# Patient Record
Sex: Female | Born: 1970
Health system: Southern US, Community
[De-identification: ages and names within clinical notes are randomized; demographics above are authoritative.]

## PROBLEM LIST (undated history)

## (undated) DIAGNOSIS — Z79818 Long term (current) use of other agents affecting estrogen receptors and estrogen levels: Secondary | ICD-10-CM

## (undated) DIAGNOSIS — Z8489 Family history of other specified conditions: Secondary | ICD-10-CM

## (undated) DIAGNOSIS — D580 Hereditary spherocytosis: Secondary | ICD-10-CM

## (undated) DIAGNOSIS — D649 Anemia, unspecified: Secondary | ICD-10-CM

## (undated) DIAGNOSIS — Z923 Personal history of irradiation: Secondary | ICD-10-CM

## (undated) DIAGNOSIS — R7303 Prediabetes: Secondary | ICD-10-CM

## (undated) DIAGNOSIS — R0600 Dyspnea, unspecified: Secondary | ICD-10-CM

## (undated) DIAGNOSIS — Z5189 Encounter for other specified aftercare: Secondary | ICD-10-CM

## (undated) DIAGNOSIS — C50911 Malignant neoplasm of unspecified site of right female breast: Secondary | ICD-10-CM

## (undated) DIAGNOSIS — Z9221 Personal history of antineoplastic chemotherapy: Secondary | ICD-10-CM

## (undated) DIAGNOSIS — Z973 Presence of spectacles and contact lenses: Secondary | ICD-10-CM

## (undated) DIAGNOSIS — E079 Disorder of thyroid, unspecified: Secondary | ICD-10-CM

## (undated) DIAGNOSIS — Z79811 Long term (current) use of aromatase inhibitors: Secondary | ICD-10-CM

## (undated) DIAGNOSIS — R55 Syncope and collapse: Secondary | ICD-10-CM

## (undated) HISTORY — DX: Hereditary spherocytosis: D58.0

## (undated) HISTORY — DX: Anemia, unspecified: D64.9

## (undated) HISTORY — DX: Encounter for other specified aftercare: Z51.89

## (undated) HISTORY — PX: BREAST SURGERY: SHX581

## (undated) HISTORY — PX: WISDOM TOOTH EXTRACTION: SHX21

## (undated) HISTORY — DX: Disorder of thyroid, unspecified: E07.9

---
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
## 2000-04-15 ENCOUNTER — Emergency Department (HOSPITAL_COMMUNITY): Admission: EM | Admit: 2000-04-15 | Discharge: 2000-04-16 | Payer: Self-pay | Admitting: Emergency Medicine

## 2000-04-15 ENCOUNTER — Encounter: Payer: Self-pay | Admitting: Emergency Medicine

## 2012-09-02 DIAGNOSIS — C50911 Malignant neoplasm of unspecified site of right female breast: Secondary | ICD-10-CM

## 2012-09-02 HISTORY — DX: Malignant neoplasm of unspecified site of right female breast: C50.911

## 2012-09-06 ENCOUNTER — Other Ambulatory Visit: Payer: Self-pay | Admitting: Radiology

## 2012-09-07 ENCOUNTER — Other Ambulatory Visit: Payer: Self-pay | Admitting: Radiology

## 2012-09-07 DIAGNOSIS — C50911 Malignant neoplasm of unspecified site of right female breast: Secondary | ICD-10-CM

## 2012-09-08 ENCOUNTER — Telehealth: Payer: Self-pay | Admitting: *Deleted

## 2012-09-08 DIAGNOSIS — C50511 Malignant neoplasm of lower-outer quadrant of right female breast: Secondary | ICD-10-CM

## 2012-09-08 NOTE — Telephone Encounter (Signed)
Called and spoke with patient and confirmed [facility] appt. For 09/14/12 at 12N.  Instructions and contact information given.  No questions or concerns at this time.

## 2012-09-09 ENCOUNTER — Ambulatory Visit
Admission: RE | Admit: 2012-09-09 | Discharge: 2012-09-09 | Disposition: A | Discharge status: Home / Self Care | Payer: BC Managed Care – PPO | Source: Ambulatory Visit | Attending: Radiology | Admitting: Radiology

## 2012-09-09 DIAGNOSIS — C50911 Malignant neoplasm of unspecified site of right female breast: Secondary | ICD-10-CM

## 2012-09-09 MED ORDER — GADOBENATE DIMEGLUMINE 529 MG/ML IV SOLN
20.0000 mL | Freq: Once | INTRAVENOUS | Status: AC | PRN
  Administered 2012-09-09: 20 mL via INTRAVENOUS

## 2012-09-14 ENCOUNTER — Encounter: Payer: Self-pay | Admitting: Radiation Oncology

## 2012-09-14 ENCOUNTER — Encounter: Payer: Self-pay | Admitting: *Deleted

## 2012-09-14 ENCOUNTER — Other Ambulatory Visit (HOSPITAL_BASED_OUTPATIENT_CLINIC_OR_DEPARTMENT_OTHER): Payer: BC Managed Care – PPO | Admitting: Lab

## 2012-09-14 ENCOUNTER — Encounter: Payer: Self-pay | Admitting: Oncology

## 2012-09-14 ENCOUNTER — Ambulatory Visit (HOSPITAL_BASED_OUTPATIENT_CLINIC_OR_DEPARTMENT_OTHER): Payer: BC Managed Care – PPO | Admitting: General Surgery

## 2012-09-14 ENCOUNTER — Telehealth: Payer: Self-pay | Admitting: *Deleted

## 2012-09-14 ENCOUNTER — Ambulatory Visit (HOSPITAL_BASED_OUTPATIENT_CLINIC_OR_DEPARTMENT_OTHER): Payer: BC Managed Care – PPO | Admitting: Oncology

## 2012-09-14 ENCOUNTER — Ambulatory Visit: Payer: BC Managed Care – PPO

## 2012-09-14 ENCOUNTER — Encounter (INDEPENDENT_AMBULATORY_CARE_PROVIDER_SITE_OTHER): Payer: Self-pay | Admitting: General Surgery

## 2012-09-14 ENCOUNTER — Ambulatory Visit
Admission: RE | Admit: 2012-09-14 | Discharge: 2012-09-14 | Disposition: A | Discharge status: Home / Self Care | Payer: BC Managed Care – PPO | Source: Ambulatory Visit | Attending: Radiation Oncology | Admitting: Radiation Oncology

## 2012-09-14 VITALS — BP 133/77 | HR 68 | Temp 98.4°F | Resp 20 | Ht 64.5 in | Wt 208.2 lb

## 2012-09-14 DIAGNOSIS — C50511 Malignant neoplasm of lower-outer quadrant of right female breast: Secondary | ICD-10-CM

## 2012-09-14 DIAGNOSIS — C50519 Malignant neoplasm of lower-outer quadrant of unspecified female breast: Secondary | ICD-10-CM

## 2012-09-14 DIAGNOSIS — Z170 Estrogen receptor positive status [ER+]: Secondary | ICD-10-CM

## 2012-09-14 DIAGNOSIS — C50919 Malignant neoplasm of unspecified site of unspecified female breast: Secondary | ICD-10-CM

## 2012-09-14 DIAGNOSIS — D649 Anemia, unspecified: Secondary | ICD-10-CM

## 2012-09-14 LAB — CBC WITH DIFFERENTIAL/PLATELET
BASO%: 0.4 % (ref 0.0–2.0)
Basophils Absolute: 0.1 10*3/uL (ref 0.0–0.1)
EOS%: 1.2 % (ref 0.0–7.0)
Eosinophils Absolute: 0.2 10*3/uL (ref 0.0–0.5)
HCT: 30.3 % — ABNORMAL LOW (ref 34.8–46.6)
HGB: 10.5 g/dL — ABNORMAL LOW (ref 11.6–15.9)
LYMPH%: 16.5 % (ref 14.0–49.7)
MCH: 27.7 pg (ref 25.1–34.0)
MCHC: 34.8 g/dL (ref 31.5–36.0)
MCV: 79.7 fL (ref 79.5–101.0)
MONO#: 0.7 10*3/uL (ref 0.1–0.9)
MONO%: 4.8 % (ref 0.0–14.0)
NEUT#: 11.5 10*3/uL — ABNORMAL HIGH (ref 1.5–6.5)
NEUT%: 77.1 % — ABNORMAL HIGH (ref 38.4–76.8)
Platelets: 270 10*3/uL (ref 145–400)
RBC: 3.8 10*6/uL (ref 3.70–5.45)
RDW: 21.3 % — ABNORMAL HIGH (ref 11.2–14.5)
WBC: 14.9 10*3/uL — ABNORMAL HIGH (ref 3.9–10.3)
lymph#: 2.5 10*3/uL (ref 0.9–3.3)

## 2012-09-14 LAB — COMPREHENSIVE METABOLIC PANEL (CC13)
ALT: 11 U/L (ref 0–55)
AST: 12 U/L (ref 5–34)
Albumin: 3.6 g/dL (ref 3.5–5.0)
Alkaline Phosphatase: 79 U/L (ref 40–150)
BUN: 7.8 mg/dL (ref 7.0–26.0)
CO2: 24 mEq/L (ref 22–29)
Calcium: 8.9 mg/dL (ref 8.4–10.4)
Chloride: 110 mEq/L — ABNORMAL HIGH (ref 98–109)
Creatinine: 0.7 mg/dL (ref 0.6–1.1)
Glucose: 95 mg/dl (ref 70–140)
Potassium: 4 mEq/L (ref 3.5–5.1)
Sodium: 142 mEq/L (ref 136–145)
Total Bilirubin: 0.93 mg/dL (ref 0.20–1.20)
Total Protein: 7.6 g/dL (ref 6.4–8.3)

## 2012-09-14 MED ORDER — LIDOCAINE-PRILOCAINE 2.5-2.5 % EX CREA: TOPICAL_CREAM | CUTANEOUS | Status: DC | PRN

## 2012-09-14 MED ORDER — ONDANSETRON HCL 8 MG PO TABS: 8.0000 mg | ORAL_TABLET | Freq: Two times a day (BID) | ORAL | Status: DC | PRN

## 2012-09-14 MED ORDER — LORAZEPAM 0.5 MG PO TABS: 0.5000 mg | ORAL_TABLET | Freq: Four times a day (QID) | ORAL | Status: DC | PRN

## 2012-09-14 MED ORDER — PROCHLORPERAZINE MALEATE 10 MG PO TABS: 10.0000 mg | ORAL_TABLET | Freq: Four times a day (QID) | ORAL | Status: DC | PRN

## 2012-09-14 MED ORDER — DEXAMETHASONE 4 MG PO TABS: ORAL_TABLET | ORAL | Status: DC

## 2012-09-14 NOTE — Telephone Encounter (Signed)
appts made and printed. Pt is aware that cs will call her with her PET scan appt. She is also aware that i emailed MW to add her tx's. i informed the pt that i needed a time slot for her to see kk on 9/5. i emailed kk asking for her to advise me. i made the pt aware that once her echo is pre cert i will call her w/ an appt d/t. i placed the order on [facility] desk for pre cert.

## 2012-09-14 NOTE — Progress Notes (Signed)
Radiation Oncology         [phone]) [phone] ________________________________  Initial outpatient Consultation  Name: Mary Dougherty MRN: [id number]  Date: 09/14/2012  DOB: 1970-08-20  CC:No PCP Per Patient  Emelia Loron, MD   REFERRING PHYSICIAN: Emelia Loron, MD  DIAGNOSIS: Clinical T2, N0, M0 ER/PR positive HER-2/neu negative grade 2 invasive ductal carcinoma, right breast  HISTORY OF PRESENT ILLNESS::Mary Dougherty is a 42 y.o. female who presented with presented with a 2.0 cm right breast mass on mammography. This measured 1.4 cm on ultrasound. Biopsy on 09/06/2012 demonstrated ER/PR positive HER-2/neu negative grade 2 invasive ductal carcinoma. MRI was performed of her breasts on 09/09/2012, demonstrating a solitary 2.7 cm mass in the right breast.  She is otherwise in her usual state of health.  PREVIOUS RADIATION THERAPY: No  PAST MEDICAL HISTORY:  has a past medical history of Anemia and Thyroid disease.    PAST SURGICAL HISTORY: Past Surgical History  Procedure Laterality Date  . Wisdom tooth extraction      FAMILY HISTORY: family history is not on file.  SOCIAL HISTORY:  reports that she has never smoked. She does not have any smokeless tobacco history on file. She reports that she does not drink alcohol or use illicit drugs.  ALLERGIES: Review of patient's allergies indicates no known allergies.  MEDICATIONS:  Current Outpatient Prescriptions  Medication Sig Dispense Refill  . dexamethasone (DECADRON) 4 MG tablet Take 2 tablets by mouth once a day on the day after chemotherapy and then take 2 tablets two times a day for 2 days. Take with food.  30 tablet  1  . ibuprofen (ADVIL,MOTRIN) 200 MG tablet Take 200 mg by mouth as needed for pain.      Marland Kitchen lidocaine-prilocaine (EMLA) cream Apply topically as needed.  30 g  6  . LORazepam (ATIVAN) 0.5 MG tablet Take 1 tablet (0.5 mg total) by mouth every 6 (six) hours as needed (Nausea or vomiting).  30  tablet  0  . ondansetron (ZOFRAN) 8 MG tablet Take 1 tablet (8 mg total) by mouth 2 (two) times daily as needed. Take two times a day as needed for nausea or vomiting starting on the third day after chemotherapy.  30 tablet  1  . Prenatal Vit-Fe Fumarate-FA (PRENATAL MULTIVITAMIN) TABS tablet Take 1 tablet by mouth daily at 12 noon.      . prochlorperazine (COMPAZINE) 10 MG tablet Take 1 tablet (10 mg total) by mouth every 6 (six) hours as needed (Nausea or vomiting).  30 tablet  1   No current facility-administered medications for this encounter.    REVIEW OF SYSTEMS: As above   PHYSICAL EXAM: Vitals with Age-Percentiles 09/14/2012  Length 163.8 cm  Systolic 133  Diastolic 77  Pulse 68  Respiration 20  Weight 94.439 kg  VISIT REPORT   General: Alert and oriented, in no acute distress HEENT: Head is normocephalic. Extraocular movements are intact.  Neck: Neck is supple, no palpable cervical or supraclavicular lymphadenopathy. Heart: Regular in rate and rhythm with no murmurs, rubs, or gallops. Chest: Rhonchi  in left upper chest - otherwise clear Abdomen: Soft, nontender, nondistended, with no rigidity or guarding. Extremities: No cyanosis or edema. Lymphatics: No concerning lymphadenopathy. Skin: No concerning lesions. Musculoskeletal: symmetric strength and muscle tone throughout. Neurologic: Cranial nerves II through XII are grossly intact. No obvious focalities. Speech is fluent. Coordination is intact. Psychiatric: Judgment and insight are intact. Affect is appropriate. Breasts: Notable for a palpable abnormality in  the lower outer quadrant of the right breast. This is approximately 2-3 cm in size - some of it could be post biopsy change. No palpable lesions in either breasts otherwise or in axillary regions.    LABORATORY DATA:  Lab Results  Component Value Date   WBC 14.9* 09/14/2012   HGB 10.5* 09/14/2012   HCT 30.3* 09/14/2012   MCV 79.7 09/14/2012   PLT 270 09/14/2012    CMP     Component Value Date/Time   NA 142 09/14/2012 1219         RADIOGRAPHY: Mr Breast Bilateral W Wo Contrast  09/09/2012   *RADIOLOGY REPORT*  Clinical Data:Newly-diagnosed right breast cancer six o'clock location  BILATERAL BREAST MRI WITH AND WITHOUT CONTRAST  Technique: Multiplanar, multisequence MR images of both breasts were obtained prior to and following the intravenous administration of 20ml of Multihance.  THREE-DIMENSIONAL MR IMAGE RENDERING ON INDEPENDENT WORKSTATION: Three-dimensional MR images were rendered by post-processing  of the original MR data on an independent DynaCad workstation. The three-dimensional MR images were interpreted, and findings are reported in the following complete MRI report for this study.  Comparison:  [facility] exams  FINDINGS:  Breast composition:  b:  There are scattered areas of fibroglandular density.  Background parenchymal enhancement: Mild to moderate  Right breast:  In the right breast six o'clock location, there is an irregular 2.7 x 2.3 x 1.6 cm mass with plateau type enhancement kinetics demonstrating internal clip artifact, corresponding to the biopsied invasive ductal carcinoma.  Allowing for moderate enhancement pattern, no other area of abnormal enhancement is identified.  Left breast:  No mass or abnormal enhancement.  Lymph nodes:  No abnormal appearing lymph nodes.  Ancillary findings:  None.  IMPRESSION: Solitary irregular enhancing right breast mass 6 o'clock location corresponding to biopsy-proven invasive ductal carcinoma.  No MRI evidence for multifocal/multicentric or contralateral malignancy.  RECOMMENDATION: Treatment plan  BI-RADS CATEGORY 6:  Known biopsy-proven malignancy - appropriate action should be taken.   Original Report Authenticated By: Christiana Pellant, M.D.      IMPRESSION/PLAN: She has been discussed at our multidisciplinary tumor board. The consensus is that she would be a good candidate for breast conservation if  her disease shrinks down adequately with neoadjuvant chemotherapy. I talked to her about the option of a mastectomy and informed her that her expected overall survival would be equivalent between mastectomy and breast conservation, based upon randomized controlled data. She is enthusiastic about breast conservation.   The tentative plan is for her to undergo neoadjuvant chemotherapy, followed by breast conservation surgery (if feasible), followed by radiotherapy. A referral will be made to genetics.  It was a pleasure meeting the patient today. We discussed the risks, benefits, and side effects of radiotherapy. This is a targeted treatment to decrease the risk of a recurrence in the breast tissue by about two thirds . We discussed that radiation would take approximately 6 weeks to complete. We spoke about acute effects including skin irritation and fatigue as well as much less common late effects including lung irritation. We spoke about the latest technology that is used to minimize the risk of late effects for breast cancer patients undergoing radiotherapy. No guarantees of treatment were given. The patient is enthusiastic about proceeding with treatment. I look forward to participating in the patient's care.    I spent 25 minutes  face to face with the patient and more than 50% of that time was spent in counseling and/or coordination of care.  __________________________________________   Lonie Peak, MD

## 2012-09-14 NOTE — Telephone Encounter (Signed)
Per staff message and POF I have scheduled appts.  JMW  

## 2012-09-14 NOTE — Progress Notes (Signed)
Patient ID: Mary Dougherty, female   DOB: 02/11/1970, 42 y.o.   MRN: [id number]  Chief Complaint  Patient presents with  . Other    HPI Mary Dougherty is a 42 y.o. female.  Referred by Dr Rick Cornella HPI  42 yof who lives in [place] who palpated a right breast mass on her self exam.  Mary Dougherty underwent mm that showed a right breast abnormality that was followed by u/s showing a 1.4 cm right breast mass. MRI then showed 2.7x2.3.1.6 cm mass with no other abnormalities.  Biopsy showed a grade I IDC that is er/pr positive, her2 negative and Ki is 70%.  Mary Dougherty comes in today to discuss her options.  Mary Dougherty has no real complaints.  Past Medical History  Diagnosis Date  . Anemia   . Thyroid disease     Past Surgical History  Procedure Laterality Date  . Wisdom tooth extraction      History reviewed. No pertinent family history.  Social History History  Substance Use Topics  . Smoking status: Never Smoker   . Smokeless tobacco: Not on file  . Alcohol Use: No    No Known Allergies  Current Outpatient Prescriptions  Medication Sig Dispense Refill  . dexamethasone (DECADRON) 4 MG tablet Take 2 tablets by mouth once a day on the day after chemotherapy and then take 2 tablets two times a day for 2 days. Take with food.  30 tablet  1  . ibuprofen (ADVIL,MOTRIN) 200 MG tablet Take 200 mg by mouth as needed for pain.      . lidocaine-prilocaine (EMLA) cream Apply topically as needed.  30 g  6  . LORazepam (ATIVAN) 0.5 MG tablet Take 1 tablet (0.5 mg total) by mouth every 6 (six) hours as needed (Nausea or vomiting).  30 tablet  0  . ondansetron (ZOFRAN) 8 MG tablet Take 1 tablet (8 mg total) by mouth 2 (two) times daily as needed. Take two times a day as needed for nausea or vomiting starting on the third day after chemotherapy.  30 tablet  1  . Prenatal Vit-Fe Fumarate-FA (PRENATAL MULTIVITAMIN) TABS tablet Take 1 tablet by mouth daily at 12 noon.      . prochlorperazine  (COMPAZINE) 10 MG tablet Take 1 tablet (10 mg total) by mouth every 6 (six) hours as needed (Nausea or vomiting).  30 tablet  1   No current facility-administered medications for this visit.    Review of Systems Review of Systems  Constitutional: Negative for fever, chills and unexpected weight change.  HENT: Negative for hearing loss, congestion, sore throat, trouble swallowing and voice change.   Eyes: Negative for visual disturbance.  Respiratory: Negative for cough and wheezing.   Cardiovascular: Negative for chest pain, palpitations and leg swelling.  Gastrointestinal: Negative for nausea, vomiting, abdominal pain, diarrhea, constipation, blood in stool, abdominal distention and anal bleeding.  Genitourinary: Negative for hematuria, vaginal bleeding and difficulty urinating.  Musculoskeletal: Negative for arthralgias.  Skin: Negative for rash and wound.  Neurological: Negative for seizures, syncope and headaches.  Hematological: Negative for adenopathy. Does not bruise/bleed easily.  Psychiatric/Behavioral: Negative for confusion.    There were no vitals taken for this visit.  Physical Exam Physical Exam  Vitals reviewed. Constitutional: Mary Dougherty appears well-developed and well-nourished.  Eyes: No scleral icterus.  Neck: Neck supple.  Cardiovascular: Normal rate, regular rhythm and normal heart sounds.   Pulmonary/Chest: Effort normal and breath sounds normal. Mary Dougherty has no wheezes. Mary Dougherty has no rales.   Right breast exhibits mass. Right breast exhibits no inverted nipple, no nipple discharge, no skin change and no tenderness. Left breast exhibits no inverted nipple, no mass, no nipple discharge, no skin change and no tenderness.    Abdominal: Soft. There is no tenderness.  Lymphadenopathy:    Mary Dougherty has no cervical adenopathy.    Data Reviewed BILATERAL BREAST MRI WITH AND WITHOUT CONTRAST  Technique: Multiplanar, multisequence MR images of both breasts  were obtained prior to and  following the intravenous administration  of 20ml of Multihance.  THREE-DIMENSIONAL MR IMAGE RENDERING ON INDEPENDENT WORKSTATION:  Three-dimensional MR images were rendered by post-processing of  the original MR data on an independent DynaCad workstation. The  three-dimensional MR images were interpreted, and findings are  reported in the following complete MRI report for this study.  Comparison: [facility] exams  FINDINGS:  Breast composition: b: There are scattered areas of  fibroglandular density.  Background parenchymal enhancement: Mild to moderate  Right breast: In the right breast six o'clock location, there is  an irregular 2.7 x 2.3 x 1.6 cm mass with plateau type enhancement  kinetics demonstrating internal clip artifact, corresponding to the  biopsied invasive ductal carcinoma. Allowing for moderate  enhancement pattern, no other area of abnormal enhancement is  identified.  Left breast: No mass or abnormal enhancement.  Lymph nodes: No abnormal appearing lymph nodes.  Ancillary findings: None.  IMPRESSION:  Solitary irregular enhancing right breast mass 6 o'clock location  corresponding to biopsy-proven invasive ductal carcinoma. No MRI  evidence for multifocal/multicentric or contralateral malignancy.    Assessment    Right breast cancer     Plan    Primary chemotherapy, port placement    Mary Dougherty is close to being lumpectomy candidate now but I think we could improve our ability to perform breast conservation therapy and give her better results and possibly less likelihood of positive margins by going with primary systemic therapy first.  Mary Dougherty is agreeable to that.  We discussed port placment along with risks/benefits of that.  We will plan on mr at end of therapy and then surgery about 3 weeks after that. We discussed the staging and pathophysiology of breast cancer. We discussed all of the different options for treatment for breast cancer including surgery,  chemotherapy, radiation therapy, Herceptin, and antiestrogen therapy.   We discussed a sentinel lymph node biopsy at the time of surgery as Mary Dougherty does not appear to having lymph node involvement right now. We discussed the performance of that with injection of radioactive tracer and blue dye. We discussed that Mary Dougherty would have an incision underneath her axillary hairline. We discussed that there is a chance of having a positive node with a sentinel lymph node biopsy and we will await the permanent pathology to make any other first further decisions in terms of her treatment. One of these options might be to return to the operating room to perform an axillary lymph node dissection. We discussed about a 1-2% risk lifetime of chronic shoulder pain as well as lymphedema associated with a sentinel lymph node biopsy.  We discussed the options for treatment of the breast cancer which included lumpectomy versus a mastectomy. We discussed the performance of the lumpectomy with a wire placement. We discussed a 10-20% chance of a positive margin requiring reexcision in the operating room. We also discussed that Mary Dougherty may need radiation therapy or antiestrogen therapy or both if Mary Dougherty undergoes lumpectomy. We discussed the mastectomy and the postoperative care for that   as well. We discussed that there is no difference in her survival whether Mary Dougherty undergoes lumpectomy with radiation therapy or antiestrogen therapy versus a mastectomy. There is a slight difference in the local recurrence rate being 3-5% with lumpectomy and about 1% with a mastectomy. I do think we can do breast conservation therapy better with primary systemic therapy with no downside or detrimental effect. We discussed the risks of operation including bleeding, infection, possible reoperation. Mary Dougherty understands her further therapy will be based on what her stages at the time of her operation.     Mary Dougherty 09/14/2012, 4:42 PM    

## 2012-09-14 NOTE — Progress Notes (Signed)
Checked in new pt with no financial concerns. °

## 2012-09-14 NOTE — Progress Notes (Signed)
Mary Dougherty [id number] 08/14/1970 42 y.o. 09/14/2012 3:12 PM  CC  No PCP Per Patient [address] [place] [address] Dr. Emelia Loron Dr. Lonie Peak  REASON FOR CONSULTATION:  42 year old female with screening detected in the right invasive ductal carcinoma. Patient is being seen in the multidisciplinary breast clinic for discussion of treatment options.  STAGE:   Breast cancer of lower-outer quadrant of right female breast   Primary site: Breast (Right)   Staging method: AJCC 7th Edition   Clinical: Stage IIA (T2, N0, cM0)   Summary: Stage IIA (T2, N0, cM0)  REFERRING PHYSICIAN: Dr. Emelia Loron  HISTORY OF PRESENT ILLNESS:  Mary Dougherty is a 42 y.o. female.  Would medical history significant for anemia. Patient went for her initial screening mammogram at the insistence of her sister. She was found to have a mass in the right breast. The ultrasound demonstrated a 1.4 cm area of concern. She had an MRI performed that revealed a 2.7 cm irregular area at the 6:00 location. There were no masses in the left breast no abnormal lymph nodes. Patient had a biopsy performed of the right breast that revealed an intermediate to high-grade invasive ductal carcinoma the tumor was ER +99% PR +100% HER-2/neu negative with a Ki-67 of 70%. Patient's case was discussed at the multidisciplinary breast clinic and conference. She was seen by Dr. Dwain Sarna and Dr. Lonie Peak as well. Her radiology and pathology were discussed. She is without any complaints   Past Medical History: Past Medical History  Diagnosis Date  . Anemia   . Thyroid disease     Past Surgical History: Past Surgical History  Procedure Laterality Date  . Wisdom tooth extraction      Family History: History reviewed. No pertinent family history.  Social History History  Substance Use Topics  . Smoking status: Never Smoker   . Smokeless tobacco: Not on file  . Alcohol Use: No     Allergies: No Known Allergies  Current Medications: Current Outpatient Prescriptions  Medication Sig Dispense Refill  . ibuprofen (ADVIL,MOTRIN) 200 MG tablet Take 200 mg by mouth as needed for pain.      . Prenatal Vit-Fe Fumarate-FA (PRENATAL MULTIVITAMIN) TABS tablet Take 1 tablet by mouth daily at 12 noon.       No current facility-administered medications for this visit.    OB/GYN History:patient had menarche at age 1 she and her last menstrual cycle was on 08/26/2012. She is nulliparous  Fertility Discussion: not applicable Prior History of Cancer: no  Health Maintenance:  Colonoscopy no Bone Density  Last PAP smearyes unknown  ECOG PERFORMANCE STATUS: 0 - Asymptomatic  Genetic Counseling/testing: yes  REVIEW OF SYSTEMS:  Review of Systems  Constitutional: Negative.   HENT: Negative.   Eyes: Negative.   Respiratory: Negative.   Cardiovascular: Negative.   Gastrointestinal: Negative.   Genitourinary: Negative.   Musculoskeletal: Negative.   Skin: Negative.   Neurological: Negative.   Endo/Heme/Allergies: Negative.   Psychiatric/Behavioral: Negative.      PHYSICAL EXAMINATION: Blood pressure 133/77, pulse 68, temperature 98.4 F (36.9 C), temperature source Oral, resp. rate 20, height 5' 4.5" (1.638 m), weight 208 lb 3.2 oz (94.439 kg). Physical Exam  Constitutional: She is oriented to person, place, and time and well-developed, well-nourished, and in no distress. No distress.  HENT:  Head: Normocephalic and atraumatic.  Nose: Nose normal.  Mouth/Throat: Oropharynx is clear and moist.  Eyes: EOM are normal. Pupils are equal, round, and reactive  to light.  Neck: Normal range of motion. Neck supple. No JVD present. No thyromegaly present.  Cardiovascular: Normal rate, regular rhythm and normal heart sounds.   Pulmonary/Chest: Effort normal. No respiratory distress. She has wheezes. She has no rales. She exhibits no tenderness.  Abdominal: Soft. Bowel  sounds are normal. She exhibits no distension and no mass. There is no tenderness.  Musculoskeletal: Normal range of motion. She exhibits no edema and no tenderness.  Lymphadenopathy:    She has no cervical adenopathy.  Neurological: She is alert and oriented to person, place, and time. She has normal reflexes. Gait normal.  Skin: Skin is warm and dry. No rash noted. No pallor.  Psychiatric: Mood and affect normal.  breast examination right breast reveals palpable mass no nipple inversion retraction left breast no masses or nipple discharge     STUDIES/RESULTS: Mr Breast Bilateral W Wo Contrast  09/09/2012   *RADIOLOGY REPORT*  Clinical Data:Newly-diagnosed right breast cancer six o'clock location  BILATERAL BREAST MRI WITH AND WITHOUT CONTRAST  Technique: Multiplanar, multisequence MR images of both breasts were obtained prior to and following the intravenous administration of 20ml of Multihance.  THREE-DIMENSIONAL MR IMAGE RENDERING ON INDEPENDENT WORKSTATION: Three-dimensional MR images were rendered by post-processing  of the original MR data on an independent DynaCad workstation. The three-dimensional MR images were interpreted, and findings are reported in the following complete MRI report for this study.  Comparison:  [facility] exams  FINDINGS:  Breast composition:  b:  There are scattered areas of fibroglandular density.  Background parenchymal enhancement: Mild to moderate  Right breast:  In the right breast six o'clock location, there is an irregular 2.7 x 2.3 x 1.6 cm mass with plateau type enhancement kinetics demonstrating internal clip artifact, corresponding to the biopsied invasive ductal carcinoma.  Allowing for moderate enhancement pattern, no other area of abnormal enhancement is identified.  Left breast:  No mass or abnormal enhancement.  Lymph nodes:  No abnormal appearing lymph nodes.  Ancillary findings:  None.  IMPRESSION: Solitary irregular enhancing right breast mass 6  o'clock location corresponding to biopsy-proven invasive ductal carcinoma.  No MRI evidence for multifocal/multicentric or contralateral malignancy.  RECOMMENDATION: Treatment plan  BI-RADS CATEGORY 6:  Known biopsy-proven malignancy - appropriate action should be taken.   Original Report Authenticated By: Christiana Pellant, M.D.     LABS:    Chemistry      Component Value Date/Time   NA 142 09/14/2012 1219   K 4.0 09/14/2012 1219   CO2 24 09/14/2012 1219   BUN 7.8 09/14/2012 1219   CREATININE 0.7 09/14/2012 1219      Component Value Date/Time   CALCIUM 8.9 09/14/2012 1219   ALKPHOS 79 09/14/2012 1219   AST 12 09/14/2012 1219   ALT 11 09/14/2012 1219   BILITOT 0.93 09/14/2012 1219      Lab Results  Component Value Date   WBC 14.9* 09/14/2012   HGB 10.5* 09/14/2012   HCT 30.3* 09/14/2012   MCV 79.7 09/14/2012   PLT 270 09/14/2012   PATHOLOGY: ADDITIONAL INFORMATION: PROGNOSTIC INDICATORS - ACIS Results: IMMUNOHISTOCHEMICAL AND MORPHOMETRIC ANALYSIS BY THE AUTOMATED CELLULAR IMAGING SYSTEM (ACIS) Estrogen Receptor: 99%, POSITIVE, STRONG STAINING INTENSITY Progesterone Receptor: 100%, POSITIVE, STRONG STAINING INTENSITY Proliferation Marker Ki67: 70% REFERENCE RANGE ESTROGEN RECEPTOR NEGATIVE <1% POSITIVE =>1% PROGESTERONE RECEPTOR NEGATIVE <1% POSITIVE =>1% All controls stained appropriately Jimmy Picket MD Pathologist, Electronic Signature ( Signed 09/09/2012) CHROMOGENIC IN-SITU HYBRIDIZATION Results: HER-2/NEU BY CISH - NO AMPLIFICATION OF HER-2 DETECTED.  RESULT RATIO OF HER2: CEP 17 SIGNALS 1.14 AVERAGE HER2 COPY NUMBER PER CELL 2.45 REFERENCE RANGE 1 of 3 Duplicate copy FINAL for ALLORA, BAINS ([id number]) ADDITIONAL INFORMATION:(continued) NEGATIVE HER2/Chr17 Ratio <2.0 and Average HER2 copy number <4.0 EQUIVOCAL HER2/Chr17 Ratio <2.0 and Average HER2 copy number 4.0 and <6.0 POSITIVE HER2/Chr17 Ratio >=2.0 and/or Average HER2 copy number >=6.0 Jimmy Picket  MD Pathologist, Electronic Signature ( Signed 09/09/2012) FINAL DIAGNOSIS Diagnosis Breast, right, needle core biopsy, mass - INVASIVE DUCTAL CARCINOMA. Microscopic Comment The cores are involved by invasive ductal carcinoma consistent with grade II/III. Breast prognostic profile will be performed. Called to [employer] on 09/07/12. Dr. Colonel Bald has reviewed this case and agrees. (JDP:gt, 09/07/12) Jimmy Picket MD Pathologist, Electronic Signature (Case signed 09/07/2012) S ASSESSMENT    42 year old female with   #1 initial yearly mammogramfound to have an abnormal mass in the right breast ultrasound confirmed the mass to be 1.4 cm at the 6:00 position MRI revealed the mass to be a little bit bigger at 2.7 cm. Biopsy revealed an invasive ductal carcinoma grade 2 ER positive PR positive HER-2/neu negative with a Ki-67 of 70%.  #2 patient and I discussed her diagnosis radiology pathology. We discussed treatment options.  #3 patient does desire breast conservation we did discuss doing neoadjuvant chemotherapy initially consisting of FEC q. 2 weeks for a total of 6 cycles followed by Taxol weekly for a total of 12 weeks. Risks benefits and side effects of this treatment were discussed with the patient.  #4 patient will need antiestrogen therapy since her tumor is estrogen receptor positive. We discussed tamoxifen for 10 years.  #5 patient will also need radiation therapy she was seen by Dr. Lonie Peak appeared  Clinical Trial Eligibility:no Multidisciplinary conference discussionyes     PLAN:    #1 patient will proceed with genetic counseling and testing.  #2 she will need a Port-A-Cath placement for chemotherapy.  #3 we will do staging CT chest abdomen and pelvis to make sure there is no evidence or disease anywhere else.  #4 patient will need an echocardiogram chemotherapy teaching class.  #5 she will receive FEC dose dense every 2 weeks for a total of 6 cycles  followed by weekly Taxol for 12 weeks. We hope to start this chemotherapy in the next few weeks.        Discussion: Patient is being treated per NCCN breast cancer care guidelines appropriate for stage.II   Thank you so much for allowing me to participate in the care of [name]. I will continue to follow up the patient with you and assist in her care.  All questions were answered. The patient knows to call the clinic with any problems, questions or concerns. We can certainly see the patient much sooner if necessary.  I spent 55 minutes counseling the patient face to face. The total time spent in the appointment was 60 minutes.  Drue Second, MD Medical/Oncology [facility] [phone] (beeper) [phone] (Office)  09/14/2012, 3:12 PM

## 2012-09-15 ENCOUNTER — Telehealth (INDEPENDENT_AMBULATORY_CARE_PROVIDER_SITE_OTHER): Payer: Self-pay

## 2012-09-15 ENCOUNTER — Encounter (HOSPITAL_BASED_OUTPATIENT_CLINIC_OR_DEPARTMENT_OTHER): Payer: Self-pay | Admitting: *Deleted

## 2012-09-15 ENCOUNTER — Telehealth: Payer: Self-pay | Admitting: *Deleted

## 2012-09-15 NOTE — Telephone Encounter (Signed)
Tried to call the pt and informed her that KK gv a time slot. And to make her aware that on 10/07/12 she will come in @ 9:45am for labs, ov@10 :15am, and tx to follow. i emailed MW to adjust the tx time for that date. Pt will see these changes on Monday 09/19/12.

## 2012-09-15 NOTE — Telephone Encounter (Signed)
sw with pt gv appt for echo 09/19/12 @ 2pm. i also made her aware of her appts for 10/07/12..the patient is aware...td

## 2012-09-15 NOTE — Progress Notes (Signed)
Labs done cancer center 09/14/12-wbc up-no s/s infection-sore from bx

## 2012-09-15 NOTE — Telephone Encounter (Signed)
Called pt to introduce myself since pt was seen at the [facility] with Dr Dwain Sarna. I advised pt that our surgery schedulers are working on getting her scheduled for the [facility] next week and they should be in touch with her today. I advised pt that if she had any questions to please call our office. I gave the pt our phone number with my name.

## 2012-09-15 NOTE — Telephone Encounter (Signed)
Patient is having PAC placement on 8/19 @ 1030 by Dr Dwain Sarna and will need Chemo Ed rescheduled to a later date. Patient notified we will be changing this appt. If she does not hear from [place] tomorrow, she will check while here for appt with Maylon Cos on Monday.

## 2012-09-16 ENCOUNTER — Telehealth: Payer: Self-pay | Admitting: *Deleted

## 2012-09-16 NOTE — Telephone Encounter (Signed)
Per staff message I have adjusted 9/5 appt 

## 2012-09-16 NOTE — Telephone Encounter (Signed)
sw pt r/s her appt for 8/19 to 10/22/12 @ 10am. Pt is aware...td

## 2012-09-19 ENCOUNTER — Ambulatory Visit (HOSPITAL_COMMUNITY): Payer: BC Managed Care – PPO | Attending: Oncology | Admitting: Radiology

## 2012-09-19 ENCOUNTER — Other Ambulatory Visit: Payer: BC Managed Care – PPO

## 2012-09-19 ENCOUNTER — Telehealth: Payer: Self-pay | Admitting: *Deleted

## 2012-09-19 ENCOUNTER — Encounter: Payer: Self-pay | Admitting: Genetic Counselor

## 2012-09-19 ENCOUNTER — Ambulatory Visit (HOSPITAL_BASED_OUTPATIENT_CLINIC_OR_DEPARTMENT_OTHER): Payer: BC Managed Care – PPO | Admitting: Genetic Counselor

## 2012-09-19 DIAGNOSIS — Z808 Family history of malignant neoplasm of other organs or systems: Secondary | ICD-10-CM

## 2012-09-19 DIAGNOSIS — Z8249 Family history of ischemic heart disease and other diseases of the circulatory system: Secondary | ICD-10-CM | POA: Insufficient documentation

## 2012-09-19 DIAGNOSIS — C50919 Malignant neoplasm of unspecified site of unspecified female breast: Secondary | ICD-10-CM | POA: Insufficient documentation

## 2012-09-19 DIAGNOSIS — IMO0002 Reserved for concepts with insufficient information to code with codable children: Secondary | ICD-10-CM

## 2012-09-19 DIAGNOSIS — E669 Obesity, unspecified: Secondary | ICD-10-CM | POA: Insufficient documentation

## 2012-09-19 DIAGNOSIS — I428 Other cardiomyopathies: Secondary | ICD-10-CM

## 2012-09-19 DIAGNOSIS — C50511 Malignant neoplasm of lower-outer quadrant of right female breast: Secondary | ICD-10-CM

## 2012-09-19 DIAGNOSIS — Z803 Family history of malignant neoplasm of breast: Secondary | ICD-10-CM

## 2012-09-19 NOTE — Progress Notes (Signed)
Dr.  Drue Second requested a consultation for genetic counseling and risk assessment for Mary Dougherty, a 42 y.o. female, for discussion of her personal history of breast cancer and remote family history of melanoma and breast cancer.  She presents to clinic today to discuss the possibility of a genetic predisposition to cancer, and to further clarify her risks, as well as her family members' risks for cancer.   HISTORY OF PRESENT ILLNESS: In 2014, at the age of 49, Mary Dougherty was diagnosed with invasive ductal carcinoma of the right breast. This will be treated with lumpectomy, chemotherapy and radiation.  The tumor is ER+/PR+/Her2-.  Marland Kitchen     Past Medical History  Diagnosis Date  . Anemia   . Thyroid disease   . Wears glasses   . Breast cancer 2014    ER+/PR+/Her2-    Past Surgical History  Procedure Laterality Date  . Wisdom tooth extraction      History   Social History  . Marital Status: Single    Spouse Name: N/A    Number of Children: N/A  . Years of Education: N/A   Social History Main Topics  . Smoking status: Never Smoker   . Smokeless tobacco: None  . Alcohol Use: No  . Drug Use: No  . Sexual Activity: Not Currently   Other Topics Concern  . None   Social History Narrative  . None    REPRODUCTIVE HISTORY AND PERSONAL RISK ASSESSMENT FACTORS: Menarche was at age 15.   premenopausal Uterus Intact: no Ovaries Intact: no G0P0A0, first live birth at age N/A  She has not previously undergone treatment for infertility.   Oral Contraceptive use: 8-10 years   She has not used HRT in the past.    FAMILY HISTORY:  We obtained a detailed, 4-generation family history.  Significant diagnoses are listed below: Family History  Problem Relation Age of Onset  . Pulmonary embolism Maternal Uncle 45    died instantly  . Melanoma Cousin     paternal cousin dx in her 91s  The patient's maternal grandmother had several siblings who passed away from  smoking related cancers.  Specifically two sisters who had lung cancer, a brotehr with throat cancer and a brother with either pancreatic or colon cancer.  The patient's paternal uncle had "cancer of the ear lobe" in his 30s.  She was unsure if this was a melanoma or a cancer of the tissue of the ear lobe.  No chemo or radiation was needed.  Patient's maternal ancestors are of [place] descent, and paternal ancestors are of [place] descent. There is no reported Ashkenazi Jewish ancestry. There is no  known consanguinity.  GENETIC COUNSELING ASSESSMENT: Mary Dougherty is a 42 y.o. female with a personal history of breast cancer and remote family history of breast cancer and melanoma which somewhat suggestive of a hereditary  and predisposition to cancer. We, therefore, discussed and recommended the following at today's visit.   DISCUSSION: We reviewed the characteristics, features and inheritance patterns of hereditary cancer syndromes. We also discussed genetic testing, including the appropriate family members to test, the process of testing, insurance coverage and turn-around-time for results. We recommended Mary Dougherty pursue genetic testing for breast/ovarian cancer panel at [facility].   PLAN: After considering the risks, benefits, and limitations, Mary Dougherty provided informed consent to pursue genetic testing and the blood sample will be sent to [facility] for analysis of the Breast/ovarian cancer  panel. We discussed the implications of a positive, negative and/ or variant of uncertain significance genetic test result. Results should be available within approximately 3-4 weeks' time, at which point they will be disclosed by telephone to Mary Dougherty, as will any additional recommendations warranted by these results. Mary Dougherty will receive a summary of her genetic counseling visit and a copy of her results once available. This information will  also be available in Epic. We encouraged Mary Dougherty to remain in contact with cancer genetics annually so that we can continuously update the family history and inform her of any changes in cancer genetics and testing that may be of benefit for her family. Mary Dougherty's questions were answered to her satisfaction today. Our contact information was provided should additional questions or concerns arise.   Per the patient's request, we will contact her by telephone to discuss these results. A follow up genetic counseling visit will be scheduled if indicated.  The patient was seen for a total of 60 minutes, greater than 50% of which was spent face-to-face counseling.  This plan is being carried out per Dr. Feliz Beam recommendations.  This note will also be sent to the referring provider via the electronic medical record. The patient will be supplied with a summary of this genetic counseling discussion as well as educational information on the discussed hereditary cancer syndromes following the conclusion of their visit.   Patient was discussed with Dr. Drue Second.   _______________________________________________________________________ For Office Staff:  Number of people involved in session: 3 Was an Intern/ student involved with case: yes

## 2012-09-19 NOTE — Telephone Encounter (Signed)
Called and spoke with patient from [facility] 09/14/12. No questions or concerns at this time.   

## 2012-09-19 NOTE — Progress Notes (Signed)
Echocardiogram performed.  

## 2012-09-20 ENCOUNTER — Other Ambulatory Visit: Payer: BC Managed Care – PPO

## 2012-09-20 ENCOUNTER — Encounter (HOSPITAL_BASED_OUTPATIENT_CLINIC_OR_DEPARTMENT_OTHER): Payer: Self-pay

## 2012-09-20 ENCOUNTER — Ambulatory Visit (HOSPITAL_BASED_OUTPATIENT_CLINIC_OR_DEPARTMENT_OTHER): Payer: BC Managed Care – PPO | Admitting: Anesthesiology

## 2012-09-20 ENCOUNTER — Encounter (HOSPITAL_BASED_OUTPATIENT_CLINIC_OR_DEPARTMENT_OTHER): Payer: Self-pay | Admitting: Anesthesiology

## 2012-09-20 ENCOUNTER — Other Ambulatory Visit: Payer: Self-pay | Admitting: Certified Registered Nurse Anesthetist

## 2012-09-20 ENCOUNTER — Ambulatory Visit (HOSPITAL_COMMUNITY): Payer: BC Managed Care – PPO

## 2012-09-20 ENCOUNTER — Encounter (HOSPITAL_BASED_OUTPATIENT_CLINIC_OR_DEPARTMENT_OTHER)
Admission: RE | Disposition: A | Discharge status: Home / Self Care | Payer: Self-pay | Source: Ambulatory Visit | Attending: General Surgery

## 2012-09-20 ENCOUNTER — Ambulatory Visit (HOSPITAL_BASED_OUTPATIENT_CLINIC_OR_DEPARTMENT_OTHER)
Admission: RE | Admit: 2012-09-20 | Discharge: 2012-09-20 | Disposition: A | Discharge status: Home / Self Care | Payer: BC Managed Care – PPO | Source: Ambulatory Visit | Attending: General Surgery | Admitting: General Surgery

## 2012-09-20 DIAGNOSIS — C50919 Malignant neoplasm of unspecified site of unspecified female breast: Secondary | ICD-10-CM

## 2012-09-20 DIAGNOSIS — Z79899 Other long term (current) drug therapy: Secondary | ICD-10-CM | POA: Insufficient documentation

## 2012-09-20 DIAGNOSIS — Z170 Estrogen receptor positive status [ER+]: Secondary | ICD-10-CM | POA: Insufficient documentation

## 2012-09-20 DIAGNOSIS — E079 Disorder of thyroid, unspecified: Secondary | ICD-10-CM | POA: Insufficient documentation

## 2012-09-20 DIAGNOSIS — D649 Anemia, unspecified: Secondary | ICD-10-CM | POA: Insufficient documentation

## 2012-09-20 HISTORY — DX: Presence of spectacles and contact lenses: Z97.3

## 2012-09-20 HISTORY — PX: PORTACATH PLACEMENT: SHX2246

## 2012-09-20 SURGERY — INSERTION, TUNNELED CENTRAL VENOUS DEVICE, WITH PORT
Anesthesia: General | Site: Chest | Laterality: Left | Wound class: Clean

## 2012-09-20 MED ORDER — HEPARIN SOD (PORK) LOCK FLUSH 100 UNIT/ML IV SOLN
INTRAVENOUS | Status: DC | PRN
  Administered 2012-09-20: 500 [IU] via INTRAVENOUS

## 2012-09-20 MED ORDER — MIDAZOLAM HCL 2 MG/2ML IJ SOLN: 1.0000 mg | INTRAMUSCULAR | Status: DC | PRN

## 2012-09-20 MED ORDER — BUPIVACAINE HCL (PF) 0.25 % IJ SOLN
INTRAMUSCULAR | Status: DC | PRN
  Administered 2012-09-20: 10 mL

## 2012-09-20 MED ORDER — LACTATED RINGERS IV SOLN
INTRAVENOUS | Status: DC
  Administered 2012-09-20: 20 mL/h via INTRAVENOUS

## 2012-09-20 MED ORDER — FENTANYL CITRATE 0.05 MG/ML IJ SOLN
INTRAMUSCULAR | Status: DC | PRN
  Administered 2012-09-20 (×2): 50 ug via INTRAVENOUS

## 2012-09-20 MED ORDER — DEXAMETHASONE SODIUM PHOSPHATE 4 MG/ML IJ SOLN
INTRAMUSCULAR | Status: DC | PRN
  Administered 2012-09-20: 10 mg via INTRAVENOUS

## 2012-09-20 MED ORDER — FENTANYL CITRATE 0.05 MG/ML IJ SOLN: 50.0000 ug | INTRAMUSCULAR | Status: DC | PRN

## 2012-09-20 MED ORDER — MIDAZOLAM HCL 5 MG/5ML IJ SOLN
INTRAMUSCULAR | Status: DC | PRN
  Administered 2012-09-20: 2 mg via INTRAVENOUS

## 2012-09-20 MED ORDER — PROPOFOL 10 MG/ML IV BOLUS
INTRAVENOUS | Status: DC | PRN
  Administered 2012-09-20: 200 mg via INTRAVENOUS

## 2012-09-20 MED ORDER — LIDOCAINE HCL (CARDIAC) 20 MG/ML IV SOLN
INTRAVENOUS | Status: DC | PRN
  Administered 2012-09-20: 50 mg via INTRAVENOUS

## 2012-09-20 MED ORDER — OXYCODONE-ACETAMINOPHEN 10-325 MG PO TABS: 1.0000 | ORAL_TABLET | Freq: Four times a day (QID) | ORAL | Status: DC | PRN

## 2012-09-20 MED ORDER — ONDANSETRON HCL 4 MG/2ML IJ SOLN
INTRAMUSCULAR | Status: DC | PRN
  Administered 2012-09-20: 4 mg via INTRAVENOUS

## 2012-09-20 MED ORDER — HEPARIN (PORCINE) IN NACL 2-0.9 UNIT/ML-% IJ SOLN
INTRAMUSCULAR | Status: DC | PRN
  Administered 2012-09-20: 1 via INTRAVENOUS

## 2012-09-20 MED ORDER — HYDROMORPHONE HCL PF 1 MG/ML IJ SOLN
0.2500 mg | INTRAMUSCULAR | Status: DC | PRN
  Administered 2012-09-20: 0.5 mg via INTRAVENOUS

## 2012-09-20 MED ORDER — CEFAZOLIN SODIUM-DEXTROSE 2-3 GM-% IV SOLR
2.0000 g | INTRAVENOUS | Status: AC
  Administered 2012-09-20: 2 g via INTRAVENOUS

## 2012-09-20 SURGICAL SUPPLY — 49 items
ADH SKN CLS APL DERMABOND .7 (GAUZE/BANDAGES/DRESSINGS) ×1
APL SKNCLS STERI-STRIP NONHPOA (GAUZE/BANDAGES/DRESSINGS) ×1
BAG DECANTER FOR FLEXI CONT (MISCELLANEOUS) ×2 IMPLANT
BENZOIN TINCTURE PRP APPL 2/3 (GAUZE/BANDAGES/DRESSINGS) ×2 IMPLANT
BLADE SURG 11 STRL SS (BLADE) ×2 IMPLANT
BLADE SURG 15 STRL LF DISP TIS (BLADE) ×1 IMPLANT
BLADE SURG 15 STRL SS (BLADE) ×2
CANISTER SUCTION 1200CC (MISCELLANEOUS) IMPLANT
CHLORAPREP W/TINT 26ML (MISCELLANEOUS) ×2 IMPLANT
CLOTH BEACON ORANGE TIMEOUT ST (SAFETY) ×2 IMPLANT
COVER MAYO STAND STRL (DRAPES) ×2 IMPLANT
COVER TABLE BACK 60X90 (DRAPES) ×2 IMPLANT
DECANTER SPIKE VIAL GLASS SM (MISCELLANEOUS) IMPLANT
DERMABOND ADVANCED (GAUZE/BANDAGES/DRESSINGS) ×1
DERMABOND ADVANCED .7 DNX12 (GAUZE/BANDAGES/DRESSINGS) ×1 IMPLANT
DRAPE C-ARM 42X72 X-RAY (DRAPES) ×2 IMPLANT
DRAPE LAPAROSCOPIC ABDOMINAL (DRAPES) ×2 IMPLANT
DRSG TEGADERM 4X4.75 (GAUZE/BANDAGES/DRESSINGS) IMPLANT
ELECT COATED BLADE 2.86 ST (ELECTRODE) ×2 IMPLANT
ELECT REM PT RETURN 9FT ADLT (ELECTROSURGICAL) ×2
ELECTRODE REM PT RTRN 9FT ADLT (ELECTROSURGICAL) ×1 IMPLANT
GAUZE SPONGE 4X4 12PLY STRL LF (GAUZE/BANDAGES/DRESSINGS) ×2 IMPLANT
GLOVE BIO SURGEON STRL SZ7 (GLOVE) ×4 IMPLANT
GLOVE BIOGEL PI IND STRL 7.5 (GLOVE) ×1 IMPLANT
GLOVE BIOGEL PI INDICATOR 7.5 (GLOVE) ×1
GOWN PREVENTION PLUS XLARGE (GOWN DISPOSABLE) ×4 IMPLANT
IV KIT MINILOC 20X1 SAFETY (NEEDLE) IMPLANT
KIT PORT POWER 8FR ISP CVUE (Catheter) ×1 IMPLANT
NDL HYPO 25X1 1.5 SAFETY (NEEDLE) ×1 IMPLANT
NDL SAFETY ECLIPSE 18X1.5 (NEEDLE) IMPLANT
NEEDLE HYPO 18GX1.5 SHARP (NEEDLE)
NEEDLE HYPO 25X1 1.5 SAFETY (NEEDLE) ×2 IMPLANT
PACK BASIN DAY SURGERY FS (CUSTOM PROCEDURE TRAY) ×2 IMPLANT
PENCIL BUTTON HOLSTER BLD 10FT (ELECTRODE) ×2 IMPLANT
SLEEVE SCD COMPRESS KNEE MED (MISCELLANEOUS) ×2 IMPLANT
STAPLER VISISTAT 35W (STAPLE) ×2 IMPLANT
STRIP CLOSURE SKIN 1/2X4 (GAUZE/BANDAGES/DRESSINGS) ×2 IMPLANT
SUT MON AB 4-0 PC3 18 (SUTURE) ×2 IMPLANT
SUT PROLENE 2 0 SH DA (SUTURE) ×2 IMPLANT
SUT SILK 2 0 TIES 17X18 (SUTURE)
SUT SILK 2-0 18XBRD TIE BLK (SUTURE) IMPLANT
SUT VIC AB 3-0 SH 27 (SUTURE) ×2
SUT VIC AB 3-0 SH 27X BRD (SUTURE) ×1 IMPLANT
SYR 5ML LUER SLIP (SYRINGE) ×2 IMPLANT
SYR CONTROL 10ML LL (SYRINGE) ×2 IMPLANT
TOWEL OR 17X24 6PK STRL BLUE (TOWEL DISPOSABLE) ×2 IMPLANT
TOWEL OR NON WOVEN STRL DISP B (DISPOSABLE) ×2 IMPLANT
TUBE CONNECTING 20X1/4 (TUBING) IMPLANT
YANKAUER SUCT BULB TIP NO VENT (SUCTIONS) IMPLANT

## 2012-09-20 NOTE — Anesthesia Procedure Notes (Signed)
Procedure Name: LMA Insertion Date/Time: 09/20/2012 10:14 AM Performed by: Zenia Resides D Pre-anesthesia Checklist: Patient identified, Emergency Drugs available, Suction available and Patient being monitored Patient Re-evaluated:Patient Re-evaluated prior to inductionOxygen Delivery Method: Circle System Utilized Preoxygenation: Pre-oxygenation with 100% oxygen Intubation Type: IV induction Ventilation: Mask ventilation without difficulty LMA: LMA with gastric port inserted LMA Size: 4.0 Number of attempts: 1 Placement Confirmation: positive ETCO2 Tube secured with: Tape Dental Injury: Teeth and Oropharynx as per pre-operative assessment

## 2012-09-20 NOTE — Anesthesia Preprocedure Evaluation (Signed)
Anesthesia Evaluation  Patient identified by MRN, date of birth, ID band Patient awake    Reviewed: Allergy & Precautions, H&P , NPO status , Patient's Chart, lab work & pertinent test results  Airway Mallampati: I      Dental   Pulmonary neg pulmonary ROS,  breath sounds clear to auscultation        Cardiovascular negative cardio ROS  Rhythm:Regular Rate:Normal     Neuro/Psych    GI/Hepatic negative GI ROS, Neg liver ROS,   Endo/Other    Renal/GU negative Renal ROS     Musculoskeletal   Abdominal   Peds  Hematology negative hematology ROS (+)   Anesthesia Other Findings   Reproductive/Obstetrics                           Anesthesia Physical Anesthesia Plan  ASA: II  Anesthesia Plan: General   Post-op Pain Management:    Induction: Intravenous  Airway Management Planned: LMA  Additional Equipment:   Intra-op Plan:   Post-operative Plan: Extubation in OR  Informed Consent: I have reviewed the patients History and Physical, chart, labs and discussed the procedure including the risks, benefits and alternatives for the proposed anesthesia with the patient or authorized representative who has indicated his/her understanding and acceptance.   Dental advisory given  Plan Discussed with: CRNA, Anesthesiologist and Surgeon  Anesthesia Plan Comments:         Anesthesia Quick Evaluation

## 2012-09-20 NOTE — Transfer of Care (Signed)
Immediate Anesthesia Transfer of Care Note  Patient: Mary Dougherty  Procedure(s) Performed: Procedure(s): INSERTION PORT-A-CATH (Left)  Patient Location: PACU  Anesthesia Type:General  Level of Consciousness: awake, alert  and oriented  Airway & Oxygen Therapy: Patient Spontanous Breathing and Patient connected to face mask oxygen  Post-op Assessment: Report given to PACU RN and Post -op Vital signs reviewed and stable  Post vital signs: Reviewed and stable  Complications: No apparent anesthesia complications

## 2012-09-20 NOTE — H&P (View-Only) (Signed)
Patient ID: Mary Dougherty, female   DOB: 1970/07/25, 42 y.o.   MRN: [id number]  Chief Complaint  Patient presents with  . Other    HPI Mary Dougherty is a 42 y.o. female.  Referred by Dr Rogelia Mire HPI  74 yof who lives in [place] who palpated a right breast mass on her self exam.  She underwent mm that showed a right breast abnormality that was followed by u/s showing a 1.4 cm right breast mass. MRI then showed 2.7x2.3.1.6 cm mass with no other abnormalities.  Biopsy showed a grade I IDC that is er/pr positive, her2 negative and Ki is 70%.  She comes in today to discuss her options.  She has no real complaints.  Past Medical History  Diagnosis Date  . Anemia   . Thyroid disease     Past Surgical History  Procedure Laterality Date  . Wisdom tooth extraction      History reviewed. No pertinent family history.  Social History History  Substance Use Topics  . Smoking status: Never Smoker   . Smokeless tobacco: Not on file  . Alcohol Use: No    No Known Allergies  Current Outpatient Prescriptions  Medication Sig Dispense Refill  . dexamethasone (DECADRON) 4 MG tablet Take 2 tablets by mouth once a day on the day after chemotherapy and then take 2 tablets two times a day for 2 days. Take with food.  30 tablet  1  . ibuprofen (ADVIL,MOTRIN) 200 MG tablet Take 200 mg by mouth as needed for pain.      Marland Kitchen lidocaine-prilocaine (EMLA) cream Apply topically as needed.  30 g  6  . LORazepam (ATIVAN) 0.5 MG tablet Take 1 tablet (0.5 mg total) by mouth every 6 (six) hours as needed (Nausea or vomiting).  30 tablet  0  . ondansetron (ZOFRAN) 8 MG tablet Take 1 tablet (8 mg total) by mouth 2 (two) times daily as needed. Take two times a day as needed for nausea or vomiting starting on the third day after chemotherapy.  30 tablet  1  . Prenatal Vit-Fe Fumarate-FA (PRENATAL MULTIVITAMIN) TABS tablet Take 1 tablet by mouth daily at 12 noon.      . prochlorperazine  (COMPAZINE) 10 MG tablet Take 1 tablet (10 mg total) by mouth every 6 (six) hours as needed (Nausea or vomiting).  30 tablet  1   No current facility-administered medications for this visit.    Review of Systems Review of Systems  Constitutional: Negative for fever, chills and unexpected weight change.  HENT: Negative for hearing loss, congestion, sore throat, trouble swallowing and voice change.   Eyes: Negative for visual disturbance.  Respiratory: Negative for cough and wheezing.   Cardiovascular: Negative for chest pain, palpitations and leg swelling.  Gastrointestinal: Negative for nausea, vomiting, abdominal pain, diarrhea, constipation, blood in stool, abdominal distention and anal bleeding.  Genitourinary: Negative for hematuria, vaginal bleeding and difficulty urinating.  Musculoskeletal: Negative for arthralgias.  Skin: Negative for rash and wound.  Neurological: Negative for seizures, syncope and headaches.  Hematological: Negative for adenopathy. Does not bruise/bleed easily.  Psychiatric/Behavioral: Negative for confusion.    There were no vitals taken for this visit.  Physical Exam Physical Exam  Vitals reviewed. Constitutional: She appears well-developed and well-nourished.  Eyes: No scleral icterus.  Neck: Neck supple.  Cardiovascular: Normal rate, regular rhythm and normal heart sounds.   Pulmonary/Chest: Effort normal and breath sounds normal. She has no wheezes. She has no rales.  Right breast exhibits mass. Right breast exhibits no inverted nipple, no nipple discharge, no skin change and no tenderness. Left breast exhibits no inverted nipple, no mass, no nipple discharge, no skin change and no tenderness.    Abdominal: Soft. There is no tenderness.  Lymphadenopathy:    She has no cervical adenopathy.    Data Reviewed BILATERAL BREAST MRI WITH AND WITHOUT CONTRAST  Technique: Multiplanar, multisequence MR images of both breasts  were obtained prior to and  following the intravenous administration  of 20ml of Multihance.  THREE-DIMENSIONAL MR IMAGE RENDERING ON INDEPENDENT WORKSTATION:  Three-dimensional MR images were rendered by post-processing of  the original MR data on an independent DynaCad workstation. The  three-dimensional MR images were interpreted, and findings are  reported in the following complete MRI report for this study.  Comparison: [facility] exams  FINDINGS:  Breast composition: b: There are scattered areas of  fibroglandular density.  Background parenchymal enhancement: Mild to moderate  Right breast: In the right breast six o'clock location, there is  an irregular 2.7 x 2.3 x 1.6 cm mass with plateau type enhancement  kinetics demonstrating internal clip artifact, corresponding to the  biopsied invasive ductal carcinoma. Allowing for moderate  enhancement pattern, no other area of abnormal enhancement is  identified.  Left breast: No mass or abnormal enhancement.  Lymph nodes: No abnormal appearing lymph nodes.  Ancillary findings: None.  IMPRESSION:  Solitary irregular enhancing right breast mass 6 o'clock location  corresponding to biopsy-proven invasive ductal carcinoma. No MRI  evidence for multifocal/multicentric or contralateral malignancy.    Assessment    Right breast cancer     Plan    Primary chemotherapy, port placement    She is close to being lumpectomy candidate now but I think we could improve our ability to perform breast conservation therapy and give her better results and possibly less likelihood of positive margins by going with primary systemic therapy first.  She is agreeable to that.  We discussed port placment along with risks/benefits of that.  We will plan on mr at end of therapy and then surgery about 3 weeks after that. We discussed the staging and pathophysiology of breast cancer. We discussed all of the different options for treatment for breast cancer including surgery,  chemotherapy, radiation therapy, Herceptin, and antiestrogen therapy.   We discussed a sentinel lymph node biopsy at the time of surgery as she does not appear to having lymph node involvement right now. We discussed the performance of that with injection of radioactive tracer and blue dye. We discussed that she would have an incision underneath her axillary hairline. We discussed that there is a chance of having a positive node with a sentinel lymph node biopsy and we will await the permanent pathology to make any other first further decisions in terms of her treatment. One of these options might be to return to the operating room to perform an axillary lymph node dissection. We discussed about a 1-2% risk lifetime of chronic shoulder pain as well as lymphedema associated with a sentinel lymph node biopsy.  We discussed the options for treatment of the breast cancer which included lumpectomy versus a mastectomy. We discussed the performance of the lumpectomy with a wire placement. We discussed a 10-20% chance of a positive margin requiring reexcision in the operating room. We also discussed that she may need radiation therapy or antiestrogen therapy or both if she undergoes lumpectomy. We discussed the mastectomy and the postoperative care for that  as well. We discussed that there is no difference in her survival whether she undergoes lumpectomy with radiation therapy or antiestrogen therapy versus a mastectomy. There is a slight difference in the local recurrence rate being 3-5% with lumpectomy and about 1% with a mastectomy. I do think we can do breast conservation therapy better with primary systemic therapy with no downside or detrimental effect. We discussed the risks of operation including bleeding, infection, possible reoperation. She understands her further therapy will be based on what her stages at the time of her operation.     Mary Dougherty 09/14/2012, 4:42 PM

## 2012-09-20 NOTE — Anesthesia Postprocedure Evaluation (Signed)
  Anesthesia Post-op Note  Patient: Mary Dougherty  Procedure(s) Performed: Procedure(s): INSERTION PORT-A-CATH (Left)  Patient Location: PACU  Anesthesia Type:General  Level of Consciousness: awake  Airway and Oxygen Therapy: Patient Spontanous Breathing  Post-op Pain: mild  Post-op Assessment: Post-op Vital signs reviewed  Post-op Vital Signs: Reviewed  Complications: No apparent anesthesia complications

## 2012-09-20 NOTE — Interval H&P Note (Signed)
History and Physical Interval Note:  09/20/2012 9:47 AM  Mary Dougherty  has presented today for surgery, with the diagnosis of breast cancer  The various methods of treatment have been discussed with the patient and family. After consideration of risks, benefits and other options for treatment, the patient has consented to  Procedure(s): INSERTION PORT-A-CATH (N/A) as a surgical intervention .  The patient's history has been reviewed, patient examined, no change in status, stable for surgery.  I have reviewed the patient's chart and labs.  Questions were answered to the patient's satisfaction.     Kura Bethards

## 2012-09-20 NOTE — Op Note (Signed)
Preoperative diagnosis: right breast cancer Postoperative diagnosis: same as above Procedure: Left subclavian power port insertion Surgeon: Dr. Harden Mo Anesthesia: Gen. With LMA Estimated blood loss: Minimal Specimens: None Drains: None Complications: None Sponge and needle count correct at completion Disposition to recovery stable  Indications: This is a 42 year old female who was recently diagnosed with a T2 right breast cancer. We discussed all of her different options and they decided to proceed with primary systemic chemotherapy in order to possibly do breast conservation therapy in the future. We discussed port placement with the risks and benefits associated with that.  Procedure: After informed consent was obtained the patient was taken to the operating room. She was given 2 g of intravenous cefazolin. Sequential compression devices were placed on her legs. She was in placed under general anesthesia with an LMA. Her chest was then prepped and draped in the standard sterile surgical fashion. A surgical timeout was performed.  I infiltrated Marcaine throughout her left chest as well as upper clavicle. I accessed her subclavian vein on the second pass. A wire was passed and this was confirmed to be in position by fluoroscopy. I then made a pocket below this. I then tunneled the line between the 2 sides. I then dilated up the tract. I then inserted the peel-away sheath and dilator assembly. I followed this with fluoroscopy. Once this was done I removed the wire. I then placed the line through this and removed the peel-away sheath. The line was then brought back to be in the distal cava. I then cut the line and attached it to the port. I sutured the port in 2 positions with 2-0 Prolene suture. The port flushed easily and aspirated blood. I then placed heparin in the port. Final fluoroscopy showed the line to be in good position with the line without kinks in the port to be fine as well. I  then closes with 3-0 Vicryl, 4-0 Monocryl, and Dermabond. She tolerated this well was transferred to recovery.

## 2012-09-21 ENCOUNTER — Encounter: Payer: Self-pay | Admitting: *Deleted

## 2012-09-21 ENCOUNTER — Other Ambulatory Visit: Payer: BC Managed Care – PPO

## 2012-09-21 ENCOUNTER — Encounter (HOSPITAL_BASED_OUTPATIENT_CLINIC_OR_DEPARTMENT_OTHER): Payer: Self-pay | Admitting: General Surgery

## 2012-09-22 ENCOUNTER — Encounter: Payer: Self-pay | Admitting: Specialist

## 2012-09-22 NOTE — Progress Notes (Signed)
I met Ms. Point at breast clinic on 09/14/2012.  She was accompanied by her mother, her sister, and her aunt.  She rated her distress as a "6" initially but said she felt less distress after seeing the doctors.  I gave her information about support services and my contact information.

## 2012-09-26 ENCOUNTER — Encounter (HOSPITAL_COMMUNITY): Payer: Self-pay

## 2012-09-26 ENCOUNTER — Encounter (HOSPITAL_COMMUNITY)
Admission: RE | Admit: 2012-09-26 | Discharge: 2012-09-26 | Disposition: A | Discharge status: Home / Self Care | Payer: BC Managed Care – PPO | Source: Ambulatory Visit | Attending: Oncology | Admitting: Oncology

## 2012-09-26 DIAGNOSIS — K7689 Other specified diseases of liver: Secondary | ICD-10-CM | POA: Insufficient documentation

## 2012-09-26 DIAGNOSIS — C50919 Malignant neoplasm of unspecified site of unspecified female breast: Secondary | ICD-10-CM | POA: Insufficient documentation

## 2012-09-26 DIAGNOSIS — C50511 Malignant neoplasm of lower-outer quadrant of right female breast: Secondary | ICD-10-CM

## 2012-09-26 MED ORDER — FLUDEOXYGLUCOSE F - 18 (FDG) INJECTION
19.3000 | Freq: Once | INTRAVENOUS | Status: AC | PRN
  Administered 2012-09-26: 19.3 via INTRAVENOUS

## 2012-10-05 ENCOUNTER — Encounter (INDEPENDENT_AMBULATORY_CARE_PROVIDER_SITE_OTHER): Payer: Self-pay | Admitting: General Surgery

## 2012-10-05 ENCOUNTER — Ambulatory Visit (INDEPENDENT_AMBULATORY_CARE_PROVIDER_SITE_OTHER): Payer: BC Managed Care – PPO | Admitting: General Surgery

## 2012-10-05 VITALS — BP 110/60 | HR 78 | Temp 98.6°F | Resp 18 | Ht 65.0 in | Wt 208.0 lb

## 2012-10-05 DIAGNOSIS — Z09 Encounter for follow-up examination after completed treatment for conditions other than malignant neoplasm: Secondary | ICD-10-CM

## 2012-10-05 NOTE — Progress Notes (Signed)
Subjective:     Patient ID: Mary Dougherty, female   DOB: 1970-12-12, 42 y.o.   MRN: [id number]  HPI 42 year old female who underwent left subclavian port placement prior to beginning chemotherapy. She returns today doing well without any significant complaints.  Review of Systems     Objective:   Physical Exam Left chest incision the port in place without infection     Assessment:     Status post port placement     Plan:     She will begin chemotherapy on Friday. I will plan on seeing her back in about 2-3 months during chemotherapy. We discussed again the plan of chemotherapy followed by surgery after a repeat MRI at the completion of chemotherapy. Her genetic results are not available yet.

## 2012-10-07 ENCOUNTER — Ambulatory Visit (HOSPITAL_BASED_OUTPATIENT_CLINIC_OR_DEPARTMENT_OTHER): Payer: BC Managed Care – PPO

## 2012-10-07 ENCOUNTER — Other Ambulatory Visit: Payer: BC Managed Care – PPO | Admitting: Lab

## 2012-10-07 ENCOUNTER — Other Ambulatory Visit (HOSPITAL_BASED_OUTPATIENT_CLINIC_OR_DEPARTMENT_OTHER): Payer: BC Managed Care – PPO | Admitting: Lab

## 2012-10-07 ENCOUNTER — Encounter: Payer: Self-pay | Admitting: Oncology

## 2012-10-07 ENCOUNTER — Ambulatory Visit (HOSPITAL_BASED_OUTPATIENT_CLINIC_OR_DEPARTMENT_OTHER): Payer: BC Managed Care – PPO | Admitting: Oncology

## 2012-10-07 VITALS — BP 133/73 | HR 75 | Temp 98.5°F | Resp 20 | Ht 65.0 in | Wt 208.4 lb

## 2012-10-07 DIAGNOSIS — C50511 Malignant neoplasm of lower-outer quadrant of right female breast: Secondary | ICD-10-CM

## 2012-10-07 DIAGNOSIS — C50519 Malignant neoplasm of lower-outer quadrant of unspecified female breast: Secondary | ICD-10-CM

## 2012-10-07 DIAGNOSIS — C50919 Malignant neoplasm of unspecified site of unspecified female breast: Secondary | ICD-10-CM

## 2012-10-07 DIAGNOSIS — Z5111 Encounter for antineoplastic chemotherapy: Secondary | ICD-10-CM

## 2012-10-07 LAB — CBC WITH DIFFERENTIAL/PLATELET
Basophils Absolute: 0 10*3/uL (ref 0.0–0.1)
Eosinophils Absolute: 0.1 10*3/uL (ref 0.0–0.5)
HGB: 9.8 g/dL — ABNORMAL LOW (ref 11.6–15.9)
MCV: 80.6 fL (ref 79.5–101.0)
MONO#: 0.6 10*3/uL (ref 0.1–0.9)
MONO%: 4.1 % (ref 0.0–14.0)
NEUT#: 10.8 10*3/uL — ABNORMAL HIGH (ref 1.5–6.5)
RDW: 19.7 % — ABNORMAL HIGH (ref 11.2–14.5)

## 2012-10-07 LAB — COMPREHENSIVE METABOLIC PANEL (CC13)
Albumin: 3.6 g/dL (ref 3.5–5.0)
CO2: 23 mEq/L (ref 22–29)
Calcium: 9 mg/dL (ref 8.4–10.4)
Chloride: 109 mEq/L (ref 98–109)
Glucose: 95 mg/dl (ref 70–140)
Potassium: 3.9 mEq/L (ref 3.5–5.1)
Sodium: 139 mEq/L (ref 136–145)
Total Bilirubin: 0.92 mg/dL (ref 0.20–1.20)
Total Protein: 7.1 g/dL (ref 6.4–8.3)

## 2012-10-07 MED ORDER — HEPARIN SOD (PORK) LOCK FLUSH 100 UNIT/ML IV SOLN
500.0000 [IU] | Freq: Once | INTRAVENOUS | Status: AC | PRN
  Administered 2012-10-07: 500 [IU]
  Filled 2012-10-07: qty 5

## 2012-10-07 MED ORDER — FLUOROURACIL CHEMO INJECTION 2.5 GM/50ML
500.0000 mg/m2 | Freq: Once | INTRAVENOUS | Status: AC
  Administered 2012-10-07: 1050 mg via INTRAVENOUS
  Filled 2012-10-07: qty 21

## 2012-10-07 MED ORDER — DEXAMETHASONE SODIUM PHOSPHATE 20 MG/5ML IJ SOLN
12.0000 mg | Freq: Once | INTRAMUSCULAR | Status: AC
  Administered 2012-10-07: 12 mg via INTRAVENOUS

## 2012-10-07 MED ORDER — SODIUM CHLORIDE 0.9 % IV SOLN
500.0000 mg/m2 | Freq: Once | INTRAVENOUS | Status: AC
  Administered 2012-10-07: 1040 mg via INTRAVENOUS
  Filled 2012-10-07: qty 52

## 2012-10-07 MED ORDER — UNABLE TO FIND: 1.0000 "application " | Status: DC | PRN

## 2012-10-07 MED ORDER — DEXAMETHASONE SODIUM PHOSPHATE 20 MG/5ML IJ SOLN
INTRAMUSCULAR | Status: AC
  Filled 2012-10-07: qty 5

## 2012-10-07 MED ORDER — SODIUM CHLORIDE 0.9 % IV SOLN
150.0000 mg | Freq: Once | INTRAVENOUS | Status: AC
  Administered 2012-10-07: 150 mg via INTRAVENOUS
  Filled 2012-10-07: qty 5

## 2012-10-07 MED ORDER — EPIRUBICIN HCL CHEMO IV INJECTION 200 MG/100ML
100.0000 mg/m2 | Freq: Once | INTRAVENOUS | Status: AC
  Administered 2012-10-07: 200 mg via INTRAVENOUS
  Filled 2012-10-07: qty 100

## 2012-10-07 MED ORDER — PALONOSETRON HCL INJECTION 0.25 MG/5ML
INTRAVENOUS | Status: AC
  Filled 2012-10-07: qty 5

## 2012-10-07 MED ORDER — SODIUM CHLORIDE 0.9 % IJ SOLN
10.0000 mL | INTRAMUSCULAR | Status: DC | PRN
  Administered 2012-10-07: 10 mL
  Filled 2012-10-07: qty 10

## 2012-10-07 MED ORDER — SODIUM CHLORIDE 0.9 % IV SOLN
Freq: Once | INTRAVENOUS | Status: AC
  Administered 2012-10-07: 12:00:00 via INTRAVENOUS

## 2012-10-07 MED ORDER — PALONOSETRON HCL INJECTION 0.25 MG/5ML
0.2500 mg | Freq: Once | INTRAVENOUS | Status: AC
  Administered 2012-10-07: 0.25 mg via INTRAVENOUS

## 2012-10-07 NOTE — Progress Notes (Signed)
Enrolled pt in the [employer] First Step program.  I faxed signed form and activated card today.  °

## 2012-10-07 NOTE — Progress Notes (Signed)
Brisk blood return noted before, during and at end of Ellence administration.

## 2012-10-07 NOTE — Patient Instructions (Addendum)
[facility] Discharge Instructions for Patients Receiving Chemotherapy  Today you received the following chemotherapy agents Ellence/Adrucil/Cytoxan.  To help prevent nausea and vomiting after your treatment, we encourage you to take your nausea medication as prescribed.   If you develop nausea and vomiting that is not controlled by your nausea medication, call the clinic.   BELOW ARE SYMPTOMS THAT SHOULD BE REPORTED IMMEDIATELY:  *FEVER GREATER THAN 100.5 F  *CHILLS WITH OR WITHOUT FEVER  NAUSEA AND VOMITING THAT IS NOT CONTROLLED WITH YOUR NAUSEA MEDICATION  *UNUSUAL SHORTNESS OF BREATH  *UNUSUAL BRUISING OR BLEEDING  TENDERNESS IN MOUTH AND THROAT WITH OR WITHOUT PRESENCE OF ULCERS  *URINARY PROBLEMS  *BOWEL PROBLEMS  UNUSUAL RASH Items with * indicate a potential emergency and should be followed up as soon as possible.  Feel free to call the clinic you have any questions or concerns. The clinic phone number is [phone].

## 2012-10-08 ENCOUNTER — Ambulatory Visit (HOSPITAL_BASED_OUTPATIENT_CLINIC_OR_DEPARTMENT_OTHER): Payer: BC Managed Care – PPO

## 2012-10-08 VITALS — BP 130/56 | HR 86 | Temp 97.7°F | Resp 20

## 2012-10-08 DIAGNOSIS — Z5189 Encounter for other specified aftercare: Secondary | ICD-10-CM

## 2012-10-08 DIAGNOSIS — C50519 Malignant neoplasm of lower-outer quadrant of unspecified female breast: Secondary | ICD-10-CM

## 2012-10-08 MED ORDER — PEGFILGRASTIM INJECTION 6 MG/0.6ML
6.0000 mg | Freq: Once | SUBCUTANEOUS | Status: AC
  Administered 2012-10-08: 6 mg via SUBCUTANEOUS

## 2012-10-08 NOTE — Progress Notes (Signed)
Chemo follow up: Pt tolerated well, denies N/V or diarrhea.  States she has slight itching around port site, no redness. Suggested opsite for next treatment

## 2012-10-10 ENCOUNTER — Telehealth: Payer: Self-pay | Admitting: *Deleted

## 2012-10-10 NOTE — Telephone Encounter (Signed)
Per staff message and POF I have scheduled appts.  JMW  

## 2012-10-11 ENCOUNTER — Encounter: Payer: Self-pay | Admitting: Genetic Counselor

## 2012-10-11 ENCOUNTER — Telehealth: Payer: Self-pay | Admitting: Genetic Counselor

## 2012-10-11 NOTE — Telephone Encounter (Signed)
Revealed negative genetic testing but that she has a PTEN VUS.  Discussed PTEN study from [facility]. She is not interested in participating.  She needs additional paperwork sent to her from the lab to complete for financial assistance.  I will let the lab know this.

## 2012-10-12 ENCOUNTER — Encounter: Payer: Self-pay | Admitting: Emergency Medicine

## 2012-10-14 ENCOUNTER — Telehealth: Payer: Self-pay | Admitting: Oncology

## 2012-10-14 ENCOUNTER — Other Ambulatory Visit (HOSPITAL_BASED_OUTPATIENT_CLINIC_OR_DEPARTMENT_OTHER): Payer: BC Managed Care – PPO | Admitting: Lab

## 2012-10-14 ENCOUNTER — Encounter: Payer: Self-pay | Admitting: Adult Health

## 2012-10-14 ENCOUNTER — Telehealth: Payer: Self-pay | Admitting: *Deleted

## 2012-10-14 ENCOUNTER — Encounter: Payer: Self-pay | Admitting: Emergency Medicine

## 2012-10-14 ENCOUNTER — Ambulatory Visit (HOSPITAL_BASED_OUTPATIENT_CLINIC_OR_DEPARTMENT_OTHER): Payer: BC Managed Care – PPO | Admitting: Adult Health

## 2012-10-14 VITALS — BP 138/83 | HR 90 | Temp 98.2°F | Resp 20 | Ht 65.0 in | Wt 206.5 lb

## 2012-10-14 DIAGNOSIS — K602 Anal fissure, unspecified: Secondary | ICD-10-CM

## 2012-10-14 DIAGNOSIS — A6000 Herpesviral infection of urogenital system, unspecified: Secondary | ICD-10-CM | POA: Insufficient documentation

## 2012-10-14 DIAGNOSIS — D509 Iron deficiency anemia, unspecified: Secondary | ICD-10-CM

## 2012-10-14 DIAGNOSIS — C50511 Malignant neoplasm of lower-outer quadrant of right female breast: Secondary | ICD-10-CM

## 2012-10-14 DIAGNOSIS — C50519 Malignant neoplasm of lower-outer quadrant of unspecified female breast: Secondary | ICD-10-CM

## 2012-10-14 LAB — COMPREHENSIVE METABOLIC PANEL (CC13)
AST: 7 U/L (ref 5–34)
CO2: 27 mEq/L (ref 22–29)
Calcium: 8.4 mg/dL (ref 8.4–10.4)
Glucose: 134 mg/dl (ref 70–140)
Potassium: 4.1 mEq/L (ref 3.5–5.1)
Sodium: 139 mEq/L (ref 136–145)

## 2012-10-14 LAB — CBC WITH DIFFERENTIAL/PLATELET
BASO%: 0.6 % (ref 0.0–2.0)
Eosinophils Absolute: 0.1 10*3/uL (ref 0.0–0.5)
MCHC: 34.7 g/dL (ref 31.5–36.0)
MONO#: 0 10*3/uL — ABNORMAL LOW (ref 0.1–0.9)
NEUT#: 1.5 10*3/uL (ref 1.5–6.5)
RBC: 3.11 10*6/uL — ABNORMAL LOW (ref 3.70–5.45)
RDW: 20.3 % — ABNORMAL HIGH (ref 11.2–14.5)
WBC: 2.3 10*3/uL — ABNORMAL LOW (ref 3.9–10.3)
lymph#: 0.6 10*3/uL — ABNORMAL LOW (ref 0.9–3.3)

## 2012-10-14 MED ORDER — HYDROCORTISONE ACE-PRAMOXINE 1-1 % RE FOAM: 1.0000 | Freq: Two times a day (BID) | RECTAL | Status: DC

## 2012-10-14 NOTE — Patient Instructions (Addendum)
Doing well.  Labs are stable.  Monitor your vaginal bleeding and rectal bleeding and call if it increases in flow.  Take Colace 1 tablet twice per day.  Do sitz baths twice per day.  Also apply the the rectal foam once in the morning and once in the evening.  Please call us if you have any questions or concerns.    We will see you back Monday for labs.

## 2012-10-14 NOTE — Telephone Encounter (Signed)
Per staff message and POF I have scheduled appts.  JMW  

## 2012-10-14 NOTE — Telephone Encounter (Signed)
, °

## 2012-10-14 NOTE — Progress Notes (Signed)
OFFICE PROGRESS NOTE  CC**  DAUB, STEVE A, MD [address] [place] [address]  DIAGNOSIS: 42 year old female with ER positive, PR positive HER-2/neu neg invasive ductal carcinoma of the right breast.    PRIOR THERAPY: 1.  Patient went for her initial screening mammogram at the insistence of her sister. She was found to have a mass in the right breast. The ultrasound demonstrated a 1.4 cm area of concern. She had an MRI performed that revealed a 2.7 cm irregular area at the 6:00 location. There were no masses in the left breast no abnormal lymph nodes. Patient had a biopsy performed of the right breast that revealed an intermediate to high-grade invasive ductal carcinoma the tumor was ER +99% PR +100% HER-2/neu negative with a Ki-67 of 70%.   2. Patient was started on neoadjuvant chemotherapy.  She started dose dense FEC on 10/07/12  CURRENT THERAPY: FEC cycle 1 day 8  INTERVAL HISTORY: Mary Dougherty 42 y.o. female returns for follow up today after receiving her first cycle of FEC.  This morning she started having vaginal and rectal bleeding.  She has only required one pad thus far this morning.  She has a very slow constant bleed from her rectum.  She denies h/o hemorrhoids, pain with defecation.  She does have mild lower abdominal pain.  She was constipated on Monday, Tuesday, and Wednesday.  She increased her fiber intake and it improved.  She denies fevers, chills, nausea, vomiting, diarrhea, numbness.    MEDICAL HISTORY: Past Medical History  Diagnosis Date  . Anemia   . Thyroid disease   . Wears glasses   . Breast cancer 2014    ER+/PR+/Her2-    ALLERGIES:  has No Known Allergies.  MEDICATIONS:  Current Outpatient Prescriptions  Medication Sig Dispense Refill  . dexamethasone (DECADRON) 4 MG tablet Take 2 tablets by mouth once a day on the day after chemotherapy and then take 2 tablets two times a day for 2 days. Take with food.  30 tablet  1  .  lidocaine-prilocaine (EMLA) cream Apply topically as needed.  30 g  6  . Melatonin 3 MG TABS Take 3 mg by mouth daily.      . ondansetron (ZOFRAN) 8 MG tablet Take 1 tablet (8 mg total) by mouth 2 (two) times daily as needed. Take two times a day as needed for nausea or vomiting starting on the third day after chemotherapy.  30 tablet  1  . Prenatal Vit-Fe Fumarate-FA (PRENATAL MULTIVITAMIN) TABS tablet Take 1 tablet by mouth daily at 12 noon.      Marland Kitchen UNABLE TO FIND Apply 1 application topically as needed. Med Name: cranial prosthesis  1 application  0  . hydrocortisone-pramoxine (PROCTOFOAM-HC) rectal foam Place 1 applicator rectally 2 (two) times daily.  10 g  0  . LORazepam (ATIVAN) 0.5 MG tablet Take 1 tablet (0.5 mg total) by mouth every 6 (six) hours as needed (Nausea or vomiting).  30 tablet  0  . prochlorperazine (COMPAZINE) 10 MG tablet Take 1 tablet (10 mg total) by mouth every 6 (six) hours as needed (Nausea or vomiting).  30 tablet  1   No current facility-administered medications for this visit.    SURGICAL HISTORY:  Past Surgical History  Procedure Laterality Date  . Wisdom tooth extraction    . Portacath placement Left 09/20/2012    Procedure: INSERTION PORT-A-CATH;  Surgeon: Emelia Loron, MD;  Location: [facility];  Service: General;  Laterality: Left;    REVIEW OF SYSTEMS:  A 10 point review of systems was conducted and is otherwise negative except for what is noted above.    PHYSICAL EXAMINATION: Blood pressure 138/83, pulse 90, temperature 98.2 F (36.8 C), temperature source Oral, resp. rate 20, height 5\' 5"  (1.651 m), weight 206 lb 8 oz (93.668 kg), last menstrual period 09/19/2012. Body mass index is 34.36 kg/(m^2). General: Patient is a well appearing female in no acute distress HEENT: PERRLA, sclerae anicteric no conjunctival pallor, MMM Neck: supple, no palpable adenopathy Lungs: clear to auscultation bilaterally, no wheezes, rhonchi, or  rales Cardiovascular: regular rate rhythm, S1, S2, no murmurs, rubs or gallops Abdomen: Soft, non-tender, non-distended, normoactive bowel sounds, no HSM Extremities: warm and well perfused, no clubbing, cyanosis, or edema Skin: No rashes or lesions Rectum: no external hemorrhoids or blood visualized in rectum.   Breasts: right breast, soft and palpable mass, left breast no masses, nipple discharge, or nodularity.  ECOG PERFORMANCE STATUS: 1 - Symptomatic but completely ambulatory      LABORATORY DATA: Lab Results  Component Value Date   WBC 2.3* 10/14/2012   HGB 8.7* 10/14/2012   HCT 25.1* 10/14/2012   MCV 80.6 10/14/2012   PLT 170 10/14/2012      Chemistry      Component Value Date/Time   NA 139 10/14/2012 0808   K 4.1 10/14/2012 0808   CO2 27 10/14/2012 0808   BUN 8.1 10/14/2012 0808   CREATININE 0.7 10/14/2012 0808      Component Value Date/Time   CALCIUM 8.4 10/14/2012 0808   ALKPHOS 87 10/14/2012 0808   AST 7 10/14/2012 0808   ALT 12 10/14/2012 0808   BILITOT 1.08 10/14/2012 0808       RADIOGRAPHIC STUDIES:  Nm Pet Image Initial (pi) Skull Base To Thigh  09/26/2012   *RADIOLOGY REPORT*  Clinical Data: Initial treatment strategy for staging of right- sided breast cancer.  NUCLEAR MEDICINE PET SKULL BASE TO THIGH  Fasting Blood Glucose:  95  Technique:  19.3 mCi F-18 FDG was injected intravenously. CT data was obtained and used for attenuation correction and anatomic localization only.  (This was not acquired as a diagnostic CT examination.) Additional exam technical data entered on technologist worksheet.  Comparison:  Breast MR 09/09/2012  Findings:  Neck: No abnormal hypermetabolism.  Chest:  Medial right breast primary.  This measures 1.5 cm and a S.U.V. max of 10.2 on image 103/series 2.  No axillary hypermetabolic adenopathy.  No hypermetabolic thoracic nodes.  Abdomen/Pelvis:  No abnormal hypermetabolism.  Skeleton:  There is diffuse mildly increased marrow activity.  No  correlate on the CT images.  CT  images performed for attenuation correction demonstrate no significant findings within the neck.  A left-sided Port-A-Cath which terminates at the mid SVC.  No axillary or subpectoral adenopathy. Normal adrenal glands.  Mild hepatic steatosis.  IMPRESSION:  1.  Hypermetabolic medial right breast primary, without evidence of thoracic nodal or extrathoracic disease. 2.  Mildly increased marrow activity diffusely.  If the patient has already received chemotherapy, this is likely secondary to marrow stimulation.  If not, it is favored to be within normal variation but warrants followup attention. 3.  Hepatic steatosis.   Original Report Authenticated By: Jeronimo Greaves, M.D.   Dg Chest Port 1 View  09/20/2012   *RADIOLOGY REPORT*  Clinical Data: Port-A-Cath placement.  PORTABLE CHEST - 1 VIEW  Comparison: None.  Findings: Left subclavian Port-A-Cath tip projects over the SVC.  No definite pneumothorax.  Lungs are low in volume with linear subsegmental atelectasis in the medial left lower lobe.  No definite pleural fluid.  Heart size is accentuated by AP semi upright technique.  IMPRESSION:  1.  Left subclavian Port-A-Cath placement without definite complicating feature given somewhat apical lordotic view, low lung volumes and body habitus. 2.  Linear subsegmental atelectasis in the medial left lower lobe.   Original Report Authenticated By: Leanna Battles, M.D.   Dg Fluoro Guide Cv Line-no Report  09/20/2012   CLINICAL DATA: Port A Cath Insertion - [facility] - OR 5   FLOURO GUIDE CV LINE  Fluoroscopy was utilized by the requesting physician.  No radiographic  interpretation.     ASSESSMENT:  42 year old female with   #1 initial yearly mammogram found to have an abnormal mass in the right breast ultrasound confirmed the mass to be 1.4 cm at the 6:00 position MRI revealed the mass to be a little bit bigger at 2.7 cm. Biopsy revealed an invasive ductal carcinoma grade 2 ER positive PR  positive HER-2/neu negative with a Ki-67 of 70%.   #2 patient desires breast conservation and she will receive neoadjuvant chemotherapy initially consisting of FEC q. 2 weeks for a total of 6 cycles followed by Taxol weekly for a total of 12 weeks. Risks benefits and side effects of this treatment were discussed with the patient.   #3 patient will need antiestrogen therapy since her tumor is estrogen receptor positive.The working plan is for her to receive Tamoxifen for 10 years.    #4 patient will also need radiation therapy adjuvantly and has seen Dr. Basilio Cairo.   PLAN:  1. Patient is doing well.  Labs are stable following chemotherapy.  She is not neutropenic.  Her hemoglobin is stable.  She tolerated chemotherapy moderately well.    2.  In regards to the rectal and vaginal bleeding, this just started this morning.  She will continue to monitor the flow.  She likely has either internal hemorrhoids or an anal fissure, she is currently bleeding.  She will monitor the flow and call us if it increases.  I recommended she take Colace, one tablet BID and do sitz baths twice per day.  I prescribed Proctofoam-HC to apply BID.    3.  She will return on Monday for a lab appointment only.  I will repeat a CBC and also get some iron studies as she has h/o iron deficiency.    4. She will return next week for labs, an appointment with Dr. Welton Flakes, and cycle 2 of FEC.    All questions were answered. The patient knows to call the clinic with any problems, questions or concerns. We can certainly see the patient much sooner if necessary.  I spent 25 minutes counseling the patient face to face. The total time spent in the appointment was 30 minutes.  Cherie Ouch Lyn Hollingshead, NP Medical Oncology [facility] Phone: [phone]  10/15/2012, 9:43 AM

## 2012-10-17 ENCOUNTER — Other Ambulatory Visit (HOSPITAL_BASED_OUTPATIENT_CLINIC_OR_DEPARTMENT_OTHER): Payer: BC Managed Care – PPO

## 2012-10-17 DIAGNOSIS — K602 Anal fissure, unspecified: Secondary | ICD-10-CM

## 2012-10-17 DIAGNOSIS — C50511 Malignant neoplasm of lower-outer quadrant of right female breast: Secondary | ICD-10-CM

## 2012-10-17 DIAGNOSIS — C50519 Malignant neoplasm of lower-outer quadrant of unspecified female breast: Secondary | ICD-10-CM

## 2012-10-17 DIAGNOSIS — D509 Iron deficiency anemia, unspecified: Secondary | ICD-10-CM

## 2012-10-17 LAB — CBC WITH DIFFERENTIAL/PLATELET
Basophils Absolute: 0 10*3/uL (ref 0.0–0.1)
EOS%: 1.4 % (ref 0.0–7.0)
HCT: 27 % — ABNORMAL LOW (ref 34.8–46.6)
HGB: 8.9 g/dL — ABNORMAL LOW (ref 11.6–15.9)
MCH: 26.9 pg (ref 25.1–34.0)
MONO#: 0.4 10*3/uL (ref 0.1–0.9)
NEUT%: 49.7 % (ref 38.4–76.8)
Platelets: 217 10*3/uL (ref 145–400)
lymph#: 1.3 10*3/uL (ref 0.9–3.3)

## 2012-10-17 LAB — IRON AND TIBC CHCC
%SAT: 21 % (ref 21–57)
Iron: 48 ug/dL (ref 41–142)
TIBC: 235 ug/dL — ABNORMAL LOW (ref 236–444)

## 2012-10-21 ENCOUNTER — Other Ambulatory Visit (HOSPITAL_BASED_OUTPATIENT_CLINIC_OR_DEPARTMENT_OTHER): Payer: BC Managed Care – PPO | Admitting: Lab

## 2012-10-21 ENCOUNTER — Other Ambulatory Visit: Payer: BC Managed Care – PPO | Admitting: Lab

## 2012-10-21 ENCOUNTER — Ambulatory Visit (HOSPITAL_BASED_OUTPATIENT_CLINIC_OR_DEPARTMENT_OTHER): Payer: BC Managed Care – PPO

## 2012-10-21 ENCOUNTER — Ambulatory Visit (HOSPITAL_BASED_OUTPATIENT_CLINIC_OR_DEPARTMENT_OTHER): Payer: BC Managed Care – PPO | Admitting: Adult Health

## 2012-10-21 ENCOUNTER — Encounter: Payer: Self-pay | Admitting: Adult Health

## 2012-10-21 VITALS — BP 123/72 | HR 71 | Temp 97.9°F | Resp 18 | Ht 65.0 in | Wt 204.7 lb

## 2012-10-21 DIAGNOSIS — D649 Anemia, unspecified: Secondary | ICD-10-CM

## 2012-10-21 DIAGNOSIS — C50511 Malignant neoplasm of lower-outer quadrant of right female breast: Secondary | ICD-10-CM

## 2012-10-21 DIAGNOSIS — Z5111 Encounter for antineoplastic chemotherapy: Secondary | ICD-10-CM

## 2012-10-21 DIAGNOSIS — C50519 Malignant neoplasm of lower-outer quadrant of unspecified female breast: Secondary | ICD-10-CM

## 2012-10-21 DIAGNOSIS — K625 Hemorrhage of anus and rectum: Secondary | ICD-10-CM

## 2012-10-21 DIAGNOSIS — Z170 Estrogen receptor positive status [ER+]: Secondary | ICD-10-CM

## 2012-10-21 DIAGNOSIS — C50919 Malignant neoplasm of unspecified site of unspecified female breast: Secondary | ICD-10-CM

## 2012-10-21 LAB — COMPREHENSIVE METABOLIC PANEL (CC13)
AST: 14 U/L (ref 5–34)
Alkaline Phosphatase: 90 U/L (ref 40–150)
BUN: 5 mg/dL — ABNORMAL LOW (ref 7.0–26.0)
Creatinine: 0.6 mg/dL (ref 0.6–1.1)
Potassium: 4.2 mEq/L (ref 3.5–5.1)
Total Bilirubin: 0.87 mg/dL (ref 0.20–1.20)

## 2012-10-21 LAB — CBC WITH DIFFERENTIAL/PLATELET
Basophils Absolute: 0.1 10*3/uL (ref 0.0–0.1)
EOS%: 0.2 % (ref 0.0–7.0)
HGB: 9.3 g/dL — ABNORMAL LOW (ref 11.6–15.9)
LYMPH%: 16.9 % (ref 14.0–49.7)
MCH: 27.2 pg (ref 25.1–34.0)
MCV: 82.5 fL (ref 79.5–101.0)
MONO%: 4 % (ref 0.0–14.0)
NEUT%: 77.8 % — ABNORMAL HIGH (ref 38.4–76.8)
Platelets: 169 10*3/uL (ref 145–400)
RDW: 21.8 % — ABNORMAL HIGH (ref 11.2–14.5)

## 2012-10-21 MED ORDER — EPIRUBICIN HCL CHEMO IV INJECTION 200 MG/100ML
100.0000 mg/m2 | Freq: Once | INTRAVENOUS | Status: AC
  Administered 2012-10-21: 200 mg via INTRAVENOUS
  Filled 2012-10-21: qty 100

## 2012-10-21 MED ORDER — DEXAMETHASONE SODIUM PHOSPHATE 20 MG/5ML IJ SOLN
INTRAMUSCULAR | Status: AC
  Filled 2012-10-21: qty 5

## 2012-10-21 MED ORDER — PALONOSETRON HCL INJECTION 0.25 MG/5ML
INTRAVENOUS | Status: AC
  Filled 2012-10-21: qty 5

## 2012-10-21 MED ORDER — DEXAMETHASONE SODIUM PHOSPHATE 20 MG/5ML IJ SOLN
12.0000 mg | Freq: Once | INTRAMUSCULAR | Status: AC
  Administered 2012-10-21: 12 mg via INTRAVENOUS

## 2012-10-21 MED ORDER — SODIUM CHLORIDE 0.9 % IV SOLN
500.0000 mg/m2 | Freq: Once | INTRAVENOUS | Status: AC
  Administered 2012-10-21: 1040 mg via INTRAVENOUS
  Filled 2012-10-21: qty 52

## 2012-10-21 MED ORDER — SODIUM CHLORIDE 0.9 % IJ SOLN
10.0000 mL | INTRAMUSCULAR | Status: DC | PRN
  Administered 2012-10-21: 10 mL
  Filled 2012-10-21: qty 10

## 2012-10-21 MED ORDER — PALONOSETRON HCL INJECTION 0.25 MG/5ML
0.2500 mg | Freq: Once | INTRAVENOUS | Status: AC
  Administered 2012-10-21: 0.25 mg via INTRAVENOUS

## 2012-10-21 MED ORDER — SODIUM CHLORIDE 0.9 % IV SOLN
Freq: Once | INTRAVENOUS | Status: AC
  Administered 2012-10-21: 11:00:00 via INTRAVENOUS

## 2012-10-21 MED ORDER — FLUOROURACIL CHEMO INJECTION 2.5 GM/50ML
500.0000 mg/m2 | Freq: Once | INTRAVENOUS | Status: AC
  Administered 2012-10-21: 1050 mg via INTRAVENOUS
  Filled 2012-10-21: qty 21

## 2012-10-21 MED ORDER — HEPARIN SOD (PORK) LOCK FLUSH 100 UNIT/ML IV SOLN
500.0000 [IU] | Freq: Once | INTRAVENOUS | Status: AC | PRN
  Administered 2012-10-21: 500 [IU]
  Filled 2012-10-21: qty 5

## 2012-10-21 MED ORDER — SODIUM CHLORIDE 0.9 % IV SOLN
150.0000 mg | Freq: Once | INTRAVENOUS | Status: AC
  Administered 2012-10-21: 150 mg via INTRAVENOUS
  Filled 2012-10-21: qty 5

## 2012-10-21 NOTE — Progress Notes (Addendum)
OFFICE PROGRESS NOTE  CC**  DAUB, STEVE A, MD [address] [place] [address]  DIAGNOSIS: 42 year old female with ER positive, PR positive HER-2/neu neg invasive ductal carcinoma of the right breast.    PRIOR THERAPY: 1.  Patient went for her initial screening mammogram at the insistence of her sister. She was found to have a mass in the right breast. The ultrasound demonstrated a 1.4 cm area of concern. She had an MRI performed that revealed a 2.7 cm irregular area at the 6:00 location. There were no masses in the left breast no abnormal lymph nodes. Patient had a biopsy performed of the right breast that revealed an intermediate to high-grade invasive ductal carcinoma the tumor was ER +99% PR +100% HER-2/neu negative with a Ki-67 of 70%.   2. Patient was started on neoadjuvant chemotherapy.  She started dose dense FEC on 10/07/12  CURRENT THERAPY: FEC cycle 2 day 1  INTERVAL HISTORY: Mary Dougherty 42 y.o. female returns for follow up today prior to her second cycle of FEC.  She was having some mild rectal bleeding and vaginal bleeding last week.  It has almost completely resolved.  She is taking the stool softener recommended twice daily and it is helping.  She denies fevers, chills, nausea, vomiting, constipation, diarrhea, numbness, skin changes, pain, or further concerns.   MEDICAL HISTORY: Past Medical History  Diagnosis Date  . Anemia   . Thyroid disease   . Wears glasses   . Breast cancer 2014    ER+/PR+/Her2-    ALLERGIES:  has No Known Allergies.  MEDICATIONS:  Current Outpatient Prescriptions  Medication Sig Dispense Refill  . dexamethasone (DECADRON) 4 MG tablet Take 2 tablets by mouth once a day on the day after chemotherapy and then take 2 tablets two times a day for 2 days. Take with food.  30 tablet  1  . docusate sodium (COLACE) 100 MG capsule Take 100 mg by mouth 2 (two) times daily.      Marland Kitchen lidocaine-prilocaine (EMLA) cream Apply topically as needed.   30 g  6  . Melatonin 3 MG TABS Take 3 mg by mouth daily.      . ondansetron (ZOFRAN) 8 MG tablet Take 1 tablet (8 mg total) by mouth 2 (two) times daily as needed. Take two times a day as needed for nausea or vomiting starting on the third day after chemotherapy.  30 tablet  1  . Prenatal Vit-Fe Fumarate-FA (PRENATAL MULTIVITAMIN) TABS tablet Take 1 tablet by mouth daily at 12 noon.      Marland Kitchen UNABLE TO FIND Apply 1 application topically as needed. Med Name: cranial prosthesis  1 application  0  . LORazepam (ATIVAN) 0.5 MG tablet Take 1 tablet (0.5 mg total) by mouth every 6 (six) hours as needed (Nausea or vomiting).  30 tablet  0  . prochlorperazine (COMPAZINE) 10 MG tablet Take 1 tablet (10 mg total) by mouth every 6 (six) hours as needed (Nausea or vomiting).  30 tablet  1   No current facility-administered medications for this visit.    SURGICAL HISTORY:  Past Surgical History  Procedure Laterality Date  . Wisdom tooth extraction    . Portacath placement Left 09/20/2012    Procedure: INSERTION PORT-A-CATH;  Surgeon: Emelia Loron, MD;  Location: [facility];  Service: General;  Laterality: Left;    REVIEW OF SYSTEMS:  A 10 point review of systems was conducted and is otherwise negative except for what  is noted above.    PHYSICAL EXAMINATION: Blood pressure 123/72, pulse 71, temperature 97.9 F (36.6 C), temperature source Oral, resp. rate 18, height 5\' 5"  (1.651 m), weight 204 lb 11.2 oz (92.851 kg), last menstrual period 09/19/2012, SpO2 98.00%. Body mass index is 34.06 kg/(m^2). General: Patient is a well appearing female in no acute distress HEENT: PERRLA, sclerae anicteric no conjunctival pallor, MMM Neck: supple, no palpable adenopathy Lungs: clear to auscultation bilaterally, no wheezes, rhonchi, or rales Cardiovascular: regular rate rhythm, S1, S2, no murmurs, rubs or gallops Abdomen: Soft, non-tender, non-distended, normoactive bowel sounds, no HSM Extremities:  warm and well perfused, no clubbing, cyanosis, or edema Skin: No rashes or lesions Breasts: right breast, soft and palpable mass, left breast no masses, nipple discharge, or nodularity.  ECOG PERFORMANCE STATUS: 1 - Symptomatic but completely ambulatory      LABORATORY DATA: Lab Results  Component Value Date   WBC 10.7* 10/21/2012   HGB 9.3* 10/21/2012   HCT 28.2* 10/21/2012   MCV 82.5 10/21/2012   PLT 169 10/21/2012      Chemistry      Component Value Date/Time   NA 142 10/21/2012 0932   K 4.2 10/21/2012 0932   CO2 24 10/21/2012 0932   BUN 5.0* 10/21/2012 0932   CREATININE 0.6 10/21/2012 0932      Component Value Date/Time   CALCIUM 9.1 10/21/2012 0932   ALKPHOS 90 10/21/2012 0932   AST 14 10/21/2012 0932   ALT 15 10/21/2012 0932   BILITOT 0.87 10/21/2012 0932       RADIOGRAPHIC STUDIES:  Nm Pet Image Initial (pi) Skull Base To Thigh  09/26/2012   *RADIOLOGY REPORT*  Clinical Data: Initial treatment strategy for staging of right- sided breast cancer.  NUCLEAR MEDICINE PET SKULL BASE TO THIGH  Fasting Blood Glucose:  95  Technique:  19.3 mCi F-18 FDG was injected intravenously. CT data was obtained and used for attenuation correction and anatomic localization only.  (This was not acquired as a diagnostic CT examination.) Additional exam technical data entered on technologist worksheet.  Comparison:  Breast MR 09/09/2012  Findings:  Neck: No abnormal hypermetabolism.  Chest:  Medial right breast primary.  This measures 1.5 cm and a S.U.V. max of 10.2 on image 103/series 2.  No axillary hypermetabolic adenopathy.  No hypermetabolic thoracic nodes.  Abdomen/Pelvis:  No abnormal hypermetabolism.  Skeleton:  There is diffuse mildly increased marrow activity.  No correlate on the CT images.  CT  images performed for attenuation correction demonstrate no significant findings within the neck.  A left-sided Port-A-Cath which terminates at the mid SVC.  No axillary or subpectoral adenopathy. Normal  adrenal glands.  Mild hepatic steatosis.  IMPRESSION:  1.  Hypermetabolic medial right breast primary, without evidence of thoracic nodal or extrathoracic disease. 2.  Mildly increased marrow activity diffusely.  If the patient has already received chemotherapy, this is likely secondary to marrow stimulation.  If not, it is favored to be within normal variation but warrants followup attention. 3.  Hepatic steatosis.   Original Report Authenticated By: Jeronimo Greaves, M.D.   Dg Chest Port 1 View  09/20/2012   *RADIOLOGY REPORT*  Clinical Data: Port-A-Cath placement.  PORTABLE CHEST - 1 VIEW  Comparison: None.  Findings: Left subclavian Port-A-Cath tip projects over the SVC. No definite pneumothorax.  Lungs are low in volume with linear subsegmental atelectasis in the medial left lower lobe.  No definite pleural fluid.  Heart size is accentuated by AP semi upright  technique.  IMPRESSION:  1.  Left subclavian Port-A-Cath placement without definite complicating feature given somewhat apical lordotic view, low lung volumes and body habitus. 2.  Linear subsegmental atelectasis in the medial left lower lobe.   Original Report Authenticated By: Leanna Battles, M.D.   Dg Fluoro Guide Cv Line-no Report  09/20/2012   CLINICAL DATA: Port A Cath Insertion - [facility] - OR 5   FLOURO GUIDE CV LINE  Fluoroscopy was utilized by the requesting physician.  No radiographic  interpretation.     ASSESSMENT:  42 year old female with   #1 initial yearly mammogram found to have an abnormal mass in the right breast ultrasound confirmed the mass to be 1.4 cm at the 6:00 position MRI revealed the mass to be a little bit bigger at 2.7 cm. Biopsy revealed an invasive ductal carcinoma grade 2 ER positive PR positive HER-2/neu negative with a Ki-67 of 70%.   #2 patient desires breast conservation and she will receive neoadjuvant chemotherapy initially consisting of FEC q. 2 weeks for a total of 6 cycles followed by Taxol weekly for a total  of 12 weeks. Risks benefits and side effects of this treatment were discussed with the patient.   #3 patient will need antiestrogen therapy since her tumor is estrogen receptor positive.The working plan is for her to receive Tamoxifen for 10 years.    #4 patient will also need radiation therapy adjuvantly and has seen Dr. Basilio Cairo.   PLAN:  1. Patient is doing well.  Labs are stable today.  I discussed them with her in detail.  She will proceed with cycle 2 of FEC.    2. Her rectal bleeding is much improved with Colace BID, she will continue this.    3. Her iron studies are essentially normal.  She will continue an oral over the counter iron supplement daily.    4. She will return next week for labs, and an appointment for evaluation for chemo toxicities.   All questions were answered. The patient knows to call the clinic with any problems, questions or concerns. We can certainly see the patient much sooner if necessary.  I spent 25 minutes counseling the patient face to face. The total time spent in the appointment was 30 minutes.  Cherie Ouch Lyn Hollingshead, NP Medical Oncology [facility] Phone: [phone]  10/22/2012, 9:26 AM  ATTENDING'S ATTESTATION:  I personally reviewed patient's chart, examined patient myself, formulated the treatment plan as followed.    Patient is here for cycle #2 of her chemotherapy. So far she is tolerating it well without any significant problems. She is denying any nausea or vomiting. No peripheral paresthesias.  She will return in one week's time for interim lab.  Drue Second, MD Medical/Oncology [facility] [phone] (beeper) [phone] (Office)  10/23/2012, 11:38 PM

## 2012-10-21 NOTE — Patient Instructions (Signed)
Doing well.  Labs lok good.  Proceed with chemotherapy.

## 2012-10-21 NOTE — Patient Instructions (Addendum)
[facility] Discharge Instructions for Patients Receiving Chemotherapy  Today you received the following chemotherapy agents Ellence, Adrucil and Cytoxan.  To help prevent nausea and vomiting after your treatment, we encourage you to take your nausea medication.   If you develop nausea and vomiting that is not controlled by your nausea medication, call the clinic.   BELOW ARE SYMPTOMS THAT SHOULD BE REPORTED IMMEDIATELY:  *FEVER GREATER THAN 100.5 F  *CHILLS WITH OR WITHOUT FEVER  NAUSEA AND VOMITING THAT IS NOT CONTROLLED WITH YOUR NAUSEA MEDICATION  *UNUSUAL SHORTNESS OF BREATH  *UNUSUAL BRUISING OR BLEEDING  TENDERNESS IN MOUTH AND THROAT WITH OR WITHOUT PRESENCE OF ULCERS  *URINARY PROBLEMS  *BOWEL PROBLEMS  UNUSUAL RASH Items with * indicate a potential emergency and should be followed up as soon as possible.  Feel free to call the clinic you have any questions or concerns. The clinic phone number is [phone].    

## 2012-10-22 ENCOUNTER — Ambulatory Visit (HOSPITAL_BASED_OUTPATIENT_CLINIC_OR_DEPARTMENT_OTHER): Payer: BC Managed Care – PPO

## 2012-10-22 VITALS — BP 125/69 | HR 79 | Temp 97.5°F | Resp 18

## 2012-10-22 DIAGNOSIS — C50511 Malignant neoplasm of lower-outer quadrant of right female breast: Secondary | ICD-10-CM

## 2012-10-22 DIAGNOSIS — Z5189 Encounter for other specified aftercare: Secondary | ICD-10-CM

## 2012-10-22 DIAGNOSIS — C50519 Malignant neoplasm of lower-outer quadrant of unspecified female breast: Secondary | ICD-10-CM

## 2012-10-22 MED ORDER — PEGFILGRASTIM INJECTION 6 MG/0.6ML
6.0000 mg | Freq: Once | SUBCUTANEOUS | Status: AC
  Administered 2012-10-22: 6 mg via SUBCUTANEOUS

## 2012-10-22 NOTE — Patient Instructions (Signed)
[facility] Discharge Instructions for Patients Receiving Chemotherapy   Today you received Neulasta to support your immune system after your chemotherapy.  On 10/21/12  you received the following chemotherapy agents-Cytoxan, Ellence and 5 fU To help prevent nausea and vomiting after your treatment, we encourage you to take your nausea medication   If you develop nausea and vomiting that is not controlled by your nausea medication, call the clinic.   BELOW ARE SYMPTOMS THAT SHOULD BE REPORTED IMMEDIATELY:  *FEVER GREATER THAN 100.5 F  *CHILLS WITH OR WITHOUT FEVER  NAUSEA AND VOMITING THAT IS NOT CONTROLLED WITH YOUR NAUSEA MEDICATION  *UNUSUAL SHORTNESS OF BREATH  *UNUSUAL BRUISING OR BLEEDING  TENDERNESS IN MOUTH AND THROAT WITH OR WITHOUT PRESENCE OF ULCERS  *URINARY PROBLEMS  *BOWEL PROBLEMS  UNUSUAL RASH Items with * indicate a potential emergency and should be followed up as soon as possible.  Feel free to call the clinic you have any questions or concerns. The clinic phone number is [phone].

## 2012-10-23 NOTE — Progress Notes (Signed)
OFFICE PROGRESS NOTE  CC**  DAUB, STEVE A, MD [address] [place] [address]   DIAGNOSIS: 42 year old female with ER positive, PR positive HER-2/neu neg invasive ductal carcinoma of the right breast.  PRIOR THERAPY: 1. Patient went for her initial screening mammogram at the insistence of her sister. She was found to have a mass in the right breast. The ultrasound demonstrated a 1.4 cm area of concern. She had an MRI performed that revealed a 2.7 cm irregular area at the 6:00 location. There were no masses in the left breast no abnormal lymph nodes. Patient had a biopsy performed of the right breast that revealed an intermediate to high-grade invasive ductal carcinoma the tumor was ER +99% PR +100% HER-2/neu negative with a Ki-67 of 70%.  2.  start on neoadjuvant dose dense FEC starting 10/07/2012 for a total of 6 cycles. This will then be followed by Taxol weekly x12 cycles.  CURRENT THERAPY:FEC day 1 cycle 1  INTERVAL HISTORY: Mary Dougherty 42 y.o. female returns for followup visit. Overall she's doing well. She's had her chemotherapy teaching class echocardiogram performed. She is rated to get started on her treatments. She has no nausea vomiting no fevers chills or night sweats. Remainder of the template review of systems is negative.  MEDICAL HISTORY: Past Medical History  Diagnosis Date  . Anemia   . Thyroid disease   . Wears glasses   . Breast cancer 2014    ER+/PR+/Her2-    ALLERGIES:  has No Known Allergies.  MEDICATIONS:  Current Outpatient Prescriptions  Medication Sig Dispense Refill  . lidocaine-prilocaine (EMLA) cream Apply topically as needed.  30 g  6  . Prenatal Vit-Fe Fumarate-FA (PRENATAL MULTIVITAMIN) TABS tablet Take 1 tablet by mouth daily at 12 noon.      Marland Kitchen dexamethasone (DECADRON) 4 MG tablet Take 2 tablets by mouth once a day on the day after chemotherapy and then take 2 tablets two times a day for 2 days. Take with food.  30 tablet  1  .  docusate sodium (COLACE) 100 MG capsule Take 100 mg by mouth 2 (two) times daily.      Marland Kitchen LORazepam (ATIVAN) 0.5 MG tablet Take 1 tablet (0.5 mg total) by mouth every 6 (six) hours as needed (Nausea or vomiting).  30 tablet  0  . Melatonin 3 MG TABS Take 3 mg by mouth daily.      . ondansetron (ZOFRAN) 8 MG tablet Take 1 tablet (8 mg total) by mouth 2 (two) times daily as needed. Take two times a day as needed for nausea or vomiting starting on the third day after chemotherapy.  30 tablet  1  . prochlorperazine (COMPAZINE) 10 MG tablet Take 1 tablet (10 mg total) by mouth every 6 (six) hours as needed (Nausea or vomiting).  30 tablet  1  . UNABLE TO FIND Apply 1 application topically as needed. Med Name: cranial prosthesis  1 application  0   No current facility-administered medications for this visit.    SURGICAL HISTORY:  Past Surgical History  Procedure Laterality Date  . Wisdom tooth extraction    . Portacath placement Left 09/20/2012    Procedure: INSERTION PORT-A-CATH;  Surgeon: Emelia Loron, MD;  Location: [facility];  Service: General;  Laterality: Left;    REVIEW OF SYSTEMS:  Pertinent items are noted in HPI.   HEALTH MAINTENANCE:  PHYSICAL EXAMINATION: Blood pressure 133/73, pulse 75, temperature 98.5 F (36.9 C), temperature source  Oral, resp. rate 20, height 5\' 5"  (1.651 m), weight 208 lb 6.4 oz (94.53 kg), last menstrual period 09/19/2012. Body mass index is 34.68 kg/(m^2). ECOG PERFORMANCE STATUS: 0 - Asymptomatic   General appearance: alert, cooperative and appears stated age Lymph nodes: Cervical, supraclavicular, and axillary nodes normal. Resp: clear to auscultation bilaterally Back: symmetric, no curvature. ROM normal. No CVA tenderness. Cardio: regular rate and rhythm GI: soft, non-tender; bowel sounds normal; no masses,  no organomegaly Extremities: extremities normal, atraumatic, no cyanosis or edema Neurologic: Grossly normal   LABORATORY  DATA: Lab Results  Component Value Date   WBC 10.7* 10/21/2012   HGB 9.3* 10/21/2012   HCT 28.2* 10/21/2012   MCV 82.5 10/21/2012   PLT 169 10/21/2012      Chemistry      Component Value Date/Time   NA 142 10/21/2012 0932   K 4.2 10/21/2012 0932   CO2 24 10/21/2012 0932   BUN 5.0* 10/21/2012 0932   CREATININE 0.6 10/21/2012 0932      Component Value Date/Time   CALCIUM 9.1 10/21/2012 0932   ALKPHOS 90 10/21/2012 0932   AST 14 10/21/2012 0932   ALT 15 10/21/2012 0932   BILITOT 0.87 10/21/2012 0932       RADIOGRAPHIC STUDIES:  Nm Pet Image Initial (pi) Skull Base To Thigh  09/26/2012   *RADIOLOGY REPORT*  Clinical Data: Initial treatment strategy for staging of right- sided breast cancer.  NUCLEAR MEDICINE PET SKULL BASE TO THIGH  Fasting Blood Glucose:  95  Technique:  19.3 mCi F-18 FDG was injected intravenously. CT data was obtained and used for attenuation correction and anatomic localization only.  (This was not acquired as a diagnostic CT examination.) Additional exam technical data entered on technologist worksheet.  Comparison:  Breast MR 09/09/2012  Findings:  Neck: No abnormal hypermetabolism.  Chest:  Medial right breast primary.  This measures 1.5 cm and a S.U.V. max of 10.2 on image 103/series 2.  No axillary hypermetabolic adenopathy.  No hypermetabolic thoracic nodes.  Abdomen/Pelvis:  No abnormal hypermetabolism.  Skeleton:  There is diffuse mildly increased marrow activity.  No correlate on the CT images.  CT  images performed for attenuation correction demonstrate no significant findings within the neck.  A left-sided Port-A-Cath which terminates at the mid SVC.  No axillary or subpectoral adenopathy. Normal adrenal glands.  Mild hepatic steatosis.  IMPRESSION:  1.  Hypermetabolic medial right breast primary, without evidence of thoracic nodal or extrathoracic disease. 2.  Mildly increased marrow activity diffusely.  If the patient has already received chemotherapy, this is likely  secondary to marrow stimulation.  If not, it is favored to be within normal variation but warrants followup attention. 3.  Hepatic steatosis.   Original Report Authenticated By: Jeronimo Greaves, M.D.    ASSESSMENT: 42 year old female with   #1 initial yearly mammogram found to have an abnormal mass in the right breast ultrasound confirmed the mass to be 1.4 cm at the 6:00 position MRI revealed the mass to be a little bit bigger at 2.7 cm. Biopsy revealed an invasive ductal carcinoma grade 2 ER positive PR positive HER-2/neu negative with a Ki-67 of 70%.   #2 patient desires breast conservation and she will receive neoadjuvant chemotherapy initially consisting of FEC q. 2 weeks for a total of 6 cycles followed by Taxol weekly for a total of 12 weeks. Risks benefits and side effects of this treatment were discussed with the patient.   #3 patient will need antiestrogen therapy  since her tumor is estrogen receptor positive.The working plan is for her to receive Tamoxifen for 10 years.   #4 patient will also need radiation therapy adjuvantly and has seen Dr. Basilio Cairo.     PLAN:   #1 patient will proceed with cycle 1 day 1 of FEC.  #2 she will return tomorrow for Neulasta injection.  #3 she'll be seen back in one week's time for followup.   All questions were answered. The patient knows to call the clinic with any problems, questions or concerns. We can certainly see the patient much sooner if necessary.  I spent 25 minutes counseling the patient face to face. The total time spent in the appointment was 25 minutes.    Drue Second, MD Medical/Oncology [facility] [phone] (beeper) [phone] (Office)

## 2012-10-24 ENCOUNTER — Ambulatory Visit: Payer: BC Managed Care – PPO | Admitting: Oncology

## 2012-10-26 ENCOUNTER — Emergency Department (HOSPITAL_COMMUNITY)
Admission: EM | Admit: 2012-10-26 | Discharge: 2012-10-26 | Disposition: A | Discharge status: Home / Self Care | Payer: BC Managed Care – PPO | Attending: Emergency Medicine | Admitting: Emergency Medicine

## 2012-10-26 ENCOUNTER — Emergency Department (HOSPITAL_COMMUNITY): Payer: BC Managed Care – PPO

## 2012-10-26 ENCOUNTER — Encounter (HOSPITAL_COMMUNITY): Payer: Self-pay | Admitting: Emergency Medicine

## 2012-10-26 DIAGNOSIS — D72829 Elevated white blood cell count, unspecified: Secondary | ICD-10-CM | POA: Insufficient documentation

## 2012-10-26 DIAGNOSIS — R197 Diarrhea, unspecified: Secondary | ICD-10-CM | POA: Insufficient documentation

## 2012-10-26 DIAGNOSIS — K921 Melena: Secondary | ICD-10-CM | POA: Insufficient documentation

## 2012-10-26 DIAGNOSIS — Z862 Personal history of diseases of the blood and blood-forming organs and certain disorders involving the immune mechanism: Secondary | ICD-10-CM | POA: Insufficient documentation

## 2012-10-26 DIAGNOSIS — R404 Transient alteration of awareness: Secondary | ICD-10-CM | POA: Insufficient documentation

## 2012-10-26 DIAGNOSIS — R109 Unspecified abdominal pain: Secondary | ICD-10-CM | POA: Insufficient documentation

## 2012-10-26 DIAGNOSIS — C50919 Malignant neoplasm of unspecified site of unspecified female breast: Secondary | ICD-10-CM | POA: Insufficient documentation

## 2012-10-26 DIAGNOSIS — E860 Dehydration: Secondary | ICD-10-CM | POA: Insufficient documentation

## 2012-10-26 DIAGNOSIS — Z79899 Other long term (current) drug therapy: Secondary | ICD-10-CM | POA: Insufficient documentation

## 2012-10-26 DIAGNOSIS — R55 Syncope and collapse: Secondary | ICD-10-CM | POA: Insufficient documentation

## 2012-10-26 DIAGNOSIS — R42 Dizziness and giddiness: Secondary | ICD-10-CM | POA: Insufficient documentation

## 2012-10-26 DIAGNOSIS — Z8639 Personal history of other endocrine, nutritional and metabolic disease: Secondary | ICD-10-CM | POA: Insufficient documentation

## 2012-10-26 DIAGNOSIS — R110 Nausea: Secondary | ICD-10-CM | POA: Insufficient documentation

## 2012-10-26 DIAGNOSIS — D649 Anemia, unspecified: Secondary | ICD-10-CM | POA: Insufficient documentation

## 2012-10-26 LAB — CBC WITH DIFFERENTIAL/PLATELET
Basophils Relative: 0 % (ref 0–1)
Eosinophils Absolute: 0 10*3/uL (ref 0.0–0.7)
HCT: 28.6 % — ABNORMAL LOW (ref 36.0–46.0)
Hemoglobin: 9.6 g/dL — ABNORMAL LOW (ref 12.0–15.0)
Lymphocytes Relative: 7 % — ABNORMAL LOW (ref 12–46)
Lymphs Abs: 1.4 10*3/uL (ref 0.7–4.0)
MCHC: 33.6 g/dL (ref 30.0–36.0)
Neutro Abs: 18.1 10*3/uL — ABNORMAL HIGH (ref 1.7–7.7)
RBC: 3.44 MIL/uL — ABNORMAL LOW (ref 3.87–5.11)

## 2012-10-26 LAB — COMPREHENSIVE METABOLIC PANEL
Alkaline Phosphatase: 118 U/L — ABNORMAL HIGH (ref 39–117)
BUN: 15 mg/dL (ref 6–23)
Chloride: 101 mEq/L (ref 96–112)
GFR calc Af Amer: >90 mL/min (ref >90)
GFR calc non Af Amer: >90 mL/min (ref >90)
Glucose, Bld: 98 mg/dL (ref 70–99)
Potassium: 3.3 mEq/L — ABNORMAL LOW (ref 3.5–5.1)
Total Bilirubin: 0.6 mg/dL (ref 0.3–1.2)
Total Protein: 6.3 g/dL (ref 6.0–8.3)

## 2012-10-26 LAB — LIPASE, BLOOD: Lipase: 17 U/L (ref 11–59)

## 2012-10-26 LAB — OCCULT BLOOD, POC DEVICE: Fecal Occult Bld: POSITIVE — AB

## 2012-10-26 MED ORDER — POTASSIUM CHLORIDE CRYS ER 20 MEQ PO TBCR
40.0000 meq | EXTENDED_RELEASE_TABLET | Freq: Once | ORAL | Status: AC
  Administered 2012-10-26: 40 meq via ORAL
  Filled 2012-10-26: qty 2

## 2012-10-26 MED ORDER — ONDANSETRON 4 MG PO TBDP: ORAL_TABLET | ORAL | Status: DC

## 2012-10-26 MED ORDER — IOHEXOL 300 MG/ML  SOLN
100.0000 mL | Freq: Once | INTRAMUSCULAR | Status: AC | PRN
  Administered 2012-10-26: 100 mL via INTRAVENOUS

## 2012-10-26 MED ORDER — HYDROMORPHONE HCL PF 1 MG/ML IJ SOLN
0.5000 mg | Freq: Once | INTRAMUSCULAR | Status: AC
  Administered 2012-10-26: 0.5 mg via INTRAVENOUS
  Filled 2012-10-26: qty 1

## 2012-10-26 MED ORDER — SODIUM CHLORIDE 0.9 % IV BOLUS (SEPSIS)
1000.0000 mL | Freq: Once | INTRAVENOUS | Status: AC
  Administered 2012-10-26: 1000 mL via INTRAVENOUS

## 2012-10-26 MED ORDER — IOHEXOL 300 MG/ML  SOLN: 100.0000 mL | Freq: Once | INTRAMUSCULAR | Status: DC | PRN

## 2012-10-26 MED ORDER — IOHEXOL 300 MG/ML  SOLN
50.0000 mL | Freq: Once | INTRAMUSCULAR | Status: AC | PRN
  Administered 2012-10-26: 50 mL via ORAL

## 2012-10-26 NOTE — ED Notes (Signed)
Patient transported to CT 

## 2012-10-26 NOTE — ED Provider Notes (Signed)
CSN: [id number]     Arrival date & time 10/26/12  0846 History   First MD Initiated Contact with Patient 10/26/12 [phone]     Chief Complaint  Patient presents with  . Abdominal Cramping  . Loss of Consciousness   (Consider location/radiation/quality/duration/timing/severity/associated sxs/prior Treatment) HPI Comments: 42 yo female with breast CA, no known mets, pt on chemo (unsure drug) and had second round end of last week presents with abx cramping, small amount of blood in stool and brief loc this am after pain/ nausea/lightheaded, no significant head injury, no ha prior..  Pt has passed out with pain and nausea in the past. No blood clot hx or cardiac hx. No cp or sob.  Decreased po intake recently.  Anemia hx.  Abd cramping, lower, improved, worse on left lower, no vaginal sxs.    Patient is a 42 y.o. female presenting with cramps and syncope. The history is provided by the patient.  Abdominal Cramping This is a recurrent problem. Associated symptoms include abdominal pain. Pertinent negatives include no chest pain, no headaches and no shortness of breath.  Loss of Consciousness Associated symptoms: no chest pain, no fever, no headaches, no shortness of breath and no vomiting     Past Medical History  Diagnosis Date  . Anemia   . Thyroid disease   . Wears glasses   . Breast cancer 2014    ER+/PR+/Her2-   Past Surgical History  Procedure Laterality Date  . Wisdom tooth extraction    . Portacath placement Left 09/20/2012    Procedure: INSERTION PORT-A-CATH;  Surgeon: Emelia Loron, MD;  Location: [facility];  Service: General;  Laterality: Left;   Family History  Problem Relation Age of Onset  . Pulmonary embolism Maternal Uncle 45    died instantly  . Melanoma Cousin     paternal cousin dx in her 19s   History  Substance Use Topics  . Smoking status: Never Smoker   . Smokeless tobacco: Not on file  . Alcohol Use: No   OB History   Grav Para Term  Preterm Abortions TAB SAB Ect Mult Living                 Review of Systems  Constitutional: Positive for appetite change and fatigue. Negative for fever and chills.  HENT: Negative for neck pain and neck stiffness.   Eyes: Negative for visual disturbance.  Respiratory: Negative for shortness of breath.   Cardiovascular: Positive for syncope. Negative for chest pain.  Gastrointestinal: Positive for abdominal pain. Negative for vomiting.  Genitourinary: Negative for dysuria and flank pain.  Musculoskeletal: Negative for back pain.  Skin: Negative for rash.  Neurological: Positive for syncope and light-headedness. Negative for headaches.    Allergies  Review of patient's allergies indicates no known allergies.  Home Medications   Current Outpatient Rx  Name  Route  Sig  Dispense  Refill  . dexamethasone (DECADRON) 4 MG tablet      Take 2 tablets by mouth once a day on the day after chemotherapy and then take 2 tablets two times a day for 2 days. Take with food.   30 tablet   1   . docusate sodium (COLACE) 100 MG capsule   Oral   Take 100 mg by mouth 2 (two) times daily.         . folic acid (FOLVITE) 400 MCG tablet   Oral   Take 400 mcg by mouth daily.         Marland Kitchen  lidocaine-prilocaine (EMLA) cream   Topical   Apply topically as needed.   30 g   6   . LORazepam (ATIVAN) 0.5 MG tablet   Oral   Take 1 tablet (0.5 mg total) by mouth every 6 (six) hours as needed (Nausea or vomiting).   30 tablet   0   . Melatonin 3 MG TABS   Oral   Take 3 mg by mouth daily.         . Prenatal Vit-Fe Fumarate-FA (PRENATAL MULTIVITAMIN) TABS tablet   Oral   Take 1 tablet by mouth daily at 12 noon.         . prochlorperazine (COMPAZINE) 10 MG tablet   Oral   Take 1 tablet (10 mg total) by mouth every 6 (six) hours as needed (Nausea or vomiting).   30 tablet   1    BP 100/45  Pulse 71  Temp(Src) 97.5 F (36.4 C) (Oral)  Resp 16  SpO2 99%  LMP 09/19/2012 Physical  Exam  Nursing note and vitals reviewed. Constitutional: She is oriented to person, place, and time. She appears well-developed and well-nourished.  HENT:  Head: Normocephalic and atraumatic.  Dry mm  Eyes: Conjunctivae are normal. Right eye exhibits no discharge. Left eye exhibits no discharge.  Neck: Normal range of motion. Neck supple. No tracheal deviation present.  Cardiovascular: Normal rate and regular rhythm.   Pulmonary/Chest: Effort normal and breath sounds normal.  Abdominal: Soft. She exhibits no distension. There is tenderness (mild suprapubic). There is no guarding.  Musculoskeletal: She exhibits no edema and no tenderness.  Neurological: She is alert and oriented to person, place, and time.  Skin: Skin is warm. No rash noted.  Psychiatric: She has a normal mood and affect.    ED Course  Procedures (including critical care time) Labs Review Labs Reviewed  CBC WITH DIFFERENTIAL - Abnormal; Notable for the following:    WBC 19.5 (*)    RBC 3.44 (*)    Hemoglobin 9.6 (*)    HCT 28.6 (*)    RDW 20.4 (*)    Neutrophils Relative % 93 (*)    Lymphocytes Relative 7 (*)    Monocytes Relative 0 (*)    Neutro Abs 18.1 (*)    Monocytes Absolute 0.0 (*)    All other components within normal limits  COMPREHENSIVE METABOLIC PANEL - Abnormal; Notable for the following:    Potassium 3.3 (*)    Alkaline Phosphatase 118 (*)    All other components within normal limits  OCCULT BLOOD, POC DEVICE - Abnormal; Notable for the following:    Fecal Occult Bld POSITIVE (*)    All other components within normal limits  LIPASE, BLOOD   Imaging Review No results found.  MDM  No diagnosis found. Clinically dehydration and mild abd pain.  With wbc elevated (may be from recent steroids)/ pain and hemocult pos CT to look for further pathology/ colitis.  CT reviewed, no acute findings per radiology.  Pt improved significantly in ED, no bleeding episodes, brown stool. Close fup discussed.   Anemia hx.   PO K given.  DC  Ct Abdomen Pelvis W Contrast  10/26/2012   CLINICAL DATA:  Abdominal pain, anemia, elevated white blood cell count  EXAM: CT ABDOMEN AND PELVIS WITH CONTRAST  TECHNIQUE: Multidetector CT imaging of the abdomen and pelvis was performed using the standard protocol following bolus administration of intravenous contrast.  CONTRAST:  OMNIPAQUE IOHEXOL 300 MG/ML  SOLN  COMPARISON:  None.  FINDINGS: The lung bases clear. The heart is within upper limits of normal. The liver enhances with no focal abnormality and no ductal dilatation is seen. No calcified gallstones are noted. The pancreas is normal in size in the pancreatic duct is not dilated. The adrenal glands are unremarkable and the spleen is minimally prominent. The stomach is moderately fluid distended with no significant abnormality noted. The kidneys enhance with a probable small exophytic cyst emanating from the upper pole of the left kidney anteriorly. No renal calculi are seen in on delayed images the pelvocaliceal systems are unremarkable. The proximal ureters are normal in caliber. No adenopathy is seen. The abdominal aorta is unremarkable.  Uterus is normal in size. Small ovarian follicles are present and only a tiny amount of free fluid layers within the pelvis, most likely physiologic. Urinary bladder is not well distended. The appendix and the terminal ileum are unremarkable. There is minimal thickening of the mucosa of the rectosigmoid colon of questionable significance. No definite CT evidence of colitis is seen. No acute bony abnormality is seen and the lumbar vertebrae are in normal alignment.  IMPRESSION: 1. No acute abnormality on CT of the abdomen and pelvis. 2. The appendix and terminal ileum are unremarkable. . 3. Minimal thickening of the mucosa of the rectosigmoid colon of questionable significance. No definite CT evidence of colitis.   Electronically Signed   By: Dwyane Dee M.D.   On: 10/26/2012 12:28    Abdominal pain, Anemia, Leukocytosis, Breast CA, Dehydration  Enid Skeens, MD 10/27/12 [phone]

## 2012-10-26 NOTE — ED Notes (Signed)
Pt states that she started having low abd cramping this morning.  States that she tried to use the bathroom and passed out on the toilet from the pain.  States that she has had this happen before and she was anemic.  States that she had a small BM this morning.

## 2012-10-27 ENCOUNTER — Telehealth: Payer: Self-pay | Admitting: Emergency Medicine

## 2012-10-27 NOTE — Telephone Encounter (Signed)
Called her to advise, she is feeling much better today. She will see Dr Welton Flakes tomorrow. She will let us know if she needs anything.

## 2012-10-27 NOTE — Telephone Encounter (Signed)
Patient was seen in the emergency room yesterday with abdominal pain. Please call patient and be sure she follows up either here or at the emergency room if she continues to have symptoms

## 2012-10-28 ENCOUNTER — Ambulatory Visit (HOSPITAL_BASED_OUTPATIENT_CLINIC_OR_DEPARTMENT_OTHER): Payer: BC Managed Care – PPO | Admitting: Oncology

## 2012-10-28 ENCOUNTER — Encounter: Payer: Self-pay | Admitting: Oncology

## 2012-10-28 ENCOUNTER — Other Ambulatory Visit (HOSPITAL_BASED_OUTPATIENT_CLINIC_OR_DEPARTMENT_OTHER): Payer: BC Managed Care – PPO | Admitting: Lab

## 2012-10-28 VITALS — BP 145/64 | HR 89 | Temp 98.3°F | Resp 20 | Ht 65.0 in | Wt 199.3 lb

## 2012-10-28 DIAGNOSIS — C50511 Malignant neoplasm of lower-outer quadrant of right female breast: Secondary | ICD-10-CM

## 2012-10-28 DIAGNOSIS — Z170 Estrogen receptor positive status [ER+]: Secondary | ICD-10-CM

## 2012-10-28 DIAGNOSIS — C50519 Malignant neoplasm of lower-outer quadrant of unspecified female breast: Secondary | ICD-10-CM

## 2012-10-28 DIAGNOSIS — C50919 Malignant neoplasm of unspecified site of unspecified female breast: Secondary | ICD-10-CM

## 2012-10-28 LAB — COMPREHENSIVE METABOLIC PANEL (CC13)
AST: 9 U/L (ref 5–34)
Albumin: 3.6 g/dL (ref 3.5–5.0)
Alkaline Phosphatase: 108 U/L (ref 40–150)
CO2: 25 mEq/L (ref 22–29)
Creatinine: 0.7 mg/dL (ref 0.6–1.1)
Glucose: 151 mg/dl — ABNORMAL HIGH (ref 70–140)
Potassium: 4.1 mEq/L (ref 3.5–5.1)
Sodium: 139 mEq/L (ref 136–145)
Total Bilirubin: 0.93 mg/dL (ref 0.20–1.20)
Total Protein: 6.7 g/dL (ref 6.4–8.3)

## 2012-10-28 LAB — CBC WITH DIFFERENTIAL/PLATELET
Basophils Absolute: 0 10*3/uL (ref 0.0–0.1)
EOS%: 0.6 % (ref 0.0–7.0)
LYMPH%: 16 % (ref 14.0–49.7)
MCH: 27.7 pg (ref 25.1–34.0)
MCHC: 34.9 g/dL (ref 31.5–36.0)
MCV: 79.3 fL — ABNORMAL LOW (ref 79.5–101.0)
MONO#: 0.1 10*3/uL (ref 0.1–0.9)
MONO%: 3.2 % (ref 0.0–14.0)
NEUT#: 3 10*3/uL (ref 1.5–6.5)
Platelets: 214 10*3/uL (ref 145–400)
RBC: 3.16 10*6/uL — ABNORMAL LOW (ref 3.70–5.45)
RDW: 21.7 % — ABNORMAL HIGH (ref 11.2–14.5)

## 2012-10-28 MED ORDER — VALACYCLOVIR HCL 500 MG PO TABS: 500.0000 mg | ORAL_TABLET | Freq: Two times a day (BID) | ORAL | Status: DC

## 2012-10-28 MED ORDER — OXYCODONE HCL 5 MG PO TABS: 5.0000 mg | ORAL_TABLET | ORAL | Status: DC | PRN

## 2012-11-04 ENCOUNTER — Encounter: Payer: Self-pay | Admitting: Adult Health

## 2012-11-04 ENCOUNTER — Ambulatory Visit (HOSPITAL_BASED_OUTPATIENT_CLINIC_OR_DEPARTMENT_OTHER): Payer: BC Managed Care – PPO | Admitting: Adult Health

## 2012-11-04 ENCOUNTER — Telehealth: Payer: Self-pay | Admitting: *Deleted

## 2012-11-04 ENCOUNTER — Other Ambulatory Visit (HOSPITAL_BASED_OUTPATIENT_CLINIC_OR_DEPARTMENT_OTHER): Payer: BC Managed Care – PPO

## 2012-11-04 ENCOUNTER — Ambulatory Visit (HOSPITAL_BASED_OUTPATIENT_CLINIC_OR_DEPARTMENT_OTHER): Payer: BC Managed Care – PPO

## 2012-11-04 ENCOUNTER — Other Ambulatory Visit: Payer: BC Managed Care – PPO | Admitting: Lab

## 2012-11-04 VITALS — BP 135/64 | HR 80 | Temp 98.2°F | Resp 20 | Ht 65.0 in | Wt 202.1 lb

## 2012-11-04 DIAGNOSIS — M542 Cervicalgia: Secondary | ICD-10-CM

## 2012-11-04 DIAGNOSIS — K625 Hemorrhage of anus and rectum: Secondary | ICD-10-CM

## 2012-11-04 DIAGNOSIS — C50511 Malignant neoplasm of lower-outer quadrant of right female breast: Secondary | ICD-10-CM

## 2012-11-04 DIAGNOSIS — C50919 Malignant neoplasm of unspecified site of unspecified female breast: Secondary | ICD-10-CM

## 2012-11-04 DIAGNOSIS — Z5111 Encounter for antineoplastic chemotherapy: Secondary | ICD-10-CM

## 2012-11-04 DIAGNOSIS — Z170 Estrogen receptor positive status [ER+]: Secondary | ICD-10-CM

## 2012-11-04 LAB — CBC WITH DIFFERENTIAL/PLATELET
BASO%: 1.9 % (ref 0.0–2.0)
Basophils Absolute: 0.3 10*3/uL — ABNORMAL HIGH (ref 0.0–0.1)
EOS%: 0.1 % (ref 0.0–7.0)
HGB: 8.8 g/dL — ABNORMAL LOW (ref 11.6–15.9)
LYMPH%: 12 % — ABNORMAL LOW (ref 14.0–49.7)
MCH: 27.2 pg (ref 25.1–34.0)
MCV: 84.3 fL (ref 79.5–101.0)
MONO%: 8.6 % (ref 0.0–14.0)
NEUT#: 11.1 10*3/uL — ABNORMAL HIGH (ref 1.5–6.5)
Platelets: 186 10*3/uL (ref 145–400)
RDW: 24.4 % — ABNORMAL HIGH (ref 11.2–14.5)

## 2012-11-04 LAB — COMPREHENSIVE METABOLIC PANEL (CC13)
ALT: 15 U/L (ref 0–55)
AST: 15 U/L (ref 5–34)
Albumin: 3.7 g/dL (ref 3.5–5.0)
Alkaline Phosphatase: 96 U/L (ref 40–150)
BUN: 5.8 mg/dL — ABNORMAL LOW (ref 7.0–26.0)
Creatinine: 0.6 mg/dL (ref 0.6–1.1)
Glucose: 101 mg/dl (ref 70–140)
Potassium: 4 mEq/L (ref 3.5–5.1)
Total Bilirubin: 0.86 mg/dL (ref 0.20–1.20)

## 2012-11-04 MED ORDER — SODIUM CHLORIDE 0.9 % IV SOLN
Freq: Once | INTRAVENOUS | Status: AC
  Administered 2012-11-04: 15:00:00 via INTRAVENOUS

## 2012-11-04 MED ORDER — HEPARIN SOD (PORK) LOCK FLUSH 100 UNIT/ML IV SOLN
500.0000 [IU] | Freq: Once | INTRAVENOUS | Status: AC | PRN
  Administered 2012-11-04: 500 [IU]
  Filled 2012-11-04: qty 5

## 2012-11-04 MED ORDER — PALONOSETRON HCL INJECTION 0.25 MG/5ML
0.2500 mg | Freq: Once | INTRAVENOUS | Status: AC
  Administered 2012-11-04: 0.25 mg via INTRAVENOUS

## 2012-11-04 MED ORDER — PALONOSETRON HCL INJECTION 0.25 MG/5ML
INTRAVENOUS | Status: AC
  Filled 2012-11-04: qty 5

## 2012-11-04 MED ORDER — SODIUM CHLORIDE 0.9 % IJ SOLN
10.0000 mL | INTRAMUSCULAR | Status: DC | PRN
  Administered 2012-11-04: 10 mL
  Filled 2012-11-04: qty 10

## 2012-11-04 MED ORDER — DEXAMETHASONE SODIUM PHOSPHATE 20 MG/5ML IJ SOLN
INTRAMUSCULAR | Status: AC
  Filled 2012-11-04: qty 5

## 2012-11-04 MED ORDER — DEXAMETHASONE SODIUM PHOSPHATE 20 MG/5ML IJ SOLN
12.0000 mg | Freq: Once | INTRAMUSCULAR | Status: AC
  Administered 2012-11-04: 12 mg via INTRAVENOUS

## 2012-11-04 MED ORDER — SODIUM CHLORIDE 0.9 % IV SOLN
500.0000 mg/m2 | Freq: Once | INTRAVENOUS | Status: AC
  Administered 2012-11-04: 1040 mg via INTRAVENOUS
  Filled 2012-11-04: qty 52

## 2012-11-04 MED ORDER — FLUOROURACIL CHEMO INJECTION 2.5 GM/50ML
500.0000 mg/m2 | Freq: Once | INTRAVENOUS | Status: AC
  Administered 2012-11-04: 1050 mg via INTRAVENOUS
  Filled 2012-11-04: qty 21

## 2012-11-04 MED ORDER — FOSAPREPITANT DIMEGLUMINE INJECTION 150 MG
150.0000 mg | Freq: Once | INTRAVENOUS | Status: AC
  Administered 2012-11-04: 150 mg via INTRAVENOUS
  Filled 2012-11-04: qty 5

## 2012-11-04 MED ORDER — EPIRUBICIN HCL CHEMO IV INJECTION 200 MG/100ML
100.0000 mg/m2 | Freq: Once | INTRAVENOUS | Status: AC
  Administered 2012-11-04: 200 mg via INTRAVENOUS
  Filled 2012-11-04: qty 100

## 2012-11-04 NOTE — Telephone Encounter (Signed)
appt made and printed...td 

## 2012-11-04 NOTE — Patient Instructions (Addendum)
[facility] Discharge Instructions for Patients Receiving Chemotherapy  Today you received the following chemotherapy agents Ellence, 5FU and Cytoxan.   To help prevent nausea and vomiting after your treatment, we encourage you to take your nausea medication Compazine and Zofran as directed    If you develop nausea and vomiting that is not controlled by your nausea medication, call the clinic.   BELOW ARE SYMPTOMS THAT SHOULD BE REPORTED IMMEDIATELY:  *FEVER GREATER THAN 100.5 F  *CHILLS WITH OR WITHOUT FEVER  NAUSEA AND VOMITING THAT IS NOT CONTROLLED WITH YOUR NAUSEA MEDICATION  *UNUSUAL SHORTNESS OF BREATH  *UNUSUAL BRUISING OR BLEEDING  TENDERNESS IN MOUTH AND THROAT WITH OR WITHOUT PRESENCE OF ULCERS  *URINARY PROBLEMS  *BOWEL PROBLEMS  UNUSUAL RASH Items with * indicate a potential emergency and should be followed up as soon as possible.  Feel free to call the clinic you have any questions or concerns. The clinic phone number is [phone].

## 2012-11-04 NOTE — Progress Notes (Signed)
OFFICE PROGRESS NOTE  CC**  DAUB, STEVE A, MD [address] [place] [address]  DIAGNOSIS: 42 year old female with ER positive, PR positive HER-2/neu neg invasive ductal carcinoma of the right breast.    PRIOR THERAPY: 1.  Patient went for her initial screening mammogram at the insistence of her sister. She was found to have a mass in the right breast. The ultrasound demonstrated a 1.4 cm area of concern. She had an MRI performed that revealed a 2.7 cm irregular area at the 6:00 location. There were no masses in the left breast no abnormal lymph nodes. Patient had a biopsy performed of the right breast that revealed an intermediate to high-grade invasive ductal carcinoma the tumor was ER +99% PR +100% HER-2/neu negative with a Ki-67 of 70%.   2. Patient was started on neoadjuvant chemotherapy.  She started dose dense FEC on 10/07/12  CURRENT THERAPY: FEC cycle 3 day 1  INTERVAL HISTORY: Sherlon Nied 42 y.o. female returns for follow up today prior to her third cycle of FEC.  She is doing well today.  She does have pain on the left side of her port that is extending up her neck.  She continues to have mild intermittent rectal bleeding.  She has blood streaked stools after a bowel movement.  She continues to take Colace BID, and applies andy ointment daily.  She denies fevers, chills, nausea, vomiting, constipation, diarrhea, numbness or any further concerns.    MEDICAL HISTORY: Past Medical History  Diagnosis Date  . Anemia   . Thyroid disease   . Wears glasses   . Breast cancer 2014    ER+/PR+/Her2-    ALLERGIES:  has No Known Allergies.  MEDICATIONS:  Current Outpatient Prescriptions  Medication Sig Dispense Refill  . dexamethasone (DECADRON) 4 MG tablet Take 2 tablets by mouth once a day on the day after chemotherapy and then take 2 tablets two times a day for 2 days. Take with food.  30 tablet  1  . docusate sodium (COLACE) 100 MG capsule Take 100 mg by mouth 2 (two)  times daily.      . folic acid (FOLVITE) 400 MCG tablet Take 400 mcg by mouth daily.      Marland Kitchen lidocaine-prilocaine (EMLA) cream Apply topically as needed.  30 g  6  . LORazepam (ATIVAN) 0.5 MG tablet Take 1 tablet (0.5 mg total) by mouth every 6 (six) hours as needed (Nausea or vomiting).  30 tablet  0  . Melatonin 3 MG TABS Take 3 mg by mouth daily.      . ondansetron (ZOFRAN ODT) 4 MG disintegrating tablet 4mg  ODT q4 hours prn nausea/vomit  4 tablet  0  . Prenatal Vit-Fe Fumarate-FA (PRENATAL MULTIVITAMIN) TABS tablet Take 1 tablet by mouth daily at 12 noon.      . valACYclovir (VALTREX) 500 MG tablet Take 1 tablet (500 mg total) by mouth 2 (two) times daily.  20 tablet  4  . oxyCODONE (OXY IR/ROXICODONE) 5 MG immediate release tablet Take 1 tablet (5 mg total) by mouth every 4 (four) hours as needed for pain.  30 tablet  0  . prochlorperazine (COMPAZINE) 10 MG tablet Take 1 tablet (10 mg total) by mouth every 6 (six) hours as needed (Nausea or vomiting).  30 tablet  1   No current facility-administered medications for this visit.    SURGICAL HISTORY:  Past Surgical History  Procedure Laterality Date  . Wisdom tooth extraction    .  Portacath placement Left 09/20/2012    Procedure: INSERTION PORT-A-CATH;  Surgeon: Emelia Loron, MD;  Location: [facility];  Service: General;  Laterality: Left;    REVIEW OF SYSTEMS:  A 10 point review of systems was conducted and is otherwise negative except for what is noted above.    PHYSICAL EXAMINATION: Blood pressure 135/64, pulse 80, temperature 98.2 F (36.8 C), temperature source Oral, resp. rate 20, height 5\' 5"  (1.651 m), weight 202 lb 1.6 oz (91.672 kg). Body mass index is 33.63 kg/(m^2). General: Patient is a well appearing female in no acute distress HEENT: PERRLA, sclerae anicteric no conjunctival pallor, MMM Neck: supple, no palpable adenopathy Lungs: clear to auscultation bilaterally, no wheezes, rhonchi, or  rales Cardiovascular: regular rate rhythm, S1, S2, no murmurs, rubs or gallops Abdomen: Soft, non-tender, non-distended, normoactive bowel sounds, no HSM Extremities: warm and well perfused, no clubbing, cyanosis, or edema Skin: No rashes or lesions Breasts: right breast, soft and palpable mass, left breast no masses, nipple discharge, or nodularity.  ECOG PERFORMANCE STATUS: 1 - Symptomatic but completely ambulatory      LABORATORY DATA: Lab Results  Component Value Date   WBC 14.4* 11/04/2012   HGB 8.8* 11/04/2012   HCT 27.3* 11/04/2012   MCV 84.3 11/04/2012   PLT 186 11/04/2012      Chemistry      Component Value Date/Time   NA 141 11/04/2012 1311   NA 136 10/26/2012 0918   K 4.0 11/04/2012 1311   K 3.3* 10/26/2012 0918   CL 101 10/26/2012 0918   CO2 25 11/04/2012 1311   CO2 27 10/26/2012 0918   BUN 5.8* 11/04/2012 1311   BUN 15 10/26/2012 0918   CREATININE 0.6 11/04/2012 1311   CREATININE 0.60 10/26/2012 0918      Component Value Date/Time   CALCIUM 9.2 11/04/2012 1311   CALCIUM 8.4 10/26/2012 0918   ALKPHOS 96 11/04/2012 1311   ALKPHOS 118* 10/26/2012 0918   AST 15 11/04/2012 1311   AST 12 10/26/2012 0918   ALT 15 11/04/2012 1311   ALT 18 10/26/2012 0918   BILITOT 0.86 11/04/2012 1311   BILITOT 0.6 10/26/2012 0918       RADIOGRAPHIC STUDIES:  Nm Pet Image Initial (pi) Skull Base To Thigh  09/26/2012   *RADIOLOGY REPORT*  Clinical Data: Initial treatment strategy for staging of right- sided breast cancer.  NUCLEAR MEDICINE PET SKULL BASE TO THIGH  Fasting Blood Glucose:  95  Technique:  19.3 mCi F-18 FDG was injected intravenously. CT data was obtained and used for attenuation correction and anatomic localization only.  (This was not acquired as a diagnostic CT examination.) Additional exam technical data entered on technologist worksheet.  Comparison:  Breast MR 09/09/2012  Findings:  Neck: No abnormal hypermetabolism.  Chest:  Medial right breast primary.  This measures 1.5 cm and a  S.U.V. max of 10.2 on image 103/series 2.  No axillary hypermetabolic adenopathy.  No hypermetabolic thoracic nodes.  Abdomen/Pelvis:  No abnormal hypermetabolism.  Skeleton:  There is diffuse mildly increased marrow activity.  No correlate on the CT images.  CT  images performed for attenuation correction demonstrate no significant findings within the neck.  A left-sided Port-A-Cath which terminates at the mid SVC.  No axillary or subpectoral adenopathy. Normal adrenal glands.  Mild hepatic steatosis.  IMPRESSION:  1.  Hypermetabolic medial right breast primary, without evidence of thoracic nodal or extrathoracic disease. 2.  Mildly increased marrow activity diffusely.  If the patient  has already received chemotherapy, this is likely secondary to marrow stimulation.  If not, it is favored to be within normal variation but warrants followup attention. 3.  Hepatic steatosis.   Original Report Authenticated By: Jeronimo Greaves, M.D.   Dg Chest Port 1 View  09/20/2012   *RADIOLOGY REPORT*  Clinical Data: Port-A-Cath placement.  PORTABLE CHEST - 1 VIEW  Comparison: None.  Findings: Left subclavian Port-A-Cath tip projects over the SVC. No definite pneumothorax.  Lungs are low in volume with linear subsegmental atelectasis in the medial left lower lobe.  No definite pleural fluid.  Heart size is accentuated by AP semi upright technique.  IMPRESSION:  1.  Left subclavian Port-A-Cath placement without definite complicating feature given somewhat apical lordotic view, low lung volumes and body habitus. 2.  Linear subsegmental atelectasis in the medial left lower lobe.   Original Report Authenticated By: Leanna Battles, M.D.   Dg Fluoro Guide Cv Line-no Report  09/20/2012   CLINICAL DATA: Port A Cath Insertion - [facility] - OR 5   FLOURO GUIDE CV LINE  Fluoroscopy was utilized by the requesting physician.  No radiographic  interpretation.     ASSESSMENT:  42 year old female with   #1 initial yearly mammogram found to have  an abnormal mass in the right breast ultrasound confirmed the mass to be 1.4 cm at the 6:00 position MRI revealed the mass to be a little bit bigger at 2.7 cm. Biopsy revealed an invasive ductal carcinoma grade 2 ER positive PR positive HER-2/neu negative with a Ki-67 of 70%.   #2 patient desires breast conservation and she will receive neoadjuvant chemotherapy initially consisting of FEC q. 2 weeks for a total of 6 cycles followed by Taxol weekly for a total of 12 weeks. Risks benefits and side effects of this treatment were discussed with the patient.   #3 patient will need antiestrogen therapy since her tumor is estrogen receptor positive.The working plan is for her to receive Tamoxifen for 10 years.    #4 patient will also need radiation therapy adjuvantly and has seen Dr. Basilio Cairo.   PLAN:  1. Patient is doing well.  Labs are stable today.  I discussed them with her in detail.  She will proceed with cycle 3 of FEC.    2. Her rectal bleeding is much improved with Colace BID, she will continue this.    3. She will return next week for labs, and an appointment for evaluation for chemo toxicities.   All questions were answered. The patient knows to call the clinic with any problems, questions or concerns. We can certainly see the patient much sooner if necessary.  I spent 25 minutes counseling the patient face to face. The total time spent in the appointment was 30 minutes.  Cherie Ouch Lyn Hollingshead, NP Medical Oncology [facility] Phone: [phone] 11/05/2012, 12:09 PM

## 2012-11-05 ENCOUNTER — Ambulatory Visit (HOSPITAL_BASED_OUTPATIENT_CLINIC_OR_DEPARTMENT_OTHER): Payer: BC Managed Care – PPO

## 2012-11-05 VITALS — BP 114/62 | HR 80 | Temp 97.9°F

## 2012-11-05 DIAGNOSIS — C50919 Malignant neoplasm of unspecified site of unspecified female breast: Secondary | ICD-10-CM

## 2012-11-05 DIAGNOSIS — Z5189 Encounter for other specified aftercare: Secondary | ICD-10-CM

## 2012-11-05 DIAGNOSIS — C50511 Malignant neoplasm of lower-outer quadrant of right female breast: Secondary | ICD-10-CM

## 2012-11-05 MED ORDER — PEGFILGRASTIM INJECTION 6 MG/0.6ML
6.0000 mg | Freq: Once | SUBCUTANEOUS | Status: AC
  Administered 2012-11-05: 6 mg via SUBCUTANEOUS

## 2012-11-05 NOTE — Patient Instructions (Signed)

## 2012-11-07 ENCOUNTER — Ambulatory Visit (HOSPITAL_COMMUNITY)
Admission: RE | Admit: 2012-11-07 | Discharge: 2012-11-07 | Disposition: A | Discharge status: Home / Self Care | Payer: BC Managed Care – PPO | Source: Ambulatory Visit | Attending: Oncology | Admitting: Oncology

## 2012-11-07 DIAGNOSIS — M79609 Pain in unspecified limb: Secondary | ICD-10-CM

## 2012-11-07 DIAGNOSIS — M25519 Pain in unspecified shoulder: Secondary | ICD-10-CM | POA: Insufficient documentation

## 2012-11-07 DIAGNOSIS — C50511 Malignant neoplasm of lower-outer quadrant of right female breast: Secondary | ICD-10-CM

## 2012-11-07 DIAGNOSIS — M542 Cervicalgia: Secondary | ICD-10-CM

## 2012-11-07 DIAGNOSIS — C50919 Malignant neoplasm of unspecified site of unspecified female breast: Secondary | ICD-10-CM | POA: Insufficient documentation

## 2012-11-07 NOTE — Progress Notes (Signed)
VASCULAR LAB PRELIMINARY  PRELIMINARY  PRELIMINARY  PRELIMINARY  Left upper extremity venous duplex completed.    Preliminary report:  Left:  No evidence of DVT or superficial thrombosis.    Nicholl Onstott, RVT 11/07/2012, 9:52 AM

## 2012-11-09 NOTE — Progress Notes (Signed)
OFFICE PROGRESS NOTE  CC  DAUB, STEVE A, MD [address] [place] [address]  DIAGNOSIS: 42 year old female with ER positive, PR positive HER-2/neu neg invasive ductal carcinoma of the right breast.    PRIOR THERAPY: 1.  Patient went for her initial screening mammogram at the insistence of her sister. She was found to have a mass in the right breast. The ultrasound demonstrated a 1.4 cm area of concern. She had an MRI performed that revealed a 2.7 cm irregular area at the 6:00 location. There were no masses in the left breast no abnormal lymph nodes. Patient had a biopsy performed of the right breast that revealed an intermediate to high-grade invasive ductal carcinoma the tumor was ER +99% PR +100% HER-2/neu negative with a Ki-67 of 70%.   2. Patient was started on neoadjuvant chemotherapy.  She started dose dense FEC on 10/07/12  CURRENT THERAPY: FEC cycle 2 day 8  INTERVAL HISTORY: Hiyab Nhem 42 y.o. female returns for follow up today after receiving cycle 2 of her chemotherapy. Thus far she is tolerating it reasonably well. She does develop some nausea off and on. She has antiemetics that she is taking and that certainly does help her. She does complain of some fatigue but it is very controlled. Today she denies any headaches double vision blurring of vision no fevers chills or night sweats no peripheral paresthesias. Her absolute neutrophil count is acceptable and platelets are normal today. Remainder of the 10 point review of systems is negative.  MEDICAL HISTORY: Past Medical History  Diagnosis Date  . Anemia   . Thyroid disease   . Wears glasses   . Breast cancer 2014    ER+/PR+/Her2-    ALLERGIES:  has No Known Allergies.  MEDICATIONS:  Current Outpatient Prescriptions  Medication Sig Dispense Refill  . dexamethasone (DECADRON) 4 MG tablet Take 2 tablets by mouth once a day on the day after chemotherapy and then take 2 tablets two times a day for 2 days. Take  with food.  30 tablet  1  . docusate sodium (COLACE) 100 MG capsule Take 100 mg by mouth 2 (two) times daily.      . folic acid (FOLVITE) 400 MCG tablet Take 400 mcg by mouth daily.      Marland Kitchen lidocaine-prilocaine (EMLA) cream Apply topically as needed.  30 g  6  . LORazepam (ATIVAN) 0.5 MG tablet Take 1 tablet (0.5 mg total) by mouth every 6 (six) hours as needed (Nausea or vomiting).  30 tablet  0  . Melatonin 3 MG TABS Take 3 mg by mouth daily.      . ondansetron (ZOFRAN ODT) 4 MG disintegrating tablet 4mg  ODT q4 hours prn nausea/vomit  4 tablet  0  . oxyCODONE (OXY IR/ROXICODONE) 5 MG immediate release tablet Take 1 tablet (5 mg total) by mouth every 4 (four) hours as needed for pain.  30 tablet  0  . Prenatal Vit-Fe Fumarate-FA (PRENATAL MULTIVITAMIN) TABS tablet Take 1 tablet by mouth daily at 12 noon.      . prochlorperazine (COMPAZINE) 10 MG tablet Take 1 tablet (10 mg total) by mouth every 6 (six) hours as needed (Nausea or vomiting).  30 tablet  1  . valACYclovir (VALTREX) 500 MG tablet Take 1 tablet (500 mg total) by mouth 2 (two) times daily.  20 tablet  4   No current facility-administered medications for this visit.    SURGICAL HISTORY:  Past Surgical History  Procedure Laterality  Date  . Wisdom tooth extraction    . Portacath placement Left 09/20/2012    Procedure: INSERTION PORT-A-CATH;  Surgeon: Emelia Loron, MD;  Location: [facility];  Service: General;  Laterality: Left;    REVIEW OF SYSTEMS:  A 10 point review of systems was conducted and is otherwise negative except for what is noted above.    PHYSICAL EXAMINATION: Blood pressure 145/64, pulse 89, temperature 98.3 F (36.8 C), temperature source Oral, resp. rate 20, height 5\' 5"  (1.651 m), weight 199 lb 4.8 oz (90.402 kg). Body mass index is 33.17 kg/(m^2). General: Patient is a well appearing female in no acute distress HEENT: PERRLA, sclerae anicteric no conjunctival pallor, MMM Neck: supple, no  palpable adenopathy Lungs: clear to auscultation bilaterally, no wheezes, rhonchi, or rales Cardiovascular: regular rate rhythm, S1, S2, no murmurs, rubs or gallops Abdomen: Soft, non-tender, non-distended, normoactive bowel sounds, no HSM Extremities: warm and well perfused, no clubbing, cyanosis, or edema Skin: No rashes or lesions Breasts: right breast, soft and palpable mass, left breast no masses, nipple discharge, or nodularity.  ECOG PERFORMANCE STATUS: 1 - Symptomatic but completely ambulatory      LABORATORY DATA: Lab Results  Component Value Date   WBC 14.4* 11/04/2012   HGB 8.8* 11/04/2012   HCT 27.3* 11/04/2012   MCV 84.3 11/04/2012   PLT 186 11/04/2012      Chemistry      Component Value Date/Time   NA 141 11/04/2012 1311   NA 136 10/26/2012 0918   K 4.0 11/04/2012 1311   K 3.3* 10/26/2012 0918   CL 101 10/26/2012 0918   CO2 25 11/04/2012 1311   CO2 27 10/26/2012 0918   BUN 5.8* 11/04/2012 1311   BUN 15 10/26/2012 0918   CREATININE 0.6 11/04/2012 1311   CREATININE 0.60 10/26/2012 0918      Component Value Date/Time   CALCIUM 9.2 11/04/2012 1311   CALCIUM 8.4 10/26/2012 0918   ALKPHOS 96 11/04/2012 1311   ALKPHOS 118* 10/26/2012 0918   AST 15 11/04/2012 1311   AST 12 10/26/2012 0918   ALT 15 11/04/2012 1311   ALT 18 10/26/2012 0918   BILITOT 0.86 11/04/2012 1311   BILITOT 0.6 10/26/2012 0918       RADIOGRAPHIC STUDIES:  Nm Pet Image Initial (pi) Skull Base To Thigh  09/26/2012   *RADIOLOGY REPORT*  Clinical Data: Initial treatment strategy for staging of right- sided breast cancer.  NUCLEAR MEDICINE PET SKULL BASE TO THIGH  Fasting Blood Glucose:  95  Technique:  19.3 mCi F-18 FDG was injected intravenously. CT data was obtained and used for attenuation correction and anatomic localization only.  (This was not acquired as a diagnostic CT examination.) Additional exam technical data entered on technologist worksheet.  Comparison:  Breast MR 09/09/2012  Findings:  Neck: No  abnormal hypermetabolism.  Chest:  Medial right breast primary.  This measures 1.5 cm and a S.U.V. max of 10.2 on image 103/series 2.  No axillary hypermetabolic adenopathy.  No hypermetabolic thoracic nodes.  Abdomen/Pelvis:  No abnormal hypermetabolism.  Skeleton:  There is diffuse mildly increased marrow activity.  No correlate on the CT images.  CT  images performed for attenuation correction demonstrate no significant findings within the neck.  A left-sided Port-A-Cath which terminates at the mid SVC.  No axillary or subpectoral adenopathy. Normal adrenal glands.  Mild hepatic steatosis.  IMPRESSION:  1.  Hypermetabolic medial right breast primary, without evidence of thoracic nodal or extrathoracic disease. 2.  Mildly increased marrow activity diffusely.  If the patient has already received chemotherapy, this is likely secondary to marrow stimulation.  If not, it is favored to be within normal variation but warrants followup attention. 3.  Hepatic steatosis.   Original Report Authenticated By: Jeronimo Greaves, M.D.   Dg Chest Port 1 View  09/20/2012   *RADIOLOGY REPORT*  Clinical Data: Port-A-Cath placement.  PORTABLE CHEST - 1 VIEW  Comparison: None.  Findings: Left subclavian Port-A-Cath tip projects over the SVC. No definite pneumothorax.  Lungs are low in volume with linear subsegmental atelectasis in the medial left lower lobe.  No definite pleural fluid.  Heart size is accentuated by AP semi upright technique.  IMPRESSION:  1.  Left subclavian Port-A-Cath placement without definite complicating feature given somewhat apical lordotic view, low lung volumes and body habitus. 2.  Linear subsegmental atelectasis in the medial left lower lobe.   Original Report Authenticated By: Leanna Battles, M.D.   Dg Fluoro Guide Cv Line-no Report  09/20/2012   CLINICAL DATA: Port A Cath Insertion - [facility] - OR 5   FLOURO GUIDE CV LINE  Fluoroscopy was utilized by the requesting physician.  No radiographic   interpretation.     ASSESSMENT:  42 year old female with   #1 initial yearly mammogram found to have an abnormal mass in the right breast ultrasound confirmed the mass to be 1.4 cm at the 6:00 position MRI revealed the mass to be a little bit bigger at 2.7 cm. Biopsy revealed an invasive ductal carcinoma grade 2 ER positive PR positive HER-2/neu negative with a Ki-67 of 70%.   #2 patient desires breast conservation and she will receive neoadjuvant chemotherapy initially consisting of FEC q. 2 weeks for a total of 6 cycles followed by Taxol weekly for a total of 12 weeks. Risks benefits and side effects of this treatment were discussed with the patient.   #3 patient will need antiestrogen therapy since her tumor is estrogen receptor positive.The working plan is for her to receive Tamoxifen for 10 years.    #4 patient will also need radiation therapy adjuvantly and has seen Dr. Basilio Cairo.   PLAN:  1. Patient is doing well.  Labs are stable today. She tolerated cycle 2 of FEC well.  2. Her rectal bleeding is much improved with Colace BID, she will continue this.    3. Her iron studies are essentially normal.  She will continue an oral over the counter iron supplement daily.   4. patient will return in one week's time for cycle 3 of her chemotherapy. All questions were answered. The patient knows to call the clinic with any problems, questions or concerns. We can certainly see the patient much sooner if necessary.  I spent 25 minutes counseling the patient face to face. The total time spent in the appointment was 30 minutes.  Drue Second, MD Medical/Oncology [facility] [phone] (beeper) [phone] (Office)

## 2012-11-11 ENCOUNTER — Ambulatory Visit (HOSPITAL_BASED_OUTPATIENT_CLINIC_OR_DEPARTMENT_OTHER): Payer: BC Managed Care – PPO | Admitting: Physician Assistant

## 2012-11-11 ENCOUNTER — Other Ambulatory Visit (HOSPITAL_BASED_OUTPATIENT_CLINIC_OR_DEPARTMENT_OTHER): Payer: BC Managed Care – PPO | Admitting: Lab

## 2012-11-11 ENCOUNTER — Telehealth: Payer: Self-pay | Admitting: Oncology

## 2012-11-11 VITALS — BP 111/58 | HR 86 | Temp 97.6°F | Resp 18 | Ht 65.0 in | Wt 202.1 lb

## 2012-11-11 DIAGNOSIS — Z170 Estrogen receptor positive status [ER+]: Secondary | ICD-10-CM

## 2012-11-11 DIAGNOSIS — C50919 Malignant neoplasm of unspecified site of unspecified female breast: Secondary | ICD-10-CM

## 2012-11-11 DIAGNOSIS — F411 Generalized anxiety disorder: Secondary | ICD-10-CM

## 2012-11-11 DIAGNOSIS — K625 Hemorrhage of anus and rectum: Secondary | ICD-10-CM

## 2012-11-11 DIAGNOSIS — C50511 Malignant neoplasm of lower-outer quadrant of right female breast: Secondary | ICD-10-CM

## 2012-11-11 LAB — CBC WITH DIFFERENTIAL/PLATELET
Basophils Absolute: 0 10*3/uL (ref 0.0–0.1)
EOS%: 0.6 % (ref 0.0–7.0)
Eosinophils Absolute: 0 10*3/uL (ref 0.0–0.5)
HCT: 23.9 % — ABNORMAL LOW (ref 34.8–46.6)
HGB: 8.1 g/dL — ABNORMAL LOW (ref 11.6–15.9)
LYMPH%: 9.7 % — ABNORMAL LOW (ref 14.0–49.7)
MCH: 27.6 pg (ref 25.1–34.0)
MCV: 81.4 fL (ref 79.5–101.0)
MONO%: 1.3 % (ref 0.0–14.0)
NEUT#: 3.6 10*3/uL (ref 1.5–6.5)
NEUT%: 88.2 % — ABNORMAL HIGH (ref 38.4–76.8)
Platelets: 156 10*3/uL (ref 145–400)

## 2012-11-11 LAB — COMPREHENSIVE METABOLIC PANEL (CC13)
AST: 8 U/L (ref 5–34)
Albumin: 3.4 g/dL — ABNORMAL LOW (ref 3.5–5.0)
Alkaline Phosphatase: 99 U/L (ref 40–150)
Anion Gap: 8 mEq/L (ref 3–11)
BUN: 8.4 mg/dL (ref 7.0–26.0)
Creatinine: 0.7 mg/dL (ref 0.6–1.1)
Glucose: 140 mg/dl (ref 70–140)

## 2012-11-11 MED ORDER — LORAZEPAM 0.5 MG PO TABS: 0.5000 mg | ORAL_TABLET | Freq: Four times a day (QID) | ORAL | Status: DC | PRN

## 2012-11-11 NOTE — Telephone Encounter (Signed)
appts already scheduled per prev POF printed cal and avs pt aware shh

## 2012-11-15 NOTE — Patient Instructions (Signed)
Followup as scheduled on 11/18/2012 for another symptom management visit, repeat labs and your next scheduled cycle of chemotherapy

## 2012-11-15 NOTE — Progress Notes (Signed)
OFFICE PROGRESS NOTE  CC**  DAUB, STEVE A, MD [address] [place] [address]  DIAGNOSIS: 42 year old female with ER positive, PR positive HER-2/neu neg invasive ductal carcinoma of the right breast.    PRIOR THERAPY: 1.  Patient went for her initial screening mammogram at the insistence of her sister. She was found to have a mass in the right breast. The ultrasound demonstrated a 1.4 cm area of concern. She had an MRI performed that revealed a 2.7 cm irregular area at the 6:00 location. There were no masses in the left breast no abnormal lymph nodes. Patient had a biopsy performed of the right breast that revealed an intermediate to high-grade invasive ductal carcinoma the tumor was ER +99% PR +100% HER-2/neu negative with a Ki-67 of 70%.   2. Patient was started on neoadjuvant chemotherapy.  She started dose dense FEC on 10/07/12  CURRENT THERAPY: FEC cycle 3 day 1, status post 3 cycles  INTERVAL HISTORY: Mary Dougherty 42 y.o. female returns for follow up today prior to her fourth cycle of FEC that is scheduled for 11/18/12. She requests a refill for her Ativan that she takes for anxiety. Currently she is taking 0.5 mg twice daily with good management of her symptoms. She's had some difficulty with constipation and irritation of known hemorrhoids. She has tried preparation H. as well as A and D. Ointment. She currently takes some stool softener twice a day. Overall she's tolerating her chemotherapy without significant difficulty. She does report feeling nauseous while driving so she no longer drives.  She continues to have mild intermittent rectal bleeding. She denies fevers, chills, nausea, vomiting, constipation, diarrhea, numbness. She denied any other specific complaints today.    MEDICAL HISTORY: Past Medical History  Diagnosis Date  . Anemia   . Thyroid disease   . Wears glasses   . Breast cancer 2014    ER+/PR+/Her2-    ALLERGIES:  has No Known  Allergies.  MEDICATIONS:  Current Outpatient Prescriptions  Medication Sig Dispense Refill  . dexamethasone (DECADRON) 4 MG tablet Take 2 tablets by mouth once a day on the day after chemotherapy and then take 2 tablets two times a day for 2 days. Take with food.  30 tablet  1  . docusate sodium (COLACE) 100 MG capsule Take 100 mg by mouth 3 (three) times daily as needed.       . folic acid (FOLVITE) 400 MCG tablet Take 400 mcg by mouth daily.      Marland Kitchen lidocaine-prilocaine (EMLA) cream Apply topically as needed.  30 g  6  . LORazepam (ATIVAN) 0.5 MG tablet Take 1 tablet (0.5 mg total) by mouth every 6 (six) hours as needed (Nausea or vomiting).  30 tablet  0  . Melatonin 3 MG TABS Take 3 mg by mouth daily.      . ondansetron (ZOFRAN ODT) 4 MG disintegrating tablet 4mg  ODT q4 hours prn nausea/vomit  4 tablet  0  . oxyCODONE (OXY IR/ROXICODONE) 5 MG immediate release tablet Take 1 tablet (5 mg total) by mouth every 4 (four) hours as needed for pain.  30 tablet  0  . Prenatal Vit-Fe Fumarate-FA (PRENATAL MULTIVITAMIN) TABS tablet Take 1 tablet by mouth daily at 12 noon.      . prochlorperazine (COMPAZINE) 10 MG tablet Take 1 tablet (10 mg total) by mouth every 6 (six) hours as needed (Nausea or vomiting).  30 tablet  1   No current facility-administered medications for this  visit.    SURGICAL HISTORY:  Past Surgical History  Procedure Laterality Date  . Wisdom tooth extraction    . Portacath placement Left 09/20/2012    Procedure: INSERTION PORT-A-CATH;  Surgeon: Emelia Loron, MD;  Location: [facility];  Service: General;  Laterality: Left;    REVIEW OF SYSTEMS:  A 10 point review of systems was conducted and is otherwise negative except for what is noted above.    PHYSICAL EXAMINATION: Blood pressure 111/58, pulse 86, temperature 97.6 F (36.4 C), temperature source Oral, resp. rate 18, height 5\' 5"  (1.651 m), weight 202 lb 1.6 oz (91.672 kg). Body mass index is 33.63  kg/(m^2). General: Patient is a well appearing female in no acute distress HEENT: PERRLA, sclerae anicteric no conjunctival pallor, no evidence of thrush or mucositis  Neck: supple, no palpable adenopathy Lungs: clear to auscultation bilaterally, no wheezes, rhonchi, or rales Cardiovascular: regular rate rhythm, S1, S2, no murmurs, rubs or gallops Abdomen: Soft, non-tender, non-distended, normoactive bowel sounds, no HSM Extremities: warm and well perfused, no clubbing, cyanosis, or edema Skin: No rashes or lesions Breasts: exam deferred   ECOG PERFORMANCE STATUS: 1 - Symptomatic but completely ambulatory      LABORATORY DATA: Lab Results  Component Value Date   WBC 4.1 11/11/2012   HGB 8.1* 11/11/2012   HCT 23.9* 11/11/2012   MCV 81.4 11/11/2012   PLT 156 11/11/2012      Chemistry      Component Value Date/Time   NA 138 11/11/2012 0918   NA 136 10/26/2012 0918   K 3.9 11/11/2012 0918   K 3.3* 10/26/2012 0918   CL 101 10/26/2012 0918   CO2 25 11/11/2012 0918   CO2 27 10/26/2012 0918   BUN 8.4 11/11/2012 0918   BUN 15 10/26/2012 0918   CREATININE 0.7 11/11/2012 0918   CREATININE 0.60 10/26/2012 0918      Component Value Date/Time   CALCIUM 8.7 11/11/2012 0918   CALCIUM 8.4 10/26/2012 0918   ALKPHOS 99 11/11/2012 0918   ALKPHOS 118* 10/26/2012 0918   AST 8 11/11/2012 0918   AST 12 10/26/2012 0918   ALT 15 11/11/2012 0918   ALT 18 10/26/2012 0918   BILITOT 1.00 11/11/2012 0918   BILITOT 0.6 10/26/2012 0918       RADIOGRAPHIC STUDIES:  Nm Pet Image Initial (pi) Skull Base To Thigh  09/26/2012   *RADIOLOGY REPORT*  Clinical Data: Initial treatment strategy for staging of right- sided breast cancer.  NUCLEAR MEDICINE PET SKULL BASE TO THIGH  Fasting Blood Glucose:  95  Technique:  19.3 mCi F-18 FDG was injected intravenously. CT data was obtained and used for attenuation correction and anatomic localization only.  (This was not acquired as a diagnostic CT examination.)  Additional exam technical data entered on technologist worksheet.  Comparison:  Breast MR 09/09/2012  Findings:  Neck: No abnormal hypermetabolism.  Chest:  Medial right breast primary.  This measures 1.5 cm and a S.U.V. max of 10.2 on image 103/series 2.  No axillary hypermetabolic adenopathy.  No hypermetabolic thoracic nodes.  Abdomen/Pelvis:  No abnormal hypermetabolism.  Skeleton:  There is diffuse mildly increased marrow activity.  No correlate on the CT images.  CT  images performed for attenuation correction demonstrate no significant findings within the neck.  A left-sided Port-A-Cath which terminates at the mid SVC.  No axillary or subpectoral adenopathy. Normal adrenal glands.  Mild hepatic steatosis.  IMPRESSION:  1.  Hypermetabolic medial right breast primary, without  evidence of thoracic nodal or extrathoracic disease. 2.  Mildly increased marrow activity diffusely.  If the patient has already received chemotherapy, this is likely secondary to marrow stimulation.  If not, it is favored to be within normal variation but warrants followup attention. 3.  Hepatic steatosis.   Original Report Authenticated By: Jeronimo Greaves, M.D.   Dg Chest Port 1 View  09/20/2012   *RADIOLOGY REPORT*  Clinical Data: Port-A-Cath placement.  PORTABLE CHEST - 1 VIEW  Comparison: None.  Findings: Left subclavian Port-A-Cath tip projects over the SVC. No definite pneumothorax.  Lungs are low in volume with linear subsegmental atelectasis in the medial left lower lobe.  No definite pleural fluid.  Heart size is accentuated by AP semi upright technique.  IMPRESSION:  1.  Left subclavian Port-A-Cath placement without definite complicating feature given somewhat apical lordotic view, low lung volumes and body habitus. 2.  Linear subsegmental atelectasis in the medial left lower lobe.   Original Report Authenticated By: Leanna Battles, M.D.   Dg Fluoro Guide Cv Line-no Report  09/20/2012   CLINICAL DATA: Port A Cath Insertion -  [facility] - OR 5   FLOURO GUIDE CV LINE  Fluoroscopy was utilized by the requesting physician.  No radiographic  interpretation.     ASSESSMENT:  42 year old female with   #1 initial yearly mammogram found to have an abnormal mass in the right breast ultrasound confirmed the mass to be 1.4 cm at the 6:00 position MRI revealed the mass to be a little bit bigger at 2.7 cm. Biopsy revealed an invasive ductal carcinoma grade 2 ER positive PR positive HER-2/neu negative with a Ki-67 of 70%.   #2 patient desires breast conservation and she will receive neoadjuvant chemotherapy initially consisting of FEC q. 2 weeks for a total of 6 cycles followed by Taxol weekly for a total of 12 weeks. Risks benefits and side effects of this treatment were discussed with the patient.   #3 patient will need antiestrogen therapy since her tumor is estrogen receptor positive.The working plan is for her to receive Tamoxifen for 10 years.    #4 patient will also need radiation therapy adjuvantly and has seen Dr. Basilio Cairo.   PLAN:  1. Patient is doing well.  Labs are stable today.  I discussed them with her in detail.      2. Her rectal bleeding is improved somewhat with Colace BID. Patient advised to increase her Colace to 3 times daily and add MiraLAX. She was also encouraged to increase her intake of fluids.   3. She will return next week for labs, a symptom management visit and cycle #4 of her chemotherapy   4. Patient was given a refill for her Ativan 0.5 mg tablets, currently taking one twice daily for anxiety.  All questions were answered. The patient knows to call the clinic with any problems, questions or concerns. We can certainly see the patient much sooner if necessary.  I spent 25 minutes counseling the patient face to face. The total time spent in the appointment was 35 minutes.  Marlana Salvage  Medical Oncology [facility] Phone: [phone] 11/15/2012, 10:14 AM

## 2012-11-18 ENCOUNTER — Ambulatory Visit (HOSPITAL_BASED_OUTPATIENT_CLINIC_OR_DEPARTMENT_OTHER): Payer: BC Managed Care – PPO | Admitting: Adult Health

## 2012-11-18 ENCOUNTER — Telehealth: Payer: Self-pay | Admitting: *Deleted

## 2012-11-18 ENCOUNTER — Encounter: Payer: Self-pay | Admitting: Adult Health

## 2012-11-18 ENCOUNTER — Other Ambulatory Visit: Payer: BC Managed Care – PPO | Admitting: Lab

## 2012-11-18 ENCOUNTER — Other Ambulatory Visit (HOSPITAL_BASED_OUTPATIENT_CLINIC_OR_DEPARTMENT_OTHER): Payer: BC Managed Care – PPO | Admitting: Lab

## 2012-11-18 ENCOUNTER — Ambulatory Visit (HOSPITAL_BASED_OUTPATIENT_CLINIC_OR_DEPARTMENT_OTHER): Payer: BC Managed Care – PPO

## 2012-11-18 VITALS — BP 128/75 | HR 81 | Temp 98.1°F | Resp 18 | Ht 65.0 in | Wt 200.1 lb

## 2012-11-18 DIAGNOSIS — Z5111 Encounter for antineoplastic chemotherapy: Secondary | ICD-10-CM

## 2012-11-18 DIAGNOSIS — C50919 Malignant neoplasm of unspecified site of unspecified female breast: Secondary | ICD-10-CM

## 2012-11-18 DIAGNOSIS — C50511 Malignant neoplasm of lower-outer quadrant of right female breast: Secondary | ICD-10-CM

## 2012-11-18 DIAGNOSIS — K625 Hemorrhage of anus and rectum: Secondary | ICD-10-CM

## 2012-11-18 DIAGNOSIS — J3489 Other specified disorders of nose and nasal sinuses: Secondary | ICD-10-CM

## 2012-11-18 DIAGNOSIS — Z170 Estrogen receptor positive status [ER+]: Secondary | ICD-10-CM

## 2012-11-18 LAB — COMPREHENSIVE METABOLIC PANEL (CC13)
AST: 13 U/L (ref 5–34)
Albumin: 3.8 g/dL (ref 3.5–5.0)
Alkaline Phosphatase: 91 U/L (ref 40–150)
Chloride: 108 mEq/L (ref 98–109)
Creatinine: 0.7 mg/dL (ref 0.6–1.1)
Glucose: 171 mg/dl — ABNORMAL HIGH (ref 70–140)
Potassium: 3.8 mEq/L (ref 3.5–5.1)
Sodium: 141 mEq/L (ref 136–145)
Total Bilirubin: 0.9 mg/dL (ref 0.20–1.20)
Total Protein: 6.9 g/dL (ref 6.4–8.3)

## 2012-11-18 LAB — CBC WITH DIFFERENTIAL/PLATELET
BASO%: 1.4 % (ref 0.0–2.0)
Basophils Absolute: 0.1 10*3/uL (ref 0.0–0.1)
EOS%: 0.4 % (ref 0.0–7.0)
HCT: 28.7 % — ABNORMAL LOW (ref 34.8–46.6)
MCH: 27 pg (ref 25.1–34.0)
MCHC: 32.1 g/dL (ref 31.5–36.0)
MCV: 84.2 fL (ref 79.5–101.0)
MONO%: 7.6 % (ref 0.0–14.0)
RBC: 3.41 10*6/uL — ABNORMAL LOW (ref 3.70–5.45)
RDW: 24.1 % — ABNORMAL HIGH (ref 11.2–14.5)
WBC: 7.9 10*3/uL (ref 3.9–10.3)

## 2012-11-18 MED ORDER — FLUOROURACIL CHEMO INJECTION 2.5 GM/50ML
500.0000 mg/m2 | Freq: Once | INTRAVENOUS | Status: AC
  Administered 2012-11-18: 1050 mg via INTRAVENOUS
  Filled 2012-11-18: qty 21

## 2012-11-18 MED ORDER — PALONOSETRON HCL INJECTION 0.25 MG/5ML
0.2500 mg | Freq: Once | INTRAVENOUS | Status: AC
  Administered 2012-11-18: 0.25 mg via INTRAVENOUS

## 2012-11-18 MED ORDER — CYCLOPHOSPHAMIDE CHEMO INJECTION 1 GM
500.0000 mg/m2 | Freq: Once | INTRAMUSCULAR | Status: AC
  Administered 2012-11-18: 1040 mg via INTRAVENOUS
  Filled 2012-11-18: qty 52

## 2012-11-18 MED ORDER — SODIUM CHLORIDE 0.9 % IV SOLN
Freq: Once | INTRAVENOUS | Status: AC
  Administered 2012-11-18: 10:00:00 via INTRAVENOUS

## 2012-11-18 MED ORDER — HEPARIN SOD (PORK) LOCK FLUSH 100 UNIT/ML IV SOLN
500.0000 [IU] | Freq: Once | INTRAVENOUS | Status: AC | PRN
  Administered 2012-11-18: 500 [IU]
  Filled 2012-11-18: qty 5

## 2012-11-18 MED ORDER — DEXAMETHASONE SODIUM PHOSPHATE 20 MG/5ML IJ SOLN
12.0000 mg | Freq: Once | INTRAMUSCULAR | Status: AC
  Administered 2012-11-18: 12 mg via INTRAVENOUS

## 2012-11-18 MED ORDER — EPIRUBICIN HCL CHEMO IV INJECTION 200 MG/100ML
100.0000 mg/m2 | Freq: Once | INTRAVENOUS | Status: AC
  Administered 2012-11-18: 200 mg via INTRAVENOUS
  Filled 2012-11-18: qty 100

## 2012-11-18 MED ORDER — PALONOSETRON HCL INJECTION 0.25 MG/5ML
INTRAVENOUS | Status: AC
  Filled 2012-11-18: qty 5

## 2012-11-18 MED ORDER — SODIUM CHLORIDE 0.9 % IV SOLN
150.0000 mg | Freq: Once | INTRAVENOUS | Status: AC
  Administered 2012-11-18: 150 mg via INTRAVENOUS
  Filled 2012-11-18: qty 5

## 2012-11-18 MED ORDER — SODIUM CHLORIDE 0.9 % IJ SOLN
10.0000 mL | INTRAMUSCULAR | Status: DC | PRN
  Administered 2012-11-18: 10 mL
  Filled 2012-11-18: qty 10

## 2012-11-18 MED ORDER — DEXAMETHASONE SODIUM PHOSPHATE 20 MG/5ML IJ SOLN
INTRAMUSCULAR | Status: AC
  Filled 2012-11-18: qty 5

## 2012-11-18 NOTE — Patient Instructions (Signed)
[facility] Discharge Instructions for Patients Receiving Chemotherapy  Today you received the following chemotherapy agents Ellence, Adrucil and Cytoxan.  To help prevent nausea and vomiting after your treatment, we encourage you to take your nausea medication.   If you develop nausea and vomiting that is not controlled by your nausea medication, call the clinic.   BELOW ARE SYMPTOMS THAT SHOULD BE REPORTED IMMEDIATELY:  *FEVER GREATER THAN 100.5 F  *CHILLS WITH OR WITHOUT FEVER  NAUSEA AND VOMITING THAT IS NOT CONTROLLED WITH YOUR NAUSEA MEDICATION  *UNUSUAL SHORTNESS OF BREATH  *UNUSUAL BRUISING OR BLEEDING  TENDERNESS IN MOUTH AND THROAT WITH OR WITHOUT PRESENCE OF ULCERS  *URINARY PROBLEMS  *BOWEL PROBLEMS  UNUSUAL RASH Items with * indicate a potential emergency and should be followed up as soon as possible.  Feel free to call the clinic you have any questions or concerns. The clinic phone number is [phone].    

## 2012-11-18 NOTE — Telephone Encounter (Signed)
appts made and printed. Pt is aware that tx will be added. i emailed MW to add the tx's...td 

## 2012-11-18 NOTE — Patient Instructions (Signed)
Doing well.  Proceed with chemotherapy.  Please call us if you have any questions or concerns.    We will see you next week.

## 2012-11-18 NOTE — Telephone Encounter (Signed)
Per staff message and POF I have scheduled appts.  JMW  

## 2012-11-18 NOTE — Progress Notes (Signed)
OFFICE PROGRESS NOTE  CC**  DAUB, STEVE A, MD [address] [place] [address]  DIAGNOSIS: 42 year old female with ER positive, PR positive HER-2/neu neg invasive ductal carcinoma of the right breast.    PRIOR THERAPY: 1.  Patient went for her initial screening mammogram at the insistence of her sister. She was found to have a mass in the right breast. The ultrasound demonstrated a 1.4 cm area of concern. She had an MRI performed that revealed a 2.7 cm irregular area at the 6:00 location. There were no masses in the left breast no abnormal lymph nodes. Patient had a biopsy performed of the right breast that revealed an intermediate to high-grade invasive ductal carcinoma the tumor was ER +99% PR +100% HER-2/neu negative with a Ki-67 of 70%.   2. Patient was started on neoadjuvant chemotherapy.  She started dose dense FEC on 10/07/12  CURRENT THERAPY: FEC cycle 4 day 1  INTERVAL HISTORY: Mary Dougherty 42 y.o. female returns for follow up today prior to her fourth cycle of FEC.  She's doing well today.  She denies fevers, chills, nausea, vomiting, constipation, diarrhea, numbness.  She continues to have mild rectal bleeding during defecation.  She also has a mild runny nose that's been going on, and she thinks it from a co-worker.  Otherwise, she is well and without concerns.    MEDICAL HISTORY: Past Medical History  Diagnosis Date  . Anemia   . Thyroid disease   . Wears glasses   . Breast cancer 2014    ER+/PR+/Her2-    ALLERGIES:  has No Known Allergies.  MEDICATIONS:  Current Outpatient Prescriptions  Medication Sig Dispense Refill  . dexamethasone (DECADRON) 4 MG tablet Take 2 tablets by mouth once a day on the day after chemotherapy and then take 2 tablets two times a day for 2 days. Take with food.  30 tablet  1  . docusate sodium (COLACE) 100 MG capsule Take 100 mg by mouth 3 (three) times daily as needed.       . folic acid (FOLVITE) 400 MCG tablet Take 400 mcg by  mouth daily.      Marland Kitchen lidocaine-prilocaine (EMLA) cream Apply topically as needed.  30 g  6  . LORazepam (ATIVAN) 0.5 MG tablet Take 1 tablet (0.5 mg total) by mouth every 6 (six) hours as needed (Nausea or vomiting).  30 tablet  0  . Melatonin 3 MG TABS Take 3 mg by mouth daily.      . ondansetron (ZOFRAN ODT) 4 MG disintegrating tablet 4mg  ODT q4 hours prn nausea/vomit  4 tablet  0  . oxyCODONE (OXY IR/ROXICODONE) 5 MG immediate release tablet Take 1 tablet (5 mg total) by mouth every 4 (four) hours as needed for pain.  30 tablet  0  . Prenatal Vit-Fe Fumarate-FA (PRENATAL MULTIVITAMIN) TABS tablet Take 1 tablet by mouth daily at 12 noon.      . prochlorperazine (COMPAZINE) 10 MG tablet Take 1 tablet (10 mg total) by mouth every 6 (six) hours as needed (Nausea or vomiting).  30 tablet  1   No current facility-administered medications for this visit.   Facility-Administered Medications Ordered in Other Visits  Medication Dose Route Frequency Provider Last Rate Last Dose  . pegfilgrastim (NEULASTA) injection 6 mg  6 mg Subcutaneous Once Victorino December, MD        SURGICAL HISTORY:  Past Surgical History  Procedure Laterality Date  . Wisdom tooth extraction    .  Portacath placement Left 09/20/2012    Procedure: INSERTION PORT-A-CATH;  Surgeon: Emelia Loron, MD;  Location: [facility];  Service: General;  Laterality: Left;    REVIEW OF SYSTEMS:  A 10 point review of systems was conducted and is otherwise negative except for what is noted above.    PHYSICAL EXAMINATION: Blood pressure 128/75, pulse 81, temperature 98.1 F (36.7 C), temperature source Oral, resp. rate 18, height 5\' 5"  (1.651 m), weight 200 lb 1.6 oz (90.765 kg), SpO2 98.00%. Body mass index is 33.3 kg/(m^2). General: Patient is a well appearing female in no acute distress HEENT: PERRLA, sclerae anicteric no conjunctival pallor, no evidence of thrush or mucositis  Neck: supple, no palpable adenopathy Lungs:  clear to auscultation bilaterally, no wheezes, rhonchi, or rales Cardiovascular: regular rate rhythm, S1, S2, no murmurs, rubs or gallops Abdomen: Soft, non-tender, non-distended, normoactive bowel sounds, no HSM Extremities: warm and well perfused, no clubbing, cyanosis, or edema Skin: No rashes or lesions Breasts: right breast, very soft mass  ECOG PERFORMANCE STATUS: 1 - Symptomatic but completely ambulatory      LABORATORY DATA: Lab Results  Component Value Date   WBC 7.9 11/18/2012   HGB 9.2* 11/18/2012   HCT 28.7* 11/18/2012   MCV 84.2 11/18/2012   PLT 173 11/18/2012      Chemistry      Component Value Date/Time   NA 141 11/18/2012 0827   NA 136 10/26/2012 0918   K 3.8 11/18/2012 0827   K 3.3* 10/26/2012 0918   CL 101 10/26/2012 0918   CO2 22 11/18/2012 0827   CO2 27 10/26/2012 0918   BUN 3.8* 11/18/2012 0827   BUN 15 10/26/2012 0918   CREATININE 0.7 11/18/2012 0827   CREATININE 0.60 10/26/2012 0918      Component Value Date/Time   CALCIUM 9.0 11/18/2012 0827   CALCIUM 8.4 10/26/2012 0918   ALKPHOS 91 11/18/2012 0827   ALKPHOS 118* 10/26/2012 0918   AST 13 11/18/2012 0827   AST 12 10/26/2012 0918   ALT 18 11/18/2012 0827   ALT 18 10/26/2012 0918   BILITOT 0.90 11/18/2012 0827   BILITOT 0.6 10/26/2012 0918       RADIOGRAPHIC STUDIES:  Nm Pet Image Initial (pi) Skull Base To Thigh  09/26/2012   *RADIOLOGY REPORT*  Clinical Data: Initial treatment strategy for staging of right- sided breast cancer.  NUCLEAR MEDICINE PET SKULL BASE TO THIGH  Fasting Blood Glucose:  95  Technique:  19.3 mCi F-18 FDG was injected intravenously. CT data was obtained and used for attenuation correction and anatomic localization only.  (This was not acquired as a diagnostic CT examination.) Additional exam technical data entered on technologist worksheet.  Comparison:  Breast MR 09/09/2012  Findings:  Neck: No abnormal hypermetabolism.  Chest:  Medial right breast primary.  This measures 1.5  cm and a S.U.V. max of 10.2 on image 103/series 2.  No axillary hypermetabolic adenopathy.  No hypermetabolic thoracic nodes.  Abdomen/Pelvis:  No abnormal hypermetabolism.  Skeleton:  There is diffuse mildly increased marrow activity.  No correlate on the CT images.  CT  images performed for attenuation correction demonstrate no significant findings within the neck.  A left-sided Port-A-Cath which terminates at the mid SVC.  No axillary or subpectoral adenopathy. Normal adrenal glands.  Mild hepatic steatosis.  IMPRESSION:  1.  Hypermetabolic medial right breast primary, without evidence of thoracic nodal or extrathoracic disease. 2.  Mildly increased marrow activity diffusely.  If the patient has  already received chemotherapy, this is likely secondary to marrow stimulation.  If not, it is favored to be within normal variation but warrants followup attention. 3.  Hepatic steatosis.   Original Report Authenticated By: Jeronimo Greaves, M.D.   Dg Chest Port 1 View  09/20/2012   *RADIOLOGY REPORT*  Clinical Data: Port-A-Cath placement.  PORTABLE CHEST - 1 VIEW  Comparison: None.  Findings: Left subclavian Port-A-Cath tip projects over the SVC. No definite pneumothorax.  Lungs are low in volume with linear subsegmental atelectasis in the medial left lower lobe.  No definite pleural fluid.  Heart size is accentuated by AP semi upright technique.  IMPRESSION:  1.  Left subclavian Port-A-Cath placement without definite complicating feature given somewhat apical lordotic view, low lung volumes and body habitus. 2.  Linear subsegmental atelectasis in the medial left lower lobe.   Original Report Authenticated By: Leanna Battles, M.D.   Dg Fluoro Guide Cv Line-no Report  09/20/2012   CLINICAL DATA: Port A Cath Insertion - [facility] - OR 5   FLOURO GUIDE CV LINE  Fluoroscopy was utilized by the requesting physician.  No radiographic  interpretation.     ASSESSMENT:  42 year old female with   #1 initial yearly mammogram  found to have an abnormal mass in the right breast ultrasound confirmed the mass to be 1.4 cm at the 6:00 position MRI revealed the mass to be a little bit bigger at 2.7 cm. Biopsy revealed an invasive ductal carcinoma grade 2 ER positive PR positive HER-2/neu negative with a Ki-67 of 70%.   #2 patient desires breast conservation and she will receive neoadjuvant chemotherapy initially consisting of FEC q. 2 weeks for a total of 6 cycles followed by Taxol weekly for a total of 12 weeks. Risks benefits and side effects of this treatment were discussed with the patient.   #3 patient will need antiestrogen therapy since her tumor is estrogen receptor positive.The working plan is for her to receive Tamoxifen for 10 years.    #4 patient will also need radiation therapy adjuvantly and has seen Dr. Basilio Cairo.   PLAN:  1. Ms. Steines is doing well today.  Her labs are stable, she will proceed with chemotherapy today.  We discussed her treatment plan again and will set up more appointments.   2. Her rectal bleeding continues to be stable with Colace and A and D ointment.   3.  Her nasal drainage is slightly improved.  She will let us know if it worsens and if she starts to feel sick or coughing.    4.  Patient will return  Tomorrow for Neulasta and next week for labs and an evaluation for chemotoxicities.    All questions were answered. The patient knows to call the clinic with any problems, questions or concerns. We can certainly see the patient much sooner if necessary.  I spent 25 minutes counseling the patient face to face. The total time spent in the appointment was 35 minutes.   Illa Level, NP Medical Oncology [facility] [phone]  11/19/2012, 10:00 AM

## 2012-11-19 ENCOUNTER — Ambulatory Visit (HOSPITAL_BASED_OUTPATIENT_CLINIC_OR_DEPARTMENT_OTHER): Payer: BC Managed Care – PPO

## 2012-11-19 VITALS — BP 132/68 | HR 88 | Temp 96.5°F | Resp 18

## 2012-11-19 DIAGNOSIS — C50919 Malignant neoplasm of unspecified site of unspecified female breast: Secondary | ICD-10-CM

## 2012-11-19 DIAGNOSIS — Z5189 Encounter for other specified aftercare: Secondary | ICD-10-CM

## 2012-11-19 MED ORDER — PEGFILGRASTIM INJECTION 6 MG/0.6ML
6.0000 mg | Freq: Once | SUBCUTANEOUS | Status: AC
  Administered 2012-11-19: 6 mg via SUBCUTANEOUS

## 2012-11-24 ENCOUNTER — Encounter: Payer: Self-pay | Admitting: Adult Health

## 2012-11-24 ENCOUNTER — Ambulatory Visit (HOSPITAL_BASED_OUTPATIENT_CLINIC_OR_DEPARTMENT_OTHER): Payer: BC Managed Care – PPO | Admitting: Adult Health

## 2012-11-24 ENCOUNTER — Other Ambulatory Visit (HOSPITAL_BASED_OUTPATIENT_CLINIC_OR_DEPARTMENT_OTHER): Payer: BC Managed Care – PPO | Admitting: Lab

## 2012-11-24 VITALS — BP 122/76 | HR 94 | Temp 98.2°F | Resp 18 | Ht 65.0 in | Wt 200.3 lb

## 2012-11-24 DIAGNOSIS — D649 Anemia, unspecified: Secondary | ICD-10-CM

## 2012-11-24 DIAGNOSIS — C50511 Malignant neoplasm of lower-outer quadrant of right female breast: Secondary | ICD-10-CM

## 2012-11-24 DIAGNOSIS — C50919 Malignant neoplasm of unspecified site of unspecified female breast: Secondary | ICD-10-CM

## 2012-11-24 DIAGNOSIS — Z170 Estrogen receptor positive status [ER+]: Secondary | ICD-10-CM

## 2012-11-24 LAB — CBC WITH DIFFERENTIAL/PLATELET
Basophils Absolute: 0 10*3/uL (ref 0.0–0.1)
EOS%: 0.5 % (ref 0.0–7.0)
HGB: 7.8 g/dL — ABNORMAL LOW (ref 11.6–15.9)
LYMPH%: 15.5 % (ref 14.0–49.7)
MCH: 27.5 pg (ref 25.1–34.0)
MCV: 81 fL (ref 79.5–101.0)
MONO%: 1.7 % (ref 0.0–14.0)
NEUT#: 3 10*3/uL (ref 1.5–6.5)
NEUT%: 81.7 % — ABNORMAL HIGH (ref 38.4–76.8)
Platelets: 165 10*3/uL (ref 145–400)
RDW: 24.1 % — ABNORMAL HIGH (ref 11.2–14.5)

## 2012-11-24 LAB — COMPREHENSIVE METABOLIC PANEL (CC13)
AST: 8 U/L (ref 5–34)
Alkaline Phosphatase: 120 U/L (ref 40–150)
Anion Gap: 7 mEq/L (ref 3–11)
BUN: 8.8 mg/dL (ref 7.0–26.0)
Creatinine: 0.6 mg/dL (ref 0.6–1.1)
Potassium: 3.6 mEq/L (ref 3.5–5.1)

## 2012-11-24 NOTE — Patient Instructions (Signed)
Anemia, Frequently Asked Questions WHAT ARE THE SYMPTOMS OF ANEMIA?  Headache.  Difficulty thinking.  Fatigue.  Shortness of breath.  Weakness.  Rapid heartbeat. AT WHAT POINT ARE PEOPLE CONSIDERED ANEMIC?  This varies with gender and age.   Both hemoglobin (Hgb) and hematocrit values are used to define anemia. These lab values are obtained from a complete blood count (CBC) test. This is performed at a caregiver's office.  The normal range of hemoglobin values for adult men is 14.0 g/dL to 17.4 g/dL. For nonpregnant women, values are 12.3 g/dL to 15.3 g/dL.  The World Health Organization defines anemia as less than 12 g/dL for nonpregnant women and less than 13 g/dL for men.  For adult males, the average normal hematocrit is 46%, and the range is 40% to 52%.  For adult females, the average normal hematocrit is 41%, and the range is 35% to 47%.  Values that fall below the lower limits can be a sign of anemia and should have further checking (evaluation). GROUPS OF PEOPLE WHO ARE AT RISK FOR DEVELOPING ANEMIA INCLUDE:   Infants who are breastfed or taking a formula that is not fortified with iron.  Children going through a rapid growth spurt. The iron available can not keep up with the needs for a red cell mass which must grow with the child.  Women in childbearing years. They need iron because of blood loss during menstruation.  Pregnant women. The growing fetus creates a high demand for iron.  People with ongoing gastrointestinal blood loss are at risk of developing iron deficiency.  Individuals with leukemia or cancer who must receive chemotherapy or radiation to treat their disease. The drugs or radiation used to treat these diseases often decreases the bone marrow's ability to make cells of all classes. This includes red blood cells, white blood cells, and platelets.  Individuals with chronic inflammatory conditions such as rheumatoid arthritis or chronic  infections.  The elderly. ARE SOME TYPES OF ANEMIA INHERITED?   Yes, some types of anemia are due to inherited or genetic defects.  Sickle cell anemia. This occurs most often in people of African, African American, and Mediterranean descent.  Thalassemia (or Cooley's anemia). This type is found in people of Mediterranean and Southeast Asian descent. These types of anemia are common.  [place]. This is rare. CAN CERTAIN MEDICATIONS CAUSE A PERSON TO BECOME ANEMIC?  Yes. For example, drugs to fight cancer (chemotherapeutic agents) often cause anemia. These drugs can slow the bone marrow's ability to make red blood cells. If there are not enough red blood cells, the body does not get enough oxygen. WHAT HEMATOCRIT LEVEL IS REQUIRED TO DONATE BLOOD?  The lower limit of an acceptable hematocrit for blood donors is 38%. If you have a low hematocrit value, you should schedule an appointment with your caregiver. ARE BLOOD TRANSFUSIONS COMMONLY USED TO CORRECT ANEMIA, AND ARE THEY DANGEROUS?  They are used to treat anemia as a last resort. Your caregiver will find the cause of the anemia and correct it if possible. Most blood transfusions are given because of excessive bleeding at the time of surgery, with trauma, or because of bone marrow suppression in patients with cancer or leukemia on chemotherapy. Blood transfusions are safer than ever before. We also know that blood transfusions affect the immune system and may increase certain risks. There is also a concern for human error. In 1/16,000 transfusions, a patient receives a transfusion of blood that is not matched with his or her   blood type.  WHAT IS IRON DEFICIENCY ANEMIA AND CAN I CORRECT IT BY CHANGING MY DIET?  Iron is an essential part of hemoglobin. Without enough hemoglobin, anemia develops and the body does not get the right amount of oxygen. Iron deficiency anemia develops after the body has had a low level of iron for a long time. This is  either caused by blood loss, not taking in or absorbing enough iron, or increased demands for iron (like pregnancy or rapid growth).  Foods from animal origin such as beef, chicken, and pork, are good sources of iron. Be sure to have one of these foods at each meal. Vitamin C helps your body absorb iron. Foods rich in Vitamin C include citrus, bell pepper, strawberries, spinach and cantaloupe. In some cases, iron supplements may be needed in order to correct the iron deficiency. In the case of poor absorption, extra iron may have to be given directly into the vein through a needle (intravenously). I HAVE BEEN DIAGNOSED WITH IRON DEFICIENCY ANEMIA AND MY CAREGIVER PRESCRIBED IRON SUPPLEMENTS. HOW LONG WILL IT TAKE FOR MY BLOOD TO BECOME NORMAL?  It depends on the degree of anemia at the beginning of treatment. Most people with mild to moderate iron deficiency, anemia will correct the anemia over a period of 2 to 3 months. But after the anemia is corrected, the iron stored by the body is still low. Caregivers often suggest an additional 6 months of oral iron therapy once the anemia has been reversed. This will help prevent the iron deficiency anemia from quickly happening again. Non-anemic adult males should take iron supplements only under the direction of a doctor, too much iron can cause liver damage.  MY HEMOGLOBIN IS 9 G/DL AND I AM SCHEDULED FOR SURGERY. SHOULD I POSTPONE THE SURGERY?  If you have Hgb of 9, you should discuss this with your caregiver right away. Many patients with similar hemoglobin levels have had surgery without problems. If minimal blood loss is expected for a minor procedure, no treatment may be necessary.  If a greater blood loss is expected for more extensive procedures, you should ask your caregiver about being treated with erythropoietin and iron. This is to accelerate the recovery of your hemoglobin to a normal level before surgery. An anemic patient who undergoes high-blood-loss  surgery has a greater risk of surgical complications and need for a blood transfusion, which also carries some risk.  I HAVE BEEN TOLD THAT HEAVY MENSTRUAL PERIODS CAUSE ANEMIA. IS THERE ANYTHING I CAN DO TO PREVENT THE ANEMIA?  Anemia that results from heavy periods is usually due to iron deficiency. You can try to meet the increased demands for iron caused by the heavy monthly blood loss by increasing the intake of iron-rich foods. Iron supplements may be required. Discuss your concerns with your caregiver. WHAT CAUSES ANEMIA DURING PREGNANCY?  Pregnancy places major demands on the body. The mother must meet the needs of both her body and her growing baby. The body needs enough iron and folate to make the right amount of red blood cells. To prevent anemia while pregnant, the mother should stay in close contact with her caregiver.  Be sure to eat a diet that has foods rich in iron and folate like liver and dark green leafy vegetables. Folate plays an important role in the normal development of a baby's spinal cord. Folate can help prevent serious disorders like spina bifida. If your diet does not provide adequate nutrients, you may want to talk   with your caregiver about nutritional supplements.  WHAT IS THE RELATIONSHIP BETWEEN FIBROID TUMORS AND ANEMIA IN WOMEN?  The relationship is usually caused by the increased menstrual blood loss caused by fibroids. Good iron intake may be required to prevent iron deficiency anemia from developing.  Document Released: 08/28/2003 Document Revised: 04/13/2011 Document Reviewed: 02/11/2010 [employer] Patient Information 2014 [employer].   Blood Products Information This is information about transfusions of blood products. All blood that is to be transfused is tested for blood type, compatibility with the recipient, and for infections. Except in emergencies, giving a transfusion requires a written consent. Blood transfusions are often given as packed red blood  cells. This means the other parts of the blood have been taken out. Blood may be needed to treat severe anemia or bleeding. Other blood products include plasma, platelets, immune globulin, and cryoprecipitate. Blood for transfusion is mostly donated by volunteers. The blood donors are carefully screened for risk factors that could cause disease. Donors are all tested for infections that could be transmitted by blood. The blood product supply today is the safest it has ever been. Some risks do remain.  A minor reaction with fever, chills, or rash happens in about 1% of blood product transfusions.  Life-threatening reactions occur in less than 1 in a million transfusions.  Infection with germs (bacteria), viruses or parasites like malaria can still happen. The risk is very low.  Hepatitis B occurs in about 1 case in 150,000 transfusions.  Hepatitis C is seen once in 500,000.  HIV is transmitted less than once every million transfusions. When you receive a transfusion of packed red blood cells, your blood is tested for blood group and Rh type. Your blood is also screened for antibodies that could cause a serious reaction. A cross-match test is done to make sure the blood is safe to give.  Talk with your caregiver if you have any concerns about receiving a transfusion of blood products. Make sure your questions are answered. Transfusions are not given if your caregiver feels the risk is greater than the need. Document Released: 01/19/2005 Document Revised: 04/13/2011 Document Reviewed: 07/09/2006 [employer] Patient Information 2014 [employer].  

## 2012-11-24 NOTE — Progress Notes (Signed)
OFFICE PROGRESS NOTE  CC**  DAUB, STEVE A, MD [address] [place] [address]  DIAGNOSIS: 42 year old female with ER positive, PR positive HER-2/neu neg invasive ductal carcinoma of the right breast.    PRIOR THERAPY: 1.  Patient went for her initial screening mammogram at the insistence of her sister. She was found to have a mass in the right breast. The ultrasound demonstrated a 1.4 cm area of concern. She had an MRI performed that revealed a 2.7 cm irregular area at the 6:00 location. There were no masses in the left breast no abnormal lymph nodes. Patient had a biopsy performed of the right breast that revealed an intermediate to high-grade invasive ductal carcinoma the tumor was ER +99% PR +100% HER-2/neu negative with a Ki-67 of 70%.   2. Patient was started on neoadjuvant chemotherapy.  She started dose dense FEC on 10/07/12  CURRENT THERAPY: FEC cycle 4 day 7  INTERVAL HISTORY: Mary Dougherty 42 y.o. female returns for follow up today following her fourth cycle of FEC.   She is doing moderately well today.  She denies fevers, chills, vomiting, constipation, numbness.  She did eat japanese food last night and has had 3 episodes of diarrhea, nausea, and abdominal pain since then.  Otherwise, she is well and without concerns.   MEDICAL HISTORY: Past Medical History  Diagnosis Date  . Anemia   . Thyroid disease   . Wears glasses   . Breast cancer 2014    ER+/PR+/Her2-    ALLERGIES:  has No Known Allergies.  MEDICATIONS:  Current Outpatient Prescriptions  Medication Sig Dispense Refill  . dexamethasone (DECADRON) 4 MG tablet Take 2 tablets by mouth once a day on the day after chemotherapy and then take 2 tablets two times a day for 2 days. Take with food.  30 tablet  1  . docusate sodium (COLACE) 100 MG capsule Take 100 mg by mouth 3 (three) times daily as needed.       . folic acid (FOLVITE) 400 MCG tablet Take 400 mcg by mouth daily.      Marland Kitchen lidocaine-prilocaine  (EMLA) cream Apply topically as needed.  30 g  6  . LORazepam (ATIVAN) 0.5 MG tablet Take 1 tablet (0.5 mg total) by mouth every 6 (six) hours as needed (Nausea or vomiting).  30 tablet  0  . Melatonin 3 MG TABS Take 3 mg by mouth daily.      . ondansetron (ZOFRAN ODT) 4 MG disintegrating tablet 4mg  ODT q4 hours prn nausea/vomit  4 tablet  0  . oxyCODONE (OXY IR/ROXICODONE) 5 MG immediate release tablet Take 1 tablet (5 mg total) by mouth every 4 (four) hours as needed for pain.  30 tablet  0  . Prenatal Vit-Fe Fumarate-FA (PRENATAL MULTIVITAMIN) TABS tablet Take 1 tablet by mouth daily at 12 noon.      . prochlorperazine (COMPAZINE) 10 MG tablet Take 1 tablet (10 mg total) by mouth every 6 (six) hours as needed (Nausea or vomiting).  30 tablet  1   No current facility-administered medications for this visit.    SURGICAL HISTORY:  Past Surgical History  Procedure Laterality Date  . Wisdom tooth extraction    . Portacath placement Left 09/20/2012    Procedure: INSERTION PORT-A-CATH;  Surgeon: Emelia Loron, MD;  Location: [facility];  Service: General;  Laterality: Left;    REVIEW OF SYSTEMS:  A 10 point review of systems was conducted and is otherwise negative  except for what is noted above.    PHYSICAL EXAMINATION: Blood pressure 122/76, pulse 94, temperature 98.2 F (36.8 C), temperature source Oral, resp. rate 18, height 5\' 5"  (1.651 m), weight 200 lb 4.8 oz (90.855 kg). Body mass index is 33.33 kg/(m^2). General: Patient is a well appearing female in no acute distress HEENT: PERRLA, sclerae anicteric no conjunctival pallor, no evidence of thrush or mucositis  Neck: supple, no palpable adenopathy Lungs: clear to auscultation bilaterally, no wheezes, rhonchi, or rales Cardiovascular: regular rate rhythm, S1, S2, no murmurs, rubs or gallops Abdomen: Soft, non-tender, non-distended, normoactive bowel sounds, no HSM Extremities: warm and well perfused, no clubbing,  cyanosis, or edema Skin: No rashes or lesions Breasts: right breast, very soft mass  ECOG PERFORMANCE STATUS: 1 - Symptomatic but completely ambulatory      LABORATORY DATA: Lab Results  Component Value Date   WBC 3.6* 11/24/2012   HGB 7.8* 11/24/2012   HCT 23.0* 11/24/2012   MCV 81.0 11/24/2012   PLT 165 11/24/2012      Chemistry      Component Value Date/Time   NA 140 11/24/2012 1504   NA 136 10/26/2012 0918   K 3.6 11/24/2012 1504   K 3.3* 10/26/2012 0918   CL 101 10/26/2012 0918   CO2 25 11/24/2012 1504   CO2 27 10/26/2012 0918   BUN 8.8 11/24/2012 1504   BUN 15 10/26/2012 0918   CREATININE 0.6 11/24/2012 1504   CREATININE 0.60 10/26/2012 0918      Component Value Date/Time   CALCIUM 8.7 11/24/2012 1504   CALCIUM 8.4 10/26/2012 0918   ALKPHOS 120 11/24/2012 1504   ALKPHOS 118* 10/26/2012 0918   AST 8 11/24/2012 1504   AST 12 10/26/2012 0918   ALT 10 11/24/2012 1504   ALT 18 10/26/2012 0918   BILITOT 0.62 11/24/2012 1504   BILITOT 0.6 10/26/2012 0918       RADIOGRAPHIC STUDIES:  Nm Pet Image Initial (pi) Skull Base To Thigh  09/26/2012   *RADIOLOGY REPORT*  Clinical Data: Initial treatment strategy for staging of right- sided breast cancer.  NUCLEAR MEDICINE PET SKULL BASE TO THIGH  Fasting Blood Glucose:  95  Technique:  19.3 mCi F-18 FDG was injected intravenously. CT data was obtained and used for attenuation correction and anatomic localization only.  (This was not acquired as a diagnostic CT examination.) Additional exam technical data entered on technologist worksheet.  Comparison:  Breast MR 09/09/2012  Findings:  Neck: No abnormal hypermetabolism.  Chest:  Medial right breast primary.  This measures 1.5 cm and a S.U.V. max of 10.2 on image 103/series 2.  No axillary hypermetabolic adenopathy.  No hypermetabolic thoracic nodes.  Abdomen/Pelvis:  No abnormal hypermetabolism.  Skeleton:  There is diffuse mildly increased marrow activity.  No correlate on the CT images.   CT  images performed for attenuation correction demonstrate no significant findings within the neck.  A left-sided Port-A-Cath which terminates at the mid SVC.  No axillary or subpectoral adenopathy. Normal adrenal glands.  Mild hepatic steatosis.  IMPRESSION:  1.  Hypermetabolic medial right breast primary, without evidence of thoracic nodal or extrathoracic disease. 2.  Mildly increased marrow activity diffusely.  If the patient has already received chemotherapy, this is likely secondary to marrow stimulation.  If not, it is favored to be within normal variation but warrants followup attention. 3.  Hepatic steatosis.   Original Report Authenticated By: Jeronimo Greaves, M.D.   Dg Chest Port 1 View  09/20/2012   *  RADIOLOGY REPORT*  Clinical Data: Port-A-Cath placement.  PORTABLE CHEST - 1 VIEW  Comparison: None.  Findings: Left subclavian Port-A-Cath tip projects over the SVC. No definite pneumothorax.  Lungs are low in volume with linear subsegmental atelectasis in the medial left lower lobe.  No definite pleural fluid.  Heart size is accentuated by AP semi upright technique.  IMPRESSION:  1.  Left subclavian Port-A-Cath placement without definite complicating feature given somewhat apical lordotic view, low lung volumes and body habitus. 2.  Linear subsegmental atelectasis in the medial left lower lobe.   Original Report Authenticated By: Leanna Battles, M.D.   Dg Fluoro Guide Cv Line-no Report  09/20/2012   CLINICAL DATA: Port A Cath Insertion - [facility] - OR 5   FLOURO GUIDE CV LINE  Fluoroscopy was utilized by the requesting physician.  No radiographic  interpretation.     ASSESSMENT:  42 year old female with   #1 initial yearly mammogram found to have an abnormal mass in the right breast ultrasound confirmed the mass to be 1.4 cm at the 6:00 position MRI revealed the mass to be a little bit bigger at 2.7 cm. Biopsy revealed an invasive ductal carcinoma grade 2 ER positive PR positive HER-2/neu negative  with a Ki-67 of 70%.   #2 patient desires breast conservation and she will receive neoadjuvant chemotherapy initially consisting of FEC q. 2 weeks for a total of 6 cycles followed by Taxol weekly for a total of 12 weeks. Risks benefits and side effects of this treatment were discussed with the patient.   #3 patient will need antiestrogen therapy since her tumor is estrogen receptor positive.The working plan is for her to receive Tamoxifen for 10 years.    #4 patient will also need radiation therapy adjuvantly and has seen Dr. Basilio Cairo.   PLAN:  1.  Patient is doing moderately well.  She is tearful today regarding her abdominal pain, diarrhea.  It is likely her dietary choice last night. Her abdominal exam is benign.  She and I discussed indications for when she should go to the emergency room.    2.  She is anemic today, we reviewed the symptoms of anemia, should she develop these she should call us as she would benefit from a blood transfusion.  She declined a blood transfusion today.    3.  She will return next week for labs, an appointment, and chemotherapy.      All questions were answered. The patient knows to call the clinic with any problems, questions or concerns. We can certainly see the patient much sooner if necessary.  I spent 25 minutes counseling the patient face to face. The total time spent in the appointment was 35 minutes.   Illa Level, NP Medical Oncology [facility] [phone]  11/26/2012, 8:43 AM

## 2012-11-25 ENCOUNTER — Other Ambulatory Visit: Payer: BC Managed Care – PPO | Admitting: Lab

## 2012-11-25 ENCOUNTER — Ambulatory Visit: Payer: BC Managed Care – PPO | Admitting: Adult Health

## 2012-11-28 ENCOUNTER — Encounter: Payer: Self-pay | Admitting: *Deleted

## 2012-11-28 NOTE — Progress Notes (Signed)
Mailed after appt letter to pt. 

## 2012-12-02 ENCOUNTER — Encounter: Payer: Self-pay | Admitting: Family

## 2012-12-02 ENCOUNTER — Ambulatory Visit (HOSPITAL_BASED_OUTPATIENT_CLINIC_OR_DEPARTMENT_OTHER): Payer: BC Managed Care – PPO | Admitting: Family

## 2012-12-02 ENCOUNTER — Ambulatory Visit (HOSPITAL_BASED_OUTPATIENT_CLINIC_OR_DEPARTMENT_OTHER): Payer: BC Managed Care – PPO

## 2012-12-02 ENCOUNTER — Other Ambulatory Visit (HOSPITAL_BASED_OUTPATIENT_CLINIC_OR_DEPARTMENT_OTHER): Payer: BC Managed Care – PPO | Admitting: Lab

## 2012-12-02 VITALS — BP 121/70 | HR 91 | Temp 98.5°F | Resp 18 | Ht 65.0 in | Wt 202.0 lb

## 2012-12-02 DIAGNOSIS — C50919 Malignant neoplasm of unspecified site of unspecified female breast: Secondary | ICD-10-CM

## 2012-12-02 DIAGNOSIS — K922 Gastrointestinal hemorrhage, unspecified: Secondary | ICD-10-CM

## 2012-12-02 DIAGNOSIS — Z5111 Encounter for antineoplastic chemotherapy: Secondary | ICD-10-CM

## 2012-12-02 DIAGNOSIS — C50511 Malignant neoplasm of lower-outer quadrant of right female breast: Secondary | ICD-10-CM

## 2012-12-02 DIAGNOSIS — D638 Anemia in other chronic diseases classified elsewhere: Secondary | ICD-10-CM

## 2012-12-02 LAB — COMPREHENSIVE METABOLIC PANEL (CC13)
AST: 18 U/L (ref 5–34)
Anion Gap: 9 mEq/L (ref 3–11)
BUN: 4.1 mg/dL — ABNORMAL LOW (ref 7.0–26.0)
CO2: 25 mEq/L (ref 22–29)
Calcium: 9.4 mg/dL (ref 8.4–10.4)
Chloride: 107 mEq/L (ref 98–109)
Creatinine: 0.6 mg/dL (ref 0.6–1.1)
Glucose: 81 mg/dl (ref 70–140)
Total Bilirubin: 0.77 mg/dL (ref 0.20–1.20)

## 2012-12-02 LAB — CBC WITH DIFFERENTIAL/PLATELET
BASO%: 2.8 % — ABNORMAL HIGH (ref 0.0–2.0)
Basophils Absolute: 0.3 10*3/uL — ABNORMAL HIGH (ref 0.0–0.1)
Eosinophils Absolute: 0 10*3/uL (ref 0.0–0.5)
HCT: 25 % — ABNORMAL LOW (ref 34.8–46.6)
HGB: 8.1 g/dL — ABNORMAL LOW (ref 11.6–15.9)
LYMPH%: 12.7 % — ABNORMAL LOW (ref 14.0–49.7)
MCHC: 32.4 g/dL (ref 31.5–36.0)
MCV: 83.1 fL (ref 79.5–101.0)
MONO%: 6.7 % (ref 0.0–14.0)
NEUT#: 8 10*3/uL — ABNORMAL HIGH (ref 1.5–6.5)
Platelets: 158 10*3/uL (ref 145–400)

## 2012-12-02 MED ORDER — HEPARIN SOD (PORK) LOCK FLUSH 100 UNIT/ML IV SOLN
500.0000 [IU] | Freq: Once | INTRAVENOUS | Status: AC | PRN
  Administered 2012-12-02: 500 [IU]
  Filled 2012-12-02: qty 5

## 2012-12-02 MED ORDER — SODIUM CHLORIDE 0.9 % IJ SOLN
10.0000 mL | INTRAMUSCULAR | Status: DC | PRN
  Administered 2012-12-02: 10 mL
  Filled 2012-12-02: qty 10

## 2012-12-02 MED ORDER — PALONOSETRON HCL INJECTION 0.25 MG/5ML
INTRAVENOUS | Status: AC
  Filled 2012-12-02: qty 5

## 2012-12-02 MED ORDER — DEXAMETHASONE SODIUM PHOSPHATE 20 MG/5ML IJ SOLN
12.0000 mg | Freq: Once | INTRAMUSCULAR | Status: AC
  Administered 2012-12-02: 12 mg via INTRAVENOUS

## 2012-12-02 MED ORDER — SODIUM CHLORIDE 0.9 % IV SOLN
500.0000 mg/m2 | Freq: Once | INTRAVENOUS | Status: AC
  Administered 2012-12-02: 1040 mg via INTRAVENOUS
  Filled 2012-12-02: qty 52

## 2012-12-02 MED ORDER — PALONOSETRON HCL INJECTION 0.25 MG/5ML
0.2500 mg | Freq: Once | INTRAVENOUS | Status: AC
  Administered 2012-12-02: 0.25 mg via INTRAVENOUS

## 2012-12-02 MED ORDER — LORAZEPAM 0.5 MG PO TABS: 0.5000 mg | ORAL_TABLET | Freq: Four times a day (QID) | ORAL | Status: DC | PRN

## 2012-12-02 MED ORDER — EPIRUBICIN HCL CHEMO IV INJECTION 200 MG/100ML
100.0000 mg/m2 | Freq: Once | INTRAVENOUS | Status: AC
  Administered 2012-12-02: 200 mg via INTRAVENOUS
  Filled 2012-12-02: qty 100

## 2012-12-02 MED ORDER — SODIUM CHLORIDE 0.9 % IV SOLN
150.0000 mg | Freq: Once | INTRAVENOUS | Status: AC
  Administered 2012-12-02: 150 mg via INTRAVENOUS
  Filled 2012-12-02: qty 5

## 2012-12-02 MED ORDER — SODIUM CHLORIDE 0.9 % IV SOLN
Freq: Once | INTRAVENOUS | Status: AC
  Administered 2012-12-02: 10:00:00 via INTRAVENOUS

## 2012-12-02 MED ORDER — FLUOROURACIL CHEMO INJECTION 2.5 GM/50ML
500.0000 mg/m2 | Freq: Once | INTRAVENOUS | Status: AC
  Administered 2012-12-02: 1050 mg via INTRAVENOUS
  Filled 2012-12-02: qty 21

## 2012-12-02 MED ORDER — DEXAMETHASONE SODIUM PHOSPHATE 20 MG/5ML IJ SOLN
INTRAMUSCULAR | Status: AC
  Filled 2012-12-02: qty 5

## 2012-12-02 NOTE — Progress Notes (Signed)
Brisk blood return noted before, during and at end of Ellence administration. 

## 2012-12-02 NOTE — Patient Instructions (Signed)
Please contact us at [phone] if you have any questions or concerns.  Please continue to do well and enjoy life!!!  Get plenty of rest, drink plenty of water, exercise daily (walking), eat a balanced diet.  Consider taking vitamin D3 1000 IUs daily.   Complete monthly self-breast examinations.   Results for orders placed in visit on 12/02/12 (from the past 24 hour(s))  CBC WITH DIFFERENTIAL     Status: Abnormal   Collection Time    12/02/12  8:38 AM      Result Value Range   WBC 10.3  3.9 - 10.3 10e3/uL   NEUT# 8.0 (*) 1.5 - 6.5 10e3/uL   HGB 8.1 (*) 11.6 - 15.9 g/dL   HCT 96.0 (*) 45.4 - 09.8 %   Platelets 158  145 - 400 10e3/uL   MCV 83.1  79.5 - 101.0 fL   MCH 26.9  25.1 - 34.0 pg   MCHC 32.4  31.5 - 36.0 g/dL   RBC 1.19 (*) 1.47 - 8.29 10e6/uL   RDW 24.7 (*) 11.2 - 14.5 %   lymph# 1.3  0.9 - 3.3 10e3/uL   MONO# 0.7  0.1 - 0.9 10e3/uL   Eosinophils Absolute 0.0  0.0 - 0.5 10e3/uL   Basophils Absolute 0.3 (*) 0.0 - 0.1 10e3/uL   NEUT% 77.6 (*) 38.4 - 76.8 %   LYMPH% 12.7 (*) 14.0 - 49.7 %   MONO% 6.7  0.0 - 14.0 %   EOS% 0.2  0.0 - 7.0 %   BASO% 2.8 (*) 0.0 - 2.0 %   nRBC 2 (*) 0 - 0 %   Narrative:    Performed At:  [facility]               [address]               [place], [place] [address]

## 2012-12-02 NOTE — Progress Notes (Addendum)
[facility]  Telephone:([phone] [phone]  OFFICE PROGRESS NOTE   ID: [name]   DOB: 1970/10/18  MR#: [id number]  JYN#:[id number]   PCP: Lucilla Edin, MD   DIAGNOSIS:  Mary Dougherty is a 42 y.o. female diagnosed in 09/2012 with invasive ductal carcinoma of the right breast that is estrogen receptor positive, progesterone receptor positive, HER-2/neu negative.   PRIOR THERAPY: 1.  Patient went for her initial screening mammogram at the insistence of her sister. She was found to have a mass in the right breast. The ultrasound demonstrated a 1.4 cm area of concern. She had an MRI performed that revealed a 2.7 cm irregular area at the 6 o'clock location. There were no masses in the left breast no abnormal lymph nodes. Patient had a biopsy performed of the right breast that revealed an intermediate to high-grade invasive ductal carcinoma the tumor was estrogen receptor positive 99%, progesterone receptor positive 100%,  HER-2/neu negative with a Ki-67 of 70%.   2. Started neoadjuvant chemotherapy with dose dense FEC (5-FU/epirubicin/Cytoxan on 10/07/2012.    3.  Acute on chronic anemia.   CURRENT THERAPY: Neoadjuvant chemotherapy with dose dense FEC cycle #5 of 6 planned cycles and day 2 granulocyte support with Neulasta injection.   INTERVAL HISTORY: Mary Dougherty is a  42 y.o. female who returns today for follow up of  invasive ductal carcinoma of the right breast currently undergoing neoadjuvant chemotherapy.  She is accompanied by her mother Darel Hong for today's office visit.  Since her last office visit on 11/24/2012 she reports loose stools and rectal bleeding.  It was strongly recommended to Mary Dougherty that her acute on chronic anemia may be secondary to chemotherapy in addition to rectal bleeding.  It was explained to her and her mother that she may need a blood transfusion during neoadjuvant chemotherapy regimen, but we highly recommend  that she be evaluated by a gastroenterologists.  She declines a gastroenterologist consult at this time and stated that she would think about receiving a blood transfusion if needed in the future.  She's been taking prenatal vitamins and other supplements to avoid having a blood transfusion.  She states her abdominal pain has resolved.  Mary Dougherty has not had a menstrual cycle since 10/2012.  Her interval history is otherwise unremarkable.   MEDICAL HISTORY: Past Medical History  Diagnosis Date  . Anemia   . Thyroid disease   . Wears glasses   . Breast cancer 2014    ER+/PR+/Her2-    ALLERGIES:   No Known Allergies    MEDICATIONS:  Current Outpatient Prescriptions  Medication Sig Dispense Refill  . dexamethasone (DECADRON) 4 MG tablet Take 2 tablets by mouth once a day on the day after chemotherapy and then take 2 tablets two times a day for 2 days. Take with food.  30 tablet  1  . docusate sodium (COLACE) 100 MG capsule Take 100 mg by mouth 3 (three) times daily as needed.       . folic acid (FOLVITE) 400 MCG tablet Take 400 mcg by mouth daily.      Marland Kitchen lidocaine-prilocaine (EMLA) cream Apply topically as needed.  30 g  6  . LORazepam (ATIVAN) 0.5 MG tablet Take 1 tablet (0.5 mg total) by mouth every 6 (six) hours as needed (Nausea or vomiting).  30 tablet  0  . ondansetron (ZOFRAN ODT) 4 MG disintegrating tablet 4mg  ODT q4 hours prn nausea/vomit  4 tablet  0  .  Prenatal Vit-Fe Fumarate-FA (PRENATAL MULTIVITAMIN) TABS tablet Take 1 tablet by mouth daily at 12 noon.      . prochlorperazine (COMPAZINE) 10 MG tablet Take 1 tablet (10 mg total) by mouth every 6 (six) hours as needed (Nausea or vomiting).  30 tablet  1  . valACYclovir (VALTREX) 500 MG tablet Take 500 mg by mouth daily.       Marland Kitchen oxyCODONE (OXY IR/ROXICODONE) 5 MG immediate release tablet Take 1 tablet (5 mg total) by mouth every 4 (four) hours as needed for pain.  30 tablet  0   No current facility-administered medications  for this visit.    SURGICAL HISTORY:  Past Surgical History  Procedure Laterality Date  . Wisdom tooth extraction    . Portacath placement Left 09/20/2012    Procedure: INSERTION PORT-A-CATH;  Surgeon: Emelia Loron, MD;  Location: [facility];  Service: General;  Laterality: Left;    REVIEW OF SYSTEMS:  A 10 point review of systems was completed and is negative except as stated above.  Mary Dougherty denies any other symptomatology including fatigue, fever or chills, headache, vision changes, swollen glands, cough or shortness of breath, chest pain or discomfort, nausea, vomiting, diarrhea, constipation, change in urinary  habits, arthralgias/myalgias, unusual bruising or any other symptomatology.   PHYSICAL EXAMINATION: Blood pressure 121/70, pulse 91, temperature 98.5 F (36.9 C), temperature source Oral, resp. rate 18, height 5\' 5"  (1.651 m), weight 202 lb (91.627 kg). Body mass index is 33.61 kg/(m^2).  ECOG FS: 1 - Symptomatic but completely ambulatory  General appearance: Alert, cooperative, well nourished, no apparent distress Head: Normocephalic, chemotherapy-induced alopecia, atraumatic, the patient is wearing a head scarf Eyes: Conjunctivae/corneas clear, PERRLA, EOMI Nose: Nares, septum and mucosa are normal, no drainage or sinus tenderness Neck: No adenopathy, supple, symmetrical, trachea midline, no tenderness Resp: Clear to auscultation bilaterally, no wheezes/rales/rhonchi Cardio: Regular rate and rhythm, S1, S2 normal, no murmur, click, rub or gallop, no edema, left chest Port-A-Cath covered and EMLA cream with an occlusive dressing Breasts:  Deferred GI: Soft, distended, non-tender, hypoactive bowel sounds, no organomegaly Skin: No rashes/lesions, skin warm and dry, no erythematous areas, no cyanosis  M/S:  Atraumatic, normal strength in all extremities, normal range of motion, no clubbing  Lymph nodes: Cervical, supraclavicular, and axillary nodes  normal Neurologic: Grossly normal, cranial nerves II through XII intact, alert and oriented x 3 Psych: Appropriate affect   LABORATORY DATA: Lab Results  Component Value Date   WBC 10.3 12/02/2012   HGB 8.1* 12/02/2012   HCT 25.0* 12/02/2012   MCV 83.1 12/02/2012   PLT 158 12/02/2012      Chemistry      Component Value Date/Time   NA 141 12/02/2012 0838   NA 136 10/26/2012 0918   K 3.9 12/02/2012 0838   K 3.3* 10/26/2012 0918   CL 101 10/26/2012 0918   CO2 25 12/02/2012 0838   CO2 27 10/26/2012 0918   BUN 4.1* 12/02/2012 0838   BUN 15 10/26/2012 0918   CREATININE 0.6 12/02/2012 0838   CREATININE 0.60 10/26/2012 0918      Component Value Date/Time   CALCIUM 9.4 12/02/2012 0838   CALCIUM 8.4 10/26/2012 0918   ALKPHOS 92 12/02/2012 0838   ALKPHOS 118* 10/26/2012 0918   AST 18 12/02/2012 0838   AST 12 10/26/2012 0918   ALT 20 12/02/2012 0838   ALT 18 10/26/2012 0918   BILITOT 0.77 12/02/2012 0838   BILITOT 0.6 10/26/2012 1610  RADIOGRAPHIC STUDIES: No results found.   ASSESSMENT:   Miyako Oelke is a 42 y.o. female with:   #1 First ever mammogram found an abnormal mass in the right breast.  Ultrasound confirmed the mass to be 1.4 cm at the 6 o'clock position.   MRI revealed the mass to be a little bit bigger at 2.7 cm. Biopsy showed an invasive ductal carcinoma, grade 2, estrogen receptor/progesterone receptor positive, HER-2/neu negative, with a Ki-67 of 70%.   #2  Patient desires breast conservation and started receiving neoadjuvant chemotherapy consisting of Q2 week dose dense FEC for a total of 6 cycles.  This will be followed by Taxol weekly for a total of 12 weeks. Risks benefits and side effects of this treatment were discussed with the patient.   #3  After definitive surgery, the plan is for the patient to receive radiation therapy adjuvantly under the direction of Dr. Basilio Cairo.  #4  Patient will need antiestrogen therapy after radiation completion because  her tumor is estrogen receptor positive.  Since she has not been without a menses for one year, our plan is to proceed with antiestrogen therapy of Tamoxifen for 10 years.   #5 Acute on chronic anemia and GI bleeding - patient has refused blood transfusion and consult with gastroenterologist.   PLAN:  #1 Mary Dougherty will proceed with chemotherapy consisting of neoadjuvant dose dense FEC today cycle #5 of 6 planned cycles.  She received a Neulasta injection on 12/03/2012.  The patient received a written prescription for Ativan take 0.5 mg by mouth every 6 hours, when necessary for nausea or vomiting #30 with 0 refills.  We once again reviewed that Ativan was to be used as her last choice for nausea/vomiting and to use Compazine or Zofran first.  #2 The need for GI consultation for GI bleeding was strongly recommended to the patient and her mother.  The patient is declining GI consultation at this time.  The patient has declined and states she will most likely decline blood transfusions for anemia if needed in the future.  #3 we plan to see Mary Dougherty again on 12/09/2012 for physical assessment, toxicity evaluation, and laboratory evaluation.  All questions answered.  Mary Dougherty and her mother were encouraged to contact us in the interim with any questions, concerns, or problems.   Larina Bras, NP-C 12/04/2012  6:36 PM   ATTENDING'S ATTESTATION:  I personally reviewed patient's chart, examined patient myself, formulated the treatment plan as followed.     Patient is here for cycle 5 of FEC. Overall she's tolerating it very well. He does take her antiemetics as necessary. She was given a prescription for Ativan today. Patient has been having some GI bleeding. We have recommended a consultation however patient at this time is reluctant to do so. Because of her anemia we have had to give her some blood transfusions. We will see how she does next few weeks.  Patient will be seen back  in one week's time for interim labs.  Drue Second, MD Medical/Oncology [facility] [phone] (beeper) [phone] (Office)  12/08/2012, 7:27 PM

## 2012-12-02 NOTE — Patient Instructions (Signed)
[facility] Discharge Instructions for Patients Receiving Chemotherapy  Today you received the following chemotherapy agents Ellence/Cytoxan/Adrucil  To help prevent nausea and vomiting after your treatment, we encourage you to take your nausea medication as prescribed.   If you develop nausea and vomiting that is not controlled by your nausea medication, call the clinic.   BELOW ARE SYMPTOMS THAT SHOULD BE REPORTED IMMEDIATELY:  *FEVER GREATER THAN 100.5 F  *CHILLS WITH OR WITHOUT FEVER  NAUSEA AND VOMITING THAT IS NOT CONTROLLED WITH YOUR NAUSEA MEDICATION  *UNUSUAL SHORTNESS OF BREATH  *UNUSUAL BRUISING OR BLEEDING  TENDERNESS IN MOUTH AND THROAT WITH OR WITHOUT PRESENCE OF ULCERS  *URINARY PROBLEMS  *BOWEL PROBLEMS  UNUSUAL RASH Items with * indicate a potential emergency and should be followed up as soon as possible.  Feel free to call the clinic you have any questions or concerns. The clinic phone number is [phone].

## 2012-12-03 ENCOUNTER — Ambulatory Visit (HOSPITAL_BASED_OUTPATIENT_CLINIC_OR_DEPARTMENT_OTHER): Payer: BC Managed Care – PPO

## 2012-12-03 VITALS — BP 134/50 | HR 96 | Temp 98.4°F | Resp 20

## 2012-12-03 DIAGNOSIS — C50511 Malignant neoplasm of lower-outer quadrant of right female breast: Secondary | ICD-10-CM

## 2012-12-03 DIAGNOSIS — Z5189 Encounter for other specified aftercare: Secondary | ICD-10-CM

## 2012-12-03 DIAGNOSIS — C50919 Malignant neoplasm of unspecified site of unspecified female breast: Secondary | ICD-10-CM

## 2012-12-03 MED ORDER — PEGFILGRASTIM INJECTION 6 MG/0.6ML
6.0000 mg | Freq: Once | SUBCUTANEOUS | Status: AC
  Administered 2012-12-03: 6 mg via SUBCUTANEOUS

## 2012-12-07 ENCOUNTER — Other Ambulatory Visit: Payer: Self-pay | Admitting: Emergency Medicine

## 2012-12-07 ENCOUNTER — Telehealth: Payer: Self-pay | Admitting: *Deleted

## 2012-12-07 ENCOUNTER — Ambulatory Visit (HOSPITAL_COMMUNITY)
Admission: RE | Admit: 2012-12-07 | Discharge: 2012-12-07 | Disposition: A | Discharge status: Home / Self Care | Payer: BC Managed Care – PPO | Source: Ambulatory Visit | Attending: Oncology | Admitting: Oncology

## 2012-12-07 DIAGNOSIS — C50511 Malignant neoplasm of lower-outer quadrant of right female breast: Secondary | ICD-10-CM

## 2012-12-07 DIAGNOSIS — C50919 Malignant neoplasm of unspecified site of unspecified female breast: Secondary | ICD-10-CM | POA: Insufficient documentation

## 2012-12-07 DIAGNOSIS — D649 Anemia, unspecified: Secondary | ICD-10-CM | POA: Insufficient documentation

## 2012-12-07 NOTE — Telephone Encounter (Signed)
Per staff message and POF I have scheduled appts.  JMW  

## 2012-12-08 ENCOUNTER — Other Ambulatory Visit: Payer: Self-pay | Admitting: Emergency Medicine

## 2012-12-08 ENCOUNTER — Other Ambulatory Visit (HOSPITAL_BASED_OUTPATIENT_CLINIC_OR_DEPARTMENT_OTHER): Payer: BC Managed Care – PPO | Admitting: Lab

## 2012-12-08 ENCOUNTER — Other Ambulatory Visit: Payer: Self-pay

## 2012-12-08 DIAGNOSIS — C50919 Malignant neoplasm of unspecified site of unspecified female breast: Secondary | ICD-10-CM

## 2012-12-08 DIAGNOSIS — C50511 Malignant neoplasm of lower-outer quadrant of right female breast: Secondary | ICD-10-CM

## 2012-12-08 DIAGNOSIS — C50519 Malignant neoplasm of lower-outer quadrant of unspecified female breast: Secondary | ICD-10-CM

## 2012-12-08 LAB — COMPREHENSIVE METABOLIC PANEL (CC13)
AST: 19 U/L (ref 5–34)
Albumin: 3.7 g/dL (ref 3.5–5.0)
Alkaline Phosphatase: 125 U/L (ref 40–150)
BUN: 10.2 mg/dL (ref 7.0–26.0)
Calcium: 9 mg/dL (ref 8.4–10.4)
Chloride: 105 mEq/L (ref 98–109)
Creatinine: 0.6 mg/dL (ref 0.6–1.1)
Glucose: 131 mg/dl (ref 70–140)
Total Bilirubin: 0.66 mg/dL (ref 0.20–1.20)

## 2012-12-08 LAB — CBC WITH DIFFERENTIAL/PLATELET
BASO%: 0.4 % (ref 0.0–2.0)
Basophils Absolute: 0 10*3/uL (ref 0.0–0.1)
EOS%: 0.2 % (ref 0.0–7.0)
HCT: 23.1 % — ABNORMAL LOW (ref 34.8–46.6)
HGB: 7.8 g/dL — ABNORMAL LOW (ref 11.6–15.9)
LYMPH%: 11.5 % — ABNORMAL LOW (ref 14.0–49.7)
MCH: 27.9 pg (ref 25.1–34.0)
MCHC: 34 g/dL (ref 31.5–36.0)
NEUT%: 87.3 % — ABNORMAL HIGH (ref 38.4–76.8)
Platelets: 193 10*3/uL (ref 145–400)
RBC: 2.81 10*6/uL — ABNORMAL LOW (ref 3.70–5.45)
lymph#: 0.7 10*3/uL — ABNORMAL LOW (ref 0.9–3.3)

## 2012-12-08 LAB — ABO/RH: ABO/RH(D): B POS

## 2012-12-09 ENCOUNTER — Ambulatory Visit (HOSPITAL_BASED_OUTPATIENT_CLINIC_OR_DEPARTMENT_OTHER): Payer: BC Managed Care – PPO

## 2012-12-09 ENCOUNTER — Ambulatory Visit (HOSPITAL_BASED_OUTPATIENT_CLINIC_OR_DEPARTMENT_OTHER): Payer: BC Managed Care – PPO | Admitting: Family

## 2012-12-09 ENCOUNTER — Telehealth: Payer: Self-pay | Admitting: Oncology

## 2012-12-09 ENCOUNTER — Encounter: Payer: Self-pay | Admitting: Family

## 2012-12-09 ENCOUNTER — Other Ambulatory Visit: Payer: BC Managed Care – PPO | Admitting: Lab

## 2012-12-09 VITALS — BP 114/77 | HR 70 | Temp 98.1°F | Resp 17

## 2012-12-09 VITALS — BP 116/74 | HR 99 | Temp 98.8°F | Resp 18 | Ht 65.0 in | Wt 197.4 lb

## 2012-12-09 DIAGNOSIS — K922 Gastrointestinal hemorrhage, unspecified: Secondary | ICD-10-CM

## 2012-12-09 DIAGNOSIS — C50919 Malignant neoplasm of unspecified site of unspecified female breast: Secondary | ICD-10-CM

## 2012-12-09 DIAGNOSIS — D638 Anemia in other chronic diseases classified elsewhere: Secondary | ICD-10-CM

## 2012-12-09 DIAGNOSIS — E876 Hypokalemia: Secondary | ICD-10-CM

## 2012-12-09 MED ORDER — ACETAMINOPHEN 325 MG PO TABS
ORAL_TABLET | ORAL | Status: AC
  Filled 2012-12-09: qty 2

## 2012-12-09 MED ORDER — SODIUM CHLORIDE 0.9 % IV SOLN: 250.0000 mL | Freq: Once | INTRAVENOUS | Status: DC

## 2012-12-09 MED ORDER — DIPHENHYDRAMINE HCL 25 MG PO CAPS
ORAL_CAPSULE | ORAL | Status: AC
  Filled 2012-12-09: qty 1

## 2012-12-09 MED ORDER — SODIUM CHLORIDE 0.9 % IJ SOLN
10.0000 mL | INTRAMUSCULAR | Status: AC | PRN
  Administered 2012-12-09: 10 mL
  Filled 2012-12-09: qty 10

## 2012-12-09 MED ORDER — ACETAMINOPHEN 325 MG PO TABS
650.0000 mg | ORAL_TABLET | Freq: Once | ORAL | Status: AC
  Administered 2012-12-09: 650 mg via ORAL

## 2012-12-09 MED ORDER — DIPHENHYDRAMINE HCL 25 MG PO CAPS
25.0000 mg | ORAL_CAPSULE | Freq: Once | ORAL | Status: AC
  Administered 2012-12-09: 25 mg via ORAL

## 2012-12-09 MED ORDER — HEPARIN SOD (PORK) LOCK FLUSH 100 UNIT/ML IV SOLN
500.0000 [IU] | Freq: Every day | INTRAVENOUS | Status: AC | PRN
  Administered 2012-12-09: 500 [IU]
  Filled 2012-12-09: qty 5

## 2012-12-09 NOTE — Progress Notes (Signed)
[facility]  Telephone:([phone] [phone]) [phone]  OFFICE PROGRESS NOTE    ID: Mary Dougherty   DOB: 1970/07/19  MR#: [id number]  JYN#:[id number]   PCP: Lucilla Edin, MD   DIAGNOSIS:  Mary Dougherty is a 42 y.o. female diagnosed in 09/2012 with invasive ductal carcinoma of the right breast that is estrogen receptor positive, progesterone receptor positive, HER-2/neu negative.   PRIOR THERAPY: 1.  Patient went for her initial screening mammogram at the insistence of her sister. She was found to have a mass in the right breast. The ultrasound demonstrated a 1.4 cm area of concern. She had an MRI performed that revealed a 2.7 cm irregular area at the 6 o'clock location. There were no masses in the left breast no abnormal lymph nodes. Patient had a biopsy performed of the right breast that revealed an intermediate to high-grade invasive ductal carcinoma the tumor was estrogen receptor positive 99%, progesterone receptor positive 100%,  HER-2/neu negative with a Ki-67 of 70%.   2. Started neoadjuvant chemotherapy with dose dense FEC (5-FU/epirubicin/Cytoxan) on 10/07/2012.    3.  Acute on chronic anemia.   CURRENT THERAPY: Neoadjuvant chemotherapy with dose dense FEC with 6 planned cycles planned and day 2 granulocyte support with Neulasta injection.   INTERVAL HISTORY: Mary Dougherty is a  42 y.o. female who returns today for follow up of  invasive ductal carcinoma of the right breast currently undergoing neoadjuvant chemotherapy.  She is accompanied by her mother Mary Dougherty for today's office visit.  Since her last office visit on 12/09/2012 she decided to receive a blood transfusion of 1 unit of packed red blood cells and have a GI consultation secondary to rectal bleeding.  It will be recalled during her last office visit, it was strongly recommended to Mary Dougherty that she may need a blood transfusion during neoadjuvant chemotherapy regimen, but we highly  recommend that she be evaluated by a gastroenterologists.  She declined both recommendations at that time, but has since agreed to both a gastroenterologist consult and receiving a blood transfusion.  Mary Dougherty has not had a menstrual cycle since 10/2012.  She has complaints of right ear pain and fatigue today.   Her interval history is otherwise unremarkable.   MEDICAL HISTORY: Past Medical History  Diagnosis Date  . Anemia   . Thyroid disease   . Wears glasses   . Breast cancer 2014    ER+/PR+/Her2-    ALLERGIES:   No Known Allergies    MEDICATIONS:  Current Outpatient Prescriptions  Medication Sig Dispense Refill  . Cholecalciferol (VITAMIN D3) 1000 UNITS CAPS Take 1 capsule by mouth daily.      Marland Kitchen dexamethasone (DECADRON) 4 MG tablet Take 2 tablets by mouth once a day on the day after chemotherapy and then take 2 tablets two times a day for 2 days. Take with food.  30 tablet  1  . docusate sodium (COLACE) 100 MG capsule Take 100 mg by mouth 3 (three) times daily as needed.       . folic acid (FOLVITE) 400 MCG tablet Take 400 mcg by mouth daily.      Marland Kitchen lidocaine-prilocaine (EMLA) cream Apply topically as needed.  30 g  6  . LORazepam (ATIVAN) 0.5 MG tablet Take 1 tablet (0.5 mg total) by mouth every 6 (six) hours as needed (Nausea or vomiting).  30 tablet  0  . ondansetron (ZOFRAN ODT) 4 MG disintegrating tablet 4mg  ODT q4 hours prn  nausea/vomit  4 tablet  0  . Prenatal Vit-Fe Fumarate-FA (PRENATAL MULTIVITAMIN) TABS tablet Take 1 tablet by mouth daily at 12 noon.      . valACYclovir (VALTREX) 500 MG tablet Take 500 mg by mouth daily.       Marland Kitchen oxyCODONE (OXY IR/ROXICODONE) 5 MG immediate release tablet Take 1 tablet (5 mg total) by mouth every 4 (four) hours as needed for pain.  30 tablet  0  . prochlorperazine (COMPAZINE) 10 MG tablet Take 1 tablet (10 mg total) by mouth every 6 (six) hours as needed (Nausea or vomiting).  30 tablet  1   No current facility-administered  medications for this visit.    SURGICAL HISTORY:  Past Surgical History  Procedure Laterality Date  . Wisdom tooth extraction    . Portacath placement Left 09/20/2012    Procedure: INSERTION PORT-A-CATH;  Surgeon: Emelia Loron, MD;  Location: [facility];  Service: General;  Laterality: Left;    REVIEW OF SYSTEMS:  A 10 point review of systems was completed and is negative except as stated above.  Mary Dougherty denies any other symptomatology including fatigue, fever or chills, headache, vision changes, swollen glands, cough or shortness of breath, chest pain or discomfort, nausea, vomiting, diarrhea, constipation, change in urinary  habits, arthralgias/myalgias, unusual bruising or any other symptomatology.   PHYSICAL EXAMINATION: Blood pressure 116/74, pulse 99, temperature 98.8 F (37.1 C), temperature source Oral, resp. rate 18, height 5\' 5"  (1.651 m), weight 197 lb 6.4 oz (89.54 kg). Body mass index is 32.85 kg/(m^2).  ECOG FS: 1 - Symptomatic but completely ambulatory  General appearance: Alert, cooperative, well nourished, no apparent distress Head: Normocephalic, chemotherapy-induced alopecia, atraumatic, the patient is wearing a head scarf Eyes: Conjunctivae/corneas clear, PERRLA, EOMI Ears: Tympanic membranes shiny/pearly and intact bilaterally, no exudate, visible infection or injection bilaterally Nose: Nares, septum and mucosa are normal, no drainage or sinus tenderness Neck: No adenopathy, supple, symmetrical, trachea midline, no tenderness Resp: Clear to auscultation bilaterally, no wheezes/rales/rhonchi Cardio: Regular rate and rhythm, S1, S2 normal, no murmur, click, rub or gallop, no edema, left chest Port-A-Cath covered and EMLA cream with an occlusive dressing Breasts:  Deferred GI: Soft, distended, non-tender, hypoactive bowel sounds, no organomegaly Skin: No rashes/lesions, skin warm and dry, no erythematous areas, no cyanosis  M/S:  Atraumatic,  normal strength in all extremities, normal range of motion, no clubbing  Lymph nodes: Cervical, supraclavicular, and axillary nodes normal Neurologic: Grossly normal, cranial nerves II through XII intact, alert and oriented x 3 Psych: Appropriate affect   LABORATORY DATA: Lab Results  Component Value Date   WBC 5.8 12/08/2012   HGB 7.8* 12/08/2012   HCT 23.1* 12/08/2012   MCV 82.0 12/08/2012   PLT 193 12/08/2012      Chemistry      Component Value Date/Time   NA 139 12/08/2012 0939   NA 136 10/26/2012 0918   K 3.4* 12/08/2012 0939   K 3.3* 10/26/2012 0918   CL 101 10/26/2012 0918   CO2 23 12/08/2012 0939   CO2 27 10/26/2012 0918   BUN 10.2 12/08/2012 0939   BUN 15 10/26/2012 0918   CREATININE 0.6 12/08/2012 0939   CREATININE 0.60 10/26/2012 0918      Component Value Date/Time   CALCIUM 9.0 12/08/2012 0939   CALCIUM 8.4 10/26/2012 0918   ALKPHOS 125 12/08/2012 0939   ALKPHOS 118* 10/26/2012 0918   AST 19 12/08/2012 0939   AST 12 10/26/2012 0918   ALT  13 12/08/2012 0939   ALT 18 10/26/2012 0918   BILITOT 0.66 12/08/2012 0939   BILITOT 0.6 10/26/2012 0918       RADIOGRAPHIC STUDIES: No results found.   ASSESSMENT:   Danaye Sobh is a 42 y.o. female with:   #1 First ever mammogram found an abnormal mass in the right breast.  Ultrasound confirmed the mass to be 1.4 cm at the 6 o'clock position.   MRI revealed the mass to be a little bit bigger at 2.7 cm. Biopsy showed an invasive ductal carcinoma, grade 2, estrogen receptor/progesterone receptor positive, HER-2/neu negative, with a Ki-67 of 70%.   #2  Patient desires breast conservation and started receiving neoadjuvant chemotherapy consisting of Q2 week dose dense FEC for a total of 6 cycles.  This will be followed by Taxol weekly for a total of 12 weeks. Risks benefits and side effects of this treatment were discussed with the patient.   #3  After definitive surgery, the plan is for the patient to receive radiation therapy  adjuvantly under the direction of Mary Dougherty.  #4  Patient will need antiestrogen therapy after radiation completion because her tumor is estrogen receptor positive.  Since she has not been without a menses for one year, our plan is to proceed with antiestrogen therapy of Tamoxifen for 10 years.   #5 Acute on chronic anemia and GI bleeding - patient agreed blood transfusion and consult with gastroenterologist.  #6 Hypokalemia   PLAN:  #1 Mary Dougherty will proceed with blood transfusion of one unit of packed red blood cells today.  GI consultation with Mary Dougherty submitted for rectal bleeding.  #2 Mary Dougherty's potassium is slightly hypokalemic at 3.4.  She was asked to consume more potassium through her diet and was provided with a list of foods high in potassium.  #3  We plan to see Mary Dougherty again on 12/16/2012 for cycle #6 of 6 neoadjuvant chemotherapy cycle consisting of dose dense FEC.  She is scheduled to receive a Neulasta injection on 12/17/2012.  Her first neoadjuvant weekly Taxol infusion is scheduled for 12/30/2012.  All questions answered. Mary Dougherty and her mother were encouraged to contact us in the interim with any questions, concerns, or problems.   Larina Bras, NP-C 12/11/2012    6:06 PM

## 2012-12-09 NOTE — Progress Notes (Signed)
1440-when checking pt 15 minutes VS - pt c/o pain in chest.  Infusion stopped.  VS - see Epic.  Assessed - no itching, headache, pain in neck or arm, pain in back, nausea, shortness of breath, tightness in chest.  Pt reports had experienced similar pain before when walking - a single sharp pain in the center of her chest near sternum.  Pt reports no further pain after initial.   1443 - infusion restarted.  Pt observed closely.   1503 - VS - see Epic.

## 2012-12-09 NOTE — Telephone Encounter (Signed)
, °

## 2012-12-09 NOTE — Patient Instructions (Signed)
Please contact us at [phone] if you have any questions or concerns.  Please continue to do well and enjoy life!!!  Get plenty of rest, drink plenty of water, exercise daily (walking), eat a balanced diet.  Take vitamin D3 1000 IUs daily.    Have your mammogram completed every year.Potassium Rich Foods List - Foods High in Potassium List of Foods High in Potassium: Foods with Potassium  Serving Size Potassium (mg)  Apricots, dried 10 halves 407  Avocados, raw 1 ounce 180  Bananas, raw 1 cup 594  Beets, cooked 1 cup 519  Brussel sprouts, cooked 1 cup 504  Cantaloupe 1 cup 494  Dates, dry 5 dates 271  Figs, dry 2 figs 271  Kiwi fruit, raw 1 medium 252  Lima beans 1 cup 955  Melons, honeydew 1 cup 461  Milk, fat free or skim 1 cup 407  Nectarines 1 nectarine 288  Orange juice 1 cup 496  Oranges 1 orange 237  Pears (fresh) 1 pear 208  Peanuts dry roasted, unsalted 1 ounce 187  Potatoes, baked, 1 potato 1081  Prune juice 1 cup 707  Prunes, dried 1 cup 828  Raisins 1 cup 1089  Spinach, cooked 1 cup 839  Tomato products, canned sauce 1 cup 909  Winter squash 1 cup 896  Yogurt plain, skim milk 8 ounces 579  USDA Nutrient Database for Standard References, Release 15 for Potassium, K (mg)

## 2012-12-09 NOTE — Patient Instructions (Signed)
Blood Transfusion Information WHAT IS A BLOOD TRANSFUSION? A transfusion is the replacement of blood or some of its parts. Blood is made up of multiple cells which provide different functions.  Red blood cells carry oxygen and are used for blood loss replacement.  White blood cells fight against infection.  Platelets control bleeding.  Plasma helps clot blood.  Other blood products are available for specialized needs, such as hemophilia or other clotting disorders. BEFORE THE TRANSFUSION  Who gives blood for transfusions?   You may be able to donate blood to be used at a later date on yourself (autologous donation).  Relatives can be asked to donate blood. This is generally not any safer than if you have received blood from a stranger. The same precautions are taken to ensure safety when a relative's blood is donated.  Healthy volunteers who are fully evaluated to make sure their blood is safe. This is blood bank blood. Transfusion therapy is the safest it has ever been in the practice of medicine. Before blood is taken from a donor, a complete history is taken to make sure that person has no history of diseases nor engages in risky social behavior (examples are intravenous drug use or sexual activity with multiple partners). The donor's travel history is screened to minimize risk of transmitting infections, such as malaria. The donated blood is tested for signs of infectious diseases, such as HIV and hepatitis. The blood is then tested to be sure it is compatible with you in order to minimize the chance of a transfusion reaction. If you or a relative donates blood, this is often done in anticipation of surgery and is not appropriate for emergency situations. It takes many days to process the donated blood. RISKS AND COMPLICATIONS Although transfusion therapy is very safe and saves many lives, the main dangers of transfusion include:   Getting an infectious disease.  Developing a  transfusion reaction. This is an allergic reaction to something in the blood you were given. Every precaution is taken to prevent this. The decision to have a blood transfusion has been considered carefully by your caregiver before blood is given. Blood is not given unless the benefits outweigh the risks. AFTER THE TRANSFUSION  Right after receiving a blood transfusion, you will usually feel much better and more energetic. This is especially true if your red blood cells have gotten low (anemic). The transfusion raises the level of the red blood cells which carry oxygen, and this usually causes an energy increase.  The nurse administering the transfusion will monitor you carefully for complications. HOME CARE INSTRUCTIONS  No special instructions are needed after a transfusion. You may find your energy is better. Speak with your caregiver about any limitations on activity for underlying diseases you may have. SEEK MEDICAL CARE IF:   Your condition is not improving after your transfusion.  You develop redness or irritation at the intravenous (IV) site. SEEK IMMEDIATE MEDICAL CARE IF:  Any of the following symptoms occur over the next 12 hours:  Shaking chills.  You have a temperature by mouth above 102 F (38.9 C), not controlled by medicine.  Chest, back, or muscle pain.  People around you feel you are not acting correctly or are confused.  Shortness of breath or difficulty breathing.  Dizziness and fainting.  You get a rash or develop hives.  You have a decrease in urine output.  Your urine turns a dark color or changes to pink, red, or brown. Any of the following   symptoms occur over the next 10 days:  You have a temperature by mouth above 102 F (38.9 C), not controlled by medicine.  Shortness of breath.  Weakness after normal activity.  The white part of the eye turns yellow (jaundice).  You have a decrease in the amount of urine or are urinating less often.  Your  urine turns a dark color or changes to pink, red, or brown. Document Released: 01/17/2000 Document Revised: 04/13/2011 Document Reviewed: 09/05/2007 Exit[employer] Patient Information 2014 [employer].  

## 2012-12-11 ENCOUNTER — Encounter: Payer: Self-pay | Admitting: Family

## 2012-12-11 LAB — TYPE AND SCREEN: Antibody Screen: NEGATIVE

## 2012-12-12 ENCOUNTER — Encounter: Payer: Self-pay | Admitting: Gastroenterology

## 2012-12-12 ENCOUNTER — Ambulatory Visit (INDEPENDENT_AMBULATORY_CARE_PROVIDER_SITE_OTHER): Payer: BC Managed Care – PPO | Admitting: Gastroenterology

## 2012-12-12 VITALS — BP 120/58 | HR 78 | Ht 65.0 in | Wt 198.0 lb

## 2012-12-12 DIAGNOSIS — K921 Melena: Secondary | ICD-10-CM

## 2012-12-12 DIAGNOSIS — K5900 Constipation, unspecified: Secondary | ICD-10-CM

## 2012-12-12 DIAGNOSIS — D649 Anemia, unspecified: Secondary | ICD-10-CM

## 2012-12-12 DIAGNOSIS — R195 Other fecal abnormalities: Secondary | ICD-10-CM

## 2012-12-12 NOTE — Progress Notes (Signed)
    History of Present Illness: This is a 42 year old female with right breast cancer currently undergoing chemotherapy. Plans are for a mastectomy with lymph node dissection after completing chemotherapy and then radiation therapy. Over the past 2 months she has had worsening problems with constipation and hard stools. She noted rectal pain and small amounts of bright red blood with defecation on multiple occasions over the past 2 months. Hemoccult positive stool was noted. She has a history of spherocytosis and anemia her dropped to 7.8 and she received a transfusion recently. Her rectal bleeding and rectal pain both have improved with stool softeners and Preparation H suppositories. Denies weight loss, abdominal pain, diarrhea, change in stool caliber, melena, nausea, vomiting, dysphagia, reflux symptoms, chest pain.  Review of Systems: Pertinent positive and negative review of systems were noted in the above HPI section. All other review of systems were otherwise negative.  Current Medications, Allergies, Past Medical History, Past Surgical History, Family History and Social History were reviewed in [employer] record.  Physical Exam: General: Well developed , well nourished, no acute distress Head: Normocephalic and atraumatic Eyes:  sclerae anicteric, EOMI Ears: Normal auditory acuity Mouth: No deformity or lesions Neck: Supple, no masses or thyromegaly Lungs: Clear throughout to auscultation Heart: Regular rate and rhythm; no murmurs, rubs or bruits Abdomen: Soft, non tender and non distended. No masses, hepatosplenomegaly or hernias noted. Normal Bowel sounds Rectal: Mild anal canal tenderness, no lesions appreciated, brown 1+ heme positive stool the vault Musculoskeletal: Symmetrical with no gross deformities  Skin: No lesions on visible extremities Pulses:  Normal pulses noted Extremities: No clubbing, cyanosis, edema or deformities noted Neurological: Alert  oriented x 4, grossly nonfocal Cervical Nodes:  No significant cervical adenopathy Inguinal Nodes: No significant inguinal adenopathy Psychological:  Alert and cooperative. Normal mood and affect  Assessment and Recommendations:  1. Hematochezia, Hemoccult positive stool, constipation. Suspected hemorrhoidal bleeding. Rule out colorectal neoplasms. Increase dietary fiber and water intake. Begin MiraLax once or twice daily as needed. Continue Colace twice a day and Preparation H suppositories. The risks, benefits, and alternatives to colonoscopy with possible biopsy and possible polypectomy were discussed with the patient and they consent to proceed.   2. Right breast cancer undergoing chemotherapy.  3. Spherocytosis and anemia. The anemia is out of proportion to the amount of gastrointestinal bleeding described. Chemotherapy and spherocytosis likely contributing to her anemia. Further followup with hematology oncology.

## 2012-12-12 NOTE — Patient Instructions (Addendum)
It has been recommended to you by your physician that you have a(n) Colonoscopy completed. Per your request, we did not schedule the procedure(s) today. Please contact our office at [phone] should you decide to have the procedure completed.  Increase your Miralax to titrate depending on bowel movements.   Thank you for choosing me and [facility].  Venita Lick. Pleas Koch., MD., Clementeen Graham  cc: Aggie Moats, NP

## 2012-12-16 ENCOUNTER — Encounter: Payer: Self-pay | Admitting: Family

## 2012-12-16 ENCOUNTER — Ambulatory Visit (HOSPITAL_BASED_OUTPATIENT_CLINIC_OR_DEPARTMENT_OTHER): Payer: BC Managed Care – PPO | Admitting: Family

## 2012-12-16 ENCOUNTER — Ambulatory Visit (HOSPITAL_BASED_OUTPATIENT_CLINIC_OR_DEPARTMENT_OTHER): Payer: BC Managed Care – PPO

## 2012-12-16 ENCOUNTER — Other Ambulatory Visit (HOSPITAL_BASED_OUTPATIENT_CLINIC_OR_DEPARTMENT_OTHER): Payer: BC Managed Care – PPO | Admitting: Lab

## 2012-12-16 VITALS — BP 120/72 | HR 76 | Temp 98.4°F | Resp 18 | Ht 65.0 in | Wt 197.3 lb

## 2012-12-16 DIAGNOSIS — C50511 Malignant neoplasm of lower-outer quadrant of right female breast: Secondary | ICD-10-CM

## 2012-12-16 DIAGNOSIS — D649 Anemia, unspecified: Secondary | ICD-10-CM

## 2012-12-16 DIAGNOSIS — Z170 Estrogen receptor positive status [ER+]: Secondary | ICD-10-CM

## 2012-12-16 DIAGNOSIS — C50919 Malignant neoplasm of unspecified site of unspecified female breast: Secondary | ICD-10-CM

## 2012-12-16 DIAGNOSIS — K922 Gastrointestinal hemorrhage, unspecified: Secondary | ICD-10-CM

## 2012-12-16 DIAGNOSIS — Z5111 Encounter for antineoplastic chemotherapy: Secondary | ICD-10-CM

## 2012-12-16 LAB — COMPREHENSIVE METABOLIC PANEL (CC13)
ALT: 22 U/L (ref 0–55)
Alkaline Phosphatase: 91 U/L (ref 40–150)
Anion Gap: 11 mEq/L (ref 3–11)
Creatinine: 0.7 mg/dL (ref 0.6–1.1)
Glucose: 133 mg/dl (ref 70–140)
Sodium: 142 mEq/L (ref 136–145)
Total Bilirubin: 0.99 mg/dL (ref 0.20–1.20)
Total Protein: 6.9 g/dL (ref 6.4–8.3)

## 2012-12-16 LAB — CBC WITH DIFFERENTIAL/PLATELET
BASO%: 2.9 % — ABNORMAL HIGH (ref 0.0–2.0)
Eosinophils Absolute: 0 10*3/uL (ref 0.0–0.5)
HGB: 10.3 g/dL — ABNORMAL LOW (ref 11.6–15.9)
MONO#: 0.6 10*3/uL (ref 0.1–0.9)
MONO%: 7.9 % (ref 0.0–14.0)
NEUT#: 5.1 10*3/uL (ref 1.5–6.5)
RBC: 3.76 10*6/uL (ref 3.70–5.45)
RDW: 19.9 % — ABNORMAL HIGH (ref 11.2–14.5)
WBC: 7 10*3/uL (ref 3.9–10.3)

## 2012-12-16 MED ORDER — EPIRUBICIN HCL CHEMO IV INJECTION 200 MG/100ML
100.0000 mg/m2 | Freq: Once | INTRAVENOUS | Status: AC
  Administered 2012-12-16: 200 mg via INTRAVENOUS
  Filled 2012-12-16: qty 100

## 2012-12-16 MED ORDER — PALONOSETRON HCL INJECTION 0.25 MG/5ML
INTRAVENOUS | Status: AC
  Filled 2012-12-16: qty 5

## 2012-12-16 MED ORDER — DEXAMETHASONE SODIUM PHOSPHATE 20 MG/5ML IJ SOLN
12.0000 mg | Freq: Once | INTRAMUSCULAR | Status: AC
  Administered 2012-12-16: 12 mg via INTRAVENOUS

## 2012-12-16 MED ORDER — CYCLOPHOSPHAMIDE CHEMO INJECTION 1 GM
500.0000 mg/m2 | Freq: Once | INTRAMUSCULAR | Status: AC
  Administered 2012-12-16: 1040 mg via INTRAVENOUS
  Filled 2012-12-16: qty 52

## 2012-12-16 MED ORDER — SODIUM CHLORIDE 0.9 % IV SOLN
Freq: Once | INTRAVENOUS | Status: AC
  Administered 2012-12-16: 11:00:00 via INTRAVENOUS

## 2012-12-16 MED ORDER — HEPARIN SOD (PORK) LOCK FLUSH 100 UNIT/ML IV SOLN
500.0000 [IU] | Freq: Once | INTRAVENOUS | Status: DC | PRN
  Filled 2012-12-16: qty 5

## 2012-12-16 MED ORDER — PALONOSETRON HCL INJECTION 0.25 MG/5ML
0.2500 mg | Freq: Once | INTRAVENOUS | Status: AC
  Administered 2012-12-16: 0.25 mg via INTRAVENOUS

## 2012-12-16 MED ORDER — SODIUM CHLORIDE 0.9 % IJ SOLN
10.0000 mL | INTRAMUSCULAR | Status: DC | PRN
  Filled 2012-12-16: qty 10

## 2012-12-16 MED ORDER — FLUOROURACIL CHEMO INJECTION 2.5 GM/50ML
500.0000 mg/m2 | Freq: Once | INTRAVENOUS | Status: AC
  Administered 2012-12-16: 1050 mg via INTRAVENOUS
  Filled 2012-12-16: qty 21

## 2012-12-16 MED ORDER — FOSAPREPITANT DIMEGLUMINE INJECTION 150 MG
150.0000 mg | Freq: Once | INTRAVENOUS | Status: AC
  Administered 2012-12-16: 150 mg via INTRAVENOUS
  Filled 2012-12-16: qty 5

## 2012-12-16 MED ORDER — DEXAMETHASONE SODIUM PHOSPHATE 20 MG/5ML IJ SOLN
INTRAMUSCULAR | Status: AC
  Filled 2012-12-16: qty 5

## 2012-12-16 NOTE — Patient Instructions (Signed)
Please contact us at [phone] if you have any questions or concerns.  Please continue to do well and enjoy life!!!  Get plenty of rest, drink plenty of water, exercise daily (walking), eat a balanced diet.  Take vitamin D3 1000 IUs daily.    Results for orders placed in visit on 12/16/12 (from the past 24 hour(s))  CBC WITH DIFFERENTIAL     Status: Abnormal   Collection Time    12/16/12  8:28 AM      Result Value Range   WBC 7.0  3.9 - 10.3 10e3/uL   NEUT# 5.1  1.5 - 6.5 10e3/uL   HGB 10.3 (*) 11.6 - 15.9 g/dL   HCT 91.4 (*) 78.2 - 95.6 %   Platelets 135 (*) 145 - 400 10e3/uL   MCV 83.0  79.5 - 101.0 fL   MCH 27.4  25.1 - 34.0 pg   MCHC 33.0  31.5 - 36.0 g/dL   RBC 2.13  0.86 - 5.78 10e6/uL   RDW 19.9 (*) 11.2 - 14.5 %   lymph# 1.1  0.9 - 3.3 10e3/uL   MONO# 0.6  0.1 - 0.9 10e3/uL   Eosinophils Absolute 0.0  0.0 - 0.5 10e3/uL   Basophils Absolute 0.2 (*) 0.0 - 0.1 10e3/uL   NEUT% 73.6  38.4 - 76.8 %   LYMPH% 15.2  14.0 - 49.7 %   MONO% 7.9  0.0 - 14.0 %   EOS% 0.4  0.0 - 7.0 %   BASO% 2.9 (*) 0.0 - 2.0 %   Narrative:    Performed At:  [facility]               [address]               [place], [place] [address]  COMPREHENSIVE METABOLIC PANEL (CC13)     Status: None   Collection Time    12/16/12  8:28 AM      Result Value Range   Sodium 142  136 - 145 mEq/L   Potassium 4.0  3.5 - 5.1 mEq/L   Chloride 105  98 - 109 mEq/L   CO2 25  22 - 29 mEq/L   Glucose 133  70 - 140 mg/dl   BUN 7.1  7.0 - 95.2 mg/dL   Creatinine 0.7  0.6 - 1.1 mg/dL   Total Bilirubin 8.41  0.20 - 1.20 mg/dL   Alkaline Phosphatase 91  40 - 150 U/L   AST 19  5 - 34 U/L   ALT 22  0 - 55 U/L   Total Protein 6.9  6.4 - 8.3 g/dL   Albumin 4.1  3.5 - 5.0 g/dL   Calcium 9.6  8.4 - 32.4 mg/dL   Anion Gap 11  3 - 11 mEq/L   Narrative:    Performed At:  [facility]               [address]               [place], [place] [address]

## 2012-12-16 NOTE — Progress Notes (Signed)
[facility]  Telephone:(336) [phone] Fax:(336) [phone]  OFFICE PROGRESS NOTE    ID: Mary Dougherty   DOB: 1970-12-16  MR#: [id number]  ONG#:[id number]   PCP: Lucilla Edin, MD GI: Claudette Head, M.D.   DIAGNOSIS:  Mary Dougherty is a 42 y.o. female diagnosed in 09/2012 with invasive ductal carcinoma of the right breast that is estrogen receptor positive, progesterone receptor positive, HER-2/neu negative.   PRIOR THERAPY: 1.  Patient went for her initial screening mammogram at the insistence of her sister. She was found to have a mass in the right breast. The ultrasound demonstrated a 1.4 cm area of concern. She had an MRI performed that revealed a 2.7 cm irregular area at the 6 o'clock location. There were no masses in the left breast no abnormal lymph nodes. Patient had a biopsy performed of the right breast that revealed an intermediate to high-grade invasive ductal carcinoma the tumor was estrogen receptor positive 99%, progesterone receptor positive 100%,  HER-2/neu negative with a Ki-67 of 70%.   2. Started neoadjuvant chemotherapy with dose dense FEC (5-FU/epirubicin/Cytoxan) on 10/07/2012.    3.  Acute on chronic anemia.   CURRENT THERAPY: Neoadjuvant chemotherapy with dose dense FEC with 6 planned cycles planned and day 2 granulocyte support with Neulasta injection.   INTERVAL HISTORY: Mary Dougherty is a  42 y.o. female who returns today for follow up of  invasive ductal carcinoma of the right breast currently undergoing neoadjuvant chemotherapy.  She is accompanied by her mother Darel Hong for today's office visit.  Since her last office visit on 12/09/2012 she decided to receive a blood transfusion of 2 units of packed red blood cells and have a GI consultation secondary to rectal bleeding.  It will be recalled during her last office visit, it was strongly recommended to Mary Dougherty that she may need a blood transfusion during neoadjuvant  chemotherapy regimen, but we highly recommend that she be evaluated by a gastroenterologist.  She declined both recommendations at that time, but has since completed both a gastroenterologist consult and received blood transfusions. She states she feels better since receiving blood transfusions.   Mary Dougherty has not had a menstrual cycle since 10/2012.  She has complaints of work stressors today.   Her interval history is otherwise unremarkable.   MEDICAL HISTORY: Past Medical History  Diagnosis Date  . Anemia   . Thyroid disease   . Wears glasses   . Breast cancer 2014    ER+/PR+/Her2-  . Spherocytosis 1989    ALLERGIES:   No Known Allergies   MEDICATIONS:  Current Outpatient Prescriptions  Medication Sig Dispense Refill  . Cholecalciferol (VITAMIN D3) 1000 UNITS CAPS Take 1 capsule by mouth daily.      Marland Kitchen dexamethasone (DECADRON) 4 MG tablet Take 2 tablets by mouth once a day on the day after chemotherapy and then take 2 tablets two times a day for 2 days. Take with food.  30 tablet  1  . docusate sodium (COLACE) 100 MG capsule Take 100 mg by mouth 3 (three) times daily as needed.       . folic acid (FOLVITE) 400 MCG tablet Take 400 mcg by mouth 2 (two) times daily.       Marland Kitchen lidocaine-prilocaine (EMLA) cream Apply topically as needed.  30 g  6  . LORazepam (ATIVAN) 0.5 MG tablet Take 1 tablet (0.5 mg total) by mouth every 6 (six) hours as needed (Nausea or vomiting).  30 tablet  0  . ondansetron (ZOFRAN ODT) 4 MG disintegrating tablet 4mg  ODT q4 hours prn nausea/vomit  4 tablet  0  . oxyCODONE (OXY IR/ROXICODONE) 5 MG immediate release tablet Take 1 tablet (5 mg total) by mouth every 4 (four) hours as needed for pain.  30 tablet  0  . Prenatal Vit-Fe Fumarate-FA (PRENATAL MULTIVITAMIN) TABS tablet Take 1 tablet by mouth daily at 12 noon.      . prochlorperazine (COMPAZINE) 10 MG tablet Take 1 tablet (10 mg total) by mouth every 6 (six) hours as needed (Nausea or vomiting).  30 tablet   1  . valACYclovir (VALTREX) 500 MG tablet Take 500 mg by mouth daily.        No current facility-administered medications for this visit.    SURGICAL HISTORY:  Past Surgical History  Procedure Laterality Date  . Wisdom tooth extraction    . Portacath placement Left 09/20/2012    Procedure: INSERTION PORT-A-CATH;  Surgeon: Emelia Loron, MD;  Location: [facility];  Service: General;  Laterality: Left;    REVIEW OF SYSTEMS:  A 10 point review of systems was completed and is negative except as stated above.  Mary Dougherty denies any other symptomatology including fatigue, fever or chills, headache, vision changes, swollen glands, cough or shortness of breath, chest pain or discomfort, nausea, vomiting, diarrhea, constipation, change in urinary  habits, arthralgias/myalgias, unusual bruising or any other symptomatology.   PHYSICAL EXAMINATION: Blood pressure 120/72, pulse 76, temperature 98.4 F (36.9 C), temperature source Oral, resp. rate 18, height 5\' 5"  (1.651 m), weight 197 lb 4.8 oz (89.495 kg). Body mass index is 32.83 kg/(m^2).  ECOG FS: 1 - Symptomatic but completely ambulatory  General appearance: Alert, cooperative, well nourished, no apparent distress Head: Normocephalic, chemotherapy-induced alopecia, atraumatic, the patient is wearing a hat Eyes: Conjunctivae/corneas clear, PERRLA, EOMI Ears: Tympanic membranes shiny/pearly and intact bilaterally, no exudate, visible infection or injection bilaterally Nose: Nares, septum and mucosa are normal, no drainage or sinus tenderness Neck: No adenopathy, supple, symmetrical, trachea midline, no tenderness Resp: Clear to auscultation bilaterally, no wheezes/rales/rhonchi Cardio: Regular rate and rhythm, S1, S2 normal, no murmur, click, rub or gallop, no edema, left chest Port-A-Cath covered and EMLA cream with an occlusive dressing Breasts:  Deferred GI: Soft, distended, non-tender, hypoactive bowel sounds, no  organomegaly Skin: No rashes/lesions, skin warm and dry, no erythematous areas, no cyanosis  M/S:  Atraumatic, normal strength in all extremities, normal range of motion, no clubbing  Lymph nodes: Cervical, supraclavicular, and axillary nodes normal Neurologic: Grossly normal, cranial nerves II through XII intact, alert and oriented x 3 Psych: Appropriate affect   LABORATORY DATA: Lab Results  Component Value Date   WBC 7.0 12/16/2012   HGB 10.3* 12/16/2012   HCT 31.2* 12/16/2012   MCV 83.0 12/16/2012   PLT 135* 12/16/2012      Chemistry      Component Value Date/Time   NA 142 12/16/2012 0828   NA 136 10/26/2012 0918   K 4.0 12/16/2012 0828   K 3.3* 10/26/2012 0918   CL 101 10/26/2012 0918   CO2 25 12/16/2012 0828   CO2 27 10/26/2012 0918   BUN 7.1 12/16/2012 0828   BUN 15 10/26/2012 0918   CREATININE 0.7 12/16/2012 0828   CREATININE 0.60 10/26/2012 0918      Component Value Date/Time   CALCIUM 9.6 12/16/2012 0828   CALCIUM 8.4 10/26/2012 0918   ALKPHOS 91 12/16/2012 0828   ALKPHOS 118* 10/26/2012 0918  AST 19 12/16/2012 0828   AST 12 10/26/2012 0918   ALT 22 12/16/2012 0828   ALT 18 10/26/2012 0918   BILITOT 0.99 12/16/2012 0828   BILITOT 0.6 10/26/2012 0918       RADIOGRAPHIC STUDIES: No results found.   ASSESSMENT:   Eriona Kinchen is a 42 y.o. female with:   #1 First ever mammogram found an abnormal mass in the right breast.  Ultrasound confirmed the mass to be 1.4 cm at the 6 o'clock position.   MRI revealed the mass to be a little bit bigger at 2.7 cm. Biopsy showed an invasive ductal carcinoma, grade 2, estrogen receptor/progesterone receptor positive, HER-2/neu negative, with a Ki-67 of 70%.   #2  Patient desires breast conservation and started receiving neoadjuvant chemotherapy consisting of Q2 week dose dense FEC for a total of 6 cycles.  This will be followed by Taxol weekly for a total of 12 weeks. Risks benefits and side effects of this treatment were  discussed with the patient.   #3  After definitive surgery, the plan is for the patient to receive radiation therapy adjuvantly under the direction of Dr. Basilio Cairo.  #4  Patient will need antiestrogen therapy after radiation completion because her tumor is estrogen receptor positive.  Since she has not been without a menses for one year, therefore our plan is to proceed with antiestrogen therapy of Tamoxifen for 10 years.   #5 Acute on chronic anemia and GI bleeding - patient completed blood transfusion of 2 units of PRBCs on 12/09/2012 and consult with gastroenterologist Claudette Head, M.D on 12/12/2012 .  #6 Hypokalemia - resolved   PLAN:  #1 Mary Dougherty will proceed with cycle #6 of 6 neoadjuvant chemotherapy cycle consisting of dose dense FEC. She is scheduled to receive a Neulasta injection on 12/17/2012.  She is scheduled for a nadir office visit on 12/23/2012.  Her first neoadjuvant weekly Taxol infusion is scheduled for 12/30/2012. 12 weekly neoadjuvant chemotherapy cycles with Taxol are planned.   All questions answered. Mary Dougherty and her mother were encouraged to contact us in the interim with any questions, concerns, or problems.   Larina Bras, NP-C 12/18/2012    6:12 PM

## 2012-12-17 ENCOUNTER — Ambulatory Visit (HOSPITAL_BASED_OUTPATIENT_CLINIC_OR_DEPARTMENT_OTHER): Payer: BC Managed Care – PPO

## 2012-12-17 VITALS — BP 145/69 | HR 90 | Temp 98.1°F | Resp 18

## 2012-12-17 DIAGNOSIS — C50919 Malignant neoplasm of unspecified site of unspecified female breast: Secondary | ICD-10-CM

## 2012-12-17 DIAGNOSIS — Z5189 Encounter for other specified aftercare: Secondary | ICD-10-CM

## 2012-12-17 MED ORDER — PEGFILGRASTIM INJECTION 6 MG/0.6ML
6.0000 mg | Freq: Once | SUBCUTANEOUS | Status: AC
  Administered 2012-12-17: 6 mg via SUBCUTANEOUS

## 2012-12-17 NOTE — Patient Instructions (Addendum)
[facility] Discharge Instructions for Patients Receiving Chemotherapy  Today you received the following Neulasta. You received Neulasta to boost your immune system To help prevent nausea and vomiting after your treatment, we encourage you to take your nausea medication  If you develop nausea and vomiting that is not controlled by your nausea medication, call the clinic.   BELOW ARE SYMPTOMS THAT SHOULD BE REPORTED IMMEDIATELY:  *FEVER GREATER THAN 100.5 F  *CHILLS WITH OR WITHOUT FEVER  NAUSEA AND VOMITING THAT IS NOT CONTROLLED WITH YOUR NAUSEA MEDICATION  *UNUSUAL SHORTNESS OF BREATH  *UNUSUAL BRUISING OR BLEEDING  TENDERNESS IN MOUTH AND THROAT WITH OR WITHOUT PRESENCE OF ULCERS  *URINARY PROBLEMS  *BOWEL PROBLEMS  UNUSUAL RASH Items with * indicate a potential emergency and should be followed up as soon as possible.  Feel free to call the clinic you have any questions or concerns. The clinic phone number is [phone].

## 2012-12-23 ENCOUNTER — Other Ambulatory Visit (HOSPITAL_BASED_OUTPATIENT_CLINIC_OR_DEPARTMENT_OTHER): Payer: BC Managed Care – PPO | Admitting: Lab

## 2012-12-23 ENCOUNTER — Encounter: Payer: Self-pay | Admitting: Adult Health

## 2012-12-23 ENCOUNTER — Ambulatory Visit (HOSPITAL_BASED_OUTPATIENT_CLINIC_OR_DEPARTMENT_OTHER): Payer: BC Managed Care – PPO | Admitting: Adult Health

## 2012-12-23 VITALS — BP 113/71 | HR 98 | Temp 98.7°F | Resp 18 | Ht 65.0 in | Wt 193.7 lb

## 2012-12-23 DIAGNOSIS — Z170 Estrogen receptor positive status [ER+]: Secondary | ICD-10-CM

## 2012-12-23 DIAGNOSIS — D638 Anemia in other chronic diseases classified elsewhere: Secondary | ICD-10-CM

## 2012-12-23 DIAGNOSIS — C50511 Malignant neoplasm of lower-outer quadrant of right female breast: Secondary | ICD-10-CM

## 2012-12-23 DIAGNOSIS — C50919 Malignant neoplasm of unspecified site of unspecified female breast: Secondary | ICD-10-CM

## 2012-12-23 DIAGNOSIS — K922 Gastrointestinal hemorrhage, unspecified: Secondary | ICD-10-CM

## 2012-12-23 LAB — CBC WITH DIFFERENTIAL/PLATELET
BASO%: 1.1 % (ref 0.0–2.0)
HCT: 24.7 % — ABNORMAL LOW (ref 34.8–46.6)
HGB: 8.4 g/dL — ABNORMAL LOW (ref 11.6–15.9)
LYMPH%: 12.1 % — ABNORMAL LOW (ref 14.0–49.7)
MCHC: 33.9 g/dL (ref 31.5–36.0)
MCV: 80.6 fL (ref 79.5–101.0)
MONO#: 0.1 10*3/uL (ref 0.1–0.9)
NEUT%: 83.7 % — ABNORMAL HIGH (ref 38.4–76.8)
RBC: 3.07 10*6/uL — ABNORMAL LOW (ref 3.70–5.45)
RDW: 19.6 % — ABNORMAL HIGH (ref 11.2–14.5)
WBC: 2.6 10*3/uL — ABNORMAL LOW (ref 3.9–10.3)
lymph#: 0.3 10*3/uL — ABNORMAL LOW (ref 0.9–3.3)

## 2012-12-23 LAB — COMPREHENSIVE METABOLIC PANEL (CC13)
ALT: 15 U/L (ref 0–55)
AST: 16 U/L (ref 5–34)
Albumin: 3.8 g/dL (ref 3.5–5.0)
Alkaline Phosphatase: 112 U/L (ref 40–150)
Anion Gap: 9 mEq/L (ref 3–11)
BUN: 7.7 mg/dL (ref 7.0–26.0)
CO2: 24 mEq/L (ref 22–29)
Calcium: 9.1 mg/dL (ref 8.4–10.4)
Chloride: 107 mEq/L (ref 98–109)
Creatinine: 0.6 mg/dL (ref 0.6–1.1)
Glucose: 139 mg/dl (ref 70–140)
Potassium: 3.9 mEq/L (ref 3.5–5.1)
Sodium: 141 mEq/L (ref 136–145)
Total Bilirubin: 1.03 mg/dL (ref 0.20–1.20)
Total Protein: 6.5 g/dL (ref 6.4–8.3)

## 2012-12-23 NOTE — Patient Instructions (Signed)
Paclitaxel injection  What is this medicine?  PACLITAXEL (PAK li TAX el) is a chemotherapy drug. It targets fast dividing cells, like cancer cells, and causes these cells to die. This medicine is used to treat ovarian cancer, breast cancer, and other cancers.  This medicine may be used for other purposes; ask your health care provider or pharmacist if you have questions.  COMMON BRAND NAME(S): Onxol , Taxol  What should I tell my health care provider before I take this medicine?  They need to know if you have any of these conditions:  -blood disorders  -irregular heartbeat  -infection (especially a virus infection such as chickenpox, cold sores, or herpes)  -liver disease  -previous or ongoing radiation therapy  -an unusual or allergic reaction to paclitaxel, alcohol, polyoxyethylated castor oil, other chemotherapy agents, other medicines, foods, dyes, or preservatives  -pregnant or trying to get pregnant  -breast-feeding  How should I use this medicine?  This drug is given as an infusion into a vein. It is administered in a hospital or clinic by a specially trained health care professional.  Talk to your pediatrician regarding the use of this medicine in children. Special care may be needed.  Overdosage: If you think you have taken too much of this medicine contact a poison control center or emergency room at once.  NOTE: This medicine is only for you. Do not share this medicine with others.  What if I miss a dose?  It is important not to miss your dose. Call your doctor or health care professional if you are unable to keep an appointment.  What may interact with this medicine?  Do not take this medicine with any of the following medications:  -disulfiram  -metronidazole  This medicine may also interact with the following medications:  -cyclosporine  -diazepam  -ketoconazole  -medicines to increase blood counts like filgrastim, pegfilgrastim, sargramostim  -other chemotherapy drugs like cisplatin, doxorubicin,  epirubicin, etoposide, teniposide, vincristine  -quinidine  -testosterone  -vaccines  -verapamil  Talk to your doctor or health care professional before taking any of these medicines:  -acetaminophen  -aspirin  -ibuprofen  -ketoprofen  -naproxen  This list may not describe all possible interactions. Give your health care provider a list of all the medicines, herbs, non-prescription drugs, or dietary supplements you use. Also tell them if you smoke, drink alcohol, or use illegal drugs. Some items may interact with your medicine.  What should I watch for while using this medicine?  Your condition will be monitored carefully while you are receiving this medicine. You will need important blood work done while you are taking this medicine.  This drug may make you feel generally unwell. This is not uncommon, as chemotherapy can affect healthy cells as well as cancer cells. Report any side effects. Continue your course of treatment even though you feel ill unless your doctor tells you to stop.  In some cases, you may be given additional medicines to help with side effects. Follow all directions for their use.  Call your doctor or health care professional for advice if you get a fever, chills or sore throat, or other symptoms of a cold or flu. Do not treat yourself. This drug decreases your body's ability to fight infections. Try to avoid being around people who are sick.  This medicine may increase your risk to bruise or bleed. Call your doctor or health care professional if you notice any unusual bleeding.  Be careful brushing and flossing your teeth or   using a toothpick because you may get an infection or bleed more easily. If you have any dental work done, tell your dentist you are receiving this medicine.  Avoid taking products that contain aspirin, acetaminophen, ibuprofen, naproxen, or ketoprofen unless instructed by your doctor. These medicines may hide a fever.  Do not become pregnant while taking this medicine.  Women should inform their doctor if they wish to become pregnant or think they might be pregnant. There is a potential for serious side effects to an unborn child. Talk to your health care professional or pharmacist for more information. Do not breast-feed an infant while taking this medicine.  Men are advised not to father a child while receiving this medicine.  What side effects may I notice from receiving this medicine?  Side effects that you should report to your doctor or health care professional as soon as possible:  -allergic reactions like skin rash, itching or hives, swelling of the face, lips, or tongue  -low blood counts - This drug may decrease the number of white blood cells, red blood cells and platelets. You may be at increased risk for infections and bleeding.  -signs of infection - fever or chills, cough, sore throat, pain or difficulty passing urine  -signs of decreased platelets or bleeding - bruising, pinpoint red spots on the skin, black, tarry stools, nosebleeds  -signs of decreased red blood cells - unusually weak or tired, fainting spells, lightheadedness  -breathing problems  -chest pain  -high or low blood pressure  -mouth sores  -nausea and vomiting  -pain, swelling, redness or irritation at the injection site  -pain, tingling, numbness in the hands or feet  -slow or irregular heartbeat  -swelling of the ankle, feet, hands  Side effects that usually do not require medical attention (report to your doctor or health care professional if they continue or are bothersome):  -bone pain  -complete hair loss including hair on your head, underarms, pubic hair, eyebrows, and eyelashes  -changes in the color of fingernails  -diarrhea  -loosening of the fingernails  -loss of appetite  -muscle or joint pain  -red flush to skin  -sweating  This list may not describe all possible side effects. Call your doctor for medical advice about side effects. You may report side effects to FDA at  1-800-FDA-1088.  Where should I keep my medicine?  This drug is given in a hospital or clinic and will not be stored at home.  NOTE: This sheet is a summary. It may not cover all possible information. If you have questions about this medicine, talk to your doctor, pharmacist, or health care provider.   2014, [employer]vier/[employer] Standard. (2012-03-14 16:41:21)

## 2012-12-23 NOTE — Progress Notes (Signed)
[facility]  Telephone:(336) [phone] Fax:(336) [phone]  OFFICE PROGRESS NOTE    ID: Mary Dougherty   DOB: 22-Oct-1970  MR#: [id number]  QMV#:[id number]   PCP: Lucilla Edin, MD GI: Claudette Head, M.D.   DIAGNOSIS:  Mary Dougherty is a 42 y.o. female diagnosed in 09/2012 with invasive ductal carcinoma of the right breast that is estrogen receptor positive, progesterone receptor positive, HER-2/neu negative.   PRIOR THERAPY: 1.  Patient went for her initial screening mammogram at the insistence of her sister. She was found to have a mass in the right breast. The ultrasound demonstrated a 1.4 cm area of concern. She had an MRI performed that revealed a 2.7 cm irregular area at the 6 o'clock location. There were no masses in the left breast no abnormal lymph nodes. Patient had a biopsy performed of the right breast that revealed an intermediate to high-grade invasive ductal carcinoma the tumor was estrogen receptor positive 99%, progesterone receptor positive 100%,  HER-2/neu negative with a Ki-67 of 70%.   2. received neoadjuvant chemotherapy with dose dense FEC (5-FU/epirubicin/Cytoxan) from 10/07/2012 through 12/16/12.    3.  Acute on chronic anemia.   CURRENT THERAPY: FEC cycle 6 day 8  INTERVAL HISTORY: Mary Dougherty is a  42 y.o. female who returns today for evaluation after receiving her final cycle of FEC.  She is doing well.  She continues to have mild hemorrhoids and minimal bleeding after bowel movements she describes as only on the toilet paper.  She has mild intermittent clear nasal drainage that occurs once per day.  Otherwise, she denies fevers, chills, vomiting, constipation, diarrhea, nasal pain, sinus pain, cough, pleurisy, shortness of breath, DOE, numbness or any further concerns.   MEDICAL HISTORY: Past Medical History  Diagnosis Date  . Anemia   . Thyroid disease   . Wears glasses   . Breast cancer 2014    ER+/PR+/Her2-  .  Spherocytosis 1989    ALLERGIES:   No Known Allergies   MEDICATIONS:  Current Outpatient Prescriptions  Medication Sig Dispense Refill  . Cholecalciferol (VITAMIN D3) 1000 UNITS CAPS Take 1 capsule by mouth daily.      Marland Kitchen docusate sodium (COLACE) 100 MG capsule Take 100 mg by mouth 3 (three) times daily as needed.       . folic acid (FOLVITE) 400 MCG tablet Take 400 mcg by mouth 2 (two) times daily.       Marland Kitchen lidocaine-prilocaine (EMLA) cream Apply topically as needed.  30 g  6  . Prenatal Vit-Fe Fumarate-FA (PRENATAL MULTIVITAMIN) TABS tablet Take 1 tablet by mouth daily at 12 noon.      . valACYclovir (VALTREX) 500 MG tablet Take 500 mg by mouth daily.       Marland Kitchen LORazepam (ATIVAN) 0.5 MG tablet Take 1 tablet (0.5 mg total) by mouth every 6 (six) hours as needed (Nausea or vomiting).  30 tablet  0  . ondansetron (ZOFRAN ODT) 4 MG disintegrating tablet 4mg  ODT q4 hours prn nausea/vomit  4 tablet  0  . oxyCODONE (OXY IR/ROXICODONE) 5 MG immediate release tablet Take 1 tablet (5 mg total) by mouth every 4 (four) hours as needed for pain.  30 tablet  0   No current facility-administered medications for this visit.    SURGICAL HISTORY:  Past Surgical History  Procedure Laterality Date  . Wisdom tooth extraction    . Portacath placement Left 09/20/2012    Procedure: INSERTION PORT-A-CATH;  Surgeon: Molli Hazard  Dwain Sarna, MD;  Location: [facility];  Service: General;  Laterality: Left;    REVIEW OF SYSTEMS:  A 10 point review of systems was completed and is negative except as stated above.    PHYSICAL EXAMINATION: Blood pressure 113/71, pulse 98, temperature 98.7 F (37.1 C), temperature source Oral, resp. rate 18, height 5\' 5"  (1.651 m), weight 193 lb 11.2 oz (87.862 kg). Body mass index is 32.23 kg/(m^2). General: Patient is a well appearing female in no acute distress HEENT: PERRLA, sclerae anicteric no conjunctival pallor, MMM Neck: supple, no palpable adenopathy Lungs:  clear to auscultation bilaterally, no wheezes, rhonchi, or rales Cardiovascular: regular rate rhythm, S1, S2, no murmurs, rubs or gallops Abdomen: Soft, non-tender, non-distended, normoactive bowel sounds, no HSM Extremities: warm and well perfused, no clubbing, cyanosis, or edema Skin: No rashes or lesions Neuro: Non-focal Breasts: right breast nodule at 6 is softer,a bout 1-2cm ECOG FS: 1 - Symptomatic but completely ambulatory  LABORATORY DATA: Lab Results  Component Value Date   WBC 2.6* 12/23/2012   HGB 8.4* 12/23/2012   HCT 24.7* 12/23/2012   MCV 80.6 12/23/2012   PLT 155 12/23/2012      Chemistry      Component Value Date/Time   NA 141 12/23/2012 0852   NA 136 10/26/2012 0918   K 3.9 12/23/2012 0852   K 3.3* 10/26/2012 0918   CL 101 10/26/2012 0918   CO2 24 12/23/2012 0852   CO2 27 10/26/2012 0918   BUN 7.7 12/23/2012 0852   BUN 15 10/26/2012 0918   CREATININE 0.6 12/23/2012 0852   CREATININE 0.60 10/26/2012 0918      Component Value Date/Time   CALCIUM 9.1 12/23/2012 0852   CALCIUM 8.4 10/26/2012 0918   ALKPHOS 112 12/23/2012 0852   ALKPHOS 118* 10/26/2012 0918   AST 16 12/23/2012 0852   AST 12 10/26/2012 0918   ALT 15 12/23/2012 0852   ALT 18 10/26/2012 0918   BILITOT 1.03 12/23/2012 0852   BILITOT 0.6 10/26/2012 0918       RADIOGRAPHIC STUDIES: No results found.   ASSESSMENT:   Mary Dougherty is a 42 y.o. female with:   #1 First ever mammogram found an abnormal mass in the right breast.  Ultrasound confirmed the mass to be 1.4 cm at the 6 o'clock position.   MRI revealed the mass to be a little bit bigger at 2.7 cm. Biopsy showed an invasive ductal carcinoma, grade 2, estrogen receptor/progesterone receptor positive, HER-2/neu negative, with a Ki-67 of 70%.   #2  Patient desires breast conservation and started receiving neoadjuvant chemotherapy consisting of Q2 week dose dense FEC for a total of 6 cycles.  This will be followed by Taxol weekly for a total  of 12 weeks. Risks benefits and side effects of this treatment were discussed with the patient.   #3  After definitive surgery, the plan is for the patient to receive radiation therapy adjuvantly under the direction of Dr. Basilio Cairo.  #4  Patient will need antiestrogen therapy after radiation completion because her tumor is estrogen receptor positive.  Since she has not been without a menses for one year, therefore our plan is to proceed with antiestrogen therapy of Tamoxifen for 10 years.   #5 Acute on chronic anemia and GI bleeding - patient completed blood transfusion of 2 units of PRBCs on 12/09/2012 and consult with gastroenterologist Claudette Head, M.D on 12/12/2012 .  #6 Hypokalemia - resolved   PLAN:  #1 Doing well.  Her labs are stable, she is not neutropenic.  I reviewed the labs with her in detail.    #2 She and I reviewed Taxol next week and what to expect, adverse effects to monitor for, risks, benefits of treatment.   I gave her detailed information in her AVS and encouraged her to review it and bring back any questions next week prior to her treatment.    #3 We will see her next week for labs, an evaluation and Taxol chemotherapy.    All questions answered. Ms. Meckley and her mother were encouraged to contact us in the interim with any questions, concerns, or problems.   I spent 25 minutes counseling the patient face to face.  The total time spent in the appointment was 30 minutes.  Illa Level, NP Medical Oncology [facility] [phone] 12/23/2012    12:40 PM

## 2012-12-30 ENCOUNTER — Ambulatory Visit (HOSPITAL_BASED_OUTPATIENT_CLINIC_OR_DEPARTMENT_OTHER): Payer: BC Managed Care – PPO

## 2012-12-30 ENCOUNTER — Other Ambulatory Visit: Payer: Self-pay | Admitting: Hematology & Oncology

## 2012-12-30 ENCOUNTER — Encounter: Payer: Self-pay | Admitting: Adult Health

## 2012-12-30 ENCOUNTER — Ambulatory Visit (HOSPITAL_BASED_OUTPATIENT_CLINIC_OR_DEPARTMENT_OTHER): Payer: BC Managed Care – PPO | Admitting: Adult Health

## 2012-12-30 ENCOUNTER — Other Ambulatory Visit (HOSPITAL_BASED_OUTPATIENT_CLINIC_OR_DEPARTMENT_OTHER): Payer: BC Managed Care – PPO | Admitting: Lab

## 2012-12-30 VITALS — BP 128/77 | HR 81 | Temp 98.4°F | Resp 18 | Ht 65.0 in | Wt 198.2 lb

## 2012-12-30 VITALS — BP 114/74 | HR 75 | Temp 98.7°F | Resp 20

## 2012-12-30 DIAGNOSIS — C50511 Malignant neoplasm of lower-outer quadrant of right female breast: Secondary | ICD-10-CM

## 2012-12-30 DIAGNOSIS — C50919 Malignant neoplasm of unspecified site of unspecified female breast: Secondary | ICD-10-CM

## 2012-12-30 DIAGNOSIS — K922 Gastrointestinal hemorrhage, unspecified: Secondary | ICD-10-CM

## 2012-12-30 DIAGNOSIS — D649 Anemia, unspecified: Secondary | ICD-10-CM

## 2012-12-30 DIAGNOSIS — Z5111 Encounter for antineoplastic chemotherapy: Secondary | ICD-10-CM

## 2012-12-30 DIAGNOSIS — Z170 Estrogen receptor positive status [ER+]: Secondary | ICD-10-CM

## 2012-12-30 LAB — COMPREHENSIVE METABOLIC PANEL (CC13)
ALT: 23 U/L (ref 0–55)
AST: 28 U/L (ref 5–34)
Albumin: 3.6 g/dL (ref 3.5–5.0)
Alkaline Phosphatase: 83 U/L (ref 40–150)
Anion Gap: 7 mEq/L (ref 3–11)
BUN: 4.7 mg/dL — ABNORMAL LOW (ref 7.0–26.0)
Calcium: 8.9 mg/dL (ref 8.4–10.4)
Chloride: 110 mEq/L — ABNORMAL HIGH (ref 98–109)
Potassium: 4 mEq/L (ref 3.5–5.1)
Total Bilirubin: 0.71 mg/dL (ref 0.20–1.20)

## 2012-12-30 LAB — CBC WITH DIFFERENTIAL/PLATELET
BASO%: 3.3 % — ABNORMAL HIGH (ref 0.0–2.0)
EOS%: 0.6 % (ref 0.0–7.0)
HGB: 8.3 g/dL — ABNORMAL LOW (ref 11.6–15.9)
MCH: 26.9 pg (ref 25.1–34.0)
MCHC: 31.7 g/dL (ref 31.5–36.0)
MCV: 85.1 fL (ref 79.5–101.0)
MONO%: 15.4 % — ABNORMAL HIGH (ref 0.0–14.0)
RBC: 3.08 10*6/uL — ABNORMAL LOW (ref 3.70–5.45)
RDW: 21.4 % — ABNORMAL HIGH (ref 11.2–14.5)
WBC: 4.9 10*3/uL (ref 3.9–10.3)
lymph#: 0.9 10*3/uL (ref 0.9–3.3)

## 2012-12-30 MED ORDER — SODIUM CHLORIDE 0.9 % IV SOLN
Freq: Once | INTRAVENOUS | Status: AC
  Administered 2012-12-30: 11:00:00 via INTRAVENOUS

## 2012-12-30 MED ORDER — DEXAMETHASONE SODIUM PHOSPHATE 20 MG/5ML IJ SOLN
20.0000 mg | Freq: Once | INTRAMUSCULAR | Status: AC
  Administered 2012-12-30: 20 mg via INTRAVENOUS

## 2012-12-30 MED ORDER — DIPHENHYDRAMINE HCL 50 MG/ML IJ SOLN
50.0000 mg | Freq: Once | INTRAMUSCULAR | Status: AC
  Administered 2012-12-30: 50 mg via INTRAVENOUS

## 2012-12-30 MED ORDER — SODIUM CHLORIDE 0.9 % IJ SOLN
10.0000 mL | INTRAMUSCULAR | Status: DC | PRN
  Administered 2012-12-30: 10 mL
  Filled 2012-12-30: qty 10

## 2012-12-30 MED ORDER — ONDANSETRON 8 MG/50ML IVPB (CHCC)
8.0000 mg | Freq: Once | INTRAVENOUS | Status: AC
  Administered 2012-12-30: 8 mg via INTRAVENOUS

## 2012-12-30 MED ORDER — DEXAMETHASONE SODIUM PHOSPHATE 20 MG/5ML IJ SOLN
INTRAMUSCULAR | Status: AC
  Filled 2012-12-30: qty 5

## 2012-12-30 MED ORDER — ONDANSETRON 8 MG/NS 50 ML IVPB
INTRAVENOUS | Status: AC
  Filled 2012-12-30: qty 8

## 2012-12-30 MED ORDER — DIPHENHYDRAMINE HCL 50 MG/ML IJ SOLN
INTRAMUSCULAR | Status: AC
  Filled 2012-12-30: qty 1

## 2012-12-30 MED ORDER — HEPARIN SOD (PORK) LOCK FLUSH 100 UNIT/ML IV SOLN
500.0000 [IU] | Freq: Once | INTRAVENOUS | Status: AC | PRN
  Administered 2012-12-30: 500 [IU]
  Filled 2012-12-30: qty 5

## 2012-12-30 MED ORDER — DEXTROSE 5 % IV SOLN
80.0000 mg/m2 | Freq: Once | INTRAVENOUS | Status: AC
  Administered 2012-12-30: 162 mg via INTRAVENOUS
  Filled 2012-12-30: qty 27

## 2012-12-30 MED ORDER — FAMOTIDINE IN NACL 20-0.9 MG/50ML-% IV SOLN
INTRAVENOUS | Status: AC
  Filled 2012-12-30: qty 50

## 2012-12-30 MED ORDER — FAMOTIDINE IN NACL 20-0.9 MG/50ML-% IV SOLN
20.0000 mg | Freq: Once | INTRAVENOUS | Status: AC
  Administered 2012-12-30: 20 mg via INTRAVENOUS

## 2012-12-30 NOTE — Patient Instructions (Signed)
[facility] Discharge Instructions for Patients Receiving Chemotherapy  Today you received the following chemotherapy agents taxol.  To help prevent nausea and vomiting after your treatment, we encourage you to take your nausea medication zofran.   If you develop nausea and vomiting that is not controlled by your nausea medication, call the clinic.   BELOW ARE SYMPTOMS THAT SHOULD BE REPORTED IMMEDIATELY:  *FEVER GREATER THAN 100.5 F  *CHILLS WITH OR WITHOUT FEVER  NAUSEA AND VOMITING THAT IS NOT CONTROLLED WITH YOUR NAUSEA MEDICATION  *UNUSUAL SHORTNESS OF BREATH  *UNUSUAL BRUISING OR BLEEDING  TENDERNESS IN MOUTH AND THROAT WITH OR WITHOUT PRESENCE OF ULCERS  *URINARY PROBLEMS  *BOWEL PROBLEMS  UNUSUAL RASH Items with * indicate a potential emergency and should be followed up as soon as possible.  Feel free to call the clinic you have any questions or concerns. The clinic phone number is [phone].    

## 2012-12-30 NOTE — Progress Notes (Signed)
[facility]  Telephone:([phone] Fax:([phone]) [phone]  OFFICE PROGRESS NOTE    ID: Mary Dougherty   DOB: [birth year]  MR#: [id number]  ZHY#:[id number]   PCP: Lucilla Edin, MD GI: Claudette Head, M.D.   DIAGNOSIS:  Mary Dougherty is a 42 y.o. female diagnosed in 09/2012 with invasive ductal carcinoma of the right breast that is estrogen receptor positive, progesterone receptor positive, HER-2/neu negative.   PRIOR THERAPY: 1.  Patient went for her initial screening mammogram at the insistence of her sister. She was found to have a mass in the right breast. The ultrasound demonstrated a 1.4 cm area of concern. She had an MRI performed that revealed a 2.7 cm irregular area at the 6 o'clock location. There were no masses in the left breast no abnormal lymph nodes. Patient had a biopsy performed of the right breast that revealed an intermediate to high-grade invasive ductal carcinoma the tumor was estrogen receptor positive 99%, progesterone receptor positive 100%,  HER-2/neu negative with a Ki-67 of 70%.   2. received neoadjuvant chemotherapy with dose dense FEC (5-FU/epirubicin/Cytoxan) from 10/07/2012 through 12/16/12.  She started weekly Taxol on 12/30/12.    3.  Acute on chronic anemia.   CURRENT THERAPY: Weekly Taxol  INTERVAL HISTORY: Mary Dougherty is a  42 y.o. female who returns today for evaluation prior to her weekly Taxol.  She is doing well today.  She has mild clear nasal drainage and an occasional cough that has been going on x 2 days.  Otherwise, she denies fevers, chills, chest pain, shortness of breath, DOE, nausea, vomiting, constipation, diarrhea, numbness or any further concerns.    MEDICAL HISTORY: Past Medical History  Diagnosis Date  . Anemia   . Thyroid disease   . Wears glasses   . Breast cancer 2014    ER+/PR+/Her2-  . Spherocytosis 1989    ALLERGIES:   No Known Allergies   MEDICATIONS:  Current Outpatient  Prescriptions  Medication Sig Dispense Refill  . Cholecalciferol (VITAMIN D3) 1000 UNITS CAPS Take 1 capsule by mouth daily.      Marland Kitchen docusate sodium (COLACE) 100 MG capsule Take 100 mg by mouth 3 (three) times daily as needed.       . folic acid (FOLVITE) 400 MCG tablet Take 400 mcg by mouth 2 (two) times daily.       Marland Kitchen lidocaine-prilocaine (EMLA) cream Apply topically as needed.  30 g  6  . ondansetron (ZOFRAN ODT) 4 MG disintegrating tablet 4mg  ODT q4 hours prn nausea/vomit  4 tablet  0  . Prenatal Vit-Fe Fumarate-FA (PRENATAL MULTIVITAMIN) TABS tablet Take 1 tablet by mouth daily at 12 noon.      . valACYclovir (VALTREX) 500 MG tablet Take 500 mg by mouth daily.       Marland Kitchen LORazepam (ATIVAN) 0.5 MG tablet Take 1 tablet (0.5 mg total) by mouth every 6 (six) hours as needed (Nausea or vomiting).  30 tablet  0  . oxyCODONE (OXY IR/ROXICODONE) 5 MG immediate release tablet Take 1 tablet (5 mg total) by mouth every 4 (four) hours as needed for pain.  30 tablet  0   No current facility-administered medications for this visit.    SURGICAL HISTORY:  Past Surgical History  Procedure Laterality Date  . Wisdom tooth extraction    . Portacath placement Left 09/20/2012    Procedure: INSERTION PORT-A-CATH;  Surgeon: Emelia Loron, MD;  Location: [facility];  Service: General;  Laterality: Left;  REVIEW OF SYSTEMS:  A 10 point review of systems was completed and is negative except as stated above.    PHYSICAL EXAMINATION: Blood pressure 128/77, pulse 81, temperature 98.4 F (36.9 C), temperature source Oral, resp. rate 18, height 5\' 5"  (1.651 m), weight 198 lb 3.2 oz (89.903 kg). Body mass index is 32.98 kg/(m^2). General: Patient is a well appearing female in no acute distress HEENT: PERRLA, sclerae anicteric no conjunctival pallor, MMM Neck: supple, no palpable adenopathy Lungs: clear to auscultation bilaterally, no wheezes, rhonchi, or rales Cardiovascular: regular rate  rhythm, S1, S2, no murmurs, rubs or gallops Abdomen: Soft, non-tender, non-distended, normoactive bowel sounds, no HSM Extremities: warm and well perfused, no clubbing, cyanosis, or edema Skin: No rashes or lesions Neuro: Non-focal Breasts: right breast nodule at 6 is softer,a bout 1-2cm ECOG FS: 1 - Symptomatic but completely ambulatory  LABORATORY DATA: Lab Results  Component Value Date   WBC 4.9 12/30/2012   HGB 8.3* 12/30/2012   HCT 26.2* 12/30/2012   MCV 85.1 12/30/2012   PLT 149 12/30/2012      Chemistry      Component Value Date/Time   NA 141 12/30/2012 0829   NA 136 10/26/2012 0918   K 4.0 12/30/2012 0829   K 3.3* 10/26/2012 0918   CL 101 10/26/2012 0918   CO2 24 12/30/2012 0829   CO2 27 10/26/2012 0918   BUN 4.7* 12/30/2012 0829   BUN 15 10/26/2012 0918   CREATININE 0.6 12/30/2012 0829   CREATININE 0.60 10/26/2012 0918      Component Value Date/Time   CALCIUM 8.9 12/30/2012 0829   CALCIUM 8.4 10/26/2012 0918   ALKPHOS 83 12/30/2012 0829   ALKPHOS 118* 10/26/2012 0918   AST 28 12/30/2012 0829   AST 12 10/26/2012 0918   ALT 23 12/30/2012 0829   ALT 18 10/26/2012 0918   BILITOT 0.71 12/30/2012 0829   BILITOT 0.6 10/26/2012 0918       RADIOGRAPHIC STUDIES: No results found.   ASSESSMENT:   Mary Dougherty is a 42 y.o. female with:   #1 First ever mammogram found an abnormal mass in the right breast.  Ultrasound confirmed the mass to be 1.4 cm at the 6 o'clock position.   MRI revealed the mass to be a little bit bigger at 2.7 cm. Biopsy showed an invasive ductal carcinoma, grade 2, estrogen receptor/progesterone receptor positive, HER-2/neu negative, with a Ki-67 of 70%.   #2  Patient desires breast conservation and started receiving neoadjuvant chemotherapy consisting of Q2 week dose dense FEC for a total of 6 cycles.  This will be followed by Taxol weekly for a total of 12 weeks. Risks benefits and side effects of this treatment were discussed with the patient.    #3  After definitive surgery, the plan is for the patient to receive radiation therapy adjuvantly under the direction of Dr. Basilio Cairo.  #4  Patient will need antiestrogen therapy after radiation completion because her tumor is estrogen receptor positive.  Since she has not been without a menses for one year, therefore our plan is to proceed with antiestrogen therapy of Tamoxifen for 10 years.   #5 Acute on chronic anemia and GI bleeding - patient completed blood transfusion of 2 units of PRBCs on 12/09/2012 and consult with gastroenterologist Claudette Head, M.D on 12/12/2012 .  PLAN:  #1 Patient is doing well.  I reviewed her CBC with her and it is stable.  She will proceed with weekly Taxol today.    #  2 She will try taking Claritin or Zyrtec for the nasal drainage.  If it worsens throughout the week, she will call us.   #3  She will return in one week for labs, evaluation and weekly taxol.     All questions answered. Mary Dougherty and her mother were encouraged to contact us in the interim with any questions, concerns, or problems.   I spent 25 minutes counseling the patient face to face.  The total time spent in the appointment was 30 minutes.  Illa Level, NP Medical Oncology [facility] [phone] 01/01/2013    8:41 AM

## 2012-12-31 ENCOUNTER — Ambulatory Visit: Payer: BC Managed Care – PPO

## 2013-01-02 ENCOUNTER — Ambulatory Visit (HOSPITAL_BASED_OUTPATIENT_CLINIC_OR_DEPARTMENT_OTHER): Payer: BC Managed Care – PPO

## 2013-01-02 ENCOUNTER — Telehealth: Payer: Self-pay | Admitting: *Deleted

## 2013-01-02 ENCOUNTER — Ambulatory Visit (HOSPITAL_BASED_OUTPATIENT_CLINIC_OR_DEPARTMENT_OTHER): Payer: BC Managed Care – PPO | Admitting: Adult Health

## 2013-01-02 ENCOUNTER — Encounter: Payer: Self-pay | Admitting: Adult Health

## 2013-01-02 ENCOUNTER — Ambulatory Visit (HOSPITAL_BASED_OUTPATIENT_CLINIC_OR_DEPARTMENT_OTHER): Payer: BC Managed Care – PPO | Admitting: Lab

## 2013-01-02 VITALS — BP 106/49 | HR 81 | Temp 98.3°F | Resp 18

## 2013-01-02 VITALS — BP 106/68 | HR 84 | Temp 98.0°F | Resp 18 | Ht 65.0 in | Wt 197.1 lb

## 2013-01-02 DIAGNOSIS — D649 Anemia, unspecified: Secondary | ICD-10-CM

## 2013-01-02 DIAGNOSIS — C50919 Malignant neoplasm of unspecified site of unspecified female breast: Secondary | ICD-10-CM

## 2013-01-02 DIAGNOSIS — R5381 Other malaise: Secondary | ICD-10-CM

## 2013-01-02 DIAGNOSIS — K922 Gastrointestinal hemorrhage, unspecified: Secondary | ICD-10-CM

## 2013-01-02 DIAGNOSIS — C50511 Malignant neoplasm of lower-outer quadrant of right female breast: Secondary | ICD-10-CM

## 2013-01-02 DIAGNOSIS — Z170 Estrogen receptor positive status [ER+]: Secondary | ICD-10-CM

## 2013-01-02 DIAGNOSIS — R55 Syncope and collapse: Secondary | ICD-10-CM

## 2013-01-02 DIAGNOSIS — E860 Dehydration: Secondary | ICD-10-CM

## 2013-01-02 LAB — CBC WITH DIFFERENTIAL/PLATELET
BASO%: 1 % (ref 0.0–2.0)
Basophils Absolute: 0.1 10*3/uL (ref 0.0–0.1)
EOS%: 0.1 % (ref 0.0–7.0)
Eosinophils Absolute: 0 10*3/uL (ref 0.0–0.5)
HCT: 25.8 % — ABNORMAL LOW (ref 34.8–46.6)
HGB: 8.6 g/dL — ABNORMAL LOW (ref 11.6–15.9)
LYMPH%: 8.5 % — ABNORMAL LOW (ref 14.0–49.7)
MCH: 27.7 pg (ref 25.1–34.0)
MCHC: 33.3 g/dL (ref 31.5–36.0)
MCV: 83.2 fL (ref 79.5–101.0)
MONO#: 0.3 10*3/uL (ref 0.1–0.9)
NEUT#: 4.7 10*3/uL (ref 1.5–6.5)
NEUT%: 84.6 % — ABNORMAL HIGH (ref 38.4–76.8)
Platelets: 180 10*3/uL (ref 145–400)
WBC: 5.5 10*3/uL (ref 3.9–10.3)
lymph#: 0.5 10*3/uL — ABNORMAL LOW (ref 0.9–3.3)

## 2013-01-02 LAB — COMPREHENSIVE METABOLIC PANEL (CC13)
AST: 20 U/L (ref 5–34)
Albumin: 3.7 g/dL (ref 3.5–5.0)
Anion Gap: 9 mEq/L (ref 3–11)
BUN: 12.8 mg/dL (ref 7.0–26.0)
CO2: 26 mEq/L (ref 22–29)
Calcium: 8.9 mg/dL (ref 8.4–10.4)
Chloride: 105 mEq/L (ref 98–109)
Creatinine: 0.7 mg/dL (ref 0.6–1.1)
Glucose: 120 mg/dl (ref 70–140)
Total Bilirubin: 1.08 mg/dL (ref 0.20–1.20)

## 2013-01-02 LAB — HOLD TUBE, BLOOD BANK

## 2013-01-02 MED ORDER — SODIUM CHLORIDE 0.9 % IV SOLN
Freq: Once | INTRAVENOUS | Status: AC
  Administered 2013-01-02: 15:00:00 via INTRAVENOUS

## 2013-01-02 MED ORDER — SODIUM CHLORIDE 0.9 % IJ SOLN
10.0000 mL | INTRAMUSCULAR | Status: DC | PRN
  Administered 2013-01-02: 10 mL via INTRAVENOUS
  Filled 2013-01-02: qty 10

## 2013-01-02 MED ORDER — HEPARIN SOD (PORK) LOCK FLUSH 100 UNIT/ML IV SOLN
500.0000 [IU] | Freq: Once | INTRAVENOUS | Status: AC
  Administered 2013-01-02: 500 [IU] via INTRAVENOUS
  Filled 2013-01-02: qty 5

## 2013-01-02 NOTE — Patient Instructions (Signed)
Dehydration, Adult Dehydration is when you lose more fluids from the body than you take in. Vital organs like the kidneys, brain, and heart cannot function without a proper amount of fluids and salt. Any loss of fluids from the body can cause dehydration.  CAUSES   Vomiting.  Diarrhea.  Excessive sweating.  Excessive urine output.  Fever. SYMPTOMS  Mild dehydration  Thirst.  Dry lips.  Slightly dry mouth. Moderate dehydration  Very dry mouth.  Sunken eyes.  Skin does not bounce back quickly when lightly pinched and released.  Dark urine and decreased urine production.  Decreased tear production.  Headache. Severe dehydration  Very dry mouth.  Extreme thirst.  Rapid, weak pulse (more than 100 beats per minute at rest).  Cold hands and feet.  Not able to sweat in spite of heat and temperature.  Rapid breathing.  Blue lips.  Confusion and lethargy.  Difficulty being awakened.  Minimal urine production.  No tears. DIAGNOSIS  Your caregiver will diagnose dehydration based on your symptoms and your exam. Blood and urine tests will help confirm the diagnosis. The diagnostic evaluation should also identify the cause of dehydration. TREATMENT  Treatment of mild or moderate dehydration can often be done at home by increasing the amount of fluids that you drink. It is best to drink small amounts of fluid more often. Drinking too much at one time can make vomiting worse. Refer to the home care instructions below. Severe dehydration needs to be treated at the hospital where you will probably be given intravenous (IV) fluids that contain water and electrolytes. HOME CARE INSTRUCTIONS   Ask your caregiver about specific rehydration instructions.  Drink enough fluids to keep your urine clear or pale yellow.  Drink small amounts frequently if you have nausea and vomiting.  Eat as you normally do.  Avoid:  Foods or drinks high in sugar.  Carbonated  drinks.  Juice.  Extremely hot or cold fluids.  Drinks with caffeine.  Fatty, greasy foods.  Alcohol.  Tobacco.  Overeating.  Gelatin desserts.  Wash your hands well to avoid spreading bacteria and viruses.  Only take over-the-counter or prescription medicines for pain, discomfort, or fever as directed by your caregiver.  Ask your caregiver if you should continue all prescribed and over-the-counter medicines.  Keep all follow-up appointments with your caregiver. SEEK MEDICAL CARE IF:  You have abdominal pain and it increases or stays in one area (localizes).  You have a rash, stiff neck, or severe headache.  You are irritable, sleepy, or difficult to awaken.  You are weak, dizzy, or extremely thirsty. SEEK IMMEDIATE MEDICAL CARE IF:   You are unable to keep fluids down or you get worse despite treatment.  You have frequent episodes of vomiting or diarrhea.  You have blood or green matter (bile) in your vomit.  You have blood in your stool or your stool looks black and tarry.  You have not urinated in 6 to 8 hours, or you have only urinated a small amount of very dark urine.  You have a fever.  You faint. MAKE SURE YOU:   Understand these instructions.  Will watch your condition.  Will get help right away if you are not doing well or get worse. Document Released: 01/19/2005 Document Revised: 04/13/2011 Document Reviewed: 09/08/2010 Exit[employer] Patient Information 2014 [employer].  

## 2013-01-02 NOTE — Telephone Encounter (Signed)
Called Mary Dougherty at Home/Mobile number.  Messge left requesting a return call for chemotherapy follow up.  Awaiting return call from patient.  Noted she was here today for IVF so encouraged to drink lots of water.

## 2013-01-02 NOTE — Telephone Encounter (Signed)
Pt called states " I got taxol on Friday and I passed out this morning." pt complained of increased fatigue, her parents are going to pick her up to take her home. Reviewed with NO, pt to come into [facility] today for labs and office visit. POF Sent

## 2013-01-02 NOTE — Progress Notes (Signed)
[facility]  Telephone:(336) [phone] Fax:(336) [phone]  OFFICE PROGRESS NOTE    ID: Mary Dougherty   DOB: Nov 04, 1970  MR#: [id number]  ZHY#:[id number]   PCP: Lucilla Edin, MD GI: Claudette Head, M.D.   DIAGNOSIS:  Mary Dougherty is a 42 y.o. female diagnosed in 09/2012 with invasive ductal carcinoma of the right breast that is estrogen receptor positive, progesterone receptor positive, HER-2/neu negative.   PRIOR THERAPY: 1.  Patient went for her initial screening mammogram at the insistence of her sister. She was found to have a mass in the right breast. The ultrasound demonstrated a 1.4 cm area of concern. She had an MRI performed that revealed a 2.7 cm irregular area at the 6 o'clock location. There were no masses in the left breast no abnormal lymph nodes. Patient had a biopsy performed of the right breast that revealed an intermediate to high-grade invasive ductal carcinoma the tumor was estrogen receptor positive 99%, progesterone receptor positive 100%,  HER-2/neu negative with a Ki-67 of 70%.   2. received neoadjuvant chemotherapy with dose dense FEC (5-FU/epirubicin/Cytoxan) from 10/07/2012 through 12/16/12.  She started weekly Taxol on 12/30/12.    3.  Acute on chronic anemia.   CURRENT THERAPY: Weekly Taxol  INTERVAL HISTORY: Mary Dougherty is a  42 y.o. female who returns today for an urgent visit due to passing out at work.  She has not been feeling well all day, and then a couple of hours ago she became dizzy, and increasingly fatigued.  She sat down near a filing cabinet and remembers waking up shortly thereafter.  She did feel her heart pounding before she passed out.  She has been drinking fluids/eating and denies any bloody stool/melena.  Otherwise, a 10 point ROS is neg.   MEDICAL HISTORY: Past Medical History  Diagnosis Date  . Anemia   . Thyroid disease   . Wears glasses   . Breast cancer 2014    ER+/PR+/Her2-  . Spherocytosis  1989    ALLERGIES:   No Known Allergies   MEDICATIONS:  Current Outpatient Prescriptions  Medication Sig Dispense Refill  . Cholecalciferol (VITAMIN D3) 1000 UNITS CAPS Take 1 capsule by mouth daily.      Marland Kitchen dexamethasone (DECADRON) 4 MG tablet Take 4 mg by mouth 2 (two) times daily with a meal.      . docusate sodium (COLACE) 100 MG capsule Take 100 mg by mouth 3 (three) times daily as needed.       . folic acid (FOLVITE) 400 MCG tablet Take 400 mcg by mouth 2 (two) times daily.       Marland Kitchen lidocaine-prilocaine (EMLA) cream Apply topically as needed.  30 g  6  . Prenatal Vit-Fe Fumarate-FA (PRENATAL MULTIVITAMIN) TABS tablet Take 1 tablet by mouth daily at 12 noon.      . valACYclovir (VALTREX) 500 MG tablet Take 500 mg by mouth daily.       Marland Kitchen LORazepam (ATIVAN) 0.5 MG tablet Take 1 tablet (0.5 mg total) by mouth every 6 (six) hours as needed (Nausea or vomiting).  30 tablet  0  . ondansetron (ZOFRAN ODT) 4 MG disintegrating tablet 4mg  ODT q4 hours prn nausea/vomit  4 tablet  0  . oxyCODONE (OXY IR/ROXICODONE) 5 MG immediate release tablet Take 1 tablet (5 mg total) by mouth every 4 (four) hours as needed for pain.  30 tablet  0   No current facility-administered medications for this visit.    SURGICAL  HISTORY:  Past Surgical History  Procedure Laterality Date  . Wisdom tooth extraction    . Portacath placement Left 09/20/2012    Procedure: INSERTION PORT-A-CATH;  Surgeon: Emelia Loron, MD;  Location: [facility];  Service: General;  Laterality: Left;    REVIEW OF SYSTEMS:  A 10 point review of systems was completed and is negative except as stated above.    PHYSICAL EXAMINATION: Blood pressure 106/68, pulse 84, temperature 98 F (36.7 C), temperature source Oral, resp. rate 18, height 5\' 5"  (1.651 m), weight 197 lb 1.6 oz (89.404 kg). Body mass index is 32.8 kg/(m^2). General: Patient is a well appearing female in no acute distress HEENT: PERRLA, sclerae  anicteric no conjunctival pallor, MMM Neck: supple, no palpable adenopathy Lungs: clear to auscultation bilaterally, no wheezes, rhonchi, or rales Cardiovascular: regular rate rhythm, S1, S2, no murmurs, rubs or gallops Abdomen: Soft, non-tender, non-distended, normoactive bowel sounds, no HSM Extremities: warm and well perfused, no clubbing, cyanosis, or edema Skin: No rashes or lesions Neuro: Non-focal Breasts: right breast nodule at 6 is softer,a bout 1-2cm ECOG FS: 1 - Symptomatic but completely ambulatory  LABORATORY DATA: Lab Results  Component Value Date   WBC 5.5 01/02/2013   HGB 8.6* 01/02/2013   HCT 25.8* 01/02/2013   MCV 83.2 01/02/2013   PLT 180 01/02/2013      Chemistry      Component Value Date/Time   NA 141 01/02/2013 1221   NA 136 10/26/2012 0918   K 4.2 01/02/2013 1221   K 3.3* 10/26/2012 0918   CL 101 10/26/2012 0918   CO2 26 01/02/2013 1221   CO2 27 10/26/2012 0918   BUN 12.8 01/02/2013 1221   BUN 15 10/26/2012 0918   CREATININE 0.7 01/02/2013 1221   CREATININE 0.60 10/26/2012 0918      Component Value Date/Time   CALCIUM 8.9 01/02/2013 1221   CALCIUM 8.4 10/26/2012 0918   ALKPHOS 70 01/02/2013 1221   ALKPHOS 118* 10/26/2012 0918   AST 20 01/02/2013 1221   AST 12 10/26/2012 0918   ALT 24 01/02/2013 1221   ALT 18 10/26/2012 0918   BILITOT 1.08 01/02/2013 1221   BILITOT 0.6 10/26/2012 0918       RADIOGRAPHIC STUDIES: No results found.   ASSESSMENT:   Mary Dougherty is a 42 y.o. female with:   #1 First ever mammogram found an abnormal mass in the right breast.  Ultrasound confirmed the mass to be 1.4 cm at the 6 o'clock position.   MRI revealed the mass to be a little bit bigger at 2.7 cm. Biopsy showed an invasive ductal carcinoma, grade 2, estrogen receptor/progesterone receptor positive, HER-2/neu negative, with a Ki-67 of 70%.   #2  Patient desires breast conservation and started receiving neoadjuvant chemotherapy consisting of Q2 week dose dense FEC for a  total of 6 cycles.  This will be followed by Taxol weekly for a total of 12 weeks. Risks benefits and side effects of this treatment were discussed with the patient.   #3  After definitive surgery, the plan is for the patient to receive radiation therapy adjuvantly under the direction of Dr. Basilio Cairo.  #4  Patient will need antiestrogen therapy after radiation completion because her tumor is estrogen receptor positive.  Since she has not been without a menses for one year, therefore our plan is to proceed with antiestrogen therapy of Tamoxifen for 10 years.   #5 Acute on chronic anemia and GI bleeding - patient completed  blood transfusion of 2 units of PRBCs on 12/09/2012 and consult with gastroenterologist Claudette Head, M.D on 12/12/2012 .  PLAN:  #1 Ms. Mccauley passed out today.  She is not as orthostatic as expected.  The fact that she felt like her heart was racing, or pounding prior is concerning.  We will refer her to cardiology.    #2 Her hemoglobin is 8.6, which is improved today.    #3 She will receive IV fluids today in the treatment room.    #4 She will return next week for labs and evaluation prior to Taxol.     All questions answered. Ms. Ivy and her mother were encouraged to contact us in the interim with any questions, concerns, or problems.   I spent 25 minutes counseling the patient face to face.  The total time spent in the appointment was 30 minutes.  This case was discussed with Dr. Welton Flakes.   Illa Level, NP Medical Oncology [facility] [phone] 01/03/2013    1:16 PM

## 2013-01-02 NOTE — Telephone Encounter (Signed)
Referral made for pt to be seen at [facility] for sycope. Fax last progress note to [place]. Pt appt 01/17/13 at 10:30. Notified pt

## 2013-01-02 NOTE — Telephone Encounter (Signed)
Message copied by Augusto Garbe on Mon Jan 02, 2013  5:24 PM ------      Message from: Orbie Hurst      Created: Fri Dec 30, 2012 11:31 AM      Regarding: chemo follow up call       1st taxol.  Dr. Welton Flakes  [phone] ------

## 2013-01-03 ENCOUNTER — Telehealth: Payer: Self-pay | Admitting: *Deleted

## 2013-01-03 NOTE — Telephone Encounter (Signed)
Per NP request. Patient received IV fluids yesterday. Called patient to see how she was feeling today. Patient said she still feels dizzy upon standing, but doesn't feel weak or like she may faint. Patient thanked [place] for checking on her. Advised her to rest. She had no further concerns or questions. Message forwarded to Larna Daughters, NP.

## 2013-01-06 ENCOUNTER — Encounter: Payer: Self-pay | Admitting: Adult Health

## 2013-01-06 ENCOUNTER — Ambulatory Visit (HOSPITAL_BASED_OUTPATIENT_CLINIC_OR_DEPARTMENT_OTHER): Payer: BC Managed Care – PPO | Admitting: Adult Health

## 2013-01-06 ENCOUNTER — Encounter: Payer: Self-pay | Admitting: Oncology

## 2013-01-06 ENCOUNTER — Ambulatory Visit (HOSPITAL_BASED_OUTPATIENT_CLINIC_OR_DEPARTMENT_OTHER): Payer: BC Managed Care – PPO

## 2013-01-06 ENCOUNTER — Other Ambulatory Visit: Payer: BC Managed Care – PPO | Admitting: Lab

## 2013-01-06 VITALS — BP 123/71 | HR 83 | Temp 98.4°F | Resp 18 | Ht 65.0 in | Wt 197.0 lb

## 2013-01-06 DIAGNOSIS — C50511 Malignant neoplasm of lower-outer quadrant of right female breast: Secondary | ICD-10-CM

## 2013-01-06 DIAGNOSIS — C50919 Malignant neoplasm of unspecified site of unspecified female breast: Secondary | ICD-10-CM

## 2013-01-06 DIAGNOSIS — Z5111 Encounter for antineoplastic chemotherapy: Secondary | ICD-10-CM

## 2013-01-06 DIAGNOSIS — R42 Dizziness and giddiness: Secondary | ICD-10-CM

## 2013-01-06 DIAGNOSIS — D638 Anemia in other chronic diseases classified elsewhere: Secondary | ICD-10-CM

## 2013-01-06 DIAGNOSIS — K922 Gastrointestinal hemorrhage, unspecified: Secondary | ICD-10-CM

## 2013-01-06 LAB — CBC WITH DIFFERENTIAL/PLATELET
BASO%: 0.5 % (ref 0.0–2.0)
EOS%: 0.6 % (ref 0.0–7.0)
Eosinophils Absolute: 0.1 10*3/uL (ref 0.0–0.5)
HCT: 24.5 % — ABNORMAL LOW (ref 34.8–46.6)
LYMPH%: 13 % — ABNORMAL LOW (ref 14.0–49.7)
MCH: 27.6 pg (ref 25.1–34.0)
MCHC: 33.1 g/dL (ref 31.5–36.0)
MCV: 83.3 fL (ref 79.5–101.0)
MONO#: 0.5 10*3/uL (ref 0.1–0.9)
MONO%: 6 % (ref 0.0–14.0)
NEUT#: 6.5 10*3/uL (ref 1.5–6.5)
NEUT%: 79.9 % — ABNORMAL HIGH (ref 38.4–76.8)
Platelets: 257 10*3/uL (ref 145–400)
RBC: 2.94 10*6/uL — ABNORMAL LOW (ref 3.70–5.45)
RDW: 20.2 % — ABNORMAL HIGH (ref 11.2–14.5)

## 2013-01-06 LAB — COMPREHENSIVE METABOLIC PANEL (CC13)
ALT: 25 U/L (ref 0–55)
Albumin: 3.6 g/dL (ref 3.5–5.0)
Alkaline Phosphatase: 73 U/L (ref 40–150)
Anion Gap: 11 mEq/L (ref 3–11)
CO2: 24 mEq/L (ref 22–29)
Creatinine: 0.7 mg/dL (ref 0.6–1.1)
Sodium: 142 mEq/L (ref 136–145)
Total Bilirubin: 0.75 mg/dL (ref 0.20–1.20)
Total Protein: 6.3 g/dL — ABNORMAL LOW (ref 6.4–8.3)

## 2013-01-06 MED ORDER — DEXAMETHASONE SODIUM PHOSPHATE 20 MG/5ML IJ SOLN
INTRAMUSCULAR | Status: AC
  Filled 2013-01-06: qty 5

## 2013-01-06 MED ORDER — ONDANSETRON 8 MG/50ML IVPB (CHCC)
8.0000 mg | Freq: Once | INTRAVENOUS | Status: AC
  Administered 2013-01-06: 8 mg via INTRAVENOUS

## 2013-01-06 MED ORDER — DIPHENHYDRAMINE HCL 50 MG/ML IJ SOLN
INTRAMUSCULAR | Status: AC
  Filled 2013-01-06: qty 1

## 2013-01-06 MED ORDER — SODIUM CHLORIDE 0.9 % IV SOLN
Freq: Once | INTRAVENOUS | Status: AC
  Administered 2013-01-06: 11:00:00 via INTRAVENOUS

## 2013-01-06 MED ORDER — DEXAMETHASONE SODIUM PHOSPHATE 20 MG/5ML IJ SOLN
20.0000 mg | Freq: Once | INTRAMUSCULAR | Status: AC
  Administered 2013-01-06: 20 mg via INTRAVENOUS

## 2013-01-06 MED ORDER — FAMOTIDINE IN NACL 20-0.9 MG/50ML-% IV SOLN
20.0000 mg | Freq: Once | INTRAVENOUS | Status: AC
  Administered 2013-01-06: 20 mg via INTRAVENOUS

## 2013-01-06 MED ORDER — SODIUM CHLORIDE 0.9 % IJ SOLN
10.0000 mL | INTRAMUSCULAR | Status: DC | PRN
  Administered 2013-01-06: 10 mL
  Filled 2013-01-06: qty 10

## 2013-01-06 MED ORDER — ONDANSETRON 8 MG/NS 50 ML IVPB
INTRAVENOUS | Status: AC
  Filled 2013-01-06: qty 8

## 2013-01-06 MED ORDER — DIPHENHYDRAMINE HCL 50 MG/ML IJ SOLN
50.0000 mg | Freq: Once | INTRAMUSCULAR | Status: AC
  Administered 2013-01-06: 50 mg via INTRAVENOUS

## 2013-01-06 MED ORDER — FAMOTIDINE IN NACL 20-0.9 MG/50ML-% IV SOLN
INTRAVENOUS | Status: AC
  Filled 2013-01-06: qty 50

## 2013-01-06 MED ORDER — SODIUM CHLORIDE 0.9 % IV SOLN
80.0000 mg/m2 | Freq: Once | INTRAVENOUS | Status: AC
  Administered 2013-01-06: 162 mg via INTRAVENOUS
  Filled 2013-01-06: qty 27

## 2013-01-06 MED ORDER — HEPARIN SOD (PORK) LOCK FLUSH 100 UNIT/ML IV SOLN
500.0000 [IU] | Freq: Once | INTRAVENOUS | Status: AC | PRN
  Administered 2013-01-06: 500 [IU]
  Filled 2013-01-06: qty 5

## 2013-01-06 NOTE — Patient Instructions (Signed)
 Cancer Center Discharge Instructions for Patients Receiving Chemotherapy  Today you received the following chemotherapy agent: Taxol  To help prevent nausea and vomiting after your treatment, we encourage you to take your nausea medications as needed:  Zofran 4 mg ODT every 4 hours as needed  Ativan 0.5 mg every 6 hours as needed If you develop nausea and vomiting that is not controlled by your nausea medication, call the clinic.   BELOW ARE SYMPTOMS THAT SHOULD BE REPORTED IMMEDIATELY:  *FEVER GREATER THAN 100.5 F  *CHILLS WITH OR WITHOUT FEVER  NAUSEA AND VOMITING THAT IS NOT CONTROLLED WITH YOUR NAUSEA MEDICATION  *UNUSUAL SHORTNESS OF BREATH  *UNUSUAL BRUISING OR BLEEDING  TENDERNESS IN MOUTH AND THROAT WITH OR WITHOUT PRESENCE OF ULCERS  *URINARY PROBLEMS  *BOWEL PROBLEMS  UNUSUAL RASH Items with * indicate a potential emergency and should be followed up as soon as possible.  Feel free to call the clinic should you have any questions or concerns. The clinic phone number is [phone].  It has been a pleasure to serve you today 1

## 2013-01-06 NOTE — Progress Notes (Addendum)
[facility]  Telephone:(336) [phone] Fax:(336) [phone]  OFFICE PROGRESS NOTE    ID: Mary Dougherty   DOB: 1970-04-11  MR#: [id number]  YQI#:[id number]   PCP: Lucilla Edin, MD GI: Claudette Head, M.D.   DIAGNOSIS:  Mary Dougherty is a 42 y.o. female diagnosed in 09/2012 with invasive ductal carcinoma of the right breast that is estrogen receptor positive, progesterone receptor positive, HER-2/neu negative.   PRIOR THERAPY: 1.  Patient went for her initial screening mammogram at the insistence of her sister. She was found to have a mass in the right breast. The ultrasound demonstrated a 1.4 cm area of concern. She had an MRI performed that revealed a 2.7 cm irregular area at the 6 o'clock location. There were no masses in the left breast no abnormal lymph nodes. Patient had a biopsy performed of the right breast that revealed an intermediate to high-grade invasive ductal carcinoma the tumor was estrogen receptor positive 99%, progesterone receptor positive 100%,  HER-2/neu negative with a Ki-67 of 70%.   2. received neoadjuvant chemotherapy with dose dense FEC (5-FU/epirubicin/Cytoxan) from 10/07/2012 through 12/16/12.  She started weekly Taxol on 12/30/12.    3.  Acute on chronic anemia.   CURRENT THERAPY: Weekly Taxol  INTERVAL HISTORY: Mary Dougherty is a  42 y.o. female who returns today for her weekly Taxol.  She is feeling better than on Monday when she passed out at work.  She still has some slight dizziness with position changes.  She says the fluids did help.  Her hemoglobin is 8.1.  Otherwise, she denies fevers, chills, nausea, vomiting, constipation, diarrhea, numbness or any other concerns.   MEDICAL HISTORY: Past Medical History  Diagnosis Date  . Anemia   . Thyroid disease   . Wears glasses   . Breast cancer 2014    ER+/PR+/Her2-  . Spherocytosis 1989    ALLERGIES:   No Known Allergies   MEDICATIONS:  Current Outpatient  Prescriptions  Medication Sig Dispense Refill  . Cholecalciferol (VITAMIN D3) 1000 UNITS CAPS Take 1 capsule by mouth daily.      Marland Kitchen dexamethasone (DECADRON) 4 MG tablet Take 4 mg by mouth 2 (two) times daily with a meal.      . docusate sodium (COLACE) 100 MG capsule Take 100 mg by mouth 3 (three) times daily as needed.       . folic acid (FOLVITE) 400 MCG tablet Take 400 mcg by mouth 2 (two) times daily.       Marland Kitchen lidocaine-prilocaine (EMLA) cream Apply topically as needed.  30 g  6  . Prenatal Vit-Fe Fumarate-FA (PRENATAL MULTIVITAMIN) TABS tablet Take 1 tablet by mouth daily at 12 noon.      . valACYclovir (VALTREX) 500 MG tablet Take 500 mg by mouth daily.       Marland Kitchen LORazepam (ATIVAN) 0.5 MG tablet Take 1 tablet (0.5 mg total) by mouth every 6 (six) hours as needed (Nausea or vomiting).  30 tablet  0  . ondansetron (ZOFRAN ODT) 4 MG disintegrating tablet 4mg  ODT q4 hours prn nausea/vomit  4 tablet  0  . oxyCODONE (OXY IR/ROXICODONE) 5 MG immediate release tablet Take 1 tablet (5 mg total) by mouth every 4 (four) hours as needed for pain.  30 tablet  0   No current facility-administered medications for this visit.    SURGICAL HISTORY:  Past Surgical History  Procedure Laterality Date  . Wisdom tooth extraction    . Portacath placement Left  09/20/2012    Procedure: INSERTION PORT-A-CATH;  Surgeon: Emelia Loron, MD;  Location: [facility];  Service: General;  Laterality: Left;    REVIEW OF SYSTEMS:  A 10 point review of systems was completed and is negative except as stated above.    PHYSICAL EXAMINATION: Blood pressure 123/71, pulse 83, temperature 98.4 F (36.9 C), temperature source Oral, resp. rate 18, height 5\' 5"  (1.651 m), weight 197 lb (89.359 kg). Body mass index is 32.78 kg/(m^2). General: Patient is a well appearing female in no acute distress HEENT: PERRLA, sclerae anicteric no conjunctival pallor, MMM Neck: supple, no palpable adenopathy Lungs: clear to  auscultation bilaterally, no wheezes, rhonchi, or rales Cardiovascular: regular rate rhythm, S1, S2, no murmurs, rubs or gallops Abdomen: Soft, non-tender, non-distended, normoactive bowel sounds, no HSM Extremities: warm and well perfused, no clubbing, cyanosis, or edema Skin: No rashes or lesions Neuro: Non-focal Breasts: right breast nodule at 6 is softer,a bout 1-2cm ECOG FS: 1 - Symptomatic but completely ambulatory  LABORATORY DATA: Lab Results  Component Value Date   WBC 5.5 01/02/2013   HGB 8.6* 01/02/2013   HCT 25.8* 01/02/2013   MCV 83.2 01/02/2013   PLT 180 01/02/2013      Chemistry      Component Value Date/Time   NA 141 01/02/2013 1221   NA 136 10/26/2012 0918   K 4.2 01/02/2013 1221   K 3.3* 10/26/2012 0918   CL 101 10/26/2012 0918   CO2 26 01/02/2013 1221   CO2 27 10/26/2012 0918   BUN 12.8 01/02/2013 1221   BUN 15 10/26/2012 0918   CREATININE 0.7 01/02/2013 1221   CREATININE 0.60 10/26/2012 0918      Component Value Date/Time   CALCIUM 8.9 01/02/2013 1221   CALCIUM 8.4 10/26/2012 0918   ALKPHOS 70 01/02/2013 1221   ALKPHOS 118* 10/26/2012 0918   AST 20 01/02/2013 1221   AST 12 10/26/2012 0918   ALT 24 01/02/2013 1221   ALT 18 10/26/2012 0918   BILITOT 1.08 01/02/2013 1221   BILITOT 0.6 10/26/2012 0918       RADIOGRAPHIC STUDIES: No results found.   ASSESSMENT:   Mary Dougherty is a 42 y.o. female with:   #1 First ever mammogram found an abnormal mass in the right breast.  Ultrasound confirmed the mass to be 1.4 cm at the 6 o'clock position.   MRI revealed the mass to be a little bit bigger at 2.7 cm. Biopsy showed an invasive ductal carcinoma, grade 2, estrogen receptor/progesterone receptor positive, HER-2/neu negative, with a Ki-67 of 70%.   #2  Patient desires breast conservation and started receiving neoadjuvant chemotherapy consisting of Q2 week dose dense FEC for a total of 6 cycles.  This will be followed by Taxol weekly for a total of 12 weeks. Risks  benefits and side effects of this treatment were discussed with the patient.   #3  After definitive surgery, the plan is for the patient to receive radiation therapy adjuvantly under the direction of Dr. Basilio Cairo.  #4  Patient will need antiestrogen therapy after radiation completion because her tumor is estrogen receptor positive.  Since she has not been without a menses for one year, therefore our plan is to proceed with antiestrogen therapy of Tamoxifen for 10 years.   #5 Acute on chronic anemia and GI bleeding - patient completed blood transfusion of 2 units of PRBCs on 12/09/2012 and consult with gastroenterologist Claudette Head, M.D on 12/12/2012 .  PLAN:  #1  Mary Dougherty is improved from Monday.  She is feeling better.  Her labs are stable, she will proceed with Taxol.    #2 Her hemoglobin is 8.1, I reviewed the patient and her labs and symptoms with Dr. Welton Flakes, we will continue to observe her, she will not receive any transfusions.    #3  Mary Dougherty will return next week for her next week of Taxol.     All questions answered. Mary Dougherty were encouraged to contact us in the interim with any questions, concerns, or problems.   I spent 25 minutes counseling the patient face to face.  The total time spent in the appointment was 30 minutes.  This case was discussed with Dr. Welton Flakes.   Illa Level, NP Medical Oncology [facility] [phone] 01/06/2013    9:26 AM  ATTENDING'S ATTESTATION:  I personally reviewed patient's chart, examined patient myself, formulated the treatment plan as followed.    Patient is here for her weekly Taxol. She's been tolerating it well. She's had some ongoing issues with dizziness. Her hemoglobin is 8.1. She thinks her blood pressures have been running low. She is trying to drink more fluids as much as she possibly can. Today she will proceed with her treatment  Drue Second, MD Medical/Oncology [facility] [phone] (beeper) [phone] (Office)  01/09/2013, 12:59 AM

## 2013-01-13 ENCOUNTER — Ambulatory Visit (HOSPITAL_BASED_OUTPATIENT_CLINIC_OR_DEPARTMENT_OTHER): Payer: BC Managed Care – PPO

## 2013-01-13 ENCOUNTER — Telehealth: Payer: Self-pay | Admitting: *Deleted

## 2013-01-13 ENCOUNTER — Encounter: Payer: Self-pay | Admitting: Adult Health

## 2013-01-13 ENCOUNTER — Ambulatory Visit (HOSPITAL_BASED_OUTPATIENT_CLINIC_OR_DEPARTMENT_OTHER): Payer: BC Managed Care – PPO | Admitting: Adult Health

## 2013-01-13 ENCOUNTER — Encounter: Payer: Self-pay | Admitting: Oncology

## 2013-01-13 ENCOUNTER — Other Ambulatory Visit (HOSPITAL_BASED_OUTPATIENT_CLINIC_OR_DEPARTMENT_OTHER): Payer: BC Managed Care – PPO

## 2013-01-13 ENCOUNTER — Encounter (HOSPITAL_COMMUNITY)
Admission: RE | Admit: 2013-01-13 | Discharge: 2013-01-13 | Disposition: A | Discharge status: Home / Self Care | Payer: BC Managed Care – PPO | Source: Ambulatory Visit | Attending: Oncology | Admitting: Oncology

## 2013-01-13 VITALS — BP 110/68 | HR 89 | Temp 98.9°F | Resp 18 | Ht 65.0 in | Wt 197.0 lb

## 2013-01-13 DIAGNOSIS — C50511 Malignant neoplasm of lower-outer quadrant of right female breast: Secondary | ICD-10-CM

## 2013-01-13 DIAGNOSIS — C50519 Malignant neoplasm of lower-outer quadrant of unspecified female breast: Secondary | ICD-10-CM

## 2013-01-13 DIAGNOSIS — C50919 Malignant neoplasm of unspecified site of unspecified female breast: Secondary | ICD-10-CM

## 2013-01-13 DIAGNOSIS — Z5111 Encounter for antineoplastic chemotherapy: Secondary | ICD-10-CM

## 2013-01-13 DIAGNOSIS — D649 Anemia, unspecified: Secondary | ICD-10-CM | POA: Insufficient documentation

## 2013-01-13 LAB — COMPREHENSIVE METABOLIC PANEL (CC13)
Albumin: 3.6 g/dL (ref 3.5–5.0)
Anion Gap: 9 mEq/L (ref 3–11)
BUN: 5.1 mg/dL — ABNORMAL LOW (ref 7.0–26.0)
CO2: 25 mEq/L (ref 22–29)
Calcium: 9.1 mg/dL (ref 8.4–10.4)
Chloride: 110 mEq/L — ABNORMAL HIGH (ref 98–109)
Creatinine: 0.7 mg/dL (ref 0.6–1.1)
Glucose: 122 mg/dl (ref 70–140)
Potassium: 3.9 mEq/L (ref 3.5–5.1)

## 2013-01-13 LAB — CBC WITH DIFFERENTIAL/PLATELET
Basophils Absolute: 0 10*3/uL (ref 0.0–0.1)
HCT: 23.5 % — ABNORMAL LOW (ref 34.8–46.6)
HGB: 7.6 g/dL — ABNORMAL LOW (ref 11.6–15.9)
LYMPH%: 20.1 % (ref 14.0–49.7)
MCH: 27.4 pg (ref 25.1–34.0)
MCHC: 32.3 g/dL (ref 31.5–36.0)
MCV: 84.8 fL (ref 79.5–101.0)
MONO#: 0.4 10*3/uL (ref 0.1–0.9)
MONO%: 10.9 % (ref 0.0–14.0)
NEUT%: 66.4 % (ref 38.4–76.8)
Platelets: 209 10*3/uL (ref 145–400)
RBC: 2.77 10*6/uL — ABNORMAL LOW (ref 3.70–5.45)
WBC: 3.8 10*3/uL — ABNORMAL LOW (ref 3.9–10.3)
lymph#: 0.8 10*3/uL — ABNORMAL LOW (ref 0.9–3.3)
nRBC: 3 % — ABNORMAL HIGH (ref 0–0)

## 2013-01-13 MED ORDER — ONDANSETRON 8 MG/NS 50 ML IVPB
INTRAVENOUS | Status: AC
  Filled 2013-01-13: qty 8

## 2013-01-13 MED ORDER — SODIUM CHLORIDE 0.9 % IJ SOLN
10.0000 mL | INTRAMUSCULAR | Status: DC | PRN
  Administered 2013-01-13: 10 mL
  Filled 2013-01-13: qty 10

## 2013-01-13 MED ORDER — SODIUM CHLORIDE 0.9 % IV SOLN
Freq: Once | INTRAVENOUS | Status: AC
  Administered 2013-01-13: 10:00:00 via INTRAVENOUS

## 2013-01-13 MED ORDER — ONDANSETRON 16 MG/50ML IVPB (CHCC)
INTRAVENOUS | Status: AC
  Filled 2013-01-13: qty 16

## 2013-01-13 MED ORDER — HEPARIN SOD (PORK) LOCK FLUSH 100 UNIT/ML IV SOLN
500.0000 [IU] | Freq: Once | INTRAVENOUS | Status: AC | PRN
  Administered 2013-01-13: 500 [IU]
  Filled 2013-01-13: qty 5

## 2013-01-13 MED ORDER — DIPHENHYDRAMINE HCL 50 MG/ML IJ SOLN
INTRAMUSCULAR | Status: AC
  Filled 2013-01-13: qty 1

## 2013-01-13 MED ORDER — DEXAMETHASONE SODIUM PHOSPHATE 20 MG/5ML IJ SOLN
INTRAMUSCULAR | Status: AC
  Filled 2013-01-13: qty 5

## 2013-01-13 MED ORDER — FAMOTIDINE IN NACL 20-0.9 MG/50ML-% IV SOLN
INTRAVENOUS | Status: AC
  Filled 2013-01-13: qty 50

## 2013-01-13 MED ORDER — SODIUM CHLORIDE 0.9 % IV SOLN
80.0000 mg/m2 | Freq: Once | INTRAVENOUS | Status: AC
  Administered 2013-01-13: 162 mg via INTRAVENOUS
  Filled 2013-01-13: qty 27

## 2013-01-13 MED ORDER — FAMOTIDINE IN NACL 20-0.9 MG/50ML-% IV SOLN
20.0000 mg | Freq: Once | INTRAVENOUS | Status: AC
  Administered 2013-01-13: 20 mg via INTRAVENOUS

## 2013-01-13 MED ORDER — DEXAMETHASONE SODIUM PHOSPHATE 20 MG/5ML IJ SOLN
20.0000 mg | Freq: Once | INTRAMUSCULAR | Status: AC
  Administered 2013-01-13: 20 mg via INTRAVENOUS

## 2013-01-13 MED ORDER — ONDANSETRON 8 MG/50ML IVPB (CHCC)
8.0000 mg | Freq: Once | INTRAVENOUS | Status: AC
  Administered 2013-01-13: 8 mg via INTRAVENOUS

## 2013-01-13 MED ORDER — DIPHENHYDRAMINE HCL 50 MG/ML IJ SOLN
50.0000 mg | Freq: Once | INTRAMUSCULAR | Status: AC
  Administered 2013-01-13: 50 mg via INTRAVENOUS

## 2013-01-13 NOTE — Progress Notes (Addendum)
[facility]  Telephone:(336) [phone] Fax:(336) [phone]  OFFICE PROGRESS NOTE    ID: Mary Dougherty   DOB: [birth year]  MR#: [id number]  AOZ#:[id number]   PCP: Lucilla Edin, MD GI: Claudette Head, M.D.   DIAGNOSIS:  Mary Dougherty is a 42 y.o. female diagnosed in 09/2012 with invasive ductal carcinoma of the right breast that is estrogen receptor positive, progesterone receptor positive, HER-2/neu negative.   PRIOR THERAPY: 1.  Patient went for her initial screening mammogram at the insistence of her sister. She was found to have a mass in the right breast. The ultrasound demonstrated a 1.4 cm area of concern. She had an MRI performed that revealed a 2.7 cm irregular area at the 6 o'clock location. There were no masses in the left breast no abnormal lymph nodes. Patient had a biopsy performed of the right breast that revealed an intermediate to high-grade invasive ductal carcinoma the tumor was estrogen receptor positive 99%, progesterone receptor positive 100%,  HER-2/neu negative with a Ki-67 of 70%.   2. received neoadjuvant chemotherapy with dose dense FEC (5-FU/epirubicin/Cytoxan) from 10/07/2012 through 12/16/12.  She started weekly Taxol on 12/30/12.    3.  Acute on chronic anemia.   CURRENT THERAPY: Weekly Taxol  INTERVAL HISTORY: Mary Dougherty is a  42 y.o. female who returns for evaluation prior to weekly Taxol.  She is doing well today.  She is anemic and does get fatigued by the end of the day.  She has started taking OTC Zyrtec for clear nasal drainage that she has had this week.  It has improved since then. She denies fevers, chills, nausea, vomiting, constipation, diarrhea, numbness, or any further concerns.    MEDICAL HISTORY: Past Medical History  Diagnosis Date  . Anemia   . Thyroid disease   . Wears glasses   . Breast cancer 2014    ER+/PR+/Her2-  . Spherocytosis 1989    ALLERGIES:   No Known Allergies   MEDICATIONS:   Current Outpatient Prescriptions  Medication Sig Dispense Refill  . cetirizine (ZYRTEC) 10 MG tablet Take 10 mg by mouth daily.      . Cholecalciferol (VITAMIN D3) 1000 UNITS CAPS Take 1 capsule by mouth daily.      Marland Kitchen docusate sodium (COLACE) 100 MG capsule Take 100 mg by mouth 3 (three) times daily as needed.       . folic acid (FOLVITE) 400 MCG tablet Take 400 mcg by mouth 2 (two) times daily.       Marland Kitchen lidocaine-prilocaine (EMLA) cream Apply topically as needed.  30 g  6  . Prenatal Vit-Fe Fumarate-FA (PRENATAL MULTIVITAMIN) TABS tablet Take 1 tablet by mouth daily at 12 noon.      . valACYclovir (VALTREX) 500 MG tablet Take 500 mg by mouth daily.       Marland Kitchen dexamethasone (DECADRON) 4 MG tablet Take 4 mg by mouth 2 (two) times daily with a meal.      . LORazepam (ATIVAN) 0.5 MG tablet Take 1 tablet (0.5 mg total) by mouth every 6 (six) hours as needed (Nausea or vomiting).  30 tablet  0  . ondansetron (ZOFRAN ODT) 4 MG disintegrating tablet 4mg  ODT q4 hours prn nausea/vomit  4 tablet  0  . oxyCODONE (OXY IR/ROXICODONE) 5 MG immediate release tablet Take 1 tablet (5 mg total) by mouth every 4 (four) hours as needed for pain.  30 tablet  0   No current facility-administered medications for this visit.  SURGICAL HISTORY:  Past Surgical History  Procedure Laterality Date  . Wisdom tooth extraction    . Portacath placement Left 09/20/2012    Procedure: INSERTION PORT-A-CATH;  Surgeon: Emelia Loron, MD;  Location: [facility];  Service: General;  Laterality: Left;    REVIEW OF SYSTEMS:  A 10 point review of systems was completed and is negative except as stated above.    PHYSICAL EXAMINATION: Blood pressure 110/68, pulse 89, temperature 98.9 F (37.2 C), temperature source Oral, resp. rate 18, height 5\' 5"  (1.651 m), weight 197 lb (89.359 kg). Body mass index is 32.78 kg/(m^2). General: Patient is a well appearing female in no acute distress HEENT: PERRLA, sclerae  anicteric no conjunctival pallor, MMM Neck: supple, no palpable adenopathy Lungs: clear to auscultation bilaterally, no wheezes, rhonchi, or rales Cardiovascular: regular rate rhythm, S1, S2, no murmurs, rubs or gallops Abdomen: Soft, non-tender, non-distended, normoactive bowel sounds, no HSM Extremities: warm and well perfused, no clubbing, cyanosis, or edema Skin: No rashes or lesions Neuro: Non-focal Breasts: right breast nodule at 6 is softer,a bout 1-2cm ECOG FS: 1 - Symptomatic but completely ambulatory  LABORATORY DATA: Lab Results  Component Value Date   WBC 3.8* 01/13/2013   HGB 7.6* 01/13/2013   HCT 23.5* 01/13/2013   MCV 84.8 01/13/2013   PLT 209 01/13/2013      Chemistry      Component Value Date/Time   NA 143 01/13/2013 0852   NA 136 10/26/2012 0918   K 3.9 01/13/2013 0852   K 3.3* 10/26/2012 0918   CL 101 10/26/2012 0918   CO2 25 01/13/2013 0852   CO2 27 10/26/2012 0918   BUN 5.1* 01/13/2013 0852   BUN 15 10/26/2012 0918   CREATININE 0.7 01/13/2013 0852   CREATININE 0.60 10/26/2012 0918      Component Value Date/Time   CALCIUM 9.1 01/13/2013 0852   CALCIUM 8.4 10/26/2012 0918   ALKPHOS 70 01/13/2013 0852   ALKPHOS 118* 10/26/2012 0918   AST 21 01/13/2013 0852   AST 12 10/26/2012 0918   ALT 25 01/13/2013 0852   ALT 18 10/26/2012 0918   BILITOT 0.95 01/13/2013 0852   BILITOT 0.6 10/26/2012 0918       RADIOGRAPHIC STUDIES: No results found.   ASSESSMENT:   Mary Dougherty is a 42 y.o. female with:   #1 First ever mammogram found an abnormal mass in the right breast.  Ultrasound confirmed the mass to be 1.4 cm at the 6 o'clock position.   MRI revealed the mass to be a little bit bigger at 2.7 cm. Biopsy showed an invasive ductal carcinoma, grade 2, estrogen receptor/progesterone receptor positive, HER-2/neu negative, with a Ki-67 of 70%.   #2  Patient desires breast conservation and started receiving neoadjuvant chemotherapy consisting of Q2 week dose  dense FEC for a total of 6 cycles.  This will be followed by Taxol weekly for a total of 12 weeks. Risks benefits and side effects of this treatment were discussed with the patient.   #3  After definitive surgery, the plan is for the patient to receive radiation therapy adjuvantly under the direction of Dr. Basilio Cairo.  #4  Patient will need antiestrogen therapy after radiation completion because her tumor is estrogen receptor positive.  Since she has not been without a menses for one year, therefore our plan is to proceed with antiestrogen therapy of Tamoxifen for 10 years.   #5 Acute on chronic anemia and GI bleeding - patient completed blood  transfusion of 2 units of PRBCs on 12/09/2012 and consult with gastroenterologist Claudette Head, M.D on 12/12/2012 .  PLAN:  #1 Ms. Martinson is doing well today.  I reviewed her CBC and CMP with her.  She is anemic.  She will proceed with Taxol today.  She will return tomorrow for 2 units of PRBCs.    #2 She will return next week for labs and evaluation and her weekly Taxol.  She is tolerating it well.    All questions answered. Ms. Tessmer and her mother were encouraged to contact us in the interim with any questions, concerns, or problems.   I spent 25 minutes counseling the patient face to face.  The total time spent in the appointment was 30 minutes.  Illa Level, NP Medical Oncology [facility] [phone] 01/13/2013    9:38 AM  ATTENDING'S ATTESTATION:  I personally reviewed patient's chart, examined patient myself, formulated the treatment plan as followed.    Continue care as outlined above  Drue Second, MD Medical/Oncology [facility] [phone] (beeper) [phone] (Office)  01/27/2013, 9:12 AM

## 2013-01-13 NOTE — Patient Instructions (Signed)
Cancer Center Discharge Instructions for Patients Receiving Chemotherapy  Today you received the following chemotherapy agents: taxol  To help prevent nausea and vomiting after your treatment, we encourage you to take your nausea medication.  Take it as often as prescribed.     If you develop nausea and vomiting that is not controlled by your nausea medication, call the clinic. If it is after clinic hours your family physician or the after hours number for the clinic or go to the Emergency Department.   BELOW ARE SYMPTOMS THAT SHOULD BE REPORTED IMMEDIATELY:  *FEVER GREATER THAN 100.5 F  *CHILLS WITH OR WITHOUT FEVER  NAUSEA AND VOMITING THAT IS NOT CONTROLLED WITH YOUR NAUSEA MEDICATION  *UNUSUAL SHORTNESS OF BREATH  *UNUSUAL BRUISING OR BLEEDING  TENDERNESS IN MOUTH AND THROAT WITH OR WITHOUT PRESENCE OF ULCERS  *URINARY PROBLEMS  *BOWEL PROBLEMS  UNUSUAL RASH Items with * indicate a potential emergency and should be followed up as soon as possible.  Feel free to call the clinic you have any questions or concerns. The clinic phone number is [phone].   I have been informed and understand all the instructions given to me. I know to contact the clinic, my physician, or go to the Emergency Department if any problems should occur. I do not have any questions at this time, but understand that I may call the clinic during office hours   should I have any questions or need assistance in obtaining follow up care.    __________________________________________  _____________  __________ Signature of Patient or Authorized Representative            Date                   Time    __________________________________________ Nurse's Signature    

## 2013-01-13 NOTE — Telephone Encounter (Signed)
appts made and printed. Pt is aware that tx's will be added. i emailed MW to add the tx's...td 

## 2013-01-13 NOTE — Telephone Encounter (Signed)
Per staff message and POF I have scheduled appts.  JMW  

## 2013-01-13 NOTE — Progress Notes (Signed)
OK to treat with Hgb 7.3.  Pt to receive blood tomorrow, type and screen to be drawn today in infusion.

## 2013-01-14 ENCOUNTER — Ambulatory Visit (HOSPITAL_BASED_OUTPATIENT_CLINIC_OR_DEPARTMENT_OTHER): Payer: BC Managed Care – PPO

## 2013-01-14 VITALS — BP 112/63 | HR 78 | Temp 98.1°F | Resp 18

## 2013-01-14 DIAGNOSIS — D649 Anemia, unspecified: Secondary | ICD-10-CM

## 2013-01-14 LAB — PREPARE RBC (CROSSMATCH)

## 2013-01-14 MED ORDER — HEPARIN SOD (PORK) LOCK FLUSH 100 UNIT/ML IV SOLN
500.0000 [IU] | Freq: Every day | INTRAVENOUS | Status: AC | PRN
  Administered 2013-01-14: 500 [IU]
  Filled 2013-01-14: qty 5

## 2013-01-14 MED ORDER — SODIUM CHLORIDE 0.9 % IJ SOLN
10.0000 mL | INTRAMUSCULAR | Status: AC | PRN
  Administered 2013-01-14: 10 mL
  Filled 2013-01-14: qty 10

## 2013-01-14 MED ORDER — SODIUM CHLORIDE 0.9 % IV SOLN
250.0000 mL | Freq: Once | INTRAVENOUS | Status: AC
  Administered 2013-01-14: 250 mL via INTRAVENOUS

## 2013-01-14 NOTE — Patient Instructions (Signed)
Blood Transfusion  A blood transfusion replaces your blood or some of its parts. Blood is replaced when you have lost blood because of surgery, an accident, or for severe blood conditions like anemia. You can donate blood to be used on yourself if you have a planned surgery. If you lose blood during that surgery, your own blood can be given back to you. Any blood given to you is checked to make sure it matches your blood type. Your temperature, blood pressure, and heart rate (vital signs) will be checked often.  GET HELP RIGHT AWAY IF:   You feel sick to your stomach (nauseous) or throw up (vomit).  You have watery poop (diarrhea).  You have shortness of breath or trouble breathing.  You have blood in your pee (urine) or have dark colored pee.  You have chest pain or tightness.  Your eyes or skin turn yellow (jaundice).  You have a temperature by mouth above 102 F (38.9 C), not controlled by medicine.  You start to shake and have chills.  You develop a a red rash (hives) or feel itchy.  You develop lightheadedness or feel confused.  You develop back, joint, or muscle pain.  You do not feel hungry (lost appetite).  You feel tired, restless, or nervous.  You develop belly (abdominal) cramps. Document Released: 04/17/2008 Document Revised: 04/13/2011 Document Reviewed: 04/17/2008 [employer] Patient Information 2014 [employer].  

## 2013-01-15 LAB — TYPE AND SCREEN: Unit division: 0

## 2013-01-17 ENCOUNTER — Ambulatory Visit (INDEPENDENT_AMBULATORY_CARE_PROVIDER_SITE_OTHER): Payer: BC Managed Care – PPO | Admitting: Interventional Cardiology

## 2013-01-17 ENCOUNTER — Encounter: Payer: Self-pay | Admitting: Interventional Cardiology

## 2013-01-17 VITALS — BP 126/67 | HR 89 | Ht 66.0 in | Wt 216.0 lb

## 2013-01-17 DIAGNOSIS — R55 Syncope and collapse: Secondary | ICD-10-CM | POA: Insufficient documentation

## 2013-01-17 LAB — TROPONIN I: Troponin I: <0.3 ng/mL (ref <0.30)

## 2013-01-17 NOTE — Progress Notes (Addendum)
Patient ID: Mary Dougherty, female   DOB: 04/16/1970, 42 y.o.   MRN: [id number]   Date: 01/17/2013 ID: Mary Dougherty, DOB 09-21-1970, MRN [id number] PCP: Lucilla Edin, MD  Reason: Syncope  ASSESSMENT;  1. Neurocardiogenic syncope, recurrent throughout the patient's life span 2. estrogen receptor positive breast cancer. The patient is status post chemotherapy including anthracycline. Now on Taxol.  PLAN:  1. Teaching concerning vasovagal syncope was performed. No specific cardiac workup at this time. 2. Troponin and BNP are obtained. If no evidence of biomarker elevation, will not repeat echo. If biomarkers are elevated, repeat echo will be done to assess LV function.   SUBJECTIVE: Mary Dougherty is a 42 y.o. female who is referred for evaluation of syncope. The history is significant for episodes of syncope occurring back to the age of 1. He appreciated by a prodrome of nausea, diaphoresis, and dizziness. She has learned over the years if she lays down the symptoms resolve she is able to resume her activities. She has purposefully essentially given up driving for safety reasons. These episodes are relatively infrequent. They have occurred more frequently this year since the diagnosis of breast cancer and the initiation of chemotherapy. Her most recent episode occurred last week at work. She began to feel weak and tired. She developed diaphoresis. There was some nausea. She tried to walk to her office and became presyncopal/syncopal. She laid on the floor and eventually came to. She encouraged her supervisor not to call EMS. She was taken home by her family. She has done fine since that time.  LVEF was normal in August prior to chemotherapy.   No Known Allergies  Current Outpatient Prescriptions on File Prior to Visit  Medication Sig Dispense Refill  . cetirizine (ZYRTEC) 10 MG tablet Take 10 mg by mouth daily.      . Cholecalciferol (VITAMIN D3) 1000 UNITS CAPS Take 1  capsule by mouth daily.      Marland Kitchen docusate sodium (COLACE) 100 MG capsule Take 100 mg by mouth 3 (three) times daily as needed.       . folic acid (FOLVITE) 400 MCG tablet Take 400 mcg by mouth 2 (two) times daily.       Marland Kitchen lidocaine-prilocaine (EMLA) cream Apply topically as needed.  30 g  6  . Prenatal Vit-Fe Fumarate-FA (PRENATAL MULTIVITAMIN) TABS tablet Take 1 tablet by mouth daily at 12 noon.      . valACYclovir (VALTREX) 500 MG tablet Take 500 mg by mouth daily.        No current facility-administered medications on file prior to visit.    Past Medical History  Diagnosis Date  . Anemia   . Thyroid disease   . Wears glasses   . Breast cancer 2014    ER+/PR+/Her2-  . Spherocytosis 1989    Past Surgical History  Procedure Laterality Date  . Wisdom tooth extraction    . Portacath placement Left 09/20/2012    Procedure: INSERTION PORT-A-CATH;  Surgeon: Emelia Loron, MD;  Location: [facility];  Service: General;  Laterality: Left;    History   Social History  . Marital Status: Single    Spouse Name: N/A    Number of Children: N/A  . Years of Education: N/A   Occupational History  . Not on file.   Social History Main Topics  . Smoking status: Never Smoker   . Smokeless tobacco: Never Used  . Alcohol Use: No  . Drug Use: No  .  Sexual Activity: Not Currently   Other Topics Concern  . Not on file   Social History Narrative  . No narrative on file    Family History  Problem Relation Age of Onset  . Pulmonary embolism Maternal Uncle 45    died instantly  . Melanoma Cousin     paternal cousin dx in her 40s  . Heart disease Father   . Diabetes Father     ROS: Denies weight loss, neurological findings/complaints, edema, abdominal pain, nausea, vomiting, diarrhea, and orthostatic complaints. Hair loss related to chemotherapy. Other systems negative for complaints.  OBJECTIVE: BP 126/67  Pulse 89  Ht 5\' 6"  (1.676 m)  Wt 216 lb (97.977 kg)  BMI  34.88 kg/m2,  General: No acute distress, with hair loss related to chemotherapy HEENT: normal no jaundice or pallor Neck: JVD absent. Carotids absent bruits Chest: Clear to patient and percussion Cardiac: Murmur: None. Gallop: Absent. Rhythm: Regular. Other: Normal Abdomen: Bruit: Absent. Pulsation: Absent Extremities: Edema: Absent. Pulses: 2+ and symmetric Neuro: Normal Psych: Normal  ECG: Non specific TWA, NSR

## 2013-01-17 NOTE — Patient Instructions (Signed)
Your physician recommends that you continue on your current medications as directed. Please refer to the Current Medication list given to you today.  Labs today: Bnp, Troponin  Your physician recommends that you schedule a follow-up appointment as needed/pending lab results

## 2013-01-19 ENCOUNTER — Encounter (INDEPENDENT_AMBULATORY_CARE_PROVIDER_SITE_OTHER): Payer: Self-pay | Admitting: General Surgery

## 2013-01-19 ENCOUNTER — Ambulatory Visit (INDEPENDENT_AMBULATORY_CARE_PROVIDER_SITE_OTHER): Payer: BC Managed Care – PPO | Admitting: General Surgery

## 2013-01-19 ENCOUNTER — Telehealth: Payer: Self-pay

## 2013-01-19 VITALS — BP 112/70 | HR 86 | Temp 97.2°F | Resp 14 | Ht 65.0 in | Wt 196.4 lb

## 2013-01-19 DIAGNOSIS — C50511 Malignant neoplasm of lower-outer quadrant of right female breast: Secondary | ICD-10-CM

## 2013-01-19 DIAGNOSIS — C50519 Malignant neoplasm of lower-outer quadrant of unspecified female breast: Secondary | ICD-10-CM

## 2013-01-19 NOTE — Telephone Encounter (Signed)
Message copied by Jarvis Newcomer on Thu Jan 19, 2013  3:26 PM ------      Message from: Verdis Prime      Created: Tue Jan 17, 2013  5:35 PM       The heart labs are okay. I don't believe a repeat echo as needed given exam. No further clinical evaluation. ------

## 2013-01-19 NOTE — Telephone Encounter (Signed)
pt given lab results.  The heart labs are okay. Dr.Smit doesnt believe a repeat echo is needed given exam. No further clinical evaluation.pt verbalized understanding.

## 2013-01-19 NOTE — Progress Notes (Signed)
Subjective:     Patient ID: Mary Dougherty, female   DOB: Jan 23, 1971, 42 y.o.   MRN: [id number]  HPI 45 yof who palpated a right breast mass on her self exam. She underwent mm that showed a right breast abnormality that was followed by u/s showing a 1.4 cm right breast mass. MRI then showed 2.7x2.3.1.6 cm mass with no other abnormalities. Biopsy showed a grade I IDC that is er/pr positive, her2 negative and Ki is 70%. She was begun on primary chemotherapy to try to improve her chances of being able to do breast conservation therapy which was her choice. She has done fairly well during chemotherapy at this point. She should be complete with her chemotherapy on February 13. She will receive the fourth of her 12 doses of attacks same tomorrow. She's had a couple of syncopal episodes which are thought to be vasovagal. She says there is gotten much smaller. Her only real complaint is about her port and when she is lying on it.   Review of Systems     Objective:   Physical Exam  Vitals reviewed. Constitutional: She appears well-developed and well-nourished.  Pulmonary/Chest: Right breast exhibits no inverted nipple, no mass, no nipple discharge, no skin change and no tenderness. Left breast exhibits no inverted nipple, no mass, no nipple discharge, no skin change and no tenderness.    Lymphadenopathy:    She has no cervical adenopathy.    She has no axillary adenopathy.       Right: No supraclavicular adenopathy present.       Left: No supraclavicular adenopathy present.       Assessment:     Right breast cancer undergoing primary systemic chemotherapy     Plan:     She appears to have a good response. She will plan on getting an MRI somewhere towards the middle of February. I will need to see her back after the MRI we will plan her surgery for early March. This would likely be a lumpectomy and a sentinel lymph node biopsy at that time.    I will also plan to take her port out at the  same time

## 2013-01-20 ENCOUNTER — Telehealth: Payer: Self-pay | Admitting: *Deleted

## 2013-01-20 ENCOUNTER — Encounter: Payer: Self-pay | Admitting: Adult Health

## 2013-01-20 ENCOUNTER — Telehealth: Payer: Self-pay | Admitting: Oncology

## 2013-01-20 ENCOUNTER — Ambulatory Visit (HOSPITAL_BASED_OUTPATIENT_CLINIC_OR_DEPARTMENT_OTHER): Payer: BC Managed Care – PPO

## 2013-01-20 ENCOUNTER — Ambulatory Visit (HOSPITAL_BASED_OUTPATIENT_CLINIC_OR_DEPARTMENT_OTHER): Payer: BC Managed Care – PPO | Admitting: Adult Health

## 2013-01-20 ENCOUNTER — Other Ambulatory Visit (HOSPITAL_BASED_OUTPATIENT_CLINIC_OR_DEPARTMENT_OTHER): Payer: BC Managed Care – PPO

## 2013-01-20 VITALS — BP 132/82 | HR 92 | Temp 98.5°F | Resp 20 | Ht 65.0 in | Wt 199.4 lb

## 2013-01-20 DIAGNOSIS — Z5111 Encounter for antineoplastic chemotherapy: Secondary | ICD-10-CM

## 2013-01-20 DIAGNOSIS — C50511 Malignant neoplasm of lower-outer quadrant of right female breast: Secondary | ICD-10-CM

## 2013-01-20 DIAGNOSIS — C50519 Malignant neoplasm of lower-outer quadrant of unspecified female breast: Secondary | ICD-10-CM

## 2013-01-20 LAB — COMPREHENSIVE METABOLIC PANEL (CC13)
ALT: 22 U/L (ref 0–55)
AST: 21 U/L (ref 5–34)
Albumin: 3.7 g/dL (ref 3.5–5.0)
BUN: 5 mg/dL — ABNORMAL LOW (ref 7.0–26.0)
Calcium: 9 mg/dL (ref 8.4–10.4)
Chloride: 109 mEq/L (ref 98–109)
Glucose: 106 mg/dl (ref 70–140)
Potassium: 3.6 mEq/L (ref 3.5–5.1)
Total Bilirubin: 1.14 mg/dL (ref 0.20–1.20)
Total Protein: 6.4 g/dL (ref 6.4–8.3)

## 2013-01-20 LAB — CBC WITH DIFFERENTIAL/PLATELET
Basophils Absolute: 0.1 10*3/uL (ref 0.0–0.1)
EOS%: 4.1 % (ref 0.0–7.0)
HCT: 31.1 % — ABNORMAL LOW (ref 34.8–46.6)
HGB: 10.5 g/dL — ABNORMAL LOW (ref 11.6–15.9)
MONO#: 0.5 10*3/uL (ref 0.1–0.9)
NEUT%: 69.9 % (ref 38.4–76.8)
WBC: 5.6 10*3/uL (ref 3.9–10.3)
lymph#: 0.8 10*3/uL — ABNORMAL LOW (ref 0.9–3.3)
nRBC: 1 % — ABNORMAL HIGH (ref 0–0)

## 2013-01-20 MED ORDER — DIPHENHYDRAMINE HCL 50 MG/ML IJ SOLN
50.0000 mg | Freq: Once | INTRAMUSCULAR | Status: AC
  Administered 2013-01-20: 50 mg via INTRAVENOUS

## 2013-01-20 MED ORDER — ONDANSETRON 8 MG/NS 50 ML IVPB
INTRAVENOUS | Status: AC
  Filled 2013-01-20: qty 8

## 2013-01-20 MED ORDER — ONDANSETRON 8 MG/50ML IVPB (CHCC)
8.0000 mg | Freq: Once | INTRAVENOUS | Status: AC
  Administered 2013-01-20: 8 mg via INTRAVENOUS

## 2013-01-20 MED ORDER — DIPHENHYDRAMINE HCL 50 MG/ML IJ SOLN
INTRAMUSCULAR | Status: AC
  Filled 2013-01-20: qty 1

## 2013-01-20 MED ORDER — SODIUM CHLORIDE 0.9 % IV SOLN
80.0000 mg/m2 | Freq: Once | INTRAVENOUS | Status: AC
  Administered 2013-01-20: 162 mg via INTRAVENOUS
  Filled 2013-01-20: qty 27

## 2013-01-20 MED ORDER — FAMOTIDINE IN NACL 20-0.9 MG/50ML-% IV SOLN
20.0000 mg | Freq: Once | INTRAVENOUS | Status: AC
  Administered 2013-01-20: 20 mg via INTRAVENOUS

## 2013-01-20 MED ORDER — HEPARIN SOD (PORK) LOCK FLUSH 100 UNIT/ML IV SOLN
500.0000 [IU] | Freq: Once | INTRAVENOUS | Status: AC | PRN
  Administered 2013-01-20: 500 [IU]
  Filled 2013-01-20: qty 5

## 2013-01-20 MED ORDER — FAMOTIDINE IN NACL 20-0.9 MG/50ML-% IV SOLN
INTRAVENOUS | Status: AC
  Filled 2013-01-20: qty 50

## 2013-01-20 MED ORDER — DEXAMETHASONE SODIUM PHOSPHATE 20 MG/5ML IJ SOLN
INTRAMUSCULAR | Status: AC
  Filled 2013-01-20: qty 5

## 2013-01-20 MED ORDER — DEXAMETHASONE SODIUM PHOSPHATE 20 MG/5ML IJ SOLN
20.0000 mg | Freq: Once | INTRAMUSCULAR | Status: AC
  Administered 2013-01-20: 20 mg via INTRAVENOUS

## 2013-01-20 MED ORDER — SODIUM CHLORIDE 0.9 % IV SOLN
Freq: Once | INTRAVENOUS | Status: AC
  Administered 2013-01-20: 11:00:00 via INTRAVENOUS

## 2013-01-20 MED ORDER — SODIUM CHLORIDE 0.9 % IJ SOLN
10.0000 mL | INTRAMUSCULAR | Status: DC | PRN
  Administered 2013-01-20: 10 mL
  Filled 2013-01-20: qty 10

## 2013-01-20 NOTE — Patient Instructions (Signed)
[facility] Discharge Instructions for Patients Receiving Chemotherapy  Today you received the following chemotherapy agents Taxol  To help prevent nausea and vomiting after your treatment, we encourage you to take your nausea medication    If you develop nausea and vomiting that is not controlled by your nausea medication, call the clinic.   BELOW ARE SYMPTOMS THAT SHOULD BE REPORTED IMMEDIATELY:  *FEVER GREATER THAN 100.5 F  *CHILLS WITH OR WITHOUT FEVER  NAUSEA AND VOMITING THAT IS NOT CONTROLLED WITH YOUR NAUSEA MEDICATION  *UNUSUAL SHORTNESS OF BREATH  *UNUSUAL BRUISING OR BLEEDING  TENDERNESS IN MOUTH AND THROAT WITH OR WITHOUT PRESENCE OF ULCERS  *URINARY PROBLEMS  *BOWEL PROBLEMS  UNUSUAL RASH Items with * indicate a potential emergency and should be followed up as soon as possible.  Feel free to call the clinic you have any questions or concerns. The clinic phone number is [phone].    

## 2013-01-20 NOTE — Telephone Encounter (Signed)
Per staff messge I have adjusted 1/2 appt

## 2013-01-20 NOTE — Progress Notes (Signed)
[facility]  Telephone:(336) [phone] Fax:(336) [phone]  OFFICE PROGRESS NOTE    ID: Mary Dougherty   DOB: 1970/07/14  MR#: [id number]  JYN#:[id number]   PCP: Lucilla Edin, MD GI: Claudette Head, M.D.   DIAGNOSIS:  Mary Dougherty is a 42 y.o. female diagnosed in 09/2012 with invasive ductal carcinoma of the right breast that is estrogen receptor positive, progesterone receptor positive, HER-2/neu negative.   PRIOR THERAPY: 1.  Patient went for her initial screening mammogram at the insistence of her sister. She was found to have a mass in the right breast. The ultrasound demonstrated a 1.4 cm area of concern. She had an MRI performed that revealed a 2.7 cm irregular area at the 6 o'clock location. There were no masses in the left breast no abnormal lymph nodes. Patient had a biopsy performed of the right breast that revealed an intermediate to high-grade invasive ductal carcinoma the tumor was estrogen receptor positive 99%, progesterone receptor positive 100%,  HER-2/neu negative with a Ki-67 of 70%.   2. received neoadjuvant chemotherapy with dose dense FEC (5-FU/epirubicin/Cytoxan) from 10/07/2012 through 12/16/12.  She started weekly Taxol on 12/30/12.    3.  Acute on chronic anemia.   CURRENT THERAPY: Weekly Taxol  INTERVAL HISTORY: Mary Dougherty is a  42 y.o. female who returns for evaluation prior to weekly Taxol.  She is doing well today.  She received PRBCs on Saturday and tolerated these well.  She was evaluated by Dr. Dwain Sarna yesterday.  She denies fevers, chills, nausea, vomiting, constipation, diarrhea, numbness, or any further concerns.  She has not had any further syncopal episodes.    MEDICAL HISTORY: Past Medical History  Diagnosis Date  . Anemia   . Thyroid disease   . Wears glasses   . Breast cancer 2014    ER+/PR+/Her2-  . Spherocytosis 1989    ALLERGIES:   No Known Allergies   MEDICATIONS:  Current Outpatient  Prescriptions  Medication Sig Dispense Refill  . cetirizine (ZYRTEC) 10 MG tablet Take 10 mg by mouth daily.      . Cholecalciferol (VITAMIN D3) 1000 UNITS CAPS Take 1 capsule by mouth daily.      Marland Kitchen docusate sodium (COLACE) 100 MG capsule Take 100 mg by mouth 3 (three) times daily as needed.       . folic acid (FOLVITE) 400 MCG tablet Take 400 mcg by mouth 2 (two) times daily.       Marland Kitchen lidocaine-prilocaine (EMLA) cream Apply topically as needed.  30 g  6  . Prenatal Vit-Fe Fumarate-FA (PRENATAL MULTIVITAMIN) TABS tablet Take 1 tablet by mouth daily at 12 noon.      . valACYclovir (VALTREX) 500 MG tablet Take 500 mg by mouth daily.       Marland Kitchen LORazepam (ATIVAN) 0.5 MG tablet        No current facility-administered medications for this visit.    SURGICAL HISTORY:  Past Surgical History  Procedure Laterality Date  . Wisdom tooth extraction    . Portacath placement Left 09/20/2012    Procedure: INSERTION PORT-A-CATH;  Surgeon: Emelia Loron, MD;  Location: [facility];  Service: General;  Laterality: Left;    REVIEW OF SYSTEMS:  A 10 point review of systems was completed and is negative except as stated above.    PHYSICAL EXAMINATION: Blood pressure 132/82, pulse 92, temperature 98.5 F (36.9 C), temperature source Oral, resp. rate 20, height 5\' 5"  (1.651 m), weight 199 lb 6.4  oz (90.447 kg). Body mass index is 33.18 kg/(m^2). General: Patient is a well appearing female in no acute distress HEENT: PERRLA, sclerae anicteric no conjunctival pallor, MMM Neck: supple, no palpable adenopathy Lungs: clear to auscultation bilaterally, no wheezes, rhonchi, or rales Cardiovascular: regular rate rhythm, S1, S2, no murmurs, rubs or gallops Abdomen: Soft, non-tender, non-distended, normoactive bowel sounds, no HSM Extremities: warm and well perfused, no clubbing, cyanosis, or edema Skin: No rashes or lesions Neuro: Non-focal Breasts: right breast nodule at 6 is softer,a bout  1-2cm ECOG FS: 1 - Symptomatic but completely ambulatory  LABORATORY DATA: Lab Results  Component Value Date   WBC 5.6 01/20/2013   HGB 10.5* 01/20/2013   HCT 31.1* 01/20/2013   MCV 84.3 01/20/2013   PLT 205 01/20/2013      Chemistry      Component Value Date/Time   NA 141 01/20/2013 0855   NA 136 10/26/2012 0918   K 3.6 01/20/2013 0855   K 3.3* 10/26/2012 0918   CL 101 10/26/2012 0918   CO2 22 01/20/2013 0855   CO2 27 10/26/2012 0918   BUN 5.0* 01/20/2013 0855   BUN 15 10/26/2012 0918   CREATININE 0.6 01/20/2013 0855   CREATININE 0.60 10/26/2012 0918      Component Value Date/Time   CALCIUM 9.0 01/20/2013 0855   CALCIUM 8.4 10/26/2012 0918   ALKPHOS 75 01/20/2013 0855   ALKPHOS 118* 10/26/2012 0918   AST 21 01/20/2013 0855   AST 12 10/26/2012 0918   ALT 22 01/20/2013 0855   ALT 18 10/26/2012 0918   BILITOT 1.14 01/20/2013 0855   BILITOT 0.6 10/26/2012 0918       RADIOGRAPHIC STUDIES: No results found.   ASSESSMENT:   Mary Dougherty is a 42 y.o. female with:   #1 First ever mammogram found an abnormal mass in the right breast.  Ultrasound confirmed the mass to be 1.4 cm at the 6 o'clock position.   MRI revealed the mass to be a little bit bigger at 2.7 cm. Biopsy showed an invasive ductal carcinoma, grade 2, estrogen receptor/progesterone receptor positive, HER-2/neu negative, with a Ki-67 of 70%.   #2  Patient desires breast conservation and started receiving neoadjuvant chemotherapy consisting of Q2 week dose dense FEC for a total of 6 cycles.  This will be followed by Taxol weekly for a total of 12 weeks. Risks benefits and side effects of this treatment were discussed with the patient.   #3  After definitive surgery, the plan is for the patient to receive radiation therapy adjuvantly under the direction of Dr. Basilio Cairo.  #4  Patient will need antiestrogen therapy after radiation completion because her tumor is estrogen receptor positive.  Since she has not been  without a menses for one year, therefore our plan is to proceed with antiestrogen therapy of Tamoxifen for 10 years.   #5 Acute on chronic anemia and GI bleeding - patient completed blood transfusion of 2 units of PRBCs on 12/09/2012 and consult with gastroenterologist Claudette Head, M.D on 12/12/2012 .  PLAN:  #1 Ms. Weng is doing well today.  She will proceed with chemotherapy today.  #2 She will return next week for labs and evaluation and her weekly Taxol.  She is tolerating it well.    All questions answered. Ms. Morro and her mother were encouraged to contact us in the interim with any questions, concerns, or problems.   I spent 25 minutes counseling the patient face to face.  The total  time spent in the appointment was 30 minutes.  Illa Level, NP Medical Oncology [facility] [phone] 01/21/2013    8:30 AM

## 2013-01-27 ENCOUNTER — Encounter: Payer: Self-pay | Admitting: Adult Health

## 2013-01-27 ENCOUNTER — Other Ambulatory Visit (HOSPITAL_BASED_OUTPATIENT_CLINIC_OR_DEPARTMENT_OTHER): Payer: BC Managed Care – PPO

## 2013-01-27 ENCOUNTER — Ambulatory Visit (HOSPITAL_BASED_OUTPATIENT_CLINIC_OR_DEPARTMENT_OTHER): Payer: BC Managed Care – PPO

## 2013-01-27 ENCOUNTER — Ambulatory Visit (HOSPITAL_BASED_OUTPATIENT_CLINIC_OR_DEPARTMENT_OTHER): Payer: BC Managed Care – PPO | Admitting: Adult Health

## 2013-01-27 VITALS — BP 101/61 | HR 103 | Temp 98.1°F | Resp 18 | Ht 65.0 in | Wt 199.9 lb

## 2013-01-27 DIAGNOSIS — C50519 Malignant neoplasm of lower-outer quadrant of unspecified female breast: Secondary | ICD-10-CM

## 2013-01-27 DIAGNOSIS — C50511 Malignant neoplasm of lower-outer quadrant of right female breast: Secondary | ICD-10-CM

## 2013-01-27 DIAGNOSIS — J3489 Other specified disorders of nose and nasal sinuses: Secondary | ICD-10-CM

## 2013-01-27 DIAGNOSIS — Z170 Estrogen receptor positive status [ER+]: Secondary | ICD-10-CM

## 2013-01-27 DIAGNOSIS — D649 Anemia, unspecified: Secondary | ICD-10-CM

## 2013-01-27 DIAGNOSIS — Z5111 Encounter for antineoplastic chemotherapy: Secondary | ICD-10-CM

## 2013-01-27 LAB — COMPREHENSIVE METABOLIC PANEL (CC13)
AST: 19 U/L (ref 5–34)
Albumin: 3.5 g/dL (ref 3.5–5.0)
Alkaline Phosphatase: 71 U/L (ref 40–150)
Anion Gap: 9 mEq/L (ref 3–11)
BUN: 5.6 mg/dL — ABNORMAL LOW (ref 7.0–26.0)
CO2: 24 mEq/L (ref 22–29)
Creatinine: 0.7 mg/dL (ref 0.6–1.1)
Glucose: 86 mg/dl (ref 70–140)
Sodium: 141 mEq/L (ref 136–145)
Total Bilirubin: 0.98 mg/dL (ref 0.20–1.20)
Total Protein: 6.3 g/dL — ABNORMAL LOW (ref 6.4–8.3)

## 2013-01-27 LAB — CBC WITH DIFFERENTIAL/PLATELET
Basophils Absolute: 0.1 10*3/uL (ref 0.0–0.1)
EOS%: 3.9 % (ref 0.0–7.0)
Eosinophils Absolute: 0.3 10*3/uL (ref 0.0–0.5)
HGB: 8.8 g/dL — ABNORMAL LOW (ref 11.6–15.9)
LYMPH%: 16.2 % (ref 14.0–49.7)
MCH: 28.2 pg (ref 25.1–34.0)
MCHC: 33.1 g/dL (ref 31.5–36.0)
MCV: 85.3 fL (ref 79.5–101.0)
MONO%: 6.1 % (ref 0.0–14.0)
NEUT#: 5.3 10*3/uL (ref 1.5–6.5)
Platelets: 241 10*3/uL (ref 145–400)
RBC: 3.12 10*6/uL — ABNORMAL LOW (ref 3.70–5.45)
RDW: 20.1 % — ABNORMAL HIGH (ref 11.2–14.5)

## 2013-01-27 LAB — TECHNOLOGIST REVIEW: Technologist Review: 1

## 2013-01-27 MED ORDER — SODIUM CHLORIDE 0.9 % IJ SOLN
10.0000 mL | INTRAMUSCULAR | Status: DC | PRN
  Administered 2013-01-27: 10 mL
  Filled 2013-01-27: qty 10

## 2013-01-27 MED ORDER — SODIUM CHLORIDE 0.9 % IV SOLN
80.0000 mg/m2 | Freq: Once | INTRAVENOUS | Status: AC
  Administered 2013-01-27: 162 mg via INTRAVENOUS
  Filled 2013-01-27: qty 27

## 2013-01-27 MED ORDER — SODIUM CHLORIDE 0.9 % IV SOLN
Freq: Once | INTRAVENOUS | Status: AC
  Administered 2013-01-27: 10:00:00 via INTRAVENOUS

## 2013-01-27 MED ORDER — DIPHENHYDRAMINE HCL 50 MG/ML IJ SOLN
INTRAMUSCULAR | Status: AC
  Filled 2013-01-27: qty 1

## 2013-01-27 MED ORDER — HEPARIN SOD (PORK) LOCK FLUSH 100 UNIT/ML IV SOLN
500.0000 [IU] | Freq: Once | INTRAVENOUS | Status: AC | PRN
  Administered 2013-01-27: 500 [IU]
  Filled 2013-01-27: qty 5

## 2013-01-27 MED ORDER — DEXAMETHASONE SODIUM PHOSPHATE 20 MG/5ML IJ SOLN
20.0000 mg | Freq: Once | INTRAMUSCULAR | Status: AC
  Administered 2013-01-27: 20 mg via INTRAVENOUS

## 2013-01-27 MED ORDER — ONDANSETRON 8 MG/50ML IVPB (CHCC): 8.0000 mg | Freq: Once | INTRAVENOUS | Status: DC

## 2013-01-27 MED ORDER — DEXAMETHASONE SODIUM PHOSPHATE 20 MG/5ML IJ SOLN
INTRAMUSCULAR | Status: AC
  Filled 2013-01-27: qty 5

## 2013-01-27 MED ORDER — DIPHENHYDRAMINE HCL 50 MG/ML IJ SOLN
50.0000 mg | Freq: Once | INTRAMUSCULAR | Status: AC
  Administered 2013-01-27: 50 mg via INTRAVENOUS

## 2013-01-27 MED ORDER — ONDANSETRON 16 MG/50ML IVPB (CHCC)
INTRAVENOUS | Status: AC
  Filled 2013-01-27: qty 16

## 2013-01-27 MED ORDER — ONDANSETRON 16 MG/50ML IVPB (CHCC)
16.0000 mg | Freq: Once | INTRAVENOUS | Status: AC
  Administered 2013-01-27: 16 mg via INTRAVENOUS

## 2013-01-27 MED ORDER — FAMOTIDINE IN NACL 20-0.9 MG/50ML-% IV SOLN
INTRAVENOUS | Status: AC
  Filled 2013-01-27: qty 50

## 2013-01-27 MED ORDER — FAMOTIDINE IN NACL 20-0.9 MG/50ML-% IV SOLN
20.0000 mg | Freq: Once | INTRAVENOUS | Status: AC
  Administered 2013-01-27: 20 mg via INTRAVENOUS

## 2013-01-27 NOTE — Progress Notes (Signed)
Patient states that she gets restless legs with treatment, instructed patient to discuss with next weeks treatment to see if reducing the dose of Benadryl from 50mg  to 25mg  will help.

## 2013-01-27 NOTE — Progress Notes (Addendum)
[facility]  Telephone:(336) [phone] Fax:(336) [phone]  OFFICE PROGRESS NOTE    ID: Mary Dougherty   DOB: Mar 22, 1970  MR#: [id number]  ZHY#:[id number]   PCP: Mary Edin, MD GI: Mary Dougherty, M.D.   DIAGNOSIS:  Mary Dougherty is a 42 y.o. female diagnosed in 09/2012 with invasive ductal carcinoma of the right breast that is estrogen receptor positive, progesterone receptor positive, HER-2/neu negative.   PRIOR THERAPY: 1.  Patient went for her initial screening mammogram at the insistence of her sister. She was found to have a mass in the right breast. The ultrasound demonstrated a 1.4 cm area of concern. She had an MRI performed that revealed a 2.7 cm irregular area at the 6 o'clock location. There were no masses in the left breast no abnormal lymph nodes. Patient had a biopsy performed of the right breast that revealed an intermediate to high-grade invasive ductal carcinoma the tumor was estrogen receptor positive 99%, progesterone receptor positive 100%,  HER-2/neu negative with a Ki-67 of 70%.   2. received neoadjuvant chemotherapy with dose dense FEC (5-FU/epirubicin/Cytoxan) from 10/07/2012 through 12/16/12.  She started weekly Taxol on 12/30/12.    3.  Acute on chronic anemia.   CURRENT THERAPY: Weekly Taxol  INTERVAL HISTORY: Mary Dougherty is a  42 y.o. female who returns for evaluation prior to weekly Taxol.  She is doing well today. She does have an occasional small amount bloody nasal drainage in the tissue when she blows her nose.  She has not been using saline nasal spray.  She denies any fevers, chills, nausea, vomiting, constipation, diarrhea, numbness, pain, melena, hematochezia, or any further concerns.    MEDICAL HISTORY: Past Medical History  Diagnosis Date  . Anemia   . Thyroid disease   . Wears glasses   . Breast cancer 2014    ER+/PR+/Her2-  . Spherocytosis 1989    ALLERGIES:   No Known Allergies   MEDICATIONS:   Current Outpatient Prescriptions  Medication Sig Dispense Refill  . cetirizine (ZYRTEC) 10 MG tablet Take 10 mg by mouth daily.      . Cholecalciferol (VITAMIN D3) 1000 UNITS CAPS Take 1 capsule by mouth daily.      Marland Kitchen docusate sodium (COLACE) 100 MG capsule Take 100 mg by mouth 3 (three) times daily as needed.       . folic acid (FOLVITE) 400 MCG tablet Take 400 mcg by mouth 2 (two) times daily.       Marland Kitchen lidocaine-prilocaine (EMLA) cream Apply topically as needed.  30 g  6  . Prenatal Vit-Fe Fumarate-FA (PRENATAL MULTIVITAMIN) TABS tablet Take 1 tablet by mouth daily at 12 noon.      . valACYclovir (VALTREX) 500 MG tablet Take 500 mg by mouth daily.        No current facility-administered medications for this visit.    SURGICAL HISTORY:  Past Surgical History  Procedure Laterality Date  . Wisdom tooth extraction    . Portacath placement Left 09/20/2012    Procedure: INSERTION PORT-A-CATH;  Surgeon: Mary Loron, MD;  Location: [facility];  Service: General;  Laterality: Left;    REVIEW OF SYSTEMS:  A 10 point review of systems was completed and is negative except as stated above.    PHYSICAL EXAMINATION: Blood pressure 101/61, pulse 103, temperature 98.1 F (36.7 C), temperature source Oral, resp. rate 18, height 5\' 5"  (1.651 m), weight 199 lb 14.4 oz (90.674 kg). Body mass index is 33.27  kg/(m^2). General: Patient is a well appearing female in no acute distress HEENT: PERRLA, sclerae anicteric no conjunctival pallor, MMM, no bloody drainage visualized in either nares or nasal turbinates.   Neck: supple, no palpable adenopathy Lungs: clear to auscultation bilaterally, no wheezes, rhonchi, or rales Cardiovascular: regular rate rhythm, S1, S2, no murmurs, rubs or gallops Abdomen: Soft, non-tender, non-distended, normoactive bowel sounds, no HSM Extremities: warm and well perfused, no clubbing, cyanosis, or edema Skin: No rashes or lesions Neuro:  Non-focal Breasts: right breast nodule at 6 is softer,a bout 1-2cm ECOG FS: 1 - Symptomatic but completely ambulatory  LABORATORY DATA: Lab Results  Component Value Date   WBC 7.4 01/27/2013   HGB 8.8* 01/27/2013   HCT 26.6* 01/27/2013   MCV 85.3 01/27/2013   PLT 241 01/27/2013      Chemistry      Component Value Date/Time   NA 141 01/20/2013 0855   NA 136 10/26/2012 0918   K 3.6 01/20/2013 0855   K 3.3* 10/26/2012 0918   CL 101 10/26/2012 0918   CO2 22 01/20/2013 0855   CO2 27 10/26/2012 0918   BUN 5.0* 01/20/2013 0855   BUN 15 10/26/2012 0918   CREATININE 0.6 01/20/2013 0855   CREATININE 0.60 10/26/2012 0918      Component Value Date/Time   CALCIUM 9.0 01/20/2013 0855   CALCIUM 8.4 10/26/2012 0918   ALKPHOS 75 01/20/2013 0855   ALKPHOS 118* 10/26/2012 0918   AST 21 01/20/2013 0855   AST 12 10/26/2012 0918   ALT 22 01/20/2013 0855   ALT 18 10/26/2012 0918   BILITOT 1.14 01/20/2013 0855   BILITOT 0.6 10/26/2012 0918       RADIOGRAPHIC STUDIES: No results found.   ASSESSMENT:   Mary Dougherty is a 42 y.o. female with:   #1 First ever mammogram found an abnormal mass in the right breast.  Ultrasound confirmed the mass to be 1.4 cm at the 6 o'clock position.   MRI revealed the mass to be a little bit bigger at 2.7 cm. Biopsy showed an invasive ductal carcinoma, grade 2, estrogen receptor/progesterone receptor positive, HER-2/neu negative, with a Ki-67 of 70%.   #2  Patient desires breast conservation and started receiving neoadjuvant chemotherapy consisting of Q2 week dose dense FEC for a total of 6 cycles.  This will be followed by Taxol weekly for a total of 12 weeks. Risks benefits and side effects of this treatment were discussed with the patient.   #3  After definitive surgery, the plan is for the patient to receive radiation therapy adjuvantly under the direction of Dr. Basilio Dougherty.  #4  Patient will need antiestrogen therapy after radiation completion because her  tumor is estrogen receptor positive.  Since she has not been without a menses for one year, therefore our plan is to proceed with antiestrogen therapy of Tamoxifen for 10 years.   #5 Acute on chronic anemia and GI bleeding (which has essentially resolved by mid November, 2014) - patient completed blood transfusion of 2 units of PRBCs on 12/09/2012 and consult with gastroenterologist Mary Dougherty, M.D on 12/12/2012 .  She also received an additional two units of PRBCs on 01/14/13.  PLAN:  #1 Doing well.  Her CBC was concerning today for 1% blasts, metas and myleocytes.  Dr. Darrold Span reviewed these results and discussed them with the patient.  She will proceed with Taxol, and f/u with Dr. Welton Flakes in a couple of weeks.  We will order morphology with her future  CBCs.    #2 I recommended she use saline nasal spray and a humidifier at night for the intermittent bloody nasal drainage.    #3 She will return in one week for labs and evaluation.    All questions answered. Mary Dougherty and her sister were encouraged to contact us in the interim with any questions, concerns, or problems.   I spent 25 minutes counseling the patient face to face.  The total time spent in the appointment was 30 minutes.  Illa Level, NP Medical Oncology [facility] [phone] 01/27/2013    9:19 AM  Following NP visit today, this MD covering Dr Welton Flakes notified by [facility] lab that morphology on CBC from today shows at least 1 and possibly 2 blasts, as well as NRBS and same metas/ myelos as on previous morphologies. Peripheral smear reviewed by MD now. Patient then seen by MD, together with sister, and EMR information reviewed (tho this does not include information from hematology evaluation by Dr Mariel Sleet when she was age 21, "always anemic", from patient's history sounds as if diagnosis was possibly hereditary spherocytosis, did not have bone marrow exam). Last neulasta was 12-17-12 after cycle 6 cytoxan/  epirubicin; she is due #5/12 weekly taxol today. She feels somewhat tired as she has been, and has slight nasal congestion without drainage, fever or other symptoms of infection. She has had some minimal epistaxis ~ daily, with nose very dry, has been using zyrtic daily which does not seem to be helping. No other bleeding. No new or different pain.  Her father has had some chronic anemia thought same type "all his life"  On exam she is easily ambulatory, somewhat pale, NAD, very pleasant. Nasal septum with erythema bilateral, no bleeding. No ecchymoses or petechiae, no supraclav or cervical nodes, lungs clear, PAC site ok. LE no edema.  I have told patient and sister that peripheral smear shows a few early and immature blood cells which need to be watched closely, possibly could be related to gCSF previously, tho last ~ 5 wks ago, possibly related to whatever the longstanding hematologic condition is, or possibly something else. Low dose taxol generally is not myelotoxic and seems ok to continue for adjuvant rx of breast cancer this week. Bone marrow may need to be considered if counts worsen or if abnormalities persist, tho chemo effects on marrow may be present if done too close to treatment.   Other labs noted: T bili 0.98. Will add retic and LDH/ haptoglobin/DAT to next labs on 02-04-12, morphology and smears with each weekly CBC 1-2, 1-9 and 1-16  Patient and sister followed conversation well and had questions answered to their satisfaction. She will call between visits if needed and we will continue to monitor closely.  Ila Mcgill, MD

## 2013-01-27 NOTE — Patient Instructions (Signed)
[facility] Discharge Instructions for Patients Receiving Chemotherapy  Today you received the following chemotherapy agents Taxol.  To help prevent nausea and vomiting after your treatment, we encourage you to take your nausea medication if needed.   If you develop nausea and vomiting that is not controlled by your nausea medication, call the clinic.   BELOW ARE SYMPTOMS THAT SHOULD BE REPORTED IMMEDIATELY:  *FEVER GREATER THAN 100.5 F  *CHILLS WITH OR WITHOUT FEVER  NAUSEA AND VOMITING THAT IS NOT CONTROLLED WITH YOUR NAUSEA MEDICATION  *UNUSUAL SHORTNESS OF BREATH  *UNUSUAL BRUISING OR BLEEDING  TENDERNESS IN MOUTH AND THROAT WITH OR WITHOUT PRESENCE OF ULCERS  *URINARY PROBLEMS  *BOWEL PROBLEMS  UNUSUAL RASH Items with * indicate a potential emergency and should be followed up as soon as possible.  Feel free to call the clinic you have any questions or concerns. The clinic phone number is [phone].

## 2013-02-01 ENCOUNTER — Other Ambulatory Visit: Payer: BC Managed Care – PPO

## 2013-02-01 ENCOUNTER — Ambulatory Visit: Payer: BC Managed Care – PPO | Admitting: Adult Health

## 2013-02-02 HISTORY — PX: COLONOSCOPY: SHX174

## 2013-02-03 ENCOUNTER — Telehealth: Payer: Self-pay | Admitting: *Deleted

## 2013-02-03 ENCOUNTER — Encounter: Payer: Self-pay | Admitting: Adult Health

## 2013-02-03 ENCOUNTER — Ambulatory Visit (HOSPITAL_BASED_OUTPATIENT_CLINIC_OR_DEPARTMENT_OTHER): Payer: BC Managed Care – PPO | Admitting: Adult Health

## 2013-02-03 ENCOUNTER — Other Ambulatory Visit (HOSPITAL_BASED_OUTPATIENT_CLINIC_OR_DEPARTMENT_OTHER): Payer: BC Managed Care – PPO

## 2013-02-03 ENCOUNTER — Ambulatory Visit (HOSPITAL_BASED_OUTPATIENT_CLINIC_OR_DEPARTMENT_OTHER): Payer: BC Managed Care – PPO

## 2013-02-03 ENCOUNTER — Telehealth: Payer: Self-pay | Admitting: Oncology

## 2013-02-03 ENCOUNTER — Other Ambulatory Visit: Payer: Self-pay | Admitting: Emergency Medicine

## 2013-02-03 VITALS — BP 127/82 | HR 116 | Temp 98.6°F | Resp 18 | Ht 65.0 in | Wt 197.1 lb

## 2013-02-03 DIAGNOSIS — D649 Anemia, unspecified: Secondary | ICD-10-CM

## 2013-02-03 DIAGNOSIS — C50511 Malignant neoplasm of lower-outer quadrant of right female breast: Secondary | ICD-10-CM

## 2013-02-03 DIAGNOSIS — Z5111 Encounter for antineoplastic chemotherapy: Secondary | ICD-10-CM

## 2013-02-03 DIAGNOSIS — D638 Anemia in other chronic diseases classified elsewhere: Secondary | ICD-10-CM

## 2013-02-03 DIAGNOSIS — C50919 Malignant neoplasm of unspecified site of unspecified female breast: Secondary | ICD-10-CM

## 2013-02-03 DIAGNOSIS — K922 Gastrointestinal hemorrhage, unspecified: Secondary | ICD-10-CM

## 2013-02-03 LAB — CBC WITH DIFFERENTIAL/PLATELET
BASO%: 0.8 % (ref 0.0–2.0)
Basophils Absolute: 0.1 10*3/uL (ref 0.0–0.1)
EOS ABS: 0.2 10*3/uL (ref 0.0–0.5)
EOS%: 1.7 % (ref 0.0–7.0)
HCT: 27.2 % — ABNORMAL LOW (ref 34.8–46.6)
HGB: 9.2 g/dL — ABNORMAL LOW (ref 11.6–15.9)
LYMPH#: 1.4 10*3/uL (ref 0.9–3.3)
LYMPH%: 12 % — ABNORMAL LOW (ref 14.0–49.7)
MCH: 29.2 pg (ref 25.1–34.0)
MCHC: 33.8 g/dL (ref 31.5–36.0)
MCV: 86.3 fL (ref 79.5–101.0)
MONO#: 0.4 10*3/uL (ref 0.1–0.9)
MONO%: 3.4 % (ref 0.0–14.0)
NEUT%: 82.1 % — ABNORMAL HIGH (ref 38.4–76.8)
NEUTROS ABS: 9.4 10*3/uL — AB (ref 1.5–6.5)
Platelets: 238 10*3/uL (ref 145–400)
RBC: 3.15 10*6/uL — ABNORMAL LOW (ref 3.70–5.45)
RDW: 21.7 % — AB (ref 11.2–14.5)
WBC: 11.4 10*3/uL — AB (ref 3.9–10.3)

## 2013-02-03 LAB — COMPREHENSIVE METABOLIC PANEL (CC13)
ALT: 28 U/L (ref 0–55)
ANION GAP: 13 meq/L — AB (ref 3–11)
AST: 21 U/L (ref 5–34)
Albumin: 3.9 g/dL (ref 3.5–5.0)
Alkaline Phosphatase: 83 U/L (ref 40–150)
BILIRUBIN TOTAL: 1.31 mg/dL — AB (ref 0.20–1.20)
BUN: 7.4 mg/dL (ref 7.0–26.0)
CHLORIDE: 104 meq/L (ref 98–109)
CO2: 22 mEq/L (ref 22–29)
Calcium: 9.4 mg/dL (ref 8.4–10.4)
Creatinine: 0.8 mg/dL (ref 0.6–1.1)
Glucose: 173 mg/dl — ABNORMAL HIGH (ref 70–140)
POTASSIUM: 4.1 meq/L (ref 3.5–5.1)
Sodium: 139 mEq/L (ref 136–145)
TOTAL PROTEIN: 7.1 g/dL (ref 6.4–8.3)

## 2013-02-03 LAB — RETICULOCYTES
Immature Retic Fract: 41 % — ABNORMAL HIGH (ref 1.60–10.00)
RBC: 3.18 10*6/uL — ABNORMAL LOW (ref 3.70–5.45)
RETIC CT ABS: 208.61 10*3/uL — AB (ref 33.70–90.70)
Retic %: 6.56 % — ABNORMAL HIGH (ref 0.70–2.10)

## 2013-02-03 LAB — LACTATE DEHYDROGENASE (CC13): LDH: 328 U/L — AB (ref 125–245)

## 2013-02-03 LAB — MORPHOLOGY: PLT EST: ADEQUATE

## 2013-02-03 LAB — CHCC SMEAR

## 2013-02-03 MED ORDER — SODIUM CHLORIDE 0.9 % IV SOLN
Freq: Once | INTRAVENOUS | Status: AC
  Administered 2013-02-03: 14:00:00 via INTRAVENOUS

## 2013-02-03 MED ORDER — DEXAMETHASONE SODIUM PHOSPHATE 20 MG/5ML IJ SOLN
INTRAMUSCULAR | Status: AC
  Filled 2013-02-03: qty 5

## 2013-02-03 MED ORDER — SODIUM CHLORIDE 0.9 % IV SOLN
80.0000 mg/m2 | Freq: Once | INTRAVENOUS | Status: AC
  Administered 2013-02-03: 162 mg via INTRAVENOUS
  Filled 2013-02-03: qty 27

## 2013-02-03 MED ORDER — DEXAMETHASONE SODIUM PHOSPHATE 20 MG/5ML IJ SOLN
20.0000 mg | Freq: Once | INTRAMUSCULAR | Status: AC
  Administered 2013-02-03: 20 mg via INTRAVENOUS

## 2013-02-03 MED ORDER — FAMOTIDINE IN NACL 20-0.9 MG/50ML-% IV SOLN
INTRAVENOUS | Status: AC
  Filled 2013-02-03: qty 50

## 2013-02-03 MED ORDER — DIPHENHYDRAMINE HCL 50 MG/ML IJ SOLN
50.0000 mg | Freq: Once | INTRAMUSCULAR | Status: AC
  Administered 2013-02-03: 50 mg via INTRAVENOUS

## 2013-02-03 MED ORDER — DIPHENHYDRAMINE HCL 50 MG/ML IJ SOLN
INTRAMUSCULAR | Status: AC
  Filled 2013-02-03: qty 1

## 2013-02-03 MED ORDER — ONDANSETRON 8 MG/50ML IVPB (CHCC)
8.0000 mg | Freq: Once | INTRAVENOUS | Status: AC
  Administered 2013-02-03: 8 mg via INTRAVENOUS

## 2013-02-03 MED ORDER — FAMOTIDINE IN NACL 20-0.9 MG/50ML-% IV SOLN
20.0000 mg | Freq: Once | INTRAVENOUS | Status: AC
  Administered 2013-02-03: 20 mg via INTRAVENOUS

## 2013-02-03 MED ORDER — SODIUM CHLORIDE 0.9 % IJ SOLN
10.0000 mL | INTRAMUSCULAR | Status: DC | PRN
  Administered 2013-02-03: 10 mL
  Filled 2013-02-03: qty 10

## 2013-02-03 MED ORDER — HEPARIN SOD (PORK) LOCK FLUSH 100 UNIT/ML IV SOLN
500.0000 [IU] | Freq: Once | INTRAVENOUS | Status: AC | PRN
  Administered 2013-02-03: 500 [IU]
  Filled 2013-02-03: qty 5

## 2013-02-03 MED ORDER — ONDANSETRON 8 MG/NS 50 ML IVPB
INTRAVENOUS | Status: AC
  Filled 2013-02-03: qty 8

## 2013-02-03 NOTE — Progress Notes (Signed)
[facility]  [phone] [phone]) [phone]  OFFICE PROGRESS NOTE    ID: [name]   DOB: 25-Jul-1970  MR#: [id number]  JYN#:[id number]   PCP: Jenny Reichmann, MD GI: Lucio Edward, M.D.   DIAGNOSIS:  Mary Dougherty is a 43 y.o. female diagnosed in 09/2012 with invasive ductal carcinoma of the right breast that is estrogen receptor positive, progesterone receptor positive, HER-2/neu negative.   PRIOR THERAPY: 1.  Patient went for her initial screening mammogram at the insistence of her sister. She was found to have a mass in the right breast. The ultrasound demonstrated a 1.4 cm area of concern. She had an MRI performed that revealed a 2.7 cm irregular area at the 6 o'clock location. There were no masses in the left breast no abnormal lymph nodes. Patient had a biopsy performed of the right breast that revealed an intermediate to high-grade invasive ductal carcinoma the tumor was estrogen receptor positive 99%, progesterone receptor positive 100%,  HER-2/neu negative with a Ki-67 of 70%.   2. received neoadjuvant chemotherapy with dose dense FEC (5-FU/epirubicin/Cytoxan) from 10/07/2012 through 12/16/12.  She started weekly Taxol on 12/30/12.    3.  Acute on chronic anemia.   CURRENT THERAPY: Weekly Taxol  INTERVAL HISTORY: Mary Dougherty is a  43 y.o. female who returns for evaluation prior to weekly Taxol.  She is due for week 6 of 12 planned doses of weekly Taxol today.  She is doing well today.  She denies fevers, chills, numbness, pain, nausea, vomiting, constipation, diarrhea, fatigue, or any further concerns.    MEDICAL HISTORY: Past Medical History  Diagnosis Date  . Anemia   . Thyroid disease   . Wears glasses   . Breast cancer 2014    ER+/PR+/Her2-  . Spherocytosis 1989    ALLERGIES:   No Known Allergies   MEDICATIONS:  Current Outpatient Prescriptions  Medication Sig Dispense Refill  . Cholecalciferol (VITAMIN D3)  1000 UNITS CAPS Take 1 capsule by mouth daily.      Marland Kitchen docusate sodium (COLACE) 100 MG capsule Take 100 mg by mouth 3 (three) times daily as needed.       . folic acid (FOLVITE) 865 MCG tablet Take 400 mcg by mouth 2 (two) times daily.       Marland Kitchen lidocaine-prilocaine (EMLA) cream Apply topically as needed.  30 g  6  . Prenatal Vit-Fe Fumarate-FA (PRENATAL MULTIVITAMIN) TABS tablet Take 1 tablet by mouth daily at 12 noon.      . valACYclovir (VALTREX) 500 MG tablet Take 500 mg by mouth daily.       . cetirizine (ZYRTEC) 10 MG tablet Take 10 mg by mouth daily.       No current facility-administered medications for this visit.    SURGICAL HISTORY:  Past Surgical History  Procedure Laterality Date  . Wisdom tooth extraction    . Portacath placement Left 09/20/2012    Procedure: INSERTION PORT-A-CATH;  Surgeon: [name], MD;  Location: [facility];  Service: General;  Laterality: Left;    REVIEW OF SYSTEMS:  A 10 point review of systems was completed and is negative except as stated above.    PHYSICAL EXAMINATION: Blood pressure 127/82, pulse 116, temperature 98.6 F (37 C), temperature source Oral, resp. rate 18, height $RemoveBe'5\' 5"'rTMeTJOZx$  (1.651 m), weight 197 lb 2 oz (89.415 kg). Body mass index is 32.8 kg/(m^2). GENERAL: Patient is a well appearing female in no acute distress HEENT:  Sclerae anicteric.  Oropharynx clear and moist. No ulcerations or evidence of oropharyngeal candidiasis. Neck is supple.  NODES:  No cervical, supraclavicular, or axillary lymphadenopathy palpated.  BREAST EXAM: right breast nodule at 6 is softer,a bout 1cm LUNGS:  Clear to auscultation bilaterally.  No wheezes or rhonchi. HEART:  Regular rate and rhythm. No murmur appreciated. ABDOMEN:  Soft, nontender.  Positive, normoactive bowel sounds. No organomegaly palpated. MSK:  No focal spinal tenderness to palpation. Full range of motion bilaterally in the upper extremities. EXTREMITIES:  No peripheral  edema.   SKIN:  Clear with no obvious rashes or skin changes. No nail dyscrasia. NEURO:  Nonfocal. Well oriented.  Appropriate affect. ECOG FS: 1 - Symptomatic but completely ambulatory  LABORATORY DATA: Lab Results  Component Value Date   WBC 11.4* 02/03/2013   HGB 9.2* 02/03/2013   HCT 27.2* 02/03/2013   MCV 86.3 02/03/2013   PLT 238 02/03/2013      Chemistry      Component Value Date/Time   NA 139 02/03/2013 1122   NA 136 10/26/2012 0918   K 4.1 02/03/2013 1122   K 3.3* 10/26/2012 0918   CL 101 10/26/2012 0918   CO2 22 02/03/2013 1122   CO2 27 10/26/2012 0918   BUN 7.4 02/03/2013 1122   BUN 15 10/26/2012 0918   CREATININE 0.8 02/03/2013 1122   CREATININE 0.60 10/26/2012 0918      Component Value Date/Time   CALCIUM 9.4 02/03/2013 1122   CALCIUM 8.4 10/26/2012 0918   ALKPHOS 83 02/03/2013 1122   ALKPHOS 118* 10/26/2012 0918   AST 21 02/03/2013 1122   AST 12 10/26/2012 0918   ALT 28 02/03/2013 1122   ALT 18 10/26/2012 0918   BILITOT 1.31* 02/03/2013 1122   BILITOT 0.6 10/26/2012 0918       RADIOGRAPHIC STUDIES: No results found.   ASSESSMENT:   Mary Dougherty is a 43 y.o. female with:   #1 First ever mammogram found an abnormal mass in the right breast.  Ultrasound confirmed the mass to be 1.4 cm at the 6 o'clock position.   MRI revealed the mass to be a little bit bigger at 2.7 cm. Biopsy showed an invasive ductal carcinoma, grade 2, estrogen receptor/progesterone receptor positive, HER-2/neu negative, with a Ki-67 of 70%.   #2  Patient desires breast conservation and started receiving neoadjuvant chemotherapy consisting of Q2 week dose dense FEC for a total of 6 cycles.  This will be followed by Taxol weekly for a total of 12 weeks. Risks benefits and side effects of this treatment were discussed with the patient.   #3  After definitive surgery, the plan is for the patient to receive radiation therapy adjuvantly under the direction of Dr. Isidore Moos.  #4  Patient will need antiestrogen therapy  after radiation completion because her tumor is estrogen receptor positive.  Since she has not been without a menses for one year, therefore our plan is to proceed with antiestrogen therapy of Tamoxifen for 10 years.   #5 Acute on chronic anemia and GI bleeding (which has essentially resolved by mid November, 2014) - patient completed blood transfusion of 2 units of PRBCs on 12/09/2012 and consult with gastroenterologist Lucio Edward, M.D on 12/12/2012 .  She also received an additional two units of PRBCs on 01/14/13.  Patient has a blast in her peripheral blood and this was first noticed on 12/26 and then again today 02/03/13.    PLAN:  #1 Patient is doing well today.  Her CBC is stable.  She will proceed with chemotherapy today.  I reviewed her labs including a Tbili of 1.31 and her morphology with Dr. Marko Plume today.  Dr. Marko Plume plans on looking at the smear in the lab today as well.   #2  Patient will return in one week for labs, evaluation, and week 7 of weekly taxol.    #3 I ordered the MRI and requested it to be scheduled today.    All questions answered. Ms. Farve and her sister were encouraged to contact us in the interim with any questions, concerns, or problems.   I spent 25 minutes counseling the patient face to face.  The total time spent in the appointment was 30 minutes.  Minette Headland, [place] [phone] 02/03/2013    1:37 PM

## 2013-02-03 NOTE — Patient Instructions (Signed)
[facility] Discharge Instructions for Patients Receiving Chemotherapy  Today you received the following chemotherapy agents :  Taxol.  To help prevent nausea and vomiting after your treatment, we encourage you to take your nausea medication as instructed by your physician.   If you develop nausea and vomiting that is not controlled by your nausea medication, call the clinic.   BELOW ARE SYMPTOMS THAT SHOULD BE REPORTED IMMEDIATELY:  *FEVER GREATER THAN 100.5 F  *CHILLS WITH OR WITHOUT FEVER  NAUSEA AND VOMITING THAT IS NOT CONTROLLED WITH YOUR NAUSEA MEDICATION  *UNUSUAL SHORTNESS OF BREATH  *UNUSUAL BRUISING OR BLEEDING  TENDERNESS IN MOUTH AND THROAT WITH OR WITHOUT PRESENCE OF ULCERS  *URINARY PROBLEMS  *BOWEL PROBLEMS  UNUSUAL RASH Items with * indicate a potential emergency and should be followed up as soon as possible.  Feel free to call the clinic you have any questions or concerns. The clinic phone number is [phone].    

## 2013-02-03 NOTE — Telephone Encounter (Signed)
, °

## 2013-02-03 NOTE — Telephone Encounter (Signed)
Per staff message and POF I have scheduled appts.  JMW  

## 2013-02-04 LAB — DIRECT ANTIGLOBULIN TEST (NOT AT ARMC)
DAT (Complement): NEGATIVE
DAT IgG: NEGATIVE

## 2013-02-04 LAB — HAPTOGLOBIN: Haptoglobin: 39 mg/dL — ABNORMAL LOW (ref 45–215)

## 2013-02-08 ENCOUNTER — Ambulatory Visit: Payer: BC Managed Care – PPO | Admitting: Oncology

## 2013-02-08 ENCOUNTER — Other Ambulatory Visit: Payer: BC Managed Care – PPO

## 2013-02-10 ENCOUNTER — Ambulatory Visit (HOSPITAL_BASED_OUTPATIENT_CLINIC_OR_DEPARTMENT_OTHER): Payer: BC Managed Care – PPO

## 2013-02-10 ENCOUNTER — Other Ambulatory Visit (HOSPITAL_BASED_OUTPATIENT_CLINIC_OR_DEPARTMENT_OTHER): Payer: BC Managed Care – PPO

## 2013-02-10 ENCOUNTER — Encounter: Payer: Self-pay | Admitting: Adult Health

## 2013-02-10 ENCOUNTER — Ambulatory Visit (HOSPITAL_BASED_OUTPATIENT_CLINIC_OR_DEPARTMENT_OTHER): Payer: BC Managed Care – PPO | Admitting: Adult Health

## 2013-02-10 ENCOUNTER — Telehealth: Payer: Self-pay | Admitting: Oncology

## 2013-02-10 ENCOUNTER — Ambulatory Visit (HOSPITAL_COMMUNITY)
Admission: RE | Admit: 2013-02-10 | Discharge: 2013-02-10 | Disposition: A | Discharge status: Home / Self Care | Payer: BC Managed Care – PPO | Source: Ambulatory Visit | Attending: Oncology | Admitting: Oncology

## 2013-02-10 DIAGNOSIS — C50511 Malignant neoplasm of lower-outer quadrant of right female breast: Secondary | ICD-10-CM

## 2013-02-10 DIAGNOSIS — C50519 Malignant neoplasm of lower-outer quadrant of unspecified female breast: Secondary | ICD-10-CM | POA: Insufficient documentation

## 2013-02-10 DIAGNOSIS — C50919 Malignant neoplasm of unspecified site of unspecified female breast: Secondary | ICD-10-CM

## 2013-02-10 DIAGNOSIS — D649 Anemia, unspecified: Secondary | ICD-10-CM

## 2013-02-10 DIAGNOSIS — K922 Gastrointestinal hemorrhage, unspecified: Secondary | ICD-10-CM

## 2013-02-10 DIAGNOSIS — Z5111 Encounter for antineoplastic chemotherapy: Secondary | ICD-10-CM

## 2013-02-10 DIAGNOSIS — D6489 Other specified anemias: Secondary | ICD-10-CM

## 2013-02-10 LAB — CBC WITH DIFFERENTIAL/PLATELET
BASO%: 0.6 % (ref 0.0–2.0)
BASOS ABS: 0.1 10*3/uL (ref 0.0–0.1)
EOS%: 1.5 % (ref 0.0–7.0)
Eosinophils Absolute: 0.2 10*3/uL (ref 0.0–0.5)
HEMATOCRIT: 23.6 % — AB (ref 34.8–46.6)
HEMOGLOBIN: 8 g/dL — AB (ref 11.6–15.9)
LYMPH%: 14.3 % (ref 14.0–49.7)
MCH: 30 pg (ref 25.1–34.0)
MCHC: 33.9 g/dL (ref 31.5–36.0)
MCV: 88.4 fL (ref 79.5–101.0)
MONO#: 0.7 10*3/uL (ref 0.1–0.9)
MONO%: 6.3 % (ref 0.0–14.0)
NEUT#: 8.5 10*3/uL — ABNORMAL HIGH (ref 1.5–6.5)
NEUT%: 77.3 % — AB (ref 38.4–76.8)
PLATELETS: 250 10*3/uL (ref 145–400)
RBC: 2.67 10*6/uL — ABNORMAL LOW (ref 3.70–5.45)
RDW: 23.2 % — ABNORMAL HIGH (ref 11.2–14.5)
WBC: 11 10*3/uL — ABNORMAL HIGH (ref 3.9–10.3)
lymph#: 1.6 10*3/uL (ref 0.9–3.3)

## 2013-02-10 LAB — RETICULOCYTES
Immature Retic Fract: 42.3 % — ABNORMAL HIGH (ref 1.60–10.00)
RBC: 2.71 10*6/uL — ABNORMAL LOW (ref 3.70–5.45)
RETIC %: 6.94 % — AB (ref 0.70–2.10)
Retic Ct Abs: 188.07 10*3/uL — ABNORMAL HIGH (ref 33.70–90.70)

## 2013-02-10 LAB — COMPREHENSIVE METABOLIC PANEL (CC13)
ALBUMIN: 3.8 g/dL (ref 3.5–5.0)
ALT: 26 U/L (ref 0–55)
AST: 22 U/L (ref 5–34)
Alkaline Phosphatase: 77 U/L (ref 40–150)
Anion Gap: 9 mEq/L (ref 3–11)
BUN: 6.7 mg/dL — AB (ref 7.0–26.0)
CHLORIDE: 105 meq/L (ref 98–109)
CO2: 26 mEq/L (ref 22–29)
Calcium: 9.3 mg/dL (ref 8.4–10.4)
Creatinine: 0.7 mg/dL (ref 0.6–1.1)
Glucose: 106 mg/dl (ref 70–140)
POTASSIUM: 4.1 meq/L (ref 3.5–5.1)
Sodium: 140 mEq/L (ref 136–145)
Total Bilirubin: 1.14 mg/dL (ref 0.20–1.20)
Total Protein: 6.6 g/dL (ref 6.4–8.3)

## 2013-02-10 LAB — MORPHOLOGY
PLT EST: ADEQUATE
RBC Comments: 3

## 2013-02-10 LAB — LACTATE DEHYDROGENASE (CC13): LDH: 290 U/L — ABNORMAL HIGH (ref 125–245)

## 2013-02-10 LAB — IRON AND TIBC CHCC
%SAT: 28 % (ref 21–57)
Iron: 61 ug/dL (ref 41–142)
TIBC: 218 ug/dL — AB (ref 236–444)
UIBC: 156 ug/dL (ref 120–384)

## 2013-02-10 LAB — FERRITIN CHCC: Ferritin: 660 ng/ml — ABNORMAL HIGH (ref 9–269)

## 2013-02-10 LAB — CHCC SMEAR

## 2013-02-10 MED ORDER — SODIUM CHLORIDE 0.9 % IJ SOLN
10.0000 mL | INTRAMUSCULAR | Status: DC | PRN
  Administered 2013-02-10: 10 mL
  Filled 2013-02-10: qty 10

## 2013-02-10 MED ORDER — DEXAMETHASONE SODIUM PHOSPHATE 20 MG/5ML IJ SOLN
INTRAMUSCULAR | Status: AC
  Filled 2013-02-10: qty 5

## 2013-02-10 MED ORDER — FAMOTIDINE IN NACL 20-0.9 MG/50ML-% IV SOLN
20.0000 mg | Freq: Once | INTRAVENOUS | Status: AC
  Administered 2013-02-10: 20 mg via INTRAVENOUS

## 2013-02-10 MED ORDER — DIPHENHYDRAMINE HCL 50 MG/ML IJ SOLN
50.0000 mg | Freq: Once | INTRAMUSCULAR | Status: AC
  Administered 2013-02-10: 50 mg via INTRAVENOUS

## 2013-02-10 MED ORDER — SODIUM CHLORIDE 0.9 % IV SOLN
Freq: Once | INTRAVENOUS | Status: AC
  Administered 2013-02-10: 15:00:00 via INTRAVENOUS

## 2013-02-10 MED ORDER — FAMOTIDINE IN NACL 20-0.9 MG/50ML-% IV SOLN
INTRAVENOUS | Status: AC
  Filled 2013-02-10: qty 50

## 2013-02-10 MED ORDER — ONDANSETRON 8 MG/50ML IVPB (CHCC)
8.0000 mg | Freq: Once | INTRAVENOUS | Status: AC
  Administered 2013-02-10: 8 mg via INTRAVENOUS

## 2013-02-10 MED ORDER — SODIUM CHLORIDE 0.9 % IV SOLN
80.0000 mg/m2 | Freq: Once | INTRAVENOUS | Status: AC
  Administered 2013-02-10: 162 mg via INTRAVENOUS
  Filled 2013-02-10: qty 27

## 2013-02-10 MED ORDER — DIPHENHYDRAMINE HCL 50 MG/ML IJ SOLN
INTRAMUSCULAR | Status: AC
  Filled 2013-02-10: qty 1

## 2013-02-10 MED ORDER — ONDANSETRON 8 MG/NS 50 ML IVPB
INTRAVENOUS | Status: AC
  Filled 2013-02-10: qty 8

## 2013-02-10 MED ORDER — DEXAMETHASONE SODIUM PHOSPHATE 20 MG/5ML IJ SOLN
20.0000 mg | Freq: Once | INTRAMUSCULAR | Status: AC
  Administered 2013-02-10: 20 mg via INTRAVENOUS

## 2013-02-10 MED ORDER — HEPARIN SOD (PORK) LOCK FLUSH 100 UNIT/ML IV SOLN
500.0000 [IU] | Freq: Once | INTRAVENOUS | Status: AC | PRN
  Administered 2013-02-10: 500 [IU]
  Filled 2013-02-10: qty 5

## 2013-02-10 NOTE — Telephone Encounter (Signed)
pt sent back to [facility] and given appt schedule for january thru february including appt w/Dr. Donne Hazel for 1/29. pt aware of blood tomorrow - ok to add per maggie.

## 2013-02-10 NOTE — Progress Notes (Addendum)
[facility]  Telephone:(336) [phone] Fax:(336) [phone]  OFFICE PROGRESS NOTE    ID: [name]   DOB: [birth year]  MR#: [id number]  JME#:[id number]   PCP: Jenny Reichmann, MD GI: Lucio Edward, M.D.   DIAGNOSIS:  Mary Dougherty is a 43 y.o. female diagnosed in 09/2012 with invasive ductal carcinoma of the right breast that is estrogen receptor positive, progesterone receptor positive, HER-2/neu negative.   PRIOR THERAPY: 1.  Patient went for her initial screening mammogram at the insistence of her sister. She was found to have a mass in the right breast. The ultrasound demonstrated a 1.4 cm area of concern. She had an MRI performed that revealed a 2.7 cm irregular area at the 6 o'clock location. There were no masses in the left breast no abnormal lymph nodes. Patient had a biopsy performed of the right breast that revealed an intermediate to high-grade invasive ductal carcinoma the tumor was estrogen receptor positive 99%, progesterone receptor positive 100%,  HER-2/neu negative with a Ki-67 of 70%.   2. received neoadjuvant chemotherapy with dose dense FEC (5-FU/epirubicin/Cytoxan) from 10/07/2012 through 12/16/12.  She started weekly Taxol on 12/30/12.  She is due to complete chemotherapy on 03/17/13.    3.  Acute on chronic anemia.  Her previous 2-3 CBCs have had 1-2 blasts and NRBCs on them.  A work up is underway.  Patient informs [place] that she has previously been diagnosed with hereditary spherocytosis.     CURRENT THERAPY: Weekly Taxol  INTERVAL HISTORY: Mary Dougherty is a  43 y.o. female who returns for evaluation prior to weekly Taxol.  She is due for week 7 of her weekly Taxol.  She is doing well today.  She is slightly fatigued, but otherwise denies fevers, chills, nausea, vomiting, skin changes, constipation, diarrhea, dysuria, cough, shortness of breath, DOE, palpitations, numbness/neuropathy, or any further concerns.    MEDICAL HISTORY: Past  Medical History  Diagnosis Date  . Anemia   . Thyroid disease   . Wears glasses   . Breast cancer 2014    ER+/PR+/Her2-  . Spherocytosis 1989    ALLERGIES:   No Known Allergies   MEDICATIONS:  Current Outpatient Prescriptions  Medication Sig Dispense Refill  . Cholecalciferol (VITAMIN D3) 1000 UNITS CAPS Take 1 capsule by mouth daily.      Marland Kitchen docusate sodium (COLACE) 100 MG capsule Take 100 mg by mouth 3 (three) times daily as needed.       . folic acid (FOLVITE) 229 MCG tablet Take 400 mcg by mouth daily.       Marland Kitchen lidocaine-prilocaine (EMLA) cream Apply topically as needed.  30 g  6  . Multiple Vitamins-Minerals (AIRBORNE PO) Take by mouth. Take 1 PO daily with meal.      . Prenatal Vit-Fe Fumarate-FA (PRENATAL MULTIVITAMIN) TABS tablet Take 1 tablet by mouth daily at 12 noon.      . valACYclovir (VALTREX) 500 MG tablet Take 500 mg by mouth daily.       . cetirizine (ZYRTEC) 10 MG tablet Take 10 mg by mouth daily.       No current facility-administered medications for this visit.    SURGICAL HISTORY:  Past Surgical History  Procedure Laterality Date  . Wisdom tooth extraction    . Portacath placement Left 09/20/2012    Procedure: INSERTION PORT-A-CATH;  Surgeon: [name], MD;  Location: [facility];  Service: General;  Laterality: Left;    REVIEW OF SYSTEMS:  A 10 point review of systems was completed and is negative except as stated above.    PHYSICAL EXAMINATION: There were no vitals taken for this visit. There is no weight on file to calculate BMI. GENERAL: Patient is a well appearing female in no acute distress HEENT:  Sclerae anicteric.  Oropharynx clear and moist. No ulcerations or evidence of oropharyngeal candidiasis. Neck is supple.  NODES:  No cervical, supraclavicular, or axillary lymphadenopathy palpated.  BREAST EXAM: right breast nodule at 6 is softer,a bout 1cm LUNGS:  Clear to auscultation bilaterally.  No wheezes or rhonchi. HEART:   Regular rate and rhythm. No murmur appreciated. ABDOMEN:  Soft, nontender.  Positive, normoactive bowel sounds. No organomegaly palpated. MSK:  No focal spinal tenderness to palpation. Full range of motion bilaterally in the upper extremities. EXTREMITIES:  No peripheral edema.   SKIN:  Clear with no obvious rashes or skin changes. No nail dyscrasia. NEURO:  Nonfocal. Well oriented.  Appropriate affect. ECOG FS: 1 - Symptomatic but completely ambulatory  LABORATORY DATA: Lab Results  Component Value Date   WBC 11.0* 02/10/2013   HGB 8.0* 02/10/2013   HCT 23.6* 02/10/2013   MCV 88.4 02/10/2013   PLT 250 02/10/2013      Chemistry      Component Value Date/Time   NA 140 02/10/2013 1140   NA 136 10/26/2012 0918   K 4.1 02/10/2013 1140   K 3.3* 10/26/2012 0918   CL 101 10/26/2012 0918   CO2 26 02/10/2013 1140   CO2 27 10/26/2012 0918   BUN 6.7* 02/10/2013 1140   BUN 15 10/26/2012 0918   CREATININE 0.7 02/10/2013 1140   CREATININE 0.60 10/26/2012 0918      Component Value Date/Time   CALCIUM 9.3 02/10/2013 1140   CALCIUM 8.4 10/26/2012 0918   ALKPHOS 77 02/10/2013 1140   ALKPHOS 118* 10/26/2012 0918   AST 22 02/10/2013 1140   AST 12 10/26/2012 0918   ALT 26 02/10/2013 1140   ALT 18 10/26/2012 0918   BILITOT 1.14 02/10/2013 1140   BILITOT 0.6 10/26/2012 0918       RADIOGRAPHIC STUDIES: No results found.   ASSESSMENT:   [name] is a 43 y.o. female with:   #1 First ever mammogram found an abnormal mass in the right breast.  Ultrasound confirmed the mass to be 1.4 cm at the 6 o'clock position.   MRI revealed the mass to be a little bit bigger at 2.7 cm. Biopsy showed an invasive ductal carcinoma, grade 2, estrogen receptor/progesterone receptor positive, HER-2/neu negative, with a Ki-67 of 70%.   #2  Patient desires breast conservation and started receiving neoadjuvant chemotherapy consisting of Q2 week dose dense FEC for a total of 6 cycles.  This will be followed by Taxol weekly for a total of 12  weeks. Risks benefits and side effects of this treatment were discussed with the patient.   #3  After definitive surgery, the plan is for the patient to receive radiation therapy adjuvantly under the direction of Dr. Isidore Moos.  #4  Patient will need antiestrogen therapy after radiation completion because her tumor is estrogen receptor positive.  Since she has not been without a menses for one year, therefore our plan is to proceed with antiestrogen therapy of Tamoxifen for 10 years.   #5 Acute on chronic anemia and GI bleeding (which has essentially resolved by mid November, 2014) - patient completed blood transfusion of 2 units of PRBCs on 12/09/2012 and consult with gastroenterologist  Lucio Edward, M.D on 12/12/2012 .  She also received an additional two units of PRBCs on 01/14/13.  Patient has a blast in her peripheral blood and this was first noticed on 12/26 and then again on 02/03/13.  She has no blasts on today's morphology.  She does inform us that she has a h/o hereditary spherocytosis.    PLAN:  #1  Ms. Farias is doing essentially well today.  She continues to tolerate her weekly Taxol well.  She will proceed with week 7 of therapy today.    #2  I reviewed her weekly labs with her.  Her hemoglobin is 8.  Due to the fact that she is receiving chemotherapy today, we will transfuse her with 2 units of PRBCs tomorrow.    #3  She will return in one week for labs and evaluation along with week 8 of her Taxol chemotherapy.  We will continue to monitor closely for signs of neuropathy.    #4  She has scheduled an MRI of the breasts on 02/27/13 and f/u with Dr. Donne Hazel on 03/02/13.    All questions answered. Ms. Deangelo and her sister were encouraged to contact us in the interim with any questions, concerns, or problems.   I spent 25 minutes counseling the patient face to face.  The total time spent in the appointment was 30 minutes.  Minette Headland, [place] [phone] 02/13/2013    8:40 AM  ATTENDING'S ATTESTATION:  I personally reviewed patient's chart, examined patient myself, formulated the treatment plan as followed.    Patient with looks like hemolytic anemia. She has a long standing history. And has prior work up. Her family history is positive for anemia. She will bring prior medical records for my review. We will also proceed with hemolytic work up.  Otherwise she will continue present chemotherapy.  Marcy Panning, MD Medical/Oncology [facility] [phone] (beeper) [phone] (Office)  02/25/2013, 2:47 PM

## 2013-02-11 ENCOUNTER — Ambulatory Visit (HOSPITAL_BASED_OUTPATIENT_CLINIC_OR_DEPARTMENT_OTHER): Payer: BC Managed Care – PPO

## 2013-02-11 VITALS — BP 107/48 | HR 79 | Temp 98.1°F | Resp 18

## 2013-02-11 DIAGNOSIS — D638 Anemia in other chronic diseases classified elsewhere: Secondary | ICD-10-CM

## 2013-02-11 DIAGNOSIS — C50511 Malignant neoplasm of lower-outer quadrant of right female breast: Secondary | ICD-10-CM

## 2013-02-11 DIAGNOSIS — C50919 Malignant neoplasm of unspecified site of unspecified female breast: Secondary | ICD-10-CM

## 2013-02-11 LAB — PREPARE RBC (CROSSMATCH)

## 2013-02-11 MED ORDER — SODIUM CHLORIDE 0.9 % IJ SOLN
10.0000 mL | INTRAMUSCULAR | Status: AC | PRN
  Administered 2013-02-11: 10 mL
  Filled 2013-02-11: qty 10

## 2013-02-11 MED ORDER — ACETAMINOPHEN 325 MG PO TABS
650.0000 mg | ORAL_TABLET | Freq: Once | ORAL | Status: AC
  Administered 2013-02-11: 650 mg via ORAL

## 2013-02-11 MED ORDER — DIPHENHYDRAMINE HCL 25 MG PO CAPS
25.0000 mg | ORAL_CAPSULE | Freq: Once | ORAL | Status: AC
  Administered 2013-02-11: 25 mg via ORAL

## 2013-02-11 MED ORDER — ACETAMINOPHEN 325 MG PO TABS
ORAL_TABLET | ORAL | Status: AC
  Filled 2013-02-11: qty 2

## 2013-02-11 MED ORDER — HEPARIN SOD (PORK) LOCK FLUSH 100 UNIT/ML IV SOLN
500.0000 [IU] | Freq: Every day | INTRAVENOUS | Status: AC | PRN
  Administered 2013-02-11: 500 [IU]
  Filled 2013-02-11: qty 5

## 2013-02-11 MED ORDER — SODIUM CHLORIDE 0.9 % IV SOLN
250.0000 mL | Freq: Once | INTRAVENOUS | Status: AC
  Administered 2013-02-11: 250 mL via INTRAVENOUS

## 2013-02-11 MED ORDER — DIPHENHYDRAMINE HCL 25 MG PO CAPS
ORAL_CAPSULE | ORAL | Status: AC
  Filled 2013-02-11: qty 1

## 2013-02-11 NOTE — Patient Instructions (Signed)
Blood Transfusion Information WHAT IS A BLOOD TRANSFUSION? A transfusion is the replacement of blood or some of its parts. Blood is made up of multiple cells which provide different functions.  Red blood cells carry oxygen and are used for blood loss replacement.  White blood cells fight against infection.  Platelets control bleeding.  Plasma helps clot blood.  Other blood products are available for specialized needs, such as hemophilia or other clotting disorders. BEFORE THE TRANSFUSION  Who gives blood for transfusions?   You may be able to donate blood to be used at a later date on yourself (autologous donation).  Relatives can be asked to donate blood. This is generally not any safer than if you have received blood from a stranger. The same precautions are taken to ensure safety when a relative's blood is donated.  Healthy volunteers who are fully evaluated to make sure their blood is safe. This is blood bank blood. Transfusion therapy is the safest it has ever been in the practice of medicine. Before blood is taken from a donor, a complete history is taken to make sure that person has no history of diseases nor engages in risky social behavior (examples are intravenous drug use or sexual activity with multiple partners). The donor's travel history is screened to minimize risk of transmitting infections, such as malaria. The donated blood is tested for signs of infectious diseases, such as HIV and hepatitis. The blood is then tested to be sure it is compatible with you in order to minimize the chance of a transfusion reaction. If you or a relative donates blood, this is often done in anticipation of surgery and is not appropriate for emergency situations. It takes many days to process the donated blood. RISKS AND COMPLICATIONS Although transfusion therapy is very safe and saves many lives, the main dangers of transfusion include:   Getting an infectious disease.  Developing a  transfusion reaction. This is an allergic reaction to something in the blood you were given. Every precaution is taken to prevent this. The decision to have a blood transfusion has been considered carefully by your caregiver before blood is given. Blood is not given unless the benefits outweigh the risks. AFTER THE TRANSFUSION  Right after receiving a blood transfusion, you will usually feel much better and more energetic. This is especially true if your red blood cells have gotten low (anemic). The transfusion raises the level of the red blood cells which carry oxygen, and this usually causes an energy increase.  The nurse administering the transfusion will monitor you carefully for complications. HOME CARE INSTRUCTIONS  No special instructions are needed after a transfusion. You may find your energy is better. Speak with your caregiver about any limitations on activity for underlying diseases you may have. SEEK MEDICAL CARE IF:   Your condition is not improving after your transfusion.  You develop redness or irritation at the intravenous (IV) site. SEEK IMMEDIATE MEDICAL CARE IF:  Any of the following symptoms occur over the next 12 hours:  Shaking chills.  You have a temperature by mouth above 102 F (38.9 C), not controlled by medicine.  Chest, back, or muscle pain.  People around you feel you are not acting correctly or are confused.  Shortness of breath or difficulty breathing.  Dizziness and fainting.  You get a rash or develop hives.  You have a decrease in urine output.  Your urine turns a dark color or changes to pink, red, or brown. Any of the following   symptoms occur over the next 10 days:  You have a temperature by mouth above 102 F (38.9 C), not controlled by medicine.  Shortness of breath.  Weakness after normal activity.  The white part of the eye turns yellow (jaundice).  You have a decrease in the amount of urine or are urinating less often.  Your  urine turns a dark color or changes to pink, red, or brown. Document Released: 01/17/2000 Document Revised: 04/13/2011 Document Reviewed: 09/05/2007 Exit[employer] Patient Information 2014 [employer].  

## 2013-02-13 LAB — TYPE AND SCREEN
ABO/RH(D): B POS
ANTIBODY SCREEN: NEGATIVE
UNIT DIVISION: 0
Unit division: 0

## 2013-02-14 LAB — DIRECT ANTIGLOBULIN TEST (NOT AT ARMC)
DAT (Complement): NEGATIVE
DAT IgG: NEGATIVE

## 2013-02-14 LAB — HAPTOGLOBIN: HAPTOGLOBIN: 29 mg/dL — AB (ref 45–215)

## 2013-02-14 LAB — HEMOGLOBINOPATHY EVALUATION
HEMOGLOBIN OTHER: 0 %
HGB A2 QUANT: 2.3 % (ref 2.2–3.2)
HGB F QUANT: 0.4 % (ref 0.0–2.0)
Hgb A: 97.3 % (ref 96.8–97.8)
Hgb S Quant: 0 %

## 2013-02-17 ENCOUNTER — Ambulatory Visit (HOSPITAL_BASED_OUTPATIENT_CLINIC_OR_DEPARTMENT_OTHER): Payer: BC Managed Care – PPO | Admitting: Adult Health

## 2013-02-17 ENCOUNTER — Other Ambulatory Visit (HOSPITAL_BASED_OUTPATIENT_CLINIC_OR_DEPARTMENT_OTHER): Payer: BC Managed Care – PPO

## 2013-02-17 ENCOUNTER — Encounter: Payer: Self-pay | Admitting: Adult Health

## 2013-02-17 ENCOUNTER — Encounter: Payer: Self-pay | Admitting: Oncology

## 2013-02-17 ENCOUNTER — Ambulatory Visit (HOSPITAL_BASED_OUTPATIENT_CLINIC_OR_DEPARTMENT_OTHER): Payer: BC Managed Care – PPO

## 2013-02-17 VITALS — BP 129/74 | HR 85 | Temp 97.8°F | Resp 18 | Ht 65.0 in | Wt 198.4 lb

## 2013-02-17 DIAGNOSIS — Z862 Personal history of diseases of the blood and blood-forming organs and certain disorders involving the immune mechanism: Secondary | ICD-10-CM

## 2013-02-17 DIAGNOSIS — D649 Anemia, unspecified: Secondary | ICD-10-CM

## 2013-02-17 DIAGNOSIS — D638 Anemia in other chronic diseases classified elsewhere: Secondary | ICD-10-CM

## 2013-02-17 DIAGNOSIS — C50919 Malignant neoplasm of unspecified site of unspecified female breast: Secondary | ICD-10-CM

## 2013-02-17 DIAGNOSIS — Z170 Estrogen receptor positive status [ER+]: Secondary | ICD-10-CM

## 2013-02-17 DIAGNOSIS — C50511 Malignant neoplasm of lower-outer quadrant of right female breast: Secondary | ICD-10-CM

## 2013-02-17 DIAGNOSIS — Z5111 Encounter for antineoplastic chemotherapy: Secondary | ICD-10-CM

## 2013-02-17 LAB — COMPREHENSIVE METABOLIC PANEL (CC13)
ALBUMIN: 3.8 g/dL (ref 3.5–5.0)
ALT: 22 U/L (ref 0–55)
AST: 18 U/L (ref 5–34)
Alkaline Phosphatase: 74 U/L (ref 40–150)
Anion Gap: 10 mEq/L (ref 3–11)
BUN: 7.4 mg/dL (ref 7.0–26.0)
CALCIUM: 9.3 mg/dL (ref 8.4–10.4)
CHLORIDE: 107 meq/L (ref 98–109)
CO2: 25 meq/L (ref 22–29)
Creatinine: 0.7 mg/dL (ref 0.6–1.1)
Glucose: 134 mg/dl (ref 70–140)
POTASSIUM: 4.1 meq/L (ref 3.5–5.1)
Sodium: 142 mEq/L (ref 136–145)
Total Bilirubin: 1.04 mg/dL (ref 0.20–1.20)
Total Protein: 6.5 g/dL (ref 6.4–8.3)

## 2013-02-17 LAB — CBC WITH DIFFERENTIAL/PLATELET
BASO%: 0.4 % (ref 0.0–2.0)
Basophils Absolute: 0 10*3/uL (ref 0.0–0.1)
EOS%: 1.9 % (ref 0.0–7.0)
Eosinophils Absolute: 0.2 10*3/uL (ref 0.0–0.5)
HCT: 29.3 % — ABNORMAL LOW (ref 34.8–46.6)
HGB: 9.7 g/dL — ABNORMAL LOW (ref 11.6–15.9)
LYMPH#: 1.2 10*3/uL (ref 0.9–3.3)
LYMPH%: 14.2 % (ref 14.0–49.7)
MCH: 27.8 pg (ref 25.1–34.0)
MCHC: 33.1 g/dL (ref 31.5–36.0)
MCV: 84 fL (ref 79.5–101.0)
MONO#: 0.3 10*3/uL (ref 0.1–0.9)
MONO%: 3.8 % (ref 0.0–14.0)
NEUT#: 6.7 10*3/uL — ABNORMAL HIGH (ref 1.5–6.5)
NEUT%: 79.7 % — ABNORMAL HIGH (ref 38.4–76.8)
Platelets: 200 10*3/uL (ref 145–400)
RBC: 3.49 10*6/uL — AB (ref 3.70–5.45)
RDW: 23.3 % — AB (ref 11.2–14.5)
WBC: 8.5 10*3/uL (ref 3.9–10.3)

## 2013-02-17 LAB — MORPHOLOGY: PLT EST: ADEQUATE

## 2013-02-17 LAB — CHCC SMEAR

## 2013-02-17 MED ORDER — DEXAMETHASONE SODIUM PHOSPHATE 20 MG/5ML IJ SOLN
20.0000 mg | Freq: Once | INTRAMUSCULAR | Status: AC
  Administered 2013-02-17: 20 mg via INTRAVENOUS

## 2013-02-17 MED ORDER — FAMOTIDINE IN NACL 20-0.9 MG/50ML-% IV SOLN
INTRAVENOUS | Status: AC
  Filled 2013-02-17: qty 50

## 2013-02-17 MED ORDER — DIPHENHYDRAMINE HCL 50 MG/ML IJ SOLN
50.0000 mg | Freq: Once | INTRAMUSCULAR | Status: AC
Start: 2013-02-17 — End: 2013-02-17
  Administered 2013-02-17: 50 mg via INTRAVENOUS

## 2013-02-17 MED ORDER — SODIUM CHLORIDE 0.9 % IJ SOLN
10.0000 mL | INTRAMUSCULAR | Status: DC | PRN
  Administered 2013-02-17: 10 mL
  Filled 2013-02-17: qty 10

## 2013-02-17 MED ORDER — SODIUM CHLORIDE 0.9 % IV SOLN
80.0000 mg/m2 | Freq: Once | INTRAVENOUS | Status: AC
  Administered 2013-02-17: 162 mg via INTRAVENOUS
  Filled 2013-02-17: qty 27

## 2013-02-17 MED ORDER — DIPHENHYDRAMINE HCL 50 MG/ML IJ SOLN
INTRAMUSCULAR | Status: AC
  Filled 2013-02-17: qty 1

## 2013-02-17 MED ORDER — DEXAMETHASONE SODIUM PHOSPHATE 20 MG/5ML IJ SOLN
INTRAMUSCULAR | Status: AC
  Filled 2013-02-17: qty 5

## 2013-02-17 MED ORDER — ONDANSETRON 8 MG/NS 50 ML IVPB
INTRAVENOUS | Status: AC
Start: 2013-02-17 — End: 2013-02-17
  Filled 2013-02-17: qty 8

## 2013-02-17 MED ORDER — ONDANSETRON 8 MG/50ML IVPB (CHCC)
8.0000 mg | Freq: Once | INTRAVENOUS | Status: AC
  Administered 2013-02-17: 8 mg via INTRAVENOUS

## 2013-02-17 MED ORDER — HEPARIN SOD (PORK) LOCK FLUSH 100 UNIT/ML IV SOLN
500.0000 [IU] | Freq: Once | INTRAVENOUS | Status: AC | PRN
  Administered 2013-02-17: 500 [IU]
  Filled 2013-02-17: qty 5

## 2013-02-17 MED ORDER — SODIUM CHLORIDE 0.9 % IV SOLN
Freq: Once | INTRAVENOUS | Status: AC
  Administered 2013-02-17: 10:00:00 via INTRAVENOUS

## 2013-02-17 MED ORDER — FAMOTIDINE IN NACL 20-0.9 MG/50ML-% IV SOLN
20.0000 mg | Freq: Once | INTRAVENOUS | Status: AC
  Administered 2013-02-17: 20 mg via INTRAVENOUS

## 2013-02-17 NOTE — Patient Instructions (Signed)
[facility] Discharge Instructions for Patients Receiving Chemotherapy  Today you received the following chemotherapy agents: Taxol.  To help prevent nausea and vomiting after your treatment, we encourage you to take your nausea medication as prescribed.   If you develop nausea and vomiting that is not controlled by your nausea medication, call the clinic.   BELOW ARE SYMPTOMS THAT SHOULD BE REPORTED IMMEDIATELY:  *FEVER GREATER THAN 100.5 F  *CHILLS WITH OR WITHOUT FEVER  NAUSEA AND VOMITING THAT IS NOT CONTROLLED WITH YOUR NAUSEA MEDICATION  *UNUSUAL SHORTNESS OF BREATH  *UNUSUAL BRUISING OR BLEEDING  TENDERNESS IN MOUTH AND THROAT WITH OR WITHOUT PRESENCE OF ULCERS  *URINARY PROBLEMS  *BOWEL PROBLEMS  UNUSUAL RASH Items with * indicate a potential emergency and should be followed up as soon as possible.  Feel free to call the clinic you have any questions or concerns. The clinic phone number is [phone].    

## 2013-02-17 NOTE — Progress Notes (Addendum)
Mary Dougherty  Telephone:(336) [phone] Fax:(336) [phone]  OFFICE PROGRESS NOTE    ID: Mary Dougherty   DOB: [birth year]  MR#: [id number]  KGY#:[id number]   PCP: Jenny Reichmann, MD GI: Lucio Edward, M.D.   DIAGNOSIS:  Mary Dougherty is a 43 y.o. female diagnosed in 09/2012 with invasive ductal carcinoma of the right breast that is estrogen receptor positive, progesterone receptor positive, HER-2/neu negative.   PRIOR THERAPY: 1.  Patient went for her initial screening mammogram at the insistence of her sister. She was found to have a mass in the right breast. The ultrasound demonstrated a 1.4 cm area of concern. She had an MRI performed that revealed a 2.7 cm irregular area at the 6 o'clock location. There were no masses in the left breast no abnormal lymph nodes. Patient had a biopsy performed of the right breast that revealed an intermediate to high-grade invasive ductal carcinoma the tumor was estrogen receptor positive 99%, progesterone receptor positive 100%,  HER-2/neu negative with a Ki-67 of 70%.   2. received neoadjuvant chemotherapy with dose dense FEC (5-FU/epirubicin/Cytoxan) from 10/07/2012 through 12/16/12.  She started weekly Taxol on 12/30/12.  She is due to complete chemotherapy on 03/17/13.    3.  Acute on chronic anemia.  A few of her previous CBCs have had 1-2 blasts and NRBCs on them.  A work up is underway.  Patient informs [place] that she has previously been diagnosed with hereditary spherocytosis.     CURRENT THERAPY: Weekly Taxol  INTERVAL HISTORY: [name] is a  43 y.o. female who returns for evaluation prior to weekly Taxol.  She is due for week 8 of her weekly Taxol.  She is doing well today.  She received 2 units of PRBCs last Saturday.  She is feeling improved today and her hemoglobin has risen from 8 to 9.7.  She denies fevers, chills, nausea,vomiting, constipation, diarrhea, numbness, nail changes, mucositis, or any further  concerns.    MEDICAL HISTORY: Past Medical History  Diagnosis Date  . Anemia   . Thyroid disease   . Wears glasses   . Breast cancer 2014    ER+/PR+/Her2-  . Spherocytosis 1989    ALLERGIES:   No Known Allergies   MEDICATIONS:  Current Outpatient Prescriptions  Medication Sig Dispense Refill  . Cholecalciferol (VITAMIN D3) 1000 UNITS CAPS Take 1 capsule by mouth daily.      Marland Kitchen docusate sodium (COLACE) 100 MG capsule Take 100 mg by mouth 3 (three) times daily as needed.       . folic acid (FOLVITE) 026 MCG tablet Take 400 mcg by mouth 2 (two) times daily.       Marland Kitchen lidocaine-prilocaine (EMLA) cream Apply topically as needed.  30 g  6  . Prenatal Vit-Fe Fumarate-FA (PRENATAL MULTIVITAMIN) TABS tablet Take 1 tablet by mouth daily at 12 noon.      . valACYclovir (VALTREX) 500 MG tablet Take 500 mg by mouth daily.       . cetirizine (ZYRTEC) 10 MG tablet Take 10 mg by mouth daily.      . Multiple Vitamins-Minerals (AIRBORNE PO) Take by mouth. Take 1 PO daily with meal.       No current facility-administered medications for this visit.    SURGICAL HISTORY:  Past Surgical History  Procedure Laterality Date  . Wisdom tooth extraction    . Portacath placement Left 09/20/2012    Procedure: INSERTION PORT-A-CATH;  Surgeon: [name], MD;  Location:  [facility];  Service: General;  Laterality: Left;    REVIEW OF SYSTEMS:  A 10 point review of systems was completed and is negative except as stated above.    PHYSICAL EXAMINATION: Blood pressure 129/74, pulse 85, temperature 97.8 F (36.6 C), temperature source Oral, resp. rate 18, height $RemoveBe'5\' 5"'MeXSPdCMU$  (1.651 m), weight 198 lb 6.4 oz (89.994 kg). Body mass index is 33.02 kg/(m^2). GENERAL: Patient is a well appearing female in no acute distress HEENT:  Sclerae anicteric.  Oropharynx clear and moist. No ulcerations or evidence of oropharyngeal candidiasis. Neck is supple.  NODES:  No cervical, supraclavicular, or axillary  lymphadenopathy palpated.  BREAST EXAM: right breast nodule at 6 is softer,a bout 1cm LUNGS:  Clear to auscultation bilaterally.  No wheezes or rhonchi. HEART:  Regular rate and rhythm. No murmur appreciated. ABDOMEN:  Soft, nontender.  Positive, normoactive bowel sounds. No organomegaly palpated. MSK:  No focal spinal tenderness to palpation. Full range of motion bilaterally in the upper extremities. EXTREMITIES:  No peripheral edema.   SKIN:  Clear with no obvious rashes or skin changes. No nail dyscrasia. NEURO:  Nonfocal. Well oriented.  Appropriate affect. ECOG FS: 1 - Symptomatic but completely ambulatory  LABORATORY DATA: Lab Results  Component Value Date   WBC 8.5 02/17/2013   HGB 9.7* 02/17/2013   HCT 29.3* 02/17/2013   MCV 84.0 02/17/2013   PLT 200 02/17/2013      Chemistry      Component Value Date/Time   NA 142 02/17/2013 0852   NA 136 10/26/2012 0918   K 4.1 02/17/2013 0852   K 3.3* 10/26/2012 0918   CL 101 10/26/2012 0918   CO2 25 02/17/2013 0852   CO2 27 10/26/2012 0918   BUN 7.4 02/17/2013 0852   BUN 15 10/26/2012 0918   CREATININE 0.7 02/17/2013 0852   CREATININE 0.60 10/26/2012 0918      Component Value Date/Time   CALCIUM 9.3 02/17/2013 0852   CALCIUM 8.4 10/26/2012 0918   ALKPHOS 74 02/17/2013 0852   ALKPHOS 118* 10/26/2012 0918   AST 18 02/17/2013 0852   AST 12 10/26/2012 0918   ALT 22 02/17/2013 0852   ALT 18 10/26/2012 0918   BILITOT 1.04 02/17/2013 0852   BILITOT 0.6 10/26/2012 0918       RADIOGRAPHIC STUDIES: No results found.   ASSESSMENT:   Mary Dougherty is a 43 y.o. female with:   #1 First ever mammogram found an abnormal mass in the right breast.  Ultrasound confirmed the mass to be 1.4 cm at the 6 o'clock position.   MRI revealed the mass to be a little bit bigger at 2.7 cm. Biopsy showed an invasive ductal carcinoma, grade 2, estrogen receptor/progesterone receptor positive, HER-2/neu negative, with a Ki-67 of 70%.   #2  Patient desires breast  conservation and started receiving neoadjuvant chemotherapy consisting of Q2 week dose dense FEC for a total of 6 cycles.  This will be followed by Taxol weekly for a total of 12 weeks. Risks benefits and side effects of this treatment were discussed with the patient.   #3  After definitive surgery, the plan is for the patient to receive radiation therapy adjuvantly under the direction of Dr. Isidore Moos.  #4  Patient will need antiestrogen therapy after radiation completion because her tumor is estrogen receptor positive.  Since she has not been without a menses for one year, therefore our plan is to proceed with antiestrogen therapy of Tamoxifen for 10 years.   #  5 Acute on chronic anemia and GI bleeding (which has essentially resolved by mid November, 2014) - patient completed blood transfusion of 2 units of PRBCs on 12/09/2012 and consult with gastroenterologist Lucio Edward, M.D on 12/12/2012 .  She also received an additional two units of PRBCs on 01/14/13.  Patient has a blast in her peripheral blood and this was first noticed on 12/26 and then again on 02/03/13.  She has no blasts on today's morphology.  She does inform us that she has a h/o hereditary spherocytosis.  Labwork is pending.  She has seen Dr. Tressie Stalker previously for this.    PLAN:  #1  Ms. Dunkerson is doing essentially well today.  She continues to tolerate her weekly Taxol well.  She will proceed with week 8 of therapy today.    #2  I reviewed her weekly labs with her.  Her hemoglobin is 9.7.  She has improved with the blood transfusion she received last week.      #3  She will return in one week for labs and evaluation along with week 9 of her Taxol chemotherapy.  We will continue to monitor closely for signs of neuropathy.    #4  She has scheduled an MRI of the breasts on 02/27/13 and f/u with Dr. Donne Hazel on 03/02/13.    All questions answered. Ms. Speir and her sister were encouraged to contact us in the interim with any  questions, concerns, or problems.   I spent 25 minutes counseling the patient face to face.  The total time spent in the appointment was 30 minutes.  Minette Headland, [place] [phone] 02/18/2013    11:18 AM  ATTENDING'S ATTESTATION:  I personally reviewed patient's chart, examined patient myself, formulated the treatment plan as followed.    Reviewed patients labs that were drawn last week for work up of hemolysis. I also reviewed her prior work up for hemolytic anemia from outside. 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 It was felt that she may have spherocytosis. This may be what is causing her to have severe anemia. We will evanually need further work up but for now we will monitor and continue her chemo as planned.  Patient consents.  Marcy Panning, MD Medical/Oncology [facility] [phone] (beeper) [phone] (Office)  02/25/2013, 4:37 PM

## 2013-02-24 ENCOUNTER — Other Ambulatory Visit (HOSPITAL_BASED_OUTPATIENT_CLINIC_OR_DEPARTMENT_OTHER): Payer: BC Managed Care – PPO

## 2013-02-24 ENCOUNTER — Encounter: Payer: Self-pay | Admitting: Adult Health

## 2013-02-24 ENCOUNTER — Encounter: Payer: Self-pay | Admitting: Oncology

## 2013-02-24 ENCOUNTER — Ambulatory Visit (HOSPITAL_BASED_OUTPATIENT_CLINIC_OR_DEPARTMENT_OTHER): Payer: BC Managed Care – PPO

## 2013-02-24 ENCOUNTER — Ambulatory Visit (HOSPITAL_BASED_OUTPATIENT_CLINIC_OR_DEPARTMENT_OTHER): Payer: BC Managed Care – PPO | Admitting: Adult Health

## 2013-02-24 VITALS — BP 128/80 | HR 79 | Temp 98.7°F | Resp 20 | Ht 65.0 in | Wt 198.9 lb

## 2013-02-24 DIAGNOSIS — D638 Anemia in other chronic diseases classified elsewhere: Secondary | ICD-10-CM

## 2013-02-24 DIAGNOSIS — C50919 Malignant neoplasm of unspecified site of unspecified female breast: Secondary | ICD-10-CM

## 2013-02-24 DIAGNOSIS — C50511 Malignant neoplasm of lower-outer quadrant of right female breast: Secondary | ICD-10-CM

## 2013-02-24 DIAGNOSIS — K922 Gastrointestinal hemorrhage, unspecified: Secondary | ICD-10-CM

## 2013-02-24 DIAGNOSIS — Z5111 Encounter for antineoplastic chemotherapy: Secondary | ICD-10-CM

## 2013-02-24 LAB — COMPREHENSIVE METABOLIC PANEL (CC13)
ALK PHOS: 65 U/L (ref 40–150)
ALT: 25 U/L (ref 0–55)
AST: 17 U/L (ref 5–34)
Albumin: 3.9 g/dL (ref 3.5–5.0)
Anion Gap: 10 mEq/L (ref 3–11)
BILIRUBIN TOTAL: 1.28 mg/dL — AB (ref 0.20–1.20)
BUN: 9.9 mg/dL (ref 7.0–26.0)
CO2: 25 mEq/L (ref 22–29)
CREATININE: 0.7 mg/dL (ref 0.6–1.1)
Calcium: 9.4 mg/dL (ref 8.4–10.4)
Chloride: 105 mEq/L (ref 98–109)
Glucose: 100 mg/dl (ref 70–140)
POTASSIUM: 3.9 meq/L (ref 3.5–5.1)
Sodium: 140 mEq/L (ref 136–145)
Total Protein: 6.4 g/dL (ref 6.4–8.3)

## 2013-02-24 LAB — MORPHOLOGY: PLT EST: ADEQUATE

## 2013-02-24 LAB — CBC WITH DIFFERENTIAL/PLATELET
BASO%: 0.4 % (ref 0.0–2.0)
Basophils Absolute: 0 10*3/uL (ref 0.0–0.1)
EOS%: 1.8 % (ref 0.0–7.0)
Eosinophils Absolute: 0.2 10*3/uL (ref 0.0–0.5)
HCT: 27.1 % — ABNORMAL LOW (ref 34.8–46.6)
HGB: 9 g/dL — ABNORMAL LOW (ref 11.6–15.9)
LYMPH#: 1.4 10*3/uL (ref 0.9–3.3)
LYMPH%: 13.9 % — AB (ref 14.0–49.7)
MCH: 27.9 pg (ref 25.1–34.0)
MCHC: 33.2 g/dL (ref 31.5–36.0)
MCV: 83.9 fL (ref 79.5–101.0)
MONO#: 0.6 10*3/uL (ref 0.1–0.9)
MONO%: 5.9 % (ref 0.0–14.0)
NEUT%: 78 % — AB (ref 38.4–76.8)
NEUTROS ABS: 7.6 10*3/uL — AB (ref 1.5–6.5)
PLATELETS: 226 10*3/uL (ref 145–400)
RBC: 3.23 10*6/uL — ABNORMAL LOW (ref 3.70–5.45)
RDW: 23.8 % — ABNORMAL HIGH (ref 11.2–14.5)
WBC: 9.7 10*3/uL (ref 3.9–10.3)
nRBC: 1 % — ABNORMAL HIGH (ref 0–0)

## 2013-02-24 MED ORDER — DEXAMETHASONE SODIUM PHOSPHATE 20 MG/5ML IJ SOLN
INTRAMUSCULAR | Status: AC
  Filled 2013-02-24: qty 5

## 2013-02-24 MED ORDER — SODIUM CHLORIDE 0.9 % IJ SOLN
10.0000 mL | INTRAMUSCULAR | Status: DC | PRN
  Administered 2013-02-24: 10 mL
  Filled 2013-02-24: qty 10

## 2013-02-24 MED ORDER — DIPHENHYDRAMINE HCL 50 MG/ML IJ SOLN
50.0000 mg | Freq: Once | INTRAMUSCULAR | Status: AC
  Administered 2013-02-24: 50 mg via INTRAVENOUS

## 2013-02-24 MED ORDER — ONDANSETRON 8 MG/NS 50 ML IVPB
INTRAVENOUS | Status: AC
  Filled 2013-02-24: qty 8

## 2013-02-24 MED ORDER — FAMOTIDINE IN NACL 20-0.9 MG/50ML-% IV SOLN
INTRAVENOUS | Status: AC
  Filled 2013-02-24: qty 50

## 2013-02-24 MED ORDER — SODIUM CHLORIDE 0.9 % IV SOLN
80.0000 mg/m2 | Freq: Once | INTRAVENOUS | Status: AC
  Administered 2013-02-24: 162 mg via INTRAVENOUS
  Filled 2013-02-24: qty 27

## 2013-02-24 MED ORDER — DEXAMETHASONE SODIUM PHOSPHATE 20 MG/5ML IJ SOLN
20.0000 mg | Freq: Once | INTRAMUSCULAR | Status: AC
  Administered 2013-02-24: 20 mg via INTRAVENOUS

## 2013-02-24 MED ORDER — FAMOTIDINE IN NACL 20-0.9 MG/50ML-% IV SOLN
20.0000 mg | Freq: Once | INTRAVENOUS | Status: AC
  Administered 2013-02-24: 20 mg via INTRAVENOUS

## 2013-02-24 MED ORDER — SODIUM CHLORIDE 0.9 % IV SOLN
Freq: Once | INTRAVENOUS | Status: AC
  Administered 2013-02-24: 14:00:00 via INTRAVENOUS

## 2013-02-24 MED ORDER — DIPHENHYDRAMINE HCL 50 MG/ML IJ SOLN
INTRAMUSCULAR | Status: AC
  Filled 2013-02-24: qty 1

## 2013-02-24 MED ORDER — ONDANSETRON 8 MG/50ML IVPB (CHCC)
8.0000 mg | Freq: Once | INTRAVENOUS | Status: AC
  Administered 2013-02-24: 8 mg via INTRAVENOUS

## 2013-02-24 MED ORDER — HEPARIN SOD (PORK) LOCK FLUSH 100 UNIT/ML IV SOLN
500.0000 [IU] | Freq: Once | INTRAVENOUS | Status: AC | PRN
  Administered 2013-02-24: 500 [IU]
  Filled 2013-02-24: qty 5

## 2013-02-24 NOTE — Patient Instructions (Signed)
[facility] Discharge Instructions for Patients Receiving Chemotherapy  Today you received the following chemotherapy agents: Taxol.  To help prevent nausea and vomiting after your treatment, we encourage you to take your nausea medication as prescribed.   If you develop nausea and vomiting that is not controlled by your nausea medication, call the clinic.   BELOW ARE SYMPTOMS THAT SHOULD BE REPORTED IMMEDIATELY:  *FEVER GREATER THAN 100.5 F  *CHILLS WITH OR WITHOUT FEVER  NAUSEA AND VOMITING THAT IS NOT CONTROLLED WITH YOUR NAUSEA MEDICATION  *UNUSUAL SHORTNESS OF BREATH  *UNUSUAL BRUISING OR BLEEDING  TENDERNESS IN MOUTH AND THROAT WITH OR WITHOUT PRESENCE OF ULCERS  *URINARY PROBLEMS  *BOWEL PROBLEMS  UNUSUAL RASH Items with * indicate a potential emergency and should be followed up as soon as possible.  Feel free to call the clinic you have any questions or concerns. The clinic phone number is [phone].    

## 2013-02-24 NOTE — Progress Notes (Signed)
[facility]  Telephone:(336) [phone] Fax:(336) [phone]  OFFICE PROGRESS NOTE    ID: Mary Dougherty   DOB: February 08, 1970  MR#: [id number]  SWH#:[id number]   PCP: Mary Reichmann, MD GI: Mary Dougherty, M.D.   DIAGNOSIS:  Mary Dougherty is a 43 y.o. female diagnosed in 09/2012 with invasive ductal carcinoma of the right breast that is estrogen receptor positive, progesterone receptor positive, HER-2/neu negative.   PRIOR THERAPY: 1.  Patient went for her initial screening mammogram at the insistence of her sister. She was found to have a mass in the right breast. The ultrasound demonstrated a 1.4 cm area of concern. She had an MRI performed that revealed a 2.7 cm irregular area at the 6 o'clock location. There were no masses in the left breast no abnormal lymph nodes. Patient had a biopsy performed of the right breast that revealed an intermediate to high-grade invasive ductal carcinoma the tumor was estrogen receptor positive 99%, progesterone receptor positive 100%,  HER-2/neu negative with a Ki-67 of 70%.   2. received neoadjuvant chemotherapy with dose dense FEC (5-FU/epirubicin/Cytoxan) from 10/07/2012 through 12/16/12.  She started weekly Taxol on 12/30/12.  She is due to complete chemotherapy on 03/17/13.    3.  Acute on chronic anemia.  A few of her previous CBCs have had 1-2 blasts and NRBCs on them.  A work up is underway.  Per review of records by Dr. Tressie Dougherty she has previously been diagnosed with hereditary spherocytosis.     CURRENT THERAPY: Weekly Taxol  INTERVAL HISTORY: Mary Dougherty is a  43 y.o. female who returns for evaluation prior to weekly Taxol.  She is due for week 9 of her weekly Taxol.  She is doing well today.  She denies fevers, chills, nausea, vomiting, constipation, diarrhea, numbness, nail changes, or any further concerns.    MEDICAL HISTORY: Past Medical History  Diagnosis Date  . Anemia   . Thyroid disease   . Wears glasses    . Breast cancer 2014    ER+/PR+/Her2-  . Spherocytosis 1989    ALLERGIES:   No Known Allergies   MEDICATIONS:  Current Outpatient Prescriptions  Medication Sig Dispense Refill  . cetirizine (ZYRTEC) 10 MG tablet Take 10 mg by mouth daily.      . Cholecalciferol (VITAMIN D3) 1000 UNITS CAPS Take 1 capsule by mouth daily.      . folic acid (FOLVITE) 665 MCG tablet Take 400 mcg by mouth 2 (two) times daily.       Marland Kitchen lidocaine-prilocaine (EMLA) cream Apply topically as needed.  30 g  6  . valACYclovir (VALTREX) 500 MG tablet Take 500 mg by mouth daily.       Marland Kitchen docusate sodium (COLACE) 100 MG capsule Take 100 mg by mouth 3 (three) times daily as needed.       . Multiple Vitamins-Minerals (AIRBORNE PO) Take by mouth. Take 1 PO daily with meal.      . Prenatal Vit-Fe Fumarate-FA (PRENATAL MULTIVITAMIN) TABS tablet Take 1 tablet by mouth daily at 12 noon.       No current facility-administered medications for this visit.    SURGICAL HISTORY:  Past Surgical History  Procedure Laterality Date  . Wisdom tooth extraction    . Portacath placement Left 09/20/2012    Procedure: INSERTION PORT-A-CATH;  Surgeon: Mary Bookbinder, MD;  Location: [facility];  Service: General;  Laterality: Left;    REVIEW OF SYSTEMS:  A 10 point review  of systems was completed and is negative except as stated above.    PHYSICAL EXAMINATION: Blood pressure 128/80, pulse 79, temperature 98.7 F (37.1 C), temperature source Oral, resp. rate 20, height _0  (1.651 m), weight 198 lb 14.4 oz (90.22 kg). Body mass index is 33.1 kg/(m^2). GENERAL: Patient is a well appearing female in no acute distress HEENT:  Sclerae anicteric.  Oropharynx clear and moist. No ulcerations or evidence of oropharyngeal candidiasis. Neck is supple.  NODES:  No cervical, supraclavicular, or axillary lymphadenopathy palpated.  BREAST EXAM: right breast nodule at 6 is softer,a bout 1cm LUNGS:  Clear to auscultation  bilaterally.  No wheezes or rhonchi. HEART:  Regular rate and rhythm. No murmur appreciated. ABDOMEN:  Soft, nontender.  Positive, normoactive bowel sounds. No organomegaly palpated. MSK:  No focal spinal tenderness to palpation. Full range of motion bilaterally in the upper extremities. EXTREMITIES:  No peripheral edema.   SKIN:  Clear with no obvious rashes or skin changes. No nail dyscrasia. NEURO:  Nonfocal. Well oriented.  Appropriate affect. ECOG FS: 1 - Symptomatic but completely ambulatory  LABORATORY DATA: Lab Results  Component Value Date   WBC 9.7 02/24/2013   HGB 9.0* 02/24/2013   HCT 27.1* 02/24/2013   MCV 83.9 02/24/2013   PLT 226 02/24/2013      Chemistry      Component Value Date/Time   NA 140 02/24/2013 1146   NA 136 10/26/2012 0918   K 3.9 02/24/2013 1146   K 3.3* 10/26/2012 0918   CL 101 10/26/2012 0918   CO2 25 02/24/2013 1146   CO2 27 10/26/2012 0918   BUN 9.9 02/24/2013 1146   BUN 15 10/26/2012 0918   CREATININE 0.7 02/24/2013 1146   CREATININE 0.60 10/26/2012 0918      Component Value Date/Time   CALCIUM 9.4 02/24/2013 1146   CALCIUM 8.4 10/26/2012 0918   ALKPHOS 65 02/24/2013 1146   ALKPHOS 118* 10/26/2012 0918   AST 17 02/24/2013 1146   AST 12 10/26/2012 0918   ALT 25 02/24/2013 1146   ALT 18 10/26/2012 0918   BILITOT 1.28* 02/24/2013 1146   BILITOT 0.6 10/26/2012 0918       RADIOGRAPHIC STUDIES: No results found.   ASSESSMENT:   Mary Dougherty is a 43 y.o. female with:   #1 First ever mammogram found an abnormal mass in the right breast.  Ultrasound confirmed the mass to be 1.4 cm at the 6 o'clock position.   MRI revealed the mass to be a little bit bigger at 2.7 cm. Biopsy showed an invasive ductal carcinoma, grade 2, estrogen receptor/progesterone receptor positive, HER-2/neu negative, with a Ki-67 of 70%.   #2  Patient desires breast conservation and started receiving neoadjuvant chemotherapy consisting of Q2 week dose dense FEC for a total of 6  cycles.  This was followed by Taxol weekly for a total of 12 weeks. She will finish chemotherapy approximately on 03/17/13. Risks benefits and side effects of this treatment were discussed with the patient.   #3  After definitive surgery, the plan is for the patient to receive radiation therapy adjuvantly under the direction of Dr. Isidore Moos.  #4  Patient will need antiestrogen therapy after radiation completion because her tumor is estrogen receptor positive.  Since she has not been without a menses for one year, therefore our plan is to proceed with antiestrogen therapy of Tamoxifen for 10 years.   #5 Acute on chronic anemia and GI bleeding (which has essentially resolved by  mid November, 2014) - patient completed blood transfusion of 2 units of PRBCs on 12/09/2012 and consult with gastroenterologist Mary Dougherty, M.D on 12/12/2012 .  She also received an additional two units of PRBCs on 01/14/13.  Patient has a blast in her peripheral blood and this was first noticed on 12/26 and then again on 02/03/13.  She has no blasts on today's morphology.  She does inform us that she has a h/o hereditary spherocytosis.  Labwork is pending.  She has seen Dr. Tressie Dougherty previously for this.  She will continue to receive supportive blood transfusions as needed through her treatment.    PLAN:  #1  Mary Dougherty is doing essentially well today.  She continues to tolerate her weekly Taxol well.  She will proceed with week 9 of therapy today.    #2  I reviewed her weekly labs with her.  Her hemoglobin is 9.  We will continue to support her with PRBC transfusions as needed for anemia.   #3  She will return in one week for labs and evaluation along with week 10 of her Taxol chemotherapy.  We will continue to monitor closely for signs of neuropathy.    #4  She has scheduled an MRI of the breasts on 02/27/13 and f/u with Dr. Donne Hazel on 03/02/13.    All questions answered. Mary Dougherty and her sister were encouraged to  contact us in the interim with any questions, concerns, or problems.   I spent 25 minutes counseling the patient face to face.  The total time spent in the appointment was 30 minutes.  Minette Headland, [place] [phone] 02/26/2013    9:50 AM

## 2013-02-27 ENCOUNTER — Ambulatory Visit (HOSPITAL_COMMUNITY)
Admission: RE | Admit: 2013-02-27 | Discharge: 2013-02-27 | Disposition: A | Discharge status: Home / Self Care | Payer: BC Managed Care – PPO | Source: Ambulatory Visit | Attending: Adult Health | Admitting: Adult Health

## 2013-02-27 DIAGNOSIS — C50519 Malignant neoplasm of lower-outer quadrant of unspecified female breast: Secondary | ICD-10-CM | POA: Insufficient documentation

## 2013-02-27 DIAGNOSIS — Z79899 Other long term (current) drug therapy: Secondary | ICD-10-CM | POA: Insufficient documentation

## 2013-02-27 DIAGNOSIS — C50511 Malignant neoplasm of lower-outer quadrant of right female breast: Secondary | ICD-10-CM

## 2013-02-27 DIAGNOSIS — I517 Cardiomegaly: Secondary | ICD-10-CM | POA: Insufficient documentation

## 2013-02-27 MED ORDER — GADOBENATE DIMEGLUMINE 529 MG/ML IV SOLN
18.0000 mL | Freq: Once | INTRAVENOUS | Status: AC | PRN
  Administered 2013-02-27: 18 mL via INTRAVENOUS

## 2013-03-02 ENCOUNTER — Encounter (INDEPENDENT_AMBULATORY_CARE_PROVIDER_SITE_OTHER): Payer: Self-pay | Admitting: General Surgery

## 2013-03-02 ENCOUNTER — Other Ambulatory Visit: Payer: Self-pay | Admitting: Oncology

## 2013-03-02 ENCOUNTER — Other Ambulatory Visit (INDEPENDENT_AMBULATORY_CARE_PROVIDER_SITE_OTHER): Payer: Self-pay | Admitting: General Surgery

## 2013-03-02 ENCOUNTER — Ambulatory Visit (INDEPENDENT_AMBULATORY_CARE_PROVIDER_SITE_OTHER): Payer: BC Managed Care – PPO | Admitting: General Surgery

## 2013-03-02 ENCOUNTER — Other Ambulatory Visit: Payer: Self-pay | Admitting: *Deleted

## 2013-03-02 ENCOUNTER — Telehealth (INDEPENDENT_AMBULATORY_CARE_PROVIDER_SITE_OTHER): Payer: Self-pay

## 2013-03-02 VITALS — BP 118/70 | HR 78 | Resp 16 | Ht 65.0 in | Wt 196.0 lb

## 2013-03-02 DIAGNOSIS — C50511 Malignant neoplasm of lower-outer quadrant of right female breast: Secondary | ICD-10-CM

## 2013-03-02 DIAGNOSIS — C50519 Malignant neoplasm of lower-outer quadrant of unspecified female breast: Secondary | ICD-10-CM

## 2013-03-02 NOTE — Telephone Encounter (Signed)
Called [facility] b/c we had to change the pt's surgery date from 04/03/13 to 04/06/13 due to scheduling the surgery the first time at [facility] and it should of been scheduled at [facility]. Colletta Maryland r/s the surgery to [facility] for 04/06/13. Earnest Bailey is r/s the seed placement only for 04/05/13 but she is going to check with the radiologist schedule first. Earnest Bailey will call me back to confirm the date for 04/05/13.

## 2013-03-02 NOTE — Progress Notes (Signed)
Patient ID: Mary Dougherty, female   DOB: 04/26/1970, 43 y.o.   MRN: [id number]  Chief Complaint  Patient presents with  . Breast Cancer Long Term Follow Up    Breast exam    HPI Mary Dougherty is a 43 y.o. female.   HPI This is a 43 year old female who has undergone primary chemotherapy for right breast cancer that is hormone receptor positive and HER-2/neu negative. This area initially was a 2.7 cm area. There was no lymph node involvement at the beginning. She has done well except for needing some blood transfusions for anemia. Her last MRI showed a decreased size now measuring 1.4 x 0.5 x 1 cm where this previously been 2.7 x 2.3 x 1.6 cm. I could not feel this on my last exam. There is no evidence of any multifocal, multicentric, or contralateral neoplasm there were no abnormal lymph nodes are identified. She comes back in today to discuss surgery.  Past Medical History  Diagnosis Date  . Anemia   . Thyroid disease   . Wears glasses   . Breast cancer 2014    ER+/PR+/Her2-  . Spherocytosis 1989    Past Surgical History  Procedure Laterality Date  . Wisdom tooth extraction    . Portacath placement Left 09/20/2012    Procedure: INSERTION PORT-A-CATH;  Surgeon: [name], MD;  Location: [place];  Service: General;  Laterality: Left;    Family History  Problem Relation Age of Onset  . Pulmonary embolism Maternal Uncle 62    died instantly  . Melanoma Cousin     paternal cousin dx in her 34s  . Heart disease Father   . Diabetes Father     Social History History  Substance Use Topics  . Smoking status: Never Smoker   . Smokeless tobacco: Never Used  . Alcohol Use: No    No Known Allergies  Current Outpatient Prescriptions  Medication Sig Dispense Refill  . cetirizine (ZYRTEC) 10 MG tablet Take 10 mg by mouth daily.      . Cholecalciferol (VITAMIN D3) 1000 UNITS CAPS Take 1 capsule by mouth daily.      Marland Kitchen docusate sodium (COLACE) 100 MG  capsule Take 100 mg by mouth 3 (three) times daily as needed.       . folic acid (FOLVITE) 993 MCG tablet Take 400 mcg by mouth 2 (two) times daily.       Marland Kitchen lidocaine-prilocaine (EMLA) cream Apply topically as needed.  30 g  6  . Multiple Vitamins-Minerals (AIRBORNE PO) Take by mouth. Take 1 PO daily with meal.      . Prenatal Vit-Fe Fumarate-FA (PRENATAL MULTIVITAMIN) TABS tablet Take 1 tablet by mouth daily at 12 noon.      . valACYclovir (VALTREX) 500 MG tablet Take 500 mg by mouth daily.        No current facility-administered medications for this visit.    Review of Systems Review of Systems  Constitutional: Negative for fever, chills and unexpected weight change.  HENT: Negative for congestion, hearing loss, sore throat, trouble swallowing and voice change.   Eyes: Negative for visual disturbance.  Respiratory: Negative for cough and wheezing.   Cardiovascular: Negative for chest pain, palpitations and leg swelling.  Gastrointestinal: Negative for nausea, vomiting, abdominal pain, diarrhea, constipation, blood in stool, abdominal distention and anal bleeding.  Genitourinary: Negative for hematuria, vaginal bleeding and difficulty urinating.  Musculoskeletal: Negative for arthralgias.  Skin: Negative for rash and wound.  Neurological: Negative for  seizures, syncope and headaches.  Hematological: Negative for adenopathy. Does not bruise/bleed easily.  Psychiatric/Behavioral: Negative for confusion.    Blood pressure 118/70, pulse 78, resp. rate 16, height _0  (1.651 m), weight 196 lb (88.905 kg), last menstrual period 10/28/2012.  Physical Exam Physical Exam  Vitals reviewed. Constitutional: She appears well-developed and well-nourished.  Neck: Neck supple.  Cardiovascular: Normal rate, regular rhythm and normal heart sounds.   Pulmonary/Chest: Effort normal and breath sounds normal. She has no wheezes. She has no rales. Right breast exhibits no inverted nipple, no mass, no  nipple discharge, no skin change and no tenderness. Left breast exhibits no inverted nipple, no mass, no nipple discharge, no skin change and no tenderness.  Lymphadenopathy:    She has no cervical adenopathy.    She has no axillary adenopathy.       Right: No supraclavicular adenopathy present.       Left: No supraclavicular adenopathy present.    Data Reviewed EXAM:  BILATERAL BREAST MRI WITH AND WITHOUT CONTRAST  LABS: None  TECHNIQUE:  Multiplanar, multisequence MR images of both breasts were obtained  prior to and following the intravenous administration of 56m of  MultiHance.  THREE-DIMENSIONAL MR IMAGE RENDERING ON INDEPENDENT WORKSTATION:  Three-dimensional MR images were rendered by post-processing of the  original MR data on an independent workstation. The  three-dimensional MR images were interpreted, and findings are  reported in the following complete MRI report for this study. Three  dimensional images were evaluated at the independent DynaCad  workstation  COMPARISON: 09/09/2012 MRI and prior mammograms.  FINDINGS:  Breast composition: b. Scattered fibroglandular tissue.  Background parenchymal enhancement: Minimal  Right breast: A 1.4 x 0.5 x 1 cm irregular enhancing mass (plateau  kinetics) with biopsy clip artifact is identified at the 6 o'clock  position of the right breast (middle third) compatible with  biopsy-proven neoplasm. This mass has decreased in size, enhancement  and volume - previously measuring 2.7 x 2.3 x 1.6 cm.  No other suspicious areas of enhancement are identified within the  right breast.  Left breast: No mass or abnormal enhancement.  Lymph nodes: No abnormal appearing lymph nodes.  Ancillary findings: Cardiomegaly noted. A left chest Port-A-Cath is  present.  IMPRESSION:  Decreasing size/volume and enhancement of known lower right breast  neoplasm, now measuring 1.4 x 0.5 x 1 cm - previously 2.7 x 2.3 x  1.6 cm.  No evidence of  multifocal, multicentric or contralateral neoplasm.  No abnormal lymph nodes identified.    Assessment   Clinical stage II right breast cancer status post primary chemotherapy  Plan    Right breast radioactive seed guided lumpectomy, right axillary sentinel lymph node biopsy    She has had a good response to her chemotherapy. I think a be very reasonable to pursue breast conservation therapy at this point we'll plan on scheduling her 2-3 weeks after she completes chemotherapy.   We discussed a sentinel lymph node biopsy as she does not appear to having lymph node involvement right now. We discussed the performance of that with injection of radioactive tracer and blue dye. We discussed that she would have an incision underneath her axillary hairline. We discussed that there is a chance of having a positive node with a sentinel lymph node biopsy and we will await the permanent pathology to make any other first further decisions in terms of her treatment. One of these options might be to return to the operating room to perform  an axillary lymph node dissection. We discussed about a 5% risk lifetime of chronic shoulder pain as well as lymphedema associated with a sentinel lymph node biopsy.  We discussed the options for treatment of the breast cancer which included lumpectomy versus a mastectomy. I think she is a good candidate for lumpectomy now.We discussed the performance of the lumpectomy with a wire placement. We discussed a 5-10% chance of a positive margin requiring reexcision in the operating room. We also discussed that she will need radiation therapy or antiestrogen therapy or both if she undergoes lumpectomy. We discussed the mastectomy and the postoperative care for that as well. We discussed that there is no difference in her survival whether she undergoes lumpectomy with radiation therapy or antiestrogen therapy versus a mastectomy.  We discussed the risks of operation including bleeding,  infection, possible reoperation.      Baljit Liebert 03/02/2013, 1:57 PM

## 2013-03-02 NOTE — Telephone Encounter (Signed)
Earnest Bailey confirmed pt will be seen on 04/05/13 at 12:00 for the seed placement. They will mail info to the pt.

## 2013-03-02 NOTE — Telephone Encounter (Signed)
[facility] w[facility] to see about the radioactive seed placement for pt's surgery that is scheduled for 04/03/13 lumpectomy, pac removal, and sn bx. Colletta Maryland our surgery scheduler is the one that scheduled the surgery for the pt so I need to notify her once Earnest Bailey lets me know about the date. The pt will need to go in a few days before the surgery to have the seed placed. Earnest Bailey gave me the date for 03/31/13 but she is going to check to make sure she has both of her doctors on the schedule to work that day when the pt comes in for the appt.

## 2013-03-02 NOTE — Telephone Encounter (Signed)
Diane calling to confirm that the date will be 03/31/13 for the seed only placement. They are looking for the appt time now and will get back in touch with me. They are going to call the pt and give her the appt info along with mailing her some info about the procedure. I will notify Dr Donne Hazel once I get all of the final plans.

## 2013-03-03 ENCOUNTER — Encounter: Payer: Self-pay | Admitting: Oncology

## 2013-03-03 ENCOUNTER — Other Ambulatory Visit (HOSPITAL_BASED_OUTPATIENT_CLINIC_OR_DEPARTMENT_OTHER): Payer: BC Managed Care – PPO

## 2013-03-03 ENCOUNTER — Ambulatory Visit (HOSPITAL_BASED_OUTPATIENT_CLINIC_OR_DEPARTMENT_OTHER): Payer: BC Managed Care – PPO | Admitting: Oncology

## 2013-03-03 ENCOUNTER — Ambulatory Visit (HOSPITAL_BASED_OUTPATIENT_CLINIC_OR_DEPARTMENT_OTHER): Payer: BC Managed Care – PPO

## 2013-03-03 VITALS — BP 124/74 | HR 78 | Temp 97.9°F | Resp 18 | Ht 65.0 in | Wt 198.4 lb

## 2013-03-03 DIAGNOSIS — C50511 Malignant neoplasm of lower-outer quadrant of right female breast: Secondary | ICD-10-CM

## 2013-03-03 DIAGNOSIS — C50919 Malignant neoplasm of unspecified site of unspecified female breast: Secondary | ICD-10-CM

## 2013-03-03 DIAGNOSIS — Z170 Estrogen receptor positive status [ER+]: Secondary | ICD-10-CM

## 2013-03-03 DIAGNOSIS — Z5111 Encounter for antineoplastic chemotherapy: Secondary | ICD-10-CM

## 2013-03-03 DIAGNOSIS — D649 Anemia, unspecified: Secondary | ICD-10-CM

## 2013-03-03 HISTORY — DX: Anemia, unspecified: D64.9

## 2013-03-03 LAB — COMPREHENSIVE METABOLIC PANEL (CC13)
ALBUMIN: 4 g/dL (ref 3.5–5.0)
ALT: 29 U/L (ref 0–55)
ANION GAP: 9 meq/L (ref 3–11)
AST: 21 U/L (ref 5–34)
Alkaline Phosphatase: 77 U/L (ref 40–150)
BILIRUBIN TOTAL: 1.23 mg/dL — AB (ref 0.20–1.20)
BUN: 8 mg/dL (ref 7.0–26.0)
CO2: 24 meq/L (ref 22–29)
Calcium: 9.2 mg/dL (ref 8.4–10.4)
Chloride: 107 mEq/L (ref 98–109)
Creatinine: 0.6 mg/dL (ref 0.6–1.1)
Glucose: 63 mg/dl — ABNORMAL LOW (ref 70–140)
Potassium: 3.9 mEq/L (ref 3.5–5.1)
SODIUM: 140 meq/L (ref 136–145)
TOTAL PROTEIN: 6.5 g/dL (ref 6.4–8.3)

## 2013-03-03 LAB — CBC WITH DIFFERENTIAL/PLATELET
BASO%: 0.7 % (ref 0.0–2.0)
Basophils Absolute: 0.1 10*3/uL (ref 0.0–0.1)
EOS ABS: 0.2 10*3/uL (ref 0.0–0.5)
EOS%: 2.2 % (ref 0.0–7.0)
HEMATOCRIT: 25.8 % — AB (ref 34.8–46.6)
HEMOGLOBIN: 8.6 g/dL — AB (ref 11.6–15.9)
LYMPH%: 14 % (ref 14.0–49.7)
MCH: 28.2 pg (ref 25.1–34.0)
MCHC: 33.3 g/dL (ref 31.5–36.0)
MCV: 84.6 fL (ref 79.5–101.0)
MONO#: 0.5 10*3/uL (ref 0.1–0.9)
MONO%: 5.7 % (ref 0.0–14.0)
NEUT%: 77.4 % — AB (ref 38.4–76.8)
NEUTROS ABS: 7.2 10*3/uL — AB (ref 1.5–6.5)
Platelets: 235 10*3/uL (ref 145–400)
RBC: 3.05 10*6/uL — AB (ref 3.70–5.45)
RDW: 24 % — ABNORMAL HIGH (ref 11.2–14.5)
WBC: 9.2 10*3/uL (ref 3.9–10.3)
lymph#: 1.3 10*3/uL (ref 0.9–3.3)

## 2013-03-03 LAB — MORPHOLOGY: PLT EST: ADEQUATE

## 2013-03-03 MED ORDER — FAMOTIDINE IN NACL 20-0.9 MG/50ML-% IV SOLN
INTRAVENOUS | Status: AC
  Filled 2013-03-03: qty 50

## 2013-03-03 MED ORDER — ONDANSETRON 8 MG/NS 50 ML IVPB
INTRAVENOUS | Status: AC
  Filled 2013-03-03: qty 8

## 2013-03-03 MED ORDER — SODIUM CHLORIDE 0.9 % IV SOLN
Freq: Once | INTRAVENOUS | Status: AC
  Administered 2013-03-03: 11:00:00 via INTRAVENOUS

## 2013-03-03 MED ORDER — SODIUM CHLORIDE 0.9 % IV SOLN
80.0000 mg/m2 | Freq: Once | INTRAVENOUS | Status: AC
  Administered 2013-03-03: 162 mg via INTRAVENOUS
  Filled 2013-03-03: qty 27

## 2013-03-03 MED ORDER — FAMOTIDINE IN NACL 20-0.9 MG/50ML-% IV SOLN
20.0000 mg | Freq: Once | INTRAVENOUS | Status: AC
  Administered 2013-03-03: 20 mg via INTRAVENOUS

## 2013-03-03 MED ORDER — DEXAMETHASONE SODIUM PHOSPHATE 20 MG/5ML IJ SOLN
20.0000 mg | Freq: Once | INTRAMUSCULAR | Status: AC
  Administered 2013-03-03: 20 mg via INTRAVENOUS

## 2013-03-03 MED ORDER — DIPHENHYDRAMINE HCL 50 MG/ML IJ SOLN
INTRAMUSCULAR | Status: AC
  Filled 2013-03-03: qty 1

## 2013-03-03 MED ORDER — DIPHENHYDRAMINE HCL 50 MG/ML IJ SOLN
50.0000 mg | Freq: Once | INTRAMUSCULAR | Status: AC
  Administered 2013-03-03: 50 mg via INTRAVENOUS

## 2013-03-03 MED ORDER — DEXAMETHASONE SODIUM PHOSPHATE 20 MG/5ML IJ SOLN
INTRAMUSCULAR | Status: AC
  Filled 2013-03-03: qty 5

## 2013-03-03 MED ORDER — HEPARIN SOD (PORK) LOCK FLUSH 100 UNIT/ML IV SOLN
500.0000 [IU] | Freq: Once | INTRAVENOUS | Status: AC | PRN
  Administered 2013-03-03: 500 [IU]
  Filled 2013-03-03: qty 5

## 2013-03-03 MED ORDER — ONDANSETRON 8 MG/50ML IVPB (CHCC)
8.0000 mg | Freq: Once | INTRAVENOUS | Status: AC
  Administered 2013-03-03: 8 mg via INTRAVENOUS

## 2013-03-03 MED ORDER — SODIUM CHLORIDE 0.9 % IJ SOLN
10.0000 mL | INTRAMUSCULAR | Status: DC | PRN
  Administered 2013-03-03: 10 mL
  Filled 2013-03-03: qty 10

## 2013-03-03 NOTE — Patient Instructions (Signed)
[facility] Discharge Instructions for Patients Receiving Chemotherapy  Today you received the following chemotherapy agents: Taxol.  To help prevent nausea and vomiting after your treatment, we encourage you to take your nausea medication as prescribed.   If you develop nausea and vomiting that is not controlled by your nausea medication, call the clinic.   BELOW ARE SYMPTOMS THAT SHOULD BE REPORTED IMMEDIATELY:  *FEVER GREATER THAN 100.5 F  *CHILLS WITH OR WITHOUT FEVER  NAUSEA AND VOMITING THAT IS NOT CONTROLLED WITH YOUR NAUSEA MEDICATION  *UNUSUAL SHORTNESS OF BREATH  *UNUSUAL BRUISING OR BLEEDING  TENDERNESS IN MOUTH AND THROAT WITH OR WITHOUT PRESENCE OF ULCERS  *URINARY PROBLEMS  *BOWEL PROBLEMS  UNUSUAL RASH Items with * indicate a potential emergency and should be followed up as soon as possible.  Feel free to call the clinic you have any questions or concerns. The clinic phone number is [phone].    

## 2013-03-03 NOTE — Progress Notes (Signed)
[place]  [phone] [phone]  OFFICE PROGRESS NOTE    ID: Mary Dougherty   DOB: 08-Oct-1970  MR#: [id number]  HDQ#:[id number]   PCP: Jenny Reichmann, MD GI: Lucio Edward, M.D.   DIAGNOSIS:  Mary Dougherty is a 43 y.o. female diagnosed in 09/2012 with invasive ductal carcinoma of the right breast that is estrogen receptor positive, progesterone receptor positive, HER-2/neu negative.   PRIOR THERAPY: 1.  Patient went for her initial screening mammogram at the insistence of her Mary. She was found to have a mass in the right breast. The ultrasound demonstrated a 1.4 cm area of concern. She had an MRI performed that revealed a 2.7 cm irregular area at the 6 o'clock location. There were no masses in the left breast no abnormal lymph nodes. Patient had a biopsy performed of the right breast that revealed an intermediate to high-grade invasive ductal carcinoma the tumor was estrogen receptor positive 99%, progesterone receptor positive 100%,  HER-2/neu negative with a Ki-67 of 70%.   2. received neoadjuvant chemotherapy with dose dense FEC (5-FU/epirubicin/Cytoxan) from 10/07/2012 through 12/16/12.  She started weekly Taxol on 12/30/12.  She is due to complete chemotherapy on 03/17/13.    3.  Acute on chronic anemia.  A few of her previous CBCs have had 1-2 blasts and NRBCs on them.  A work up is underway.  Per review of records by Dr. Tressie Stalker she has previously been diagnosed with hereditary spherocytosis.     CURRENT THERAPY: Weekly Taxol, here for week 10  INTERVAL HISTORY: Mary Dougherty is a  43 y.o. female who returns for evaluation prior to weekly Taxol.  She is due for week 10 of her weekly Taxol.  Her hemoglobin today is 8.0 we will continue to monitor her she is asymptomatic. She is doing well today.  She denies fevers, chills, nausea, vomiting, constipation, diarrhea, numbness, nail changes, or any further concerns.    MEDICAL  HISTORY: Past Medical History  Diagnosis Date  . Anemia   . Thyroid disease   . Wears glasses   . Breast cancer 2014    ER+/PR+/Her2-  . Spherocytosis 1989    ALLERGIES:   No Known Allergies   MEDICATIONS:  Current Outpatient Prescriptions  Medication Sig Dispense Refill  . Cholecalciferol (VITAMIN D3) 1000 UNITS CAPS Take 1 capsule by mouth daily.      Marland Kitchen docusate sodium (COLACE) 100 MG capsule Take 100 mg by mouth 3 (three) times daily as needed.       . folic acid (FOLVITE) 119 MCG tablet Take 400 mcg by mouth 2 (two) times daily.       Marland Kitchen lidocaine-prilocaine (EMLA) cream Apply topically as needed.  30 g  6  . Prenatal Vit-Fe Fumarate-FA (PRENATAL MULTIVITAMIN) TABS tablet Take 1 tablet by mouth daily at 12 noon.      . valACYclovir (VALTREX) 500 MG tablet TAKE ONE TABLET BY MOUTH TWICE DAILY  20 tablet  0   No current facility-administered medications for this visit.    SURGICAL HISTORY:  Past Surgical History  Procedure Laterality Date  . Wisdom tooth extraction    . Portacath placement Left 09/20/2012    Procedure: INSERTION PORT-A-CATH;  Surgeon: [name], MD;  Location: [place];  Service: General;  Laterality: Left;    REVIEW OF SYSTEMS:  A 10 point review of systems was completed and is negative except as stated above.    PHYSICAL EXAMINATION: Blood  pressure 124/74, pulse 78, temperature 97.9 F (36.6 C), temperature source Oral, resp. rate 18, height _0  (1.651 m), weight 198 lb 6.4 oz (89.994 kg), last menstrual period 10/28/2012. Body mass index is 33.02 kg/(m^2). GENERAL: Patient is a well appearing female in no acute distress HEENT:  Sclerae anicteric.  Oropharynx clear and moist. No ulcerations or evidence of oropharyngeal candidiasis. Neck is supple.  NODES:  No cervical, supraclavicular, or axillary lymphadenopathy palpated.  BREAST EXAM: right breast nodule at 6 is softer,a bout 1cm LUNGS:  Clear to auscultation bilaterally.   No wheezes or rhonchi. HEART:  Regular rate and rhythm. No murmur appreciated. ABDOMEN:  Soft, nontender.  Positive, normoactive bowel sounds. No organomegaly palpated. MSK:  No focal spinal tenderness to palpation. Full range of motion bilaterally in the upper extremities. EXTREMITIES:  No peripheral edema.   SKIN:  Clear with no obvious rashes or skin changes. No nail dyscrasia. NEURO:  Nonfocal. Well oriented.  Appropriate affect. ECOG FS: 1 - Symptomatic but completely ambulatory  LABORATORY DATA: Lab Results  Component Value Date   WBC 9.2 03/03/2013   HGB 8.6* 03/03/2013   HCT 25.8* 03/03/2013   MCV 84.6 03/03/2013   PLT 235 03/03/2013      Chemistry      Component Value Date/Time   NA 140 02/24/2013 1146   NA 136 10/26/2012 0918   K 3.9 02/24/2013 1146   K 3.3* 10/26/2012 0918   CL 101 10/26/2012 0918   CO2 25 02/24/2013 1146   CO2 27 10/26/2012 0918   BUN 9.9 02/24/2013 1146   BUN 15 10/26/2012 0918   CREATININE 0.7 02/24/2013 1146   CREATININE 0.60 10/26/2012 0918      Component Value Date/Time   CALCIUM 9.4 02/24/2013 1146   CALCIUM 8.4 10/26/2012 0918   ALKPHOS 65 02/24/2013 1146   ALKPHOS 118* 10/26/2012 0918   AST 17 02/24/2013 1146   AST 12 10/26/2012 0918   ALT 25 02/24/2013 1146   ALT 18 10/26/2012 0918   BILITOT 1.28* 02/24/2013 1146   BILITOT 0.6 10/26/2012 0918       RADIOGRAPHIC STUDIES: No results found.   ASSESSMENT/PLAN:   TAKESHIA WENK is a 43 y.o. female with:   #1 First ever mammogram found an abnormal mass in the right breast.  Ultrasound confirmed the mass to be 1.4 cm at the 6 o'clock position.   MRI revealed the mass to be a little bit bigger at 2.7 cm. Biopsy showed an invasive ductal carcinoma, grade 2, estrogen receptor/progesterone receptor positive, HER-2/neu negative, with a Ki-67 of 70%.   #2  patient was begun on neoadjuvant chemotherapy initially consisting of 6 cycles of FEC which he completed. Without any significant problems. She is now on  weekly Taxol. She is here for week #10. Overall she's tolerating it well. Her blood counts look good today. She will proceed with cycle 10.  #3 patient was seen by Dr. Donne Hazel. He is planning on doing her surgery sometime in early March. She had an MRI performed that revealed a good response with residual disease of about 1.4 cm.  #4 Acute on chronic anemia and GI bleeding (which has essentially resolved by mid November, 2014) - patient completed blood transfusion of 2 units of PRBCs on 12/09/2012 and consult with gastroenterologist Lucio Edward, M.D on 12/12/2012 .  She also received an additional two units of PRBCs on 01/14/13.  Patient has a blast in her peripheral blood and this was first noticed on  12/26 and then again on 02/03/13.  She has no blasts on today's morphology.  She does inform us that she has a h/o hereditary spherocytosis. She will eventually need a full workup for hereditary spherocytosis. We discussed the possibility of splenectomy eventually versus other therapies.  #5 patient will be seen back in one week's time for cycle #11 of Taxol.   All questions answered. Ms. Hoefer and her Mary were encouraged to contact us in the interim with any questions, concerns, or problems.   I spent 20 minutes counseling the patient face to face.  The total time spent in the appointment was 30 minutes.  Mary Panning, MD Medical/Oncology [facility] [phone] (beeper) [phone] (Office)  03/03/2013, 10:37 AM

## 2013-03-10 ENCOUNTER — Ambulatory Visit: Payer: BC Managed Care – PPO

## 2013-03-10 ENCOUNTER — Other Ambulatory Visit (HOSPITAL_BASED_OUTPATIENT_CLINIC_OR_DEPARTMENT_OTHER): Payer: BC Managed Care – PPO

## 2013-03-10 ENCOUNTER — Encounter: Payer: Self-pay | Admitting: Adult Health

## 2013-03-10 ENCOUNTER — Ambulatory Visit (HOSPITAL_BASED_OUTPATIENT_CLINIC_OR_DEPARTMENT_OTHER): Payer: BC Managed Care – PPO | Admitting: Adult Health

## 2013-03-10 ENCOUNTER — Ambulatory Visit (HOSPITAL_BASED_OUTPATIENT_CLINIC_OR_DEPARTMENT_OTHER): Payer: BC Managed Care – PPO

## 2013-03-10 VITALS — BP 124/71 | HR 90 | Temp 98.1°F | Resp 20 | Ht 65.0 in | Wt 198.2 lb

## 2013-03-10 DIAGNOSIS — Z5111 Encounter for antineoplastic chemotherapy: Secondary | ICD-10-CM

## 2013-03-10 DIAGNOSIS — C50511 Malignant neoplasm of lower-outer quadrant of right female breast: Secondary | ICD-10-CM

## 2013-03-10 DIAGNOSIS — R42 Dizziness and giddiness: Secondary | ICD-10-CM

## 2013-03-10 DIAGNOSIS — D649 Anemia, unspecified: Secondary | ICD-10-CM

## 2013-03-10 DIAGNOSIS — C50919 Malignant neoplasm of unspecified site of unspecified female breast: Secondary | ICD-10-CM

## 2013-03-10 DIAGNOSIS — Z170 Estrogen receptor positive status [ER+]: Secondary | ICD-10-CM

## 2013-03-10 DIAGNOSIS — E860 Dehydration: Secondary | ICD-10-CM

## 2013-03-10 LAB — CBC WITH DIFFERENTIAL/PLATELET
BASO%: 1.2 % (ref 0.0–2.0)
BASOS ABS: 0.1 10*3/uL (ref 0.0–0.1)
EOS%: 1.4 % (ref 0.0–7.0)
Eosinophils Absolute: 0.1 10*3/uL (ref 0.0–0.5)
HEMATOCRIT: 24.8 % — AB (ref 34.8–46.6)
HEMOGLOBIN: 8.2 g/dL — AB (ref 11.6–15.9)
LYMPH#: 1.3 10*3/uL (ref 0.9–3.3)
LYMPH%: 14.9 % (ref 14.0–49.7)
MCH: 28.6 pg (ref 25.1–34.0)
MCHC: 33.1 g/dL (ref 31.5–36.0)
MCV: 86.4 fL (ref 79.5–101.0)
MONO#: 0.6 10*3/uL (ref 0.1–0.9)
MONO%: 7.1 % (ref 0.0–14.0)
NEUT#: 6.4 10*3/uL (ref 1.5–6.5)
NEUT%: 75.4 % (ref 38.4–76.8)
Platelets: 220 10*3/uL (ref 145–400)
RBC: 2.87 10*6/uL — ABNORMAL LOW (ref 3.70–5.45)
RDW: 24.8 % — ABNORMAL HIGH (ref 11.2–14.5)
WBC: 8.5 10*3/uL (ref 3.9–10.3)

## 2013-03-10 LAB — COMPREHENSIVE METABOLIC PANEL (CC13)
ALT: 25 U/L (ref 0–55)
AST: 20 U/L (ref 5–34)
Albumin: 4.1 g/dL (ref 3.5–5.0)
Alkaline Phosphatase: 80 U/L (ref 40–150)
Anion Gap: 11 mEq/L (ref 3–11)
BILIRUBIN TOTAL: 1.55 mg/dL — AB (ref 0.20–1.20)
BUN: 8.3 mg/dL (ref 7.0–26.0)
CHLORIDE: 105 meq/L (ref 98–109)
CO2: 24 meq/L (ref 22–29)
CREATININE: 0.7 mg/dL (ref 0.6–1.1)
Calcium: 9.8 mg/dL (ref 8.4–10.4)
GLUCOSE: 96 mg/dL (ref 70–140)
Potassium: 3.9 mEq/L (ref 3.5–5.1)
Sodium: 140 mEq/L (ref 136–145)
Total Protein: 6.5 g/dL (ref 6.4–8.3)

## 2013-03-10 LAB — MORPHOLOGY: PLT EST: ADEQUATE

## 2013-03-10 LAB — WHOLE BLOOD GLUCOSE
Glucose: 124 mg/dL — ABNORMAL HIGH (ref 70–100)
HRS PC: 6.5 Hours

## 2013-03-10 MED ORDER — SODIUM CHLORIDE 0.9 % IJ SOLN
10.0000 mL | INTRAMUSCULAR | Status: DC | PRN
  Administered 2013-03-10: 10 mL via INTRAVENOUS
  Filled 2013-03-10: qty 10

## 2013-03-10 MED ORDER — HEPARIN SOD (PORK) LOCK FLUSH 100 UNIT/ML IV SOLN
500.0000 [IU] | Freq: Once | INTRAVENOUS | Status: AC | PRN
  Administered 2013-03-10: 500 [IU]
  Filled 2013-03-10: qty 5

## 2013-03-10 MED ORDER — SODIUM CHLORIDE 0.9 % IV SOLN
Freq: Once | INTRAVENOUS | Status: AC
  Administered 2013-03-10: 12:00:00 via INTRAVENOUS

## 2013-03-10 MED ORDER — ONDANSETRON 8 MG/NS 50 ML IVPB
INTRAVENOUS | Status: AC
  Filled 2013-03-10: qty 8

## 2013-03-10 MED ORDER — HEPARIN SOD (PORK) LOCK FLUSH 100 UNIT/ML IV SOLN
500.0000 [IU] | Freq: Once | INTRAVENOUS | Status: AC
  Administered 2013-03-10: 500 [IU] via INTRAVENOUS
  Filled 2013-03-10: qty 5

## 2013-03-10 MED ORDER — PACLITAXEL CHEMO INJECTION 300 MG/50ML
80.0000 mg/m2 | Freq: Once | INTRAVENOUS | Status: AC
  Administered 2013-03-10: 162 mg via INTRAVENOUS
  Filled 2013-03-10: qty 27

## 2013-03-10 MED ORDER — SODIUM CHLORIDE 0.9 % IV SOLN
INTRAVENOUS | Status: DC
  Administered 2013-03-10: 15:00:00 via INTRAVENOUS

## 2013-03-10 MED ORDER — FAMOTIDINE IN NACL 20-0.9 MG/50ML-% IV SOLN
20.0000 mg | Freq: Once | INTRAVENOUS | Status: AC
  Administered 2013-03-10: 20 mg via INTRAVENOUS

## 2013-03-10 MED ORDER — SODIUM CHLORIDE 0.9 % IJ SOLN
10.0000 mL | INTRAMUSCULAR | Status: DC | PRN
  Administered 2013-03-10: 10 mL
  Filled 2013-03-10: qty 10

## 2013-03-10 MED ORDER — DEXAMETHASONE SODIUM PHOSPHATE 20 MG/5ML IJ SOLN
INTRAMUSCULAR | Status: AC
  Filled 2013-03-10: qty 5

## 2013-03-10 MED ORDER — DIPHENHYDRAMINE HCL 50 MG/ML IJ SOLN
50.0000 mg | Freq: Once | INTRAMUSCULAR | Status: AC
  Administered 2013-03-10: 50 mg via INTRAVENOUS

## 2013-03-10 MED ORDER — DIPHENHYDRAMINE HCL 50 MG/ML IJ SOLN
INTRAMUSCULAR | Status: AC
  Filled 2013-03-10: qty 1

## 2013-03-10 MED ORDER — ONDANSETRON 8 MG/50ML IVPB (CHCC)
8.0000 mg | Freq: Once | INTRAVENOUS | Status: AC
  Administered 2013-03-10: 8 mg via INTRAVENOUS

## 2013-03-10 MED ORDER — FAMOTIDINE IN NACL 20-0.9 MG/50ML-% IV SOLN
INTRAVENOUS | Status: AC
  Filled 2013-03-10: qty 50

## 2013-03-10 MED ORDER — DEXAMETHASONE SODIUM PHOSPHATE 20 MG/5ML IJ SOLN
20.0000 mg | Freq: Once | INTRAMUSCULAR | Status: AC
  Administered 2013-03-10: 20 mg via INTRAVENOUS

## 2013-03-10 NOTE — Patient Instructions (Signed)
[facility] Discharge Instructions for Patients Receiving Chemotherapy  Today you received the following chemotherapy agents: Taxol  To help prevent nausea and vomiting after your treatment, we encourage you to take your nausea medication as prescribed by your physician.  If you develop nausea and vomiting that is not controlled by your nausea medication, call the clinic.   BELOW ARE SYMPTOMS THAT SHOULD BE REPORTED IMMEDIATELY:  *FEVER GREATER THAN 100.5 F  *CHILLS WITH OR WITHOUT FEVER  NAUSEA AND VOMITING THAT IS NOT CONTROLLED WITH YOUR NAUSEA MEDICATION  *UNUSUAL SHORTNESS OF BREATH  *UNUSUAL BRUISING OR BLEEDING  TENDERNESS IN MOUTH AND THROAT WITH OR WITHOUT PRESENCE OF ULCERS  *URINARY PROBLEMS  *BOWEL PROBLEMS  UNUSUAL RASH Items with * indicate a potential emergency and should be followed up as soon as possible.  Feel free to call the clinic you have any questions or concerns. The clinic phone number is [phone].    

## 2013-03-10 NOTE — Patient Instructions (Signed)
Dehydration, Adult Dehydration is when you lose more fluids from the body than you take in. Vital organs like the kidneys, brain, and heart cannot function without a proper amount of fluids and salt. Any loss of fluids from the body can cause dehydration.  CAUSES   Vomiting.  Diarrhea.  Excessive sweating.  Excessive urine output.  Fever. SYMPTOMS  Mild dehydration  Thirst.  Dry lips.  Slightly dry mouth. Moderate dehydration  Very dry mouth.  Sunken eyes.  Skin does not bounce back quickly when lightly pinched and released.  Dark urine and decreased urine production.  Decreased tear production.  Headache. Severe dehydration  Very dry mouth.  Extreme thirst.  Rapid, weak pulse (more than 100 beats per minute at rest).  Cold hands and feet.  Not able to sweat in spite of heat and temperature.  Rapid breathing.  Blue lips.  Confusion and lethargy.  Difficulty being awakened.  Minimal urine production.  No tears. DIAGNOSIS  Your caregiver will diagnose dehydration based on your symptoms and your exam. Blood and urine tests will help confirm the diagnosis. The diagnostic evaluation should also identify the cause of dehydration. TREATMENT  Treatment of mild or moderate dehydration can often be done at home by increasing the amount of fluids that you drink. It is best to drink small amounts of fluid more often. Drinking too much at one time can make vomiting worse. Refer to the home care instructions below. Severe dehydration needs to be treated at the hospital where you will probably be given intravenous (IV) fluids that contain water and electrolytes. HOME CARE INSTRUCTIONS   Ask your caregiver about specific rehydration instructions.  Drink enough fluids to keep your urine clear or pale yellow.  Drink small amounts frequently if you have nausea and vomiting.  Eat as you normally do.  Avoid:  Foods or drinks high in sugar.  Carbonated  drinks.  Juice.  Extremely hot or cold fluids.  Drinks with caffeine.  Fatty, greasy foods.  Alcohol.  Tobacco.  Overeating.  Gelatin desserts.  Wash your hands well to avoid spreading bacteria and viruses.  Only take over-the-counter or prescription medicines for pain, discomfort, or fever as directed by your caregiver.  Ask your caregiver if you should continue all prescribed and over-the-counter medicines.  Keep all follow-up appointments with your caregiver. SEEK MEDICAL CARE IF:  You have abdominal pain and it increases or stays in one area (localizes).  You have a rash, stiff neck, or severe headache.  You are irritable, sleepy, or difficult to awaken.  You are weak, dizzy, or extremely thirsty. SEEK IMMEDIATE MEDICAL CARE IF:   You are unable to keep fluids down or you get worse despite treatment.  You have frequent episodes of vomiting or diarrhea.  You have blood or green matter (bile) in your vomit.  You have blood in your stool or your stool looks black and tarry.  You have not urinated in 6 to 8 hours, or you have only urinated a small amount of very dark urine.  You have a fever.  You faint. MAKE SURE YOU:   Understand these instructions.  Will watch your condition.  Will get help right away if you are not doing well or get worse. Document Released: 01/19/2005 Document Revised: 04/13/2011 Document Reviewed: 09/08/2010 Exit[employer] Patient Information 2014 [employer].  

## 2013-03-10 NOTE — Progress Notes (Signed)
[place]  Telephone:(336) [phone] Fax:(336) [phone]  OFFICE PROGRESS NOTE    ID: Mary Dougherty   DOB: [birth year]  MR#: [id number]  ZHY#:[id number]   PCP: Mary Reichmann, Mary Dougherty GI: Mary Dougherty, M.D.   DIAGNOSIS:  Mary Dougherty is a 43 y.o. female diagnosed in 09/2012 with invasive ductal carcinoma of the right breast that is estrogen receptor positive, progesterone receptor positive, HER-2/neu negative.   PRIOR THERAPY: 1.  Patient went for her initial screening mammogram at the insistence of her sister. She was found to have a mass in the right breast. The ultrasound demonstrated a 1.4 cm area of concern. She had an MRI performed that revealed a 2.7 cm irregular area at the 6 o'clock location. There were no masses in the left breast no abnormal lymph nodes. Patient had a biopsy performed of the right breast that revealed an intermediate to high-grade invasive ductal carcinoma the tumor was estrogen receptor positive 99%, progesterone receptor positive 100%,  HER-2/neu negative with a Ki-67 of 70%.   2.Patient received neoadjuvant chemotherapy with dose dense FEC (5-FU/epirubicin/Cytoxan) from 10/07/2012 through 12/16/12.  She started weekly Taxol on 12/30/12.  She is due to complete chemotherapy on 03/17/13.    3.  Acute on chronic anemia.  A few of her previous CBCs have had 1-2 blasts and NRBCs on them.  A work up is underway.  Per review of records by Dr. Tressie Dougherty she has previously been diagnosed with hereditary spherocytosis.     CURRENT THERAPY: Weekly Taxol, here for week 11  INTERVAL HISTORY: Mary Dougherty is a  43 y.o. female who returns for evaluation prior to weekly Taxol.  She is doing well today.  She has no symptoms of anemia.  She denies fevers, chills, nausea, vomiting, constipation, diarrhea, numbness,  DOE, dizziness, chest pain, palpitations, or any other concerns.  A 10 point ROS is neg.   MEDICAL HISTORY: Past Medical History   Diagnosis Date  . Anemia   . Thyroid disease   . Wears glasses   . Breast cancer 2014    ER+/PR+/Her2-  . Spherocytosis 1989    ALLERGIES:   No Known Allergies   MEDICATIONS:  Current Outpatient Prescriptions  Medication Sig Dispense Refill  . Cholecalciferol (VITAMIN D3) 1000 UNITS CAPS Take 1 capsule by mouth daily.      Marland Kitchen docusate sodium (COLACE) 100 MG capsule Take 100 mg by mouth 3 (three) times daily as needed.       . folic acid (FOLVITE) 295 MCG tablet Take 400 mcg by mouth 2 (two) times daily.       Marland Kitchen lidocaine-prilocaine (EMLA) cream Apply topically as needed.  30 g  6  . Prenatal Vit-Fe Fumarate-FA (PRENATAL MULTIVITAMIN) TABS tablet Take 1 tablet by mouth daily at 12 noon.      . valACYclovir (VALTREX) 500 MG tablet TAKE ONE TABLET BY MOUTH TWICE DAILY  20 tablet  0   Current Facility-Administered Medications  Medication Dose Route Frequency Provider Last Rate Last Dose  . 0.9 %  sodium chloride infusion   Intravenous Continuous Mary Headland, NP 500 mL/hr at 03/10/13 1520     Facility-Administered Medications Ordered in Other Visits  Medication Dose Route Frequency Provider Last Rate Last Dose  . heparin lock flush 100 unit/mL  500 Units Intravenous Once Mary Headland, NP      . sodium chloride 0.9 % injection 10 mL  10 mL Intravenous PRN Mary Headland, NP  SURGICAL HISTORY:  Past Surgical History  Procedure Laterality Date  . Wisdom tooth extraction    . Portacath placement Left 09/20/2012    Procedure: INSERTION PORT-A-CATH;  Surgeon: Mary Bookbinder, Mary Dougherty;  Location: [place];  Service: General;  Laterality: Left;    REVIEW OF SYSTEMS:  A 10 point review of systems was completed and is negative except as stated above.   PHYSICAL EXAMINATION: Blood pressure 124/71, pulse 90, temperature 98.1 F (36.7 C), temperature source Oral, resp. rate 20, height _0  (1.651 m), weight 198 lb 3.2 oz (89.903 kg), last menstrual  period 10/28/2012. Body mass index is 32.98 kg/(m^2). GENERAL: Patient is a well appearing female in no acute distress HEENT:  Sclerae anicteric.  Oropharynx clear and moist. No ulcerations or evidence of oropharyngeal candidiasis. Neck is supple.  NODES:  No cervical, supraclavicular, or axillary lymphadenopathy palpated.  BREAST EXAM: right breast nodule at 6 is softer,a bout 1cm LUNGS:  Clear to auscultation bilaterally.  No wheezes or rhonchi. HEART:  Regular rate and rhythm. No murmur appreciated. ABDOMEN:  Soft, nontender.  Positive, normoactive bowel sounds. No organomegaly palpated. MSK:  No focal spinal tenderness to palpation. Full range of motion bilaterally in the upper extremities. EXTREMITIES:  No peripheral edema.   SKIN:  Clear with no obvious rashes or skin changes. No nail dyscrasia. NEURO:  Nonfocal. Well oriented.  Appropriate affect. ECOG FS: 1 - Symptomatic but completely ambulatory  LABORATORY DATA: Lab Results  Component Value Date   WBC 8.5 03/10/2013   HGB 8.2* 03/10/2013   HCT 24.8* 03/10/2013   MCV 86.4 03/10/2013   PLT 220 03/10/2013      Chemistry      Component Value Date/Time   NA 140 03/10/2013 1110   NA 136 10/26/2012 0918   K 3.9 03/10/2013 1110   K 3.3* 10/26/2012 0918   CL 101 10/26/2012 0918   CO2 24 03/10/2013 1110   CO2 27 10/26/2012 0918   BUN 8.3 03/10/2013 1110   BUN 15 10/26/2012 0918   CREATININE 0.7 03/10/2013 1110   CREATININE 0.60 10/26/2012 0918   GLU 124* 03/10/2013 1438      Component Value Date/Time   CALCIUM 9.8 03/10/2013 1110   CALCIUM 8.4 10/26/2012 0918   ALKPHOS 80 03/10/2013 1110   ALKPHOS 118* 10/26/2012 0918   AST 20 03/10/2013 1110   AST 12 10/26/2012 0918   ALT 25 03/10/2013 1110   ALT 18 10/26/2012 0918   BILITOT 1.55* 03/10/2013 1110   BILITOT 0.6 10/26/2012 0918       RADIOGRAPHIC STUDIES: No results found.   ASSESSMENT/PLAN:   Mary Dougherty is a 43 y.o. female with:   #1 First ever mammogram found an abnormal mass in the right  breast.  Ultrasound confirmed the mass to be 1.4 cm at the 6 o'clock position.   MRI revealed the mass to be a little bit bigger at 2.7 cm. Biopsy showed an invasive ductal carcinoma, grade 2, estrogen receptor/progesterone receptor positive, HER-2/neu negative, with a Ki-67 of 70%.   #2  patient was begun on neoadjuvant chemotherapy initially consisting of 6 cycles of FEC which he completed. Without any significant problems. She is now on weekly Taxol. She is here for week #11. Overall she's tolerating it well. Her blood counts stable today.  She isn't having symptomatic anemia, and her hemoglobin is 8.2. She will proceed with cycle 11 today.  #3 patient was seen by Dr. Donne Hazel. He is planning on  doing her surgery on March 5. She had an MRI performed that revealed a good response with residual disease of about 1.4 cm.  #4 Acute on chronic anemia and GI bleeding (which has essentially resolved by mid November, 2014) - patient completed blood transfusion of 2 units of PRBCs on 12/09/2012 and consult with gastroenterologist Mary Dougherty, M.D on 12/12/2012 .  She also received an additional two units of PRBCs on 01/14/13.  Patient has a blast in her peripheral blood and this was first noticed on 12/26 and then again on 02/03/13.  She has no blasts on today's morphology.  She does inform us that she has a h/o hereditary spherocytosis. She will eventually need a full workup for hereditary spherocytosis. We discussed the possibility of splenectomy eventually versus other therapies.  #5 patient will be seen back in one week's time for cycle #12 of Taxol.  We will continue to monitor her closely for neuropathy.    All questions answered. Ms. Saia and her sister were encouraged to contact us in the interim with any questions, concerns, or problems.   I spent 25 minutes counseling the patient face to face.  The total time spent in the appointment was 30 minutes.  Mary Dougherty, [place] [phone] 03/10/2013, 3:29 PM

## 2013-03-17 ENCOUNTER — Ambulatory Visit (HOSPITAL_COMMUNITY)
Admission: RE | Admit: 2013-03-17 | Discharge: 2013-03-17 | Disposition: A | Discharge status: Home / Self Care | Payer: BC Managed Care – PPO | Source: Ambulatory Visit | Attending: Oncology | Admitting: Oncology

## 2013-03-17 ENCOUNTER — Ambulatory Visit (HOSPITAL_BASED_OUTPATIENT_CLINIC_OR_DEPARTMENT_OTHER): Payer: BC Managed Care – PPO | Admitting: Adult Health

## 2013-03-17 ENCOUNTER — Telehealth: Payer: Self-pay | Admitting: *Deleted

## 2013-03-17 ENCOUNTER — Ambulatory Visit (HOSPITAL_BASED_OUTPATIENT_CLINIC_OR_DEPARTMENT_OTHER): Payer: BC Managed Care – PPO

## 2013-03-17 ENCOUNTER — Encounter: Payer: Self-pay | Admitting: Adult Health

## 2013-03-17 ENCOUNTER — Other Ambulatory Visit (HOSPITAL_BASED_OUTPATIENT_CLINIC_OR_DEPARTMENT_OTHER): Payer: BC Managed Care – PPO

## 2013-03-17 VITALS — BP 123/76 | HR 92 | Temp 97.9°F | Resp 20 | Ht 65.0 in | Wt 196.4 lb

## 2013-03-17 DIAGNOSIS — D649 Anemia, unspecified: Secondary | ICD-10-CM | POA: Insufficient documentation

## 2013-03-17 DIAGNOSIS — Z5111 Encounter for antineoplastic chemotherapy: Secondary | ICD-10-CM

## 2013-03-17 DIAGNOSIS — C50511 Malignant neoplasm of lower-outer quadrant of right female breast: Secondary | ICD-10-CM

## 2013-03-17 DIAGNOSIS — C50919 Malignant neoplasm of unspecified site of unspecified female breast: Secondary | ICD-10-CM

## 2013-03-17 DIAGNOSIS — Z170 Estrogen receptor positive status [ER+]: Secondary | ICD-10-CM

## 2013-03-17 LAB — COMPREHENSIVE METABOLIC PANEL (CC13)
ALT: 22 U/L (ref 0–55)
AST: 19 U/L (ref 5–34)
Albumin: 4 g/dL (ref 3.5–5.0)
Alkaline Phosphatase: 74 U/L (ref 40–150)
Anion Gap: 9 mEq/L (ref 3–11)
BUN: 11 mg/dL (ref 7.0–26.0)
CALCIUM: 9.4 mg/dL (ref 8.4–10.4)
CHLORIDE: 107 meq/L (ref 98–109)
CO2: 26 meq/L (ref 22–29)
CREATININE: 0.7 mg/dL (ref 0.6–1.1)
GLUCOSE: 71 mg/dL (ref 70–140)
Potassium: 3.8 mEq/L (ref 3.5–5.1)
Sodium: 142 mEq/L (ref 136–145)
Total Bilirubin: 1.29 mg/dL — ABNORMAL HIGH (ref 0.20–1.20)
Total Protein: 6.4 g/dL (ref 6.4–8.3)

## 2013-03-17 LAB — CBC WITH DIFFERENTIAL/PLATELET
BASO%: 0.9 % (ref 0.0–2.0)
Basophils Absolute: 0.1 10*3/uL (ref 0.0–0.1)
EOS ABS: 0.1 10*3/uL (ref 0.0–0.5)
EOS%: 1.3 % (ref 0.0–7.0)
HEMATOCRIT: 23.6 % — AB (ref 34.8–46.6)
HGB: 7.9 g/dL — ABNORMAL LOW (ref 11.6–15.9)
LYMPH%: 16.9 % (ref 14.0–49.7)
MCH: 28.8 pg (ref 25.1–34.0)
MCHC: 33.5 g/dL (ref 31.5–36.0)
MCV: 86.1 fL (ref 79.5–101.0)
MONO#: 0.5 10*3/uL (ref 0.1–0.9)
MONO%: 6.6 % (ref 0.0–14.0)
NEUT#: 5.8 10*3/uL (ref 1.5–6.5)
NEUT%: 74.3 % (ref 38.4–76.8)
PLATELETS: 227 10*3/uL (ref 145–400)
RBC: 2.74 10*6/uL — ABNORMAL LOW (ref 3.70–5.45)
RDW: 26 % — ABNORMAL HIGH (ref 11.2–14.5)
WBC: 7.9 10*3/uL (ref 3.9–10.3)
lymph#: 1.3 10*3/uL (ref 0.9–3.3)
nRBC: 4 % — ABNORMAL HIGH (ref 0–0)

## 2013-03-17 LAB — MORPHOLOGY: PLT EST: ADEQUATE

## 2013-03-17 LAB — HOLD TUBE, BLOOD BANK

## 2013-03-17 MED ORDER — DIPHENHYDRAMINE HCL 50 MG/ML IJ SOLN
50.0000 mg | Freq: Once | INTRAMUSCULAR | Status: AC
  Administered 2013-03-17: 50 mg via INTRAVENOUS

## 2013-03-17 MED ORDER — DEXAMETHASONE SODIUM PHOSPHATE 20 MG/5ML IJ SOLN
INTRAMUSCULAR | Status: AC
  Filled 2013-03-17: qty 5

## 2013-03-17 MED ORDER — DIPHENHYDRAMINE HCL 50 MG/ML IJ SOLN
INTRAMUSCULAR | Status: AC
  Filled 2013-03-17: qty 1

## 2013-03-17 MED ORDER — FAMOTIDINE IN NACL 20-0.9 MG/50ML-% IV SOLN
INTRAVENOUS | Status: AC
  Filled 2013-03-17: qty 50

## 2013-03-17 MED ORDER — ONDANSETRON 8 MG/50ML IVPB (CHCC)
8.0000 mg | Freq: Once | INTRAVENOUS | Status: AC
  Administered 2013-03-17: 8 mg via INTRAVENOUS

## 2013-03-17 MED ORDER — DEXAMETHASONE SODIUM PHOSPHATE 20 MG/5ML IJ SOLN
20.0000 mg | Freq: Once | INTRAMUSCULAR | Status: AC
  Administered 2013-03-17: 20 mg via INTRAVENOUS

## 2013-03-17 MED ORDER — ONDANSETRON 8 MG/NS 50 ML IVPB
INTRAVENOUS | Status: AC
  Filled 2013-03-17: qty 8

## 2013-03-17 MED ORDER — SODIUM CHLORIDE 0.9 % IJ SOLN
10.0000 mL | INTRAMUSCULAR | Status: DC | PRN
  Administered 2013-03-17: 10 mL
  Filled 2013-03-17: qty 10

## 2013-03-17 MED ORDER — FAMOTIDINE IN NACL 20-0.9 MG/50ML-% IV SOLN
20.0000 mg | Freq: Once | INTRAVENOUS | Status: AC
  Administered 2013-03-17: 20 mg via INTRAVENOUS

## 2013-03-17 MED ORDER — SODIUM CHLORIDE 0.9 % IV SOLN
80.0000 mg/m2 | Freq: Once | INTRAVENOUS | Status: AC
  Administered 2013-03-17: 162 mg via INTRAVENOUS
  Filled 2013-03-17: qty 27

## 2013-03-17 MED ORDER — HEPARIN SOD (PORK) LOCK FLUSH 100 UNIT/ML IV SOLN
500.0000 [IU] | Freq: Once | INTRAVENOUS | Status: AC | PRN
  Administered 2013-03-17: 500 [IU]
  Filled 2013-03-17: qty 5

## 2013-03-17 MED ORDER — SODIUM CHLORIDE 0.9 % IV SOLN
Freq: Once | INTRAVENOUS | Status: AC
  Administered 2013-03-17: 10:00:00 via INTRAVENOUS

## 2013-03-17 NOTE — Telephone Encounter (Signed)
Per staff message and POF I have scheduled appts.  JMW  

## 2013-03-17 NOTE — Telephone Encounter (Signed)
appts made printed. Pt is aware that i called and emailed MW to get her tx added for 03/18/13...td

## 2013-03-17 NOTE — Progress Notes (Signed)
[place]  Telephone:(336) [phone] Fax:(336) [phone]  OFFICE PROGRESS NOTE    ID: Mary Dougherty   DOB: [birth year]  MR#: [id number]  PTW#:[id number]   PCP: Jenny Reichmann, MD GI: Lucio Edward, M.D.   DIAGNOSIS:  Mary Dougherty is a 43 y.o. female diagnosed in 09/2012 with invasive ductal carcinoma of the right breast that is estrogen receptor positive, progesterone receptor positive, HER-2/neu negative.   PRIOR THERAPY: 1.  Patient went for her initial screening mammogram at the insistence of her sister. She was found to have a mass in the right breast. The ultrasound demonstrated a 1.4 cm area of concern. She had an MRI performed that revealed a 2.7 cm irregular area at the 6 o'clock location. There were no masses in the left breast no abnormal lymph nodes. Patient had a biopsy performed of the right breast that revealed an intermediate to high-grade invasive ductal carcinoma the tumor was estrogen receptor positive 99%, progesterone receptor positive 100%,  HER-2/neu negative with a Ki-67 of 70%.   2.Patient received neoadjuvant chemotherapy with dose dense FEC (5-FU/epirubicin/Cytoxan) from 10/07/2012 through 12/16/12.  She started weekly Taxol on 12/30/12.  She is due to complete chemotherapy on 03/17/13.    3.  Acute on chronic anemia.  A few of her previous CBCs have had 1-2 blasts and NRBCs on them.  A work up is underway.  Per review of records by Dr. Tressie Stalker she has previously been diagnosed with hereditary spherocytosis.     CURRENT THERAPY: Weekly Taxol, here for week 12  INTERVAL HISTORY: Mary Dougherty is a  43 y.o. female who returns for evaluation prior to weekly Taxol.  She is doing well today.  She is more anemic.  She denies fevers, chills, nausea, vomiting, constipation, diarrhea, neuropathy, nail changes, mouth pain, fatigue, shortness of breath, palpitations, or any further concerns.   MEDICAL HISTORY: Past Medical History  Diagnosis  Date  . Anemia   . Thyroid disease   . Wears glasses   . Breast cancer 2014    ER+/PR+/Her2-  . Spherocytosis 1989    ALLERGIES:   No Known Allergies   MEDICATIONS:  Current Outpatient Prescriptions  Medication Sig Dispense Refill  . Cholecalciferol (VITAMIN D3) 1000 UNITS CAPS Take 1 capsule by mouth daily.      Marland Kitchen docusate sodium (COLACE) 100 MG capsule Take 100 mg by mouth 3 (three) times daily as needed.       . folic acid (FOLVITE) 700 MCG tablet Take 400 mcg by mouth 2 (two) times daily.       Marland Kitchen lidocaine-prilocaine (EMLA) cream Apply topically as needed.  30 g  6  . Prenatal Vit-Fe Fumarate-FA (PRENATAL MULTIVITAMIN) TABS tablet Take 1 tablet by mouth daily at 12 noon.      . valACYclovir (VALTREX) 500 MG tablet TAKE ONE TABLET BY MOUTH TWICE DAILY  20 tablet  0   No current facility-administered medications for this visit.    SURGICAL HISTORY:  Past Surgical History  Procedure Laterality Date  . Wisdom tooth extraction    . Portacath placement Left 09/20/2012    Procedure: INSERTION PORT-A-CATH;  Surgeon: [name], MD;  Location: [facility];  Service: General;  Laterality: Left;    REVIEW OF SYSTEMS:  A 10 point review of systems was completed and is negative except as stated above.   PHYSICAL EXAMINATION: Blood pressure 123/76, pulse 92, temperature 97.9 F (36.6 C), temperature source Oral, resp. rate 20,  height 5' 5" (1.651 m), weight 196 lb 6.4 oz (89.086 kg), last menstrual period 10/28/2012. Body mass index is 32.68 kg/(m^2). GENERAL: Patient is a well appearing female in no acute distress HEENT:  Sclerae anicteric.  Oropharynx clear and moist. No ulcerations or evidence of oropharyngeal candidiasis. Neck is supple.  NODES:  No cervical, supraclavicular, or axillary lymphadenopathy palpated.  BREAST EXAM: right breast nodule at 6 is softer,a bout 1cm LUNGS:  Clear to auscultation bilaterally.  No wheezes or rhonchi. HEART:  Regular rate  and rhythm. No murmur appreciated. ABDOMEN:  Soft, nontender.  Positive, normoactive bowel sounds. No organomegaly palpated. MSK:  No focal spinal tenderness to palpation. Full range of motion bilaterally in the upper extremities. EXTREMITIES:  No peripheral edema.   SKIN:  Clear with no obvious rashes or skin changes. No nail dyscrasia. NEURO:  Nonfocal. Well oriented.  Appropriate affect. ECOG FS: 1 - Symptomatic but completely ambulatory  LABORATORY DATA: Lab Results  Component Value Date   WBC 7.9 03/17/2013   HGB 7.9* 03/17/2013   HCT 23.6* 03/17/2013   MCV 86.1 03/17/2013   PLT 227 03/17/2013      Chemistry      Component Value Date/Time   NA 142 03/17/2013 0815   NA 136 10/26/2012 0918   K 3.8 03/17/2013 0815   K 3.3* 10/26/2012 0918   CL 101 10/26/2012 0918   CO2 26 03/17/2013 0815   CO2 27 10/26/2012 0918   BUN 11.0 03/17/2013 0815   BUN 15 10/26/2012 0918   CREATININE 0.7 03/17/2013 0815   CREATININE 0.60 10/26/2012 0918   GLU 124* 03/10/2013 1438      Component Value Date/Time   CALCIUM 9.4 03/17/2013 0815   CALCIUM 8.4 10/26/2012 0918   ALKPHOS 74 03/17/2013 0815   ALKPHOS 118* 10/26/2012 0918   AST 19 03/17/2013 0815   AST 12 10/26/2012 0918   ALT 22 03/17/2013 0815   ALT 18 10/26/2012 0918   BILITOT 1.29* 03/17/2013 0815   BILITOT 0.6 10/26/2012 0918       RADIOGRAPHIC STUDIES: No results found.   ASSESSMENT/PLAN:   Mary Dougherty is a 43 y.o. female with:   #1 First ever mammogram found an abnormal mass in the right breast.  Ultrasound confirmed the mass to be 1.4 cm at the 6 o'clock position.   MRI revealed the mass to be a little bit bigger at 2.7 cm. Biopsy showed an invasive ductal carcinoma, grade 2, estrogen receptor/progesterone receptor positive, HER-2/neu negative, with a Ki-67 of 70%.   #2  patient was begun on neoadjuvant chemotherapy initially consisting of 6 cycles of FEC which he completed. Without any significant problems. She is now on weekly Taxol. She  is here for week #12. Overall she's tolerating it well. Her blood counts are slightly lower again.  She will return tomorrow for 2 units of PRBCs.    #3 patient was seen by Dr. Wakefield. He is planning on doing her surgery on March 5. She had an MRI performed that revealed a good response with residual disease of about 1.4 cm.  #4 Acute on chronic anemia and GI bleeding (which has essentially resolved by mid November, 2014) - patient completed blood transfusion of 2 units of PRBCs on 12/09/2012 and consult with gastroenterologist Malcolm Stark, M.D on 12/12/2012 .  She also received an additional two units of PRBCs on 01/14/13.  Patient has a blast in her peripheral blood and this was first noticed on 12/26 and then   again on 02/03/13.  She has no blasts on today's morphology.  She does inform us that she has a h/o hereditary spherocytosis. She will eventually need a full workup for hereditary spherocytosis. We discussed the possibility of splenectomy eventually versus other therapies.  #5 patient will be seen back next week for labs and evaluation.  We will also see her prior to surgery for lab only.    All questions answered. Mary Dougherty and her sister were encouraged to contact us in the interim with any questions, concerns, or problems.   I spent 25 minutes counseling the patient face to face.  The total time spent in the appointment was 30 minutes.  Mary N. Cornetto, NP Medical Oncology [facility] [phone] 03/19/2013, 9:35 AM 

## 2013-03-17 NOTE — Patient Instructions (Signed)
[facility] Discharge Instructions for Patients Receiving Chemotherapy  Today you received the following chemotherapy agents; Taxol.    To help prevent nausea and vomiting after your treatment, we encourage you to take your nausea medication as directed.    If you develop nausea and vomiting that is not controlled by your nausea medication, call the clinic.   BELOW ARE SYMPTOMS THAT SHOULD BE REPORTED IMMEDIATELY:  *FEVER GREATER THAN 100.5 F  *CHILLS WITH OR WITHOUT FEVER  NAUSEA AND VOMITING THAT IS NOT CONTROLLED WITH YOUR NAUSEA MEDICATION  *UNUSUAL SHORTNESS OF BREATH  *UNUSUAL BRUISING OR BLEEDING  TENDERNESS IN MOUTH AND THROAT WITH OR WITHOUT PRESENCE OF ULCERS  *URINARY PROBLEMS  *BOWEL PROBLEMS  UNUSUAL RASH Items with * indicate a potential emergency and should be followed up as soon as possible.  Feel free to call the clinic you have any questions or concerns. The clinic phone number is [phone].  CONGRATULATIONS ON COMPLETING YOUR TREATMENT!!!

## 2013-03-18 ENCOUNTER — Ambulatory Visit (HOSPITAL_BASED_OUTPATIENT_CLINIC_OR_DEPARTMENT_OTHER): Payer: BC Managed Care – PPO

## 2013-03-18 VITALS — BP 115/68 | HR 80 | Temp 97.7°F | Resp 19

## 2013-03-18 DIAGNOSIS — D649 Anemia, unspecified: Secondary | ICD-10-CM

## 2013-03-18 LAB — PREPARE RBC (CROSSMATCH)

## 2013-03-18 MED ORDER — ACETAMINOPHEN 325 MG PO TABS
ORAL_TABLET | ORAL | Status: AC
  Filled 2013-03-18: qty 2

## 2013-03-18 MED ORDER — SODIUM CHLORIDE 0.9 % IV SOLN
250.0000 mL | Freq: Once | INTRAVENOUS | Status: AC
  Administered 2013-03-18: 250 mL via INTRAVENOUS

## 2013-03-18 MED ORDER — DIPHENHYDRAMINE HCL 25 MG PO CAPS
ORAL_CAPSULE | ORAL | Status: AC
  Filled 2013-03-18: qty 1

## 2013-03-18 MED ORDER — ACETAMINOPHEN 325 MG PO TABS
650.0000 mg | ORAL_TABLET | Freq: Once | ORAL | Status: AC
  Administered 2013-03-18: 650 mg via ORAL

## 2013-03-18 MED ORDER — DIPHENHYDRAMINE HCL 25 MG PO CAPS
25.0000 mg | ORAL_CAPSULE | Freq: Once | ORAL | Status: AC
  Administered 2013-03-18: 25 mg via ORAL

## 2013-03-18 MED ORDER — HEPARIN SOD (PORK) LOCK FLUSH 100 UNIT/ML IV SOLN
500.0000 [IU] | Freq: Every day | INTRAVENOUS | Status: AC | PRN
  Administered 2013-03-18: 500 [IU]
  Filled 2013-03-18: qty 5

## 2013-03-18 MED ORDER — SODIUM CHLORIDE 0.9 % IJ SOLN
10.0000 mL | INTRAMUSCULAR | Status: AC | PRN
  Administered 2013-03-18: 10 mL
  Filled 2013-03-18: qty 10

## 2013-03-18 NOTE — Patient Instructions (Signed)
Blood Transfusion Information WHAT IS A BLOOD TRANSFUSION? A transfusion is the replacement of blood or some of its parts. Blood is made up of multiple cells which provide different functions.  Red blood cells carry oxygen and are used for blood loss replacement.  White blood cells fight against infection.  Platelets control bleeding.  Plasma helps clot blood.  Other blood products are available for specialized needs, such as hemophilia or other clotting disorders. BEFORE THE TRANSFUSION  Who gives blood for transfusions?   You may be able to donate blood to be used at a later date on yourself (autologous donation).  Relatives can be asked to donate blood. This is generally not any safer than if you have received blood from a stranger. The same precautions are taken to ensure safety when a relative's blood is donated.  Healthy volunteers who are fully evaluated to make sure their blood is safe. This is blood bank blood. Transfusion therapy is the safest it has ever been in the practice of medicine. Before blood is taken from a donor, a complete history is taken to make sure that person has no history of diseases nor engages in risky social behavior (examples are intravenous drug use or sexual activity with multiple partners). The donor's travel history is screened to minimize risk of transmitting infections, such as malaria. The donated blood is tested for signs of infectious diseases, such as HIV and hepatitis. The blood is then tested to be sure it is compatible with you in order to minimize the chance of a transfusion reaction. If you or a relative donates blood, this is often done in anticipation of surgery and is not appropriate for emergency situations. It takes many days to process the donated blood. RISKS AND COMPLICATIONS Although transfusion therapy is very safe and saves many lives, the main dangers of transfusion include:   Getting an infectious disease.  Developing a  transfusion reaction. This is an allergic reaction to something in the blood you were given. Every precaution is taken to prevent this. The decision to have a blood transfusion has been considered carefully by your caregiver before blood is given. Blood is not given unless the benefits outweigh the risks. AFTER THE TRANSFUSION  Right after receiving a blood transfusion, you will usually feel much better and more energetic. This is especially true if your red blood cells have gotten low (anemic). The transfusion raises the level of the red blood cells which carry oxygen, and this usually causes an energy increase.  The nurse administering the transfusion will monitor you carefully for complications. HOME CARE INSTRUCTIONS  No special instructions are needed after a transfusion. You may find your energy is better. Speak with your caregiver about any limitations on activity for underlying diseases you may have. SEEK MEDICAL CARE IF:   Your condition is not improving after your transfusion.  You develop redness or irritation at the intravenous (IV) site. SEEK IMMEDIATE MEDICAL CARE IF:  Any of the following symptoms occur over the next 12 hours:  Shaking chills.  You have a temperature by mouth above 102 F (38.9 C), not controlled by medicine.  Chest, back, or muscle pain.  People around you feel you are not acting correctly or are confused.  Shortness of breath or difficulty breathing.  Dizziness and fainting.  You get a rash or develop hives.  You have a decrease in urine output.  Your urine turns a dark color or changes to pink, red, or brown. Any of the following   symptoms occur over the next 10 days:  You have a temperature by mouth above 102 F (38.9 C), not controlled by medicine.  Shortness of breath.  Weakness after normal activity.  The white part of the eye turns yellow (jaundice).  You have a decrease in the amount of urine or are urinating less often.  Your  urine turns a dark color or changes to pink, red, or brown. Document Released: 01/17/2000 Document Revised: 04/13/2011 Document Reviewed: 09/05/2007 Exit[employer] Patient Information 2014 [employer].  

## 2013-03-19 LAB — TYPE AND SCREEN
ABO/RH(D): B POS
Antibody Screen: NEGATIVE
UNIT DIVISION: 0
Unit division: 0

## 2013-03-21 ENCOUNTER — Other Ambulatory Visit: Payer: BC Managed Care – PPO

## 2013-03-21 ENCOUNTER — Ambulatory Visit: Payer: BC Managed Care – PPO | Admitting: Adult Health

## 2013-03-21 ENCOUNTER — Telehealth: Payer: Self-pay | Admitting: Oncology

## 2013-03-21 NOTE — Telephone Encounter (Signed)
, °

## 2013-03-22 ENCOUNTER — Telehealth: Payer: Self-pay | Admitting: Oncology

## 2013-03-22 NOTE — Telephone Encounter (Signed)
, °

## 2013-03-23 ENCOUNTER — Other Ambulatory Visit: Payer: BC Managed Care – PPO

## 2013-03-23 ENCOUNTER — Ambulatory Visit: Payer: BC Managed Care – PPO | Admitting: Adult Health

## 2013-03-24 ENCOUNTER — Other Ambulatory Visit: Payer: BC Managed Care – PPO

## 2013-03-24 ENCOUNTER — Ambulatory Visit: Payer: BC Managed Care – PPO | Admitting: Adult Health

## 2013-03-28 ENCOUNTER — Encounter: Payer: Self-pay | Admitting: Adult Health

## 2013-03-28 ENCOUNTER — Ambulatory Visit (HOSPITAL_BASED_OUTPATIENT_CLINIC_OR_DEPARTMENT_OTHER): Payer: BC Managed Care – PPO | Admitting: Adult Health

## 2013-03-28 ENCOUNTER — Other Ambulatory Visit (HOSPITAL_BASED_OUTPATIENT_CLINIC_OR_DEPARTMENT_OTHER): Payer: BC Managed Care – PPO

## 2013-03-28 VITALS — BP 128/78 | HR 93 | Temp 97.1°F | Resp 18 | Ht 65.0 in | Wt 201.3 lb

## 2013-03-28 DIAGNOSIS — C50511 Malignant neoplasm of lower-outer quadrant of right female breast: Secondary | ICD-10-CM

## 2013-03-28 DIAGNOSIS — D649 Anemia, unspecified: Secondary | ICD-10-CM

## 2013-03-28 DIAGNOSIS — C50919 Malignant neoplasm of unspecified site of unspecified female breast: Secondary | ICD-10-CM

## 2013-03-28 DIAGNOSIS — Z170 Estrogen receptor positive status [ER+]: Secondary | ICD-10-CM

## 2013-03-28 LAB — COMPREHENSIVE METABOLIC PANEL (CC13)
ALBUMIN: 4 g/dL (ref 3.5–5.0)
ALT: 24 U/L (ref 0–55)
AST: 16 U/L (ref 5–34)
Alkaline Phosphatase: 71 U/L (ref 40–150)
Anion Gap: 11 mEq/L (ref 3–11)
BILIRUBIN TOTAL: 1.05 mg/dL (ref 0.20–1.20)
BUN: 9.5 mg/dL (ref 7.0–26.0)
CO2: 26 mEq/L (ref 22–29)
Calcium: 9.4 mg/dL (ref 8.4–10.4)
Chloride: 105 mEq/L (ref 98–109)
Creatinine: 0.7 mg/dL (ref 0.6–1.1)
Glucose: 114 mg/dl (ref 70–140)
POTASSIUM: 4 meq/L (ref 3.5–5.1)
SODIUM: 142 meq/L (ref 136–145)
TOTAL PROTEIN: 6.6 g/dL (ref 6.4–8.3)

## 2013-03-28 LAB — MORPHOLOGY: PLT EST: ADEQUATE

## 2013-03-28 LAB — CBC WITH DIFFERENTIAL/PLATELET
BASO%: 0.4 % (ref 0.0–2.0)
Basophils Absolute: 0 10*3/uL (ref 0.0–0.1)
EOS%: 1.9 % (ref 0.0–7.0)
Eosinophils Absolute: 0.2 10*3/uL (ref 0.0–0.5)
HCT: 31.7 % — ABNORMAL LOW (ref 34.8–46.6)
HGB: 10.7 g/dL — ABNORMAL LOW (ref 11.6–15.9)
LYMPH%: 14.2 % (ref 14.0–49.7)
MCH: 30.4 pg (ref 25.1–34.0)
MCHC: 33.9 g/dL (ref 31.5–36.0)
MCV: 89.8 fL (ref 79.5–101.0)
MONO#: 0.6 10*3/uL (ref 0.1–0.9)
MONO%: 7.7 % (ref 0.0–14.0)
NEUT#: 6.3 10*3/uL (ref 1.5–6.5)
NEUT%: 75.8 % (ref 38.4–76.8)
PLATELETS: 219 10*3/uL (ref 145–400)
RBC: 3.53 10*6/uL — ABNORMAL LOW (ref 3.70–5.45)
RDW: 25.5 % — AB (ref 11.2–14.5)
WBC: 8.4 10*3/uL (ref 3.9–10.3)
lymph#: 1.2 10*3/uL (ref 0.9–3.3)

## 2013-03-28 NOTE — Progress Notes (Signed)
[place]  Telephone:(336) [phone] Fax:(336) [phone]  OFFICE PROGRESS NOTE    ID: Drexel Iha   DOB: 01-08-1971  MR#: [id number]  HYW#:[id number]   PCP: Jenny Reichmann, MD GI: Lucio Edward, M.D.   DIAGNOSIS:  Mary Dougherty is a 43 y.o. female diagnosed in 09/2012 with invasive ductal carcinoma of the right breast that is estrogen receptor positive, progesterone receptor positive, HER-2/neu negative.   PRIOR THERAPY: 1.  Patient went for her initial screening mammogram at the insistence of her sister. She was found to have a mass in the right breast. The ultrasound demonstrated a 1.4 cm area of concern. She had an MRI performed that revealed a 2.7 cm irregular area at the 6 o'clock location. There were no masses in the left breast no abnormal lymph nodes. Patient had a biopsy performed of the right breast that revealed an intermediate to high-grade invasive ductal carcinoma the tumor was estrogen receptor positive 99%, progesterone receptor positive 100%,  HER-2/neu negative with a Ki-67 of 70%.   2.Patient received neoadjuvant chemotherapy with dose dense FEC (5-FU/epirubicin/Cytoxan) from 10/07/2012 through 12/16/12.  She started weekly Taxol on 12/30/12 and completed chemotherapy on 03/17/13.    3.  Acute on chronic anemia.  A few of her previous CBCs have had 1-2 blasts and NRBCs on them.  A work up is underway.  Per review of records by Dr. Tressie Stalker she has previously been diagnosed with hereditary spherocytosis.     CURRENT THERAPY: Pre surgery  INTERVAL HISTORY: Mary Dougherty is a  43 y.o. female who returns for labs and evaluation following her final cycle of Taxol.  She is scheduled for surgery on 04/06/13.  She is doing well and has no residual complaints or symptoms from receiving chemotherapy with the exception of alopecia.  Otherwise, she is doing well and a 10 point ROS is negative.    MEDICAL HISTORY: Past Medical History  Diagnosis Date  .  Anemia   . Thyroid disease   . Wears glasses   . Breast cancer 2014    ER+/PR+/Her2-  . Spherocytosis 1989    ALLERGIES:   No Known Allergies   MEDICATIONS:  Current Outpatient Prescriptions  Medication Sig Dispense Refill  . Cholecalciferol (VITAMIN D3) 1000 UNITS CAPS Take 1 capsule by mouth daily.      . folic acid (FOLVITE) 485 MCG tablet Take 400 mcg by mouth 2 (two) times daily.       Marland Kitchen lidocaine-prilocaine (EMLA) cream Apply topically as needed.  30 g  6  . Prenatal Vit-Fe Fumarate-FA (PRENATAL MULTIVITAMIN) TABS tablet Take 1 tablet by mouth daily at 12 noon.      . docusate sodium (COLACE) 100 MG capsule Take 100 mg by mouth 3 (three) times daily as needed.       . valACYclovir (VALTREX) 500 MG tablet TAKE ONE TABLET BY MOUTH TWICE DAILY  20 tablet  0   No current facility-administered medications for this visit.    SURGICAL HISTORY:  Past Surgical History  Procedure Laterality Date  . Wisdom tooth extraction    . Portacath placement Left 09/20/2012    Procedure: INSERTION PORT-A-CATH;  Surgeon: [name], MD;  Location: [facility];  Service: General;  Laterality: Left;    REVIEW OF SYSTEMS:  A 10 point review of systems was completed and is negative except as stated above.   PHYSICAL EXAMINATION: Blood pressure 128/78, pulse 93, temperature 97.1 F (36.2 C), temperature source  Oral, resp. rate 18, height $RemoveBe'5\' 5"'cdBIyoprQ$  (1.651 m), weight 201 lb 4.8 oz (91.309 kg), last menstrual period 10/28/2012. Body mass index is 33.5 kg/(m^2). GENERAL: Patient is a well appearing female in no acute distress HEENT:  Sclerae anicteric.  Oropharynx clear and moist. No ulcerations or evidence of oropharyngeal candidiasis. Neck is supple.  NODES:  No cervical, supraclavicular, or axillary lymphadenopathy palpated.  BREAST EXAM: deferred LUNGS:  Clear to auscultation bilaterally.  No wheezes or rhonchi. HEART:  Regular rate and rhythm. No murmur appreciated. ABDOMEN:   Soft, nontender.  Positive, normoactive bowel sounds. No organomegaly palpated. MSK:  No focal spinal tenderness to palpation. Full range of motion bilaterally in the upper extremities. EXTREMITIES:  No peripheral edema.   SKIN:  Clear with no obvious rashes or skin changes. No nail dyscrasia. NEURO:  Nonfocal. Well oriented.  Appropriate affect. ECOG FS: 1 - Symptomatic but completely ambulatory  LABORATORY DATA: Lab Results  Component Value Date   WBC 8.4 03/28/2013   HGB 10.7* 03/28/2013   HCT 31.7* 03/28/2013   MCV 89.8 03/28/2013   PLT 219 03/28/2013      Chemistry      Component Value Date/Time   NA 142 03/28/2013 1500   NA 136 10/26/2012 0918   K 4.0 03/28/2013 1500   K 3.3* 10/26/2012 0918   CL 101 10/26/2012 0918   CO2 26 03/28/2013 1500   CO2 27 10/26/2012 0918   BUN 9.5 03/28/2013 1500   BUN 15 10/26/2012 0918   CREATININE 0.7 03/28/2013 1500   CREATININE 0.60 10/26/2012 0918   GLU 124* 03/10/2013 1438      Component Value Date/Time   CALCIUM 9.4 03/28/2013 1500   CALCIUM 8.4 10/26/2012 0918   ALKPHOS 71 03/28/2013 1500   ALKPHOS 118* 10/26/2012 0918   AST 16 03/28/2013 1500   AST 12 10/26/2012 0918   ALT 24 03/28/2013 1500   ALT 18 10/26/2012 0918   BILITOT 1.05 03/28/2013 1500   BILITOT 0.6 10/26/2012 0918       RADIOGRAPHIC STUDIES: No results found.   ASSESSMENT/PLAN:   RACHYL WUEBKER is a 43 y.o. female with:   #1 First ever mammogram found an abnormal mass in the right breast.  Ultrasound confirmed the mass to be 1.4 cm at the 6 o'clock position.   MRI revealed the mass to be a little bit bigger at 2.7 cm. Biopsy showed an invasive ductal carcinoma, grade 2, estrogen receptor/progesterone receptor positive, HER-2/neu negative, with a Ki-67 of 70%.   #2  Patient is s/p neoadjuvant chemotherapy consisting of 6 cycles of FEC and weekly taxol x 12 weeks.  She completed chemotherapy on 03/17/13.      #3 patient was seen by Dr. Donne Hazel. He is planning on doing her surgery  on March 5. She had an MRI performed that revealed a good response with residual disease of about 1.4 cm.  #4 Acute on chronic anemia and GI bleeding (which has essentially resolved by mid November, 2014) - patient completed blood transfusion of 2 units of PRBCs on 12/09/2012 and consult with gastroenterologist Lucio Edward, M.D on 12/12/2012 .  She also received an additional two units of PRBCs on 01/14/13.  Patient has a blast in her peripheral blood and this was first noticed on 12/26 and then again on 02/03/13.  She has no blasts on today's morphology.  She does inform us that she has a h/o hereditary spherocytosis. She will eventually need a full workup for hereditary spherocytosis.  We discussed the possibility of splenectomy eventually versus other therapies.  #5 The patient will return following her surgery to discuss her pathology with Dr. Humphrey Rolls.  .    All questions answered. Mary Dougherty and her sister were encouraged to contact us in the interim with any questions, concerns, or problems.   I spent 15 minutes counseling the patient face to face.  The total time spent in the appointment was 30 minutes.  Minette Headland, [place] [phone] 03/30/2013, 1:43 PM

## 2013-03-31 ENCOUNTER — Telehealth: Payer: Self-pay | Admitting: Oncology

## 2013-03-31 ENCOUNTER — Encounter (HOSPITAL_BASED_OUTPATIENT_CLINIC_OR_DEPARTMENT_OTHER): Payer: Self-pay | Admitting: *Deleted

## 2013-03-31 NOTE — Progress Notes (Signed)
Had labs done 03/28/13-going for seeds 04/05/13-here 04/06/13

## 2013-03-31 NOTE — Telephone Encounter (Signed)
, °

## 2013-04-03 ENCOUNTER — Encounter (HOSPITAL_COMMUNITY): Admission: RE | Payer: Self-pay | Source: Ambulatory Visit

## 2013-04-03 ENCOUNTER — Other Ambulatory Visit (HOSPITAL_COMMUNITY): Payer: BC Managed Care – PPO

## 2013-04-03 ENCOUNTER — Ambulatory Visit (HOSPITAL_COMMUNITY): Admission: RE | Admit: 2013-04-03 | Payer: BC Managed Care – PPO | Source: Ambulatory Visit | Admitting: General Surgery

## 2013-04-03 SURGERY — RADIOACTIVE SEED GUIDED PARTIAL MASTECTOMY WITH AXILLARY SENTINEL LYMPH NODE BIOPSY AND AXILLARY LYMPH NODE DISSECTION
Anesthesia: General | Laterality: Right

## 2013-04-05 ENCOUNTER — Ambulatory Visit
Admission: RE | Admit: 2013-04-05 | Discharge: 2013-04-05 | Disposition: A | Discharge status: Home / Self Care | Payer: BC Managed Care – PPO | Source: Ambulatory Visit | Attending: General Surgery | Admitting: General Surgery

## 2013-04-05 DIAGNOSIS — C50511 Malignant neoplasm of lower-outer quadrant of right female breast: Secondary | ICD-10-CM

## 2013-04-06 ENCOUNTER — Encounter (HOSPITAL_COMMUNITY)
Admission: RE | Admit: 2013-04-06 | Discharge: 2013-04-06 | Disposition: A | Discharge status: Home / Self Care | Payer: BC Managed Care – PPO | Source: Ambulatory Visit | Attending: General Surgery | Admitting: General Surgery

## 2013-04-06 ENCOUNTER — Ambulatory Visit
Admission: RE | Admit: 2013-04-06 | Discharge: 2013-04-06 | Disposition: A | Discharge status: Home / Self Care | Payer: BC Managed Care – PPO | Source: Ambulatory Visit

## 2013-04-06 ENCOUNTER — Ambulatory Visit (HOSPITAL_BASED_OUTPATIENT_CLINIC_OR_DEPARTMENT_OTHER): Payer: BC Managed Care – PPO | Admitting: Anesthesiology

## 2013-04-06 ENCOUNTER — Encounter (HOSPITAL_BASED_OUTPATIENT_CLINIC_OR_DEPARTMENT_OTHER): Payer: Self-pay

## 2013-04-06 ENCOUNTER — Encounter (HOSPITAL_BASED_OUTPATIENT_CLINIC_OR_DEPARTMENT_OTHER): Payer: BC Managed Care – PPO | Admitting: Anesthesiology

## 2013-04-06 ENCOUNTER — Encounter (HOSPITAL_BASED_OUTPATIENT_CLINIC_OR_DEPARTMENT_OTHER)
Admission: RE | Disposition: A | Discharge status: Home / Self Care | Payer: Self-pay | Source: Ambulatory Visit | Attending: General Surgery

## 2013-04-06 ENCOUNTER — Ambulatory Visit (HOSPITAL_BASED_OUTPATIENT_CLINIC_OR_DEPARTMENT_OTHER)
Admission: RE | Admit: 2013-04-06 | Discharge: 2013-04-06 | Disposition: A | Discharge status: Home / Self Care | Payer: BC Managed Care – PPO | Source: Ambulatory Visit | Attending: General Surgery | Admitting: General Surgery

## 2013-04-06 DIAGNOSIS — D0590 Unspecified type of carcinoma in situ of unspecified breast: Secondary | ICD-10-CM

## 2013-04-06 DIAGNOSIS — Z170 Estrogen receptor positive status [ER+]: Secondary | ICD-10-CM | POA: Insufficient documentation

## 2013-04-06 DIAGNOSIS — Z452 Encounter for adjustment and management of vascular access device: Secondary | ICD-10-CM

## 2013-04-06 DIAGNOSIS — C50519 Malignant neoplasm of lower-outer quadrant of unspecified female breast: Secondary | ICD-10-CM | POA: Insufficient documentation

## 2013-04-06 DIAGNOSIS — C773 Secondary and unspecified malignant neoplasm of axilla and upper limb lymph nodes: Secondary | ICD-10-CM | POA: Insufficient documentation

## 2013-04-06 DIAGNOSIS — C50911 Malignant neoplasm of unspecified site of right female breast: Secondary | ICD-10-CM

## 2013-04-06 DIAGNOSIS — C50511 Malignant neoplasm of lower-outer quadrant of right female breast: Secondary | ICD-10-CM

## 2013-04-06 DIAGNOSIS — D649 Anemia, unspecified: Secondary | ICD-10-CM | POA: Insufficient documentation

## 2013-04-06 DIAGNOSIS — E079 Disorder of thyroid, unspecified: Secondary | ICD-10-CM | POA: Insufficient documentation

## 2013-04-06 HISTORY — PX: PORT-A-CATH REMOVAL: SHX5289

## 2013-04-06 HISTORY — PX: BREAST LUMPECTOMY WITH SENTINEL LYMPH NODE BIOPSY: SHX5597

## 2013-04-06 LAB — POCT HEMOGLOBIN-HEMACUE: HEMOGLOBIN: 10.9 g/dL — AB (ref 12.0–15.0)

## 2013-04-06 SURGERY — RADIOACTIVE SEED GUIDED PARTIAL MASTECTOMY WITH AXILLARY SENTINEL LYMPH NODE BIOPSY AND AXILLARY LYMPH NODE DISSECTION
Anesthesia: General | Site: Breast | Laterality: Right

## 2013-04-06 MED ORDER — PROPOFOL 10 MG/ML IV BOLUS
INTRAVENOUS | Status: AC
  Filled 2013-04-06: qty 40

## 2013-04-06 MED ORDER — OXYCODONE HCL 5 MG PO TABS
ORAL_TABLET | ORAL | Status: AC
  Filled 2013-04-06: qty 1

## 2013-04-06 MED ORDER — HYDROMORPHONE HCL PF 1 MG/ML IJ SOLN
0.2500 mg | INTRAMUSCULAR | Status: DC | PRN
  Administered 2013-04-06: 0.5 mg via INTRAVENOUS

## 2013-04-06 MED ORDER — BUPIVACAINE-EPINEPHRINE PF 0.25-1:200000 % IJ SOLN
INTRAMUSCULAR | Status: AC
  Filled 2013-04-06: qty 30

## 2013-04-06 MED ORDER — BUPIVACAINE HCL (PF) 0.25 % IJ SOLN
INTRAMUSCULAR | Status: DC | PRN
  Administered 2013-04-06: 30 mL

## 2013-04-06 MED ORDER — FENTANYL CITRATE 0.05 MG/ML IJ SOLN
INTRAMUSCULAR | Status: DC | PRN
  Administered 2013-04-06 (×2): 25 ug via INTRAVENOUS
  Administered 2013-04-06: 50 ug via INTRAVENOUS

## 2013-04-06 MED ORDER — MIDAZOLAM HCL 2 MG/2ML IJ SOLN
INTRAMUSCULAR | Status: AC
  Filled 2013-04-06: qty 2

## 2013-04-06 MED ORDER — OXYCODONE HCL 5 MG/5ML PO SOLN: 5.0000 mg | Freq: Once | ORAL | Status: AC | PRN

## 2013-04-06 MED ORDER — MIDAZOLAM HCL 2 MG/2ML IJ SOLN
1.0000 mg | INTRAMUSCULAR | Status: DC | PRN
Start: 2013-04-06 — End: 2013-04-06
  Administered 2013-04-06: 1 mg via INTRAVENOUS

## 2013-04-06 MED ORDER — LACTATED RINGERS IV SOLN
INTRAVENOUS | Status: DC
Start: 2013-04-06 — End: 2013-04-06
  Administered 2013-04-06 (×2): via INTRAVENOUS

## 2013-04-06 MED ORDER — PROPOFOL 10 MG/ML IV EMUL
INTRAVENOUS | Status: AC
  Filled 2013-04-06: qty 50

## 2013-04-06 MED ORDER — OXYCODONE HCL 5 MG PO TABS
5.0000 mg | ORAL_TABLET | Freq: Once | ORAL | Status: AC | PRN
  Administered 2013-04-06: 5 mg via ORAL

## 2013-04-06 MED ORDER — FENTANYL CITRATE 0.05 MG/ML IJ SOLN
INTRAMUSCULAR | Status: AC
  Filled 2013-04-06: qty 6

## 2013-04-06 MED ORDER — FENTANYL CITRATE 0.05 MG/ML IJ SOLN
50.0000 ug | INTRAMUSCULAR | Status: DC | PRN
  Administered 2013-04-06: 50 ug via INTRAVENOUS

## 2013-04-06 MED ORDER — TECHNETIUM TC 99M SULFUR COLLOID FILTERED
1.0000 | Freq: Once | INTRAVENOUS | Status: AC | PRN
  Administered 2013-04-06: 1 via INTRADERMAL

## 2013-04-06 MED ORDER — OXYCODONE-ACETAMINOPHEN 10-325 MG PO TABS: 1.0000 | ORAL_TABLET | Freq: Four times a day (QID) | ORAL | Status: DC | PRN

## 2013-04-06 MED ORDER — LIDOCAINE HCL (CARDIAC) 20 MG/ML IV SOLN
INTRAVENOUS | Status: DC | PRN
  Administered 2013-04-06: 100 mg via INTRAVENOUS

## 2013-04-06 MED ORDER — SODIUM CHLORIDE 0.9 % IJ SOLN
INTRAMUSCULAR | Status: DC | PRN
  Administered 2013-04-06: 08:00:00

## 2013-04-06 MED ORDER — CEFAZOLIN SODIUM-DEXTROSE 2-3 GM-% IV SOLR
INTRAVENOUS | Status: AC
  Filled 2013-04-06: qty 50

## 2013-04-06 MED ORDER — HYDROMORPHONE HCL PF 1 MG/ML IJ SOLN
INTRAMUSCULAR | Status: AC
  Filled 2013-04-06: qty 1

## 2013-04-06 MED ORDER — DEXAMETHASONE SODIUM PHOSPHATE 4 MG/ML IJ SOLN
INTRAMUSCULAR | Status: DC | PRN
  Administered 2013-04-06: 10 mg via INTRAVENOUS

## 2013-04-06 MED ORDER — CEFAZOLIN SODIUM-DEXTROSE 2-3 GM-% IV SOLR
2.0000 g | INTRAVENOUS | Status: AC
  Administered 2013-04-06: 2 g via INTRAVENOUS

## 2013-04-06 MED ORDER — ONDANSETRON HCL 4 MG/2ML IJ SOLN
INTRAMUSCULAR | Status: DC | PRN
  Administered 2013-04-06: 4 mg via INTRAVENOUS

## 2013-04-06 MED ORDER — PROPOFOL 10 MG/ML IV BOLUS
INTRAVENOUS | Status: DC | PRN
  Administered 2013-04-06: 200 mg via INTRAVENOUS

## 2013-04-06 MED ORDER — SUCCINYLCHOLINE CHLORIDE 20 MG/ML IJ SOLN
INTRAMUSCULAR | Status: AC
  Filled 2013-04-06: qty 1

## 2013-04-06 MED ORDER — METOCLOPRAMIDE HCL 5 MG/ML IJ SOLN: 10.0000 mg | Freq: Once | INTRAMUSCULAR | Status: DC | PRN

## 2013-04-06 MED ORDER — FENTANYL CITRATE 0.05 MG/ML IJ SOLN
INTRAMUSCULAR | Status: AC
  Filled 2013-04-06: qty 2

## 2013-04-06 MED ORDER — SODIUM CHLORIDE 0.9 % IJ SOLN
INTRAMUSCULAR | Status: AC
  Filled 2013-04-06: qty 10

## 2013-04-06 MED ORDER — METHYLENE BLUE 1 % INJ SOLN
INTRAMUSCULAR | Status: AC
  Filled 2013-04-06: qty 10

## 2013-04-06 MED ORDER — 0.9 % SODIUM CHLORIDE (POUR BTL) OPTIME
TOPICAL | Status: DC | PRN
  Administered 2013-04-06: 500 mL

## 2013-04-06 SURGICAL SUPPLY — 66 items
ADH SKN CLS APL DERMABOND .7 (GAUZE/BANDAGES/DRESSINGS) ×4
APL SKNCLS STERI-STRIP NONHPOA (GAUZE/BANDAGES/DRESSINGS) ×2
APPLIER CLIP 9.375 MED OPEN (MISCELLANEOUS) ×3
APR CLP MED 9.3 20 MLT OPN (MISCELLANEOUS) ×2
BENZOIN TINCTURE PRP APPL 2/3 (GAUZE/BANDAGES/DRESSINGS) ×3 IMPLANT
BINDER BREAST LRG (GAUZE/BANDAGES/DRESSINGS) IMPLANT
BINDER BREAST MEDIUM (GAUZE/BANDAGES/DRESSINGS) IMPLANT
BINDER BREAST XLRG (GAUZE/BANDAGES/DRESSINGS) ×3 IMPLANT
BINDER BREAST XXLRG (GAUZE/BANDAGES/DRESSINGS) IMPLANT
BLADE SURG 15 STRL LF DISP TIS (BLADE) ×4 IMPLANT
BLADE SURG 15 STRL SS (BLADE) ×6
CANISTER SUC SOCK COL 7IN (MISCELLANEOUS) ×3 IMPLANT
CANISTER SUCT 1200ML W/VALVE (MISCELLANEOUS) ×2 IMPLANT
CHLORAPREP W/TINT 26ML (MISCELLANEOUS) ×3 IMPLANT
CLIP APPLIE 9.375 MED OPEN (MISCELLANEOUS) ×2 IMPLANT
COVER MAYO STAND STRL (DRAPES) ×3 IMPLANT
COVER PROBE W GEL 5X96 (DRAPES) ×3 IMPLANT
COVER TABLE BACK 60X90 (DRAPES) ×3 IMPLANT
DECANTER SPIKE VIAL GLASS SM (MISCELLANEOUS) ×2 IMPLANT
DERMABOND ADVANCED (GAUZE/BANDAGES/DRESSINGS) ×2
DERMABOND ADVANCED .7 DNX12 (GAUZE/BANDAGES/DRESSINGS) ×2 IMPLANT
DEVICE DUBIN W/COMP PLATE 8390 (MISCELLANEOUS) ×3 IMPLANT
DRAIN CHANNEL 19F RND (DRAIN) IMPLANT
DRAPE LAPAROSCOPIC ABDOMINAL (DRAPES) ×3 IMPLANT
DRAPE PED LAPAROTOMY (DRAPES) ×3 IMPLANT
DRSG TEGADERM 4X4.75 (GAUZE/BANDAGES/DRESSINGS) ×3 IMPLANT
ELECT COATED BLADE 2.86 ST (ELECTRODE) ×3 IMPLANT
ELECT REM PT RETURN 9FT ADLT (ELECTROSURGICAL) ×3
ELECTRODE REM PT RTRN 9FT ADLT (ELECTROSURGICAL) ×2 IMPLANT
EVACUATOR SILICONE 100CC (DRAIN) IMPLANT
GLOVE BIO SURGEON STRL SZ7 (GLOVE) ×4 IMPLANT
GLOVE BIOGEL PI IND STRL 7.5 (GLOVE) ×2 IMPLANT
GLOVE BIOGEL PI INDICATOR 7.5 (GLOVE) ×1
GOWN STRL REUS W/ TWL LRG LVL3 (GOWN DISPOSABLE) ×6 IMPLANT
GOWN STRL REUS W/TWL LRG LVL3 (GOWN DISPOSABLE) ×9
KIT MARKER MARGIN INK (KITS) ×3 IMPLANT
NEEDLE HYPO 25X1 1.5 SAFETY (NEEDLE) ×3 IMPLANT
NS IRRIG 1000ML POUR BTL (IV SOLUTION) ×3 IMPLANT
PACK BASIN DAY SURGERY FS (CUSTOM PROCEDURE TRAY) ×3 IMPLANT
PENCIL BUTTON HOLSTER BLD 10FT (ELECTRODE) ×3 IMPLANT
PIN SAFETY STERILE (MISCELLANEOUS) IMPLANT
SHEET MEDIUM DRAPE 40X70 STRL (DRAPES) IMPLANT
SLEEVE SCD COMPRESS KNEE MED (MISCELLANEOUS) ×3 IMPLANT
SPONGE GAUZE 4X4 12PLY STER LF (GAUZE/BANDAGES/DRESSINGS) ×3 IMPLANT
SPONGE LAP 4X18 X RAY DECT (DISPOSABLE) ×4 IMPLANT
STAPLER VISISTAT 35W (STAPLE) ×3 IMPLANT
STOCKINETTE IMPERVIOUS LG (DRAPES) IMPLANT
STRIP CLOSURE SKIN 1/2X4 (GAUZE/BANDAGES/DRESSINGS) ×3 IMPLANT
SUT ETHILON 2 0 FS 18 (SUTURE) IMPLANT
SUT MNCRL AB 4-0 PS2 18 (SUTURE) ×5 IMPLANT
SUT MON AB 4-0 PC3 18 (SUTURE) ×1 IMPLANT
SUT MON AB 5-0 PS2 18 (SUTURE) IMPLANT
SUT SILK 2 0 SH (SUTURE) IMPLANT
SUT VIC AB 2-0 SH 27 (SUTURE) ×3
SUT VIC AB 2-0 SH 27XBRD (SUTURE) ×2 IMPLANT
SUT VIC AB 3-0 SH 27 (SUTURE) ×6
SUT VIC AB 3-0 SH 27X BRD (SUTURE) ×2 IMPLANT
SUT VIC AB 5-0 PS2 18 (SUTURE) IMPLANT
SUT VICRYL AB 2 0 TIE (SUTURE) IMPLANT
SUT VICRYL AB 2 0 TIES (SUTURE)
SUT VICRYL AB 3 0 TIES (SUTURE) IMPLANT
SYR CONTROL 10ML LL (SYRINGE) ×3 IMPLANT
TOWEL OR 17X24 6PK STRL BLUE (TOWEL DISPOSABLE) ×3 IMPLANT
TOWEL OR NON WOVEN STRL DISP B (DISPOSABLE) ×3 IMPLANT
TUBE CONNECTING 20X1/4 (TUBING) ×1 IMPLANT
YANKAUER SUCT BULB TIP NO VENT (SUCTIONS) IMPLANT

## 2013-04-06 NOTE — Progress Notes (Signed)
Assisted with  nuc med inj Side rails up, monitors on throughout procedure. See vital signs in flow sheet. Tolerated Procedure well.

## 2013-04-06 NOTE — Anesthesia Preprocedure Evaluation (Signed)
Anesthesia Evaluation  Patient identified by MRN, date of birth, ID band Patient awake    Reviewed: Allergy & Precautions, H&P , NPO status , Patient's Chart, lab work & pertinent test results, reviewed documented beta blocker date and time   Airway Mallampati: II TM Distance: >3 FB Neck ROM: full    Dental   Pulmonary neg pulmonary ROS,  breath sounds clear to auscultation        Cardiovascular negative cardio ROS  Rhythm:regular     Neuro/Psych negative neurological ROS  negative psych ROS   GI/Hepatic negative GI ROS, Neg liver ROS,   Endo/Other  negative endocrine ROS  Renal/GU negative Renal ROS  negative genitourinary   Musculoskeletal   Abdominal   Peds  Hematology  (+) anemia ,   Anesthesia Other Findings See surgeon's H&P   Reproductive/Obstetrics negative OB ROS                           Anesthesia Physical Anesthesia Plan  ASA: II  Anesthesia Plan: General   Post-op Pain Management:    Induction: Intravenous  Airway Management Planned: LMA  Additional Equipment:   Intra-op Plan:   Post-operative Plan:   Informed Consent: I have reviewed the patients History and Physical, chart, labs and discussed the procedure including the risks, benefits and alternatives for the proposed anesthesia with the patient or authorized representative who has indicated his/her understanding and acceptance.   Dental Advisory Given  Plan Discussed with: CRNA and Surgeon  Anesthesia Plan Comments:         Anesthesia Quick Evaluation

## 2013-04-06 NOTE — Op Note (Signed)
Preoperative diagnosis: Clinical stage II right breast cancer status post primary systemic chemotherapy Postoperative diagnoses: Same as above Procedure: #1Left subclavian port removal #2 right breast radioactive seed guided lumpectomy #3 injection of blue dye for sentinel node identification #4 right axillary sentinel lymph node biopsy Surgeon: Dr. Serita Grammes Asst.: Dr. Christie Beckers Estimated blood loss: Minimal Complications: None Specimens: #1 right breast tissue marked with paint #2 right axillary sentinel nodes x5 with ex vivo counts ranged from 164-329 Disposition to recovery in stable condition Sponge and needle count was correct at completion  Indications: This is a 43 year old female with a clinical stage II right breast cancer. We decided to proceed with primary systemic therapy to shrink this in order to facilitate breast conservation therapy. She had a good response to her chemotherapy. We discussed proceeding with breast conservation therapy and she would also like her port removed at the same time.  Procedure: She first had a radioactive seed implant in the right breast prior to surgery.  After informed consent was obtained she was then brought over to day surgery. She had technetium administered in a standard periareolar fashion. She was given cefazolin. Sequential compression devices were on her legs. She was placed under general anesthesia with an LMA. I confirmed that the radioactive seed was in the breast with the neoprobe prior to beginning. I had these mammograms available in the operating room. She was then prepped and draped in the standard sterile surgical fashion. A surgical timeout was then performed.  I first removed her port. I infiltrated Marcaine in her prior incision. I then removed the port in its entirety. I removed all of the suture material from the cavity. I then obtained hemostasis. This was closed with 3-0 Vicryl, 4-0 Monocryl, and Dermabond.  I then  injected methylene blue dye combined with saline around the areola. This was then massaged for 2 minutes. I then used the neoprobe to identify the location of the radioactive seeds. This was at about 6:00 just below the nipple areolar complex. I made a periareolar incision. I then used the neoprobe to guide the lumpectomy to excise the seed with an attempt to get a clear margin with the normal tissue. This was then removed in total. This was marked with paint. A mammogram was taken and confirmed removal of the seed in the clip. I confirmed the removal of the seed with the neoprobe. There was no background radioactivity in the cavity. This was also confirmed by radiology. The specimen was then sent to pathology and they were able to retrieve this. I then obtained hemostasis. I placed 2 clips at the deep margin. I placed one clip in each position around the cavity. I then proceeded to close the cavity with 2-0 Vicryl. The dermis was closed with 3-0 Vicryl and the skin with 5-0 Monocryl. Dermabond and Steri-Strips are placed over this incision.  I then identified the location of the sentinel node. I made a 2.5 centimeter incision below the axillary hairline. I then carried this through the axillary fascia. I identified what ended up being 5 sentinel lymph nodes. 3 of these were radioactive with counts listed above and one of these was also blue. 2 others had what felt like palpable nodes and I removed these areas as well. Hemostasis was then obtained. I closed the axillary fascia with 2-0 Vicryl. The remainder of the incision was closed with 3-0 Vicryl, 4 Monocryl, Steri-Strips and a sterile dressing. I infiltrated Marcaine throughout both of these incisions. She tolerated  this well and was extubated. She had a breast binder placed. She was then transferred to recovery room in stable condition.

## 2013-04-06 NOTE — Discharge Instructions (Signed)
[facility] °Office Phone Number [phone] ° °BREAST BIOPSY/ PARTIAL MASTECTOMY: POST OP INSTRUCTIONS ° °Always review your discharge instruction sheet given to you by the facility where your surgery was performed. ° °IF YOU HAVE DISABILITY OR FAMILY LEAVE FORMS, YOU MUST BRING THEM TO THE OFFICE FOR PROCESSING.  DO NOT GIVE THEM TO YOUR DOCTOR. ° °1. A prescription for pain medication may be given to you upon discharge.  Take your pain medication as prescribed, if needed.  If narcotic pain medicine is not needed, then you may take acetaminophen (Tylenol), naprosyn (Alleve) or ibuprofen (Advil) as needed. °2. Take your usually prescribed medications unless otherwise directed °3. If you need a refill on your pain medication, please contact your pharmacy.  They will contact our office to request authorization.  Prescriptions will not be filled after 5pm or on week-ends. °4. You should eat very light the first 24 hours after surgery, such as soup, crackers, pudding, etc.  Resume your normal diet the day after surgery. °5. Most patients will experience some swelling and bruising in the breast.  Ice packs and a good support bra will help.  Wear the breast binder provided or a sports bra for 72 hours day and night.  After that wear a sports bra during the day until you return to the office. Swelling and bruising can take several days to resolve.  °6. It is common to experience some constipation if taking pain medication after surgery.  Increasing fluid intake and taking a stool softener will usually help or prevent this problem from occurring.  A mild laxative (Milk of Magnesia or Miralax) should be taken according to package directions if there are no bowel movements after 48 hours. °7. Unless discharge instructions indicate otherwise, you may remove your bandages 48 hours after surgery and you may shower at that time.  You may have steri-strips (small skin tapes) in place directly over the incision.   These strips should be left on the skin for 7-10 days and will come off on their own.  If your surgeon used skin glue on the incision, you may shower in 24 hours.  The glue will flake off over the next 2-3 weeks.  Any sutures or staples will be removed at the office during your follow-up visit. °8. ACTIVITIES:  You may resume regular daily activities (gradually increasing) beginning the next day.  Wearing a good support bra or sports bra minimizes pain and swelling.  You may have sexual intercourse when it is comfortable. °a. You may drive when you no longer are taking prescription pain medication, you can comfortably wear a seatbelt, and you can safely maneuver your car and apply brakes. °b. RETURN TO WORK:  ______________________________________________________________________________________ °9. You should see your doctor in the office for a follow-up appointment approximately two weeks after your surgery.  Your doctor’s nurse will typically make your follow-up appointment when she calls you with your pathology report.  Expect your pathology report 3-4 business days after your surgery.  You may call to check if you do not hear from us after three days. °10. OTHER INSTRUCTIONS: _______________________________________________________________________________________________ _____________________________________________________________________________________________________________________________________ °_____________________________________________________________________________________________________________________________________ °_____________________________________________________________________________________________________________________________________ ° °WHEN TO CALL DR WAKEFIELD: °1. Fever over 101.0 °2. Nausea and/or vomiting. °3. Extreme swelling or bruising. °4. Continued bleeding from incision. °5. Increased pain, redness, or drainage from the incision. ° °The clinic staff is available to  answer your questions during regular business hours.  Please don’t hesitate to call and ask to speak to one of the nurses for   clinical concerns.  If you have a medical emergency, go to the nearest emergency room or call 911.  A surgeon from [facility] is always on call at the hospital. ° °For further questions, please visit [email or web address] ° ° ° ° °Post Anesthesia Home Care Instructions ° °Activity: °Get plenty of rest for the remainder of the day. A responsible adult should stay with you for 24 hours following the procedure.  °For the next 24 hours, DO NOT: °-Drive a car °-Operate machinery °-Drink alcoholic beverages °-Take any medication unless instructed by your physician °-Make any legal decisions or sign important papers. ° °Meals: °Start with liquid foods such as gelatin or soup. Progress to regular foods as tolerated. Avoid greasy, spicy, heavy foods. If nausea and/or vomiting occur, drink only clear liquids until the nausea and/or vomiting subsides. Call your physician if vomiting continues. ° °Special Instructions/Symptoms: °Your throat may feel dry or sore from the anesthesia or the breathing tube placed in your throat during surgery. If this causes discomfort, gargle with warm salt water. The discomfort should disappear within 24 hours. ° ° °Call your surgeon if you experience:  ° °1.  Fever over 101.0. °2.  Inability to urinate. °3.  Nausea and/or vomiting. °4.  Extreme swelling or bruising at the surgical site. °5.  Continued bleeding from the incision. °6.  Increased pain, redness or drainage from the incision. °7.  Problems related to your pain medication. ° ° ° °

## 2013-04-06 NOTE — Anesthesia Postprocedure Evaluation (Signed)
Anesthesia Post Note  Patient: Mary Dougherty  Procedure(s) Performed: Procedure(s) (LRB): RADIOACTIVE SEED GUIDED LUMPECTOMY AND RIGHT AXILLARY SENTINEL LYMPH NODE BIOPSY  (Right) REMOVAL PORT-A-CATH (N/A)  Anesthesia type: General  Patient location: PACU  Post pain: Pain level controlled  Post assessment: Patient's Cardiovascular Status Stable  Last Vitals:  Filed Vitals:   04/06/13 1030  BP: 108/52  Pulse: 79  Temp:   Resp: 10    Post vital signs: Reviewed and stable  Level of consciousness: alert  Complications: No apparent anesthesia complications

## 2013-04-06 NOTE — H&P (Signed)
HPI  Mary Dougherty is a 43 y.o. female.  HPI  This is a 43 year old female who has undergone primary chemotherapy for right breast cancer that is hormone receptor positive and HER-2/neu negative. This area initially was a 2.7 cm area. There was no lymph node involvement at the beginning. She has done well except for needing some blood transfusions for anemia. Her last MRI showed a decreased size now measuring 1.4 x 0.5 x 1 cm where this previously been 2.7 x 2.3 x 1.6 cm.  There is no evidence of any multifocal, multicentric, or contralateral neoplasm there were no abnormal lymph nodes are identified. She has no changes since our last visit.  Past Medical History   Diagnosis  Date   .  Anemia    .  Thyroid disease    .  Wears glasses    .  Breast cancer  2014     ER+/PR+/Her2-   .  Spherocytosis  1989    Past Surgical History   Procedure  Laterality  Date   .  Wisdom tooth extraction     .  Portacath placement  Left  09/20/2012     Procedure: INSERTION PORT-A-CATH; Surgeon: [name], MD; Location: [facility]; Service: General; Laterality: Left;    Family History   Problem  Relation  Age of Onset   .  Pulmonary embolism  Maternal Uncle  94     died instantly   .  Melanoma  Cousin      paternal cousin dx in her 57s   .  Heart disease  Father    .  Diabetes  Father    Social History  History   Substance Use Topics   .  Smoking status:  Never Smoker   .  Smokeless tobacco:  Never Used   .  Alcohol Use:  No   No Known Allergies  Current Outpatient Prescriptions   Medication  Sig  Dispense  Refill   .  cetirizine (ZYRTEC) 10 MG tablet  Take 10 mg by mouth daily.     .  Cholecalciferol (VITAMIN D3) 1000 UNITS CAPS  Take 1 capsule by mouth daily.     Marland Kitchen  docusate sodium (COLACE) 100 MG capsule  Take 100 mg by mouth 3 (three) times daily as needed.     .  folic acid (FOLVITE) 562 MCG tablet  Take 400 mcg by mouth 2 (two) times daily.     Marland Kitchen   lidocaine-prilocaine (EMLA) cream  Apply topically as needed.  30 g  6   .  Multiple Vitamins-Minerals (AIRBORNE PO)  Take by mouth. Take 1 PO daily with meal.     .  Prenatal Vit-Fe Fumarate-FA (PRENATAL MULTIVITAMIN) TABS tablet  Take 1 tablet by mouth daily at 12 noon.     .  valACYclovir (VALTREX) 500 MG tablet  Take 500 mg by mouth daily.      No current facility-administered medications for this visit.   Review of Systems  Review of Systems  Constitutional: Negative for fever, chills and unexpected weight change.  Respiratory: Negative for cough and wheezing.  Cardiovascular: Negative for chest pain, palpitations and leg swelling.  Gastrointestinal: Negative for nausea, vomiting, abdominal pain, diarrhea, constipation, blood in stool, abdominal distention and anal bleeding.   Physical Exam  Physical Exam  Vitals reviewed.  Constitutional: She appears well-developed and well-nourished.  Neck: Neck supple.  Cardiovascular: Normal rate, regular rhythm and normal heart sounds.  Pulmonary/Chest: Effort normal  and breath sounds normal. She has no wheezes. She has no rales. Right breast exhibits no inverted nipple, no mass, no nipple discharge, no skin change and no tenderness. Left breast exhibits no inverted nipple, no mass, no nipple discharge, no skin change and no tenderness.   Data Reviewed  EXAM:  BILATERAL BREAST MRI WITH AND WITHOUT CONTRAST  LABS: None  TECHNIQUE:  Multiplanar, multisequence MR images of both breasts were obtained  prior to and following the intravenous administration of 25m of  MultiHance.  THREE-DIMENSIONAL MR IMAGE RENDERING ON INDEPENDENT WORKSTATION:  Three-dimensional MR images were rendered by post-processing of the  original MR data on an independent workstation. The  three-dimensional MR images were interpreted, and findings are  reported in the following complete MRI report for this study. Three  dimensional images were evaluated at the  independent DynaCad  workstation  COMPARISON: 09/09/2012 MRI and prior mammograms.  FINDINGS:  Breast composition: b. Scattered fibroglandular tissue.  Background parenchymal enhancement: Minimal  Right breast: A 1.4 x 0.5 x 1 cm irregular enhancing mass (plateau  kinetics) with biopsy clip artifact is identified at the 6 o'clock  position of the right breast (middle third) compatible with  biopsy-proven neoplasm. This mass has decreased in size, enhancement  and volume - previously measuring 2.7 x 2.3 x 1.6 cm.  No other suspicious areas of enhancement are identified within the  right breast.  Left breast: No mass or abnormal enhancement.  Lymph nodes: No abnormal appearing lymph nodes.  Ancillary findings: Cardiomegaly noted. A left chest Port-A-Cath is  present.  IMPRESSION:  Decreasing size/volume and enhancement of known lower right breast  neoplasm, now measuring 1.4 x 0.5 x 1 cm - previously 2.7 x 2.3 x  1.6 cm.  No evidence of multifocal, multicentric or contralateral neoplasm.  No abnormal lymph nodes identified.  Assessment  Clinical stage II right breast cancer status post primary chemotherapy  Plan  Right breast radioactive seed guided lumpectomy, right axillary sentinel lymph node biopsy, port removal

## 2013-04-06 NOTE — Transfer of Care (Signed)
Immediate Anesthesia Transfer of Care Note  Patient: Mary Dougherty  Procedure(s) Performed: Procedure(s): RADIOACTIVE SEED GUIDED LUMPECTOMY AND RIGHT AXILLARY SENTINEL LYMPH NODE BIOPSY  (Right) REMOVAL PORT-A-CATH (N/A)  Patient Location: PACU  Anesthesia Type:General  Level of Consciousness: awake, alert  and oriented  Airway & Oxygen Therapy: Patient Spontanous Breathing and Patient connected to face mask oxygen  Post-op Assessment: Report given to PACU RN and Post -op Vital signs reviewed and stable  Post vital signs: Reviewed and stable  Complications: No apparent anesthesia complications

## 2013-04-06 NOTE — Anesthesia Procedure Notes (Signed)
Procedure Name: LMA Insertion Date/Time: 04/06/2013 7:31 AM Performed by: Melynda Ripple D Pre-anesthesia Checklist: Patient identified, Emergency Drugs available, Suction available and Patient being monitored Patient Re-evaluated:Patient Re-evaluated prior to inductionOxygen Delivery Method: Circle System Utilized Preoxygenation: Pre-oxygenation with 100% oxygen Intubation Type: IV induction Ventilation: Mask ventilation without difficulty LMA: LMA inserted LMA Size: 4.0 Grade View: Grade I Number of attempts: 1 Airway Equipment and Method: bite block Placement Confirmation: positive ETCO2 Tube secured with: Tape Dental Injury: Teeth and Oropharynx as per pre-operative assessment

## 2013-04-07 ENCOUNTER — Other Ambulatory Visit (INDEPENDENT_AMBULATORY_CARE_PROVIDER_SITE_OTHER): Payer: Self-pay | Admitting: General Surgery

## 2013-04-07 ENCOUNTER — Encounter (HOSPITAL_BASED_OUTPATIENT_CLINIC_OR_DEPARTMENT_OTHER): Payer: Self-pay | Admitting: General Surgery

## 2013-04-07 DIAGNOSIS — C50911 Malignant neoplasm of unspecified site of right female breast: Secondary | ICD-10-CM

## 2013-04-12 ENCOUNTER — Telehealth (INDEPENDENT_AMBULATORY_CARE_PROVIDER_SITE_OTHER): Payer: Self-pay

## 2013-04-12 DIAGNOSIS — C50911 Malignant neoplasm of unspecified site of right female breast: Secondary | ICD-10-CM

## 2013-04-12 NOTE — Telephone Encounter (Signed)
LMOM for pt to call. I wanted to let her know that we put in a referral for radiation oncology with Dr Isidore Moos and the appt is 3/20. Dr Isidore Moos didn't have any appt's on 3/19 when she is going to see Dr Humphrey Rolls.

## 2013-04-19 ENCOUNTER — Encounter: Payer: Self-pay | Admitting: Radiation Oncology

## 2013-04-19 ENCOUNTER — Ambulatory Visit (INDEPENDENT_AMBULATORY_CARE_PROVIDER_SITE_OTHER): Payer: BC Managed Care – PPO | Admitting: General Surgery

## 2013-04-19 ENCOUNTER — Encounter (INDEPENDENT_AMBULATORY_CARE_PROVIDER_SITE_OTHER): Payer: Self-pay | Admitting: General Surgery

## 2013-04-19 VITALS — BP 126/70 | HR 76 | Temp 98.5°F | Ht 65.0 in | Wt 205.0 lb

## 2013-04-19 DIAGNOSIS — Z09 Encounter for follow-up examination after completed treatment for conditions other than malignant neoplasm: Secondary | ICD-10-CM

## 2013-04-19 DIAGNOSIS — C50919 Malignant neoplasm of unspecified site of unspecified female breast: Secondary | ICD-10-CM | POA: Insufficient documentation

## 2013-04-19 NOTE — Patient Instructions (Signed)
      ABC CLASS After Breast Cancer Class  After Breast Cancer Class is a specially designed exercise class to assist you in a safe recovery after having breast cancer surgery.  In this class you will learn how to get back to full function whether your drains were just removed or if you had surgery a month ago.  This one-time class is held the 1st and 3rd Monday of every month from 11:00 a.m. until 12:00 noon at the [facility] located at [facility].  This class is FREE and space is limited.  For more information or to register for the next available class, call [phone].  Class Goals   Understand specific stretches to improve the flexibility of your chest and shoulder.   Learn ways to safely strengthen your upper body and improve your posture.   Understand the warning signs of infection and why you may be at risk for an arm infection.   Learn about Lymphedema and prevention.  **You do not attend this class until after surgery.  Drains must be removed to participate.     Serafina Royals, PT, CLT Leone Payor, PT, CLT

## 2013-04-19 NOTE — Progress Notes (Signed)
Subjective:     Patient ID: Mary Dougherty, female   DOB: Aug 24, 1970, 43 y.o.   MRN: [id number]  HPI This is a 43 year old female who presents after undergoing a radioactive seed guided lumpectomy and sentinel node biopsy with port removal after undergoing primary systemic chemotherapy. She has done well her returns today without any complaints. She has no arm swelling. Her arm is moving well also. Her pathology showed a grade 3 invasive ductal carcinoma measured 1.1 cm. Her margins are clear. She ends up having 2 lymph nodes one of which had metastatic cancer present. She is ER and PR positive and HER-2/neu is not amplified. We discussed her at multidisciplinary conference this morning.  Review of Systems     Objective:   Physical Exam Well-healed right axillary and right breast incision, no hematoma, no seroma, no infection    Assessment:     Stage II right breast cancer status post neoadjuvant chemotherapy and, status post lumpectomy and sentinel node biopsy     Plan:     She is doing well. I released her to full activity. She started back to work. She is due to see radiation oncology and medical oncology later this week. We discussed her at our conference this morning I think would be reasonable to forego axillary node dissection and pursue axillary and whole breast radiation in combination with antiestrogen therapy. This was agreed upon at conference also. I gave him information on after breast cancer therapy class. I'll see her back in 6 months or sooner if needed.

## 2013-04-20 ENCOUNTER — Ambulatory Visit (HOSPITAL_BASED_OUTPATIENT_CLINIC_OR_DEPARTMENT_OTHER): Payer: BC Managed Care – PPO | Admitting: Oncology

## 2013-04-20 ENCOUNTER — Telehealth: Payer: Self-pay | Admitting: *Deleted

## 2013-04-20 ENCOUNTER — Other Ambulatory Visit (HOSPITAL_BASED_OUTPATIENT_CLINIC_OR_DEPARTMENT_OTHER): Payer: BC Managed Care – PPO

## 2013-04-20 ENCOUNTER — Encounter: Payer: Self-pay | Admitting: Radiation Oncology

## 2013-04-20 ENCOUNTER — Encounter: Payer: Self-pay | Admitting: Oncology

## 2013-04-20 VITALS — BP 133/61 | HR 87 | Temp 97.6°F | Resp 18 | Ht 65.0 in | Wt 204.5 lb

## 2013-04-20 DIAGNOSIS — C773 Secondary and unspecified malignant neoplasm of axilla and upper limb lymph nodes: Secondary | ICD-10-CM

## 2013-04-20 DIAGNOSIS — D649 Anemia, unspecified: Secondary | ICD-10-CM

## 2013-04-20 DIAGNOSIS — C50511 Malignant neoplasm of lower-outer quadrant of right female breast: Secondary | ICD-10-CM

## 2013-04-20 DIAGNOSIS — C50919 Malignant neoplasm of unspecified site of unspecified female breast: Secondary | ICD-10-CM

## 2013-04-20 DIAGNOSIS — Z170 Estrogen receptor positive status [ER+]: Secondary | ICD-10-CM

## 2013-04-20 LAB — CBC WITH DIFFERENTIAL/PLATELET
BASO%: 0.5 % (ref 0.0–2.0)
BASOS ABS: 0.1 10*3/uL (ref 0.0–0.1)
EOS%: 2.6 % (ref 0.0–7.0)
Eosinophils Absolute: 0.3 10*3/uL (ref 0.0–0.5)
HCT: 29.2 % — ABNORMAL LOW (ref 34.8–46.6)
HEMOGLOBIN: 10 g/dL — AB (ref 11.6–15.9)
LYMPH#: 1.3 10*3/uL (ref 0.9–3.3)
LYMPH%: 10.7 % — ABNORMAL LOW (ref 14.0–49.7)
MCH: 30 pg (ref 25.1–34.0)
MCHC: 34.4 g/dL (ref 31.5–36.0)
MCV: 87.3 fL (ref 79.5–101.0)
MONO#: 0.5 10*3/uL (ref 0.1–0.9)
MONO%: 4.3 % (ref 0.0–14.0)
NEUT#: 9.9 10*3/uL — ABNORMAL HIGH (ref 1.5–6.5)
NEUT%: 81.9 % — ABNORMAL HIGH (ref 38.4–76.8)
Platelets: 175 10*3/uL (ref 145–400)
RBC: 3.34 10*6/uL — ABNORMAL LOW (ref 3.70–5.45)
RDW: 22.7 % — ABNORMAL HIGH (ref 11.2–14.5)
WBC: 12.1 10*3/uL — ABNORMAL HIGH (ref 3.9–10.3)

## 2013-04-20 LAB — COMPREHENSIVE METABOLIC PANEL (CC13)
ALT: 22 U/L (ref 0–55)
AST: 17 U/L (ref 5–34)
Albumin: 3.8 g/dL (ref 3.5–5.0)
Alkaline Phosphatase: 77 U/L (ref 40–150)
Anion Gap: 9 mEq/L (ref 3–11)
BUN: 9.6 mg/dL (ref 7.0–26.0)
CO2: 26 mEq/L (ref 22–29)
Calcium: 9.4 mg/dL (ref 8.4–10.4)
Chloride: 107 mEq/L (ref 98–109)
Creatinine: 0.7 mg/dL (ref 0.6–1.1)
Glucose: 133 mg/dl (ref 70–140)
Potassium: 4.1 mEq/L (ref 3.5–5.1)
Sodium: 142 mEq/L (ref 136–145)
Total Bilirubin: 1.32 mg/dL — ABNORMAL HIGH (ref 0.20–1.20)
Total Protein: 6.5 g/dL (ref 6.4–8.3)

## 2013-04-20 LAB — MORPHOLOGY: PLT EST: ADEQUATE

## 2013-04-20 NOTE — Progress Notes (Signed)
[place]  [phone]) [phone] Fax:([phone]) [phone]  OFFICE PROGRESS NOTE    ID: Mary Dougherty   DOB: 1970/06/27  MR#: [id number]  OMV#:[id number]   PCP: Jenny Reichmann, MD GI: Lucio Edward, M.D. Dr. [name] Dr. Eppie Gibson  DIAGNOSIS:  Mary Dougherty is a 43 y.o. female diagnosed in 09/2012 with invasive ductal carcinoma of the right breast that is estrogen receptor positive, progesterone receptor positive, HER-2/neu negative. Diagnosed 08/2012   PRIOR THERAPY: 1.  Patient went for her initial screening mammogram at the insistence of her sister. She was found to have a mass in the right breast. The ultrasound demonstrated a 1.4 cm area of concern. She had an MRI performed that revealed a 2.7 cm irregular area at the 6 o'clock location. There were no masses in the left breast no abnormal lymph nodes. Patient had a biopsy performed of the right breast that revealed an intermediate to high-grade invasive ductal carcinoma the tumor was estrogen receptor positive 99%, progesterone receptor positive 100%,  HER-2/neu negative with a Ki-67 of 70%.   2. S/P neoadjuvant chemotherapy with dose dense FEC (5-FU/epirubicin/Cytoxan) from 10/07/2012 through 12/16/12.   3. S/P Neoadjuvant  weekly Taxol on 12/30/12 - 03/17/13.    3.  Acute on chronic anemia.   Per review of old records by Dr. Tressie Stalker she has previously been diagnosed with hereditary spherocytosis.  Several of her family members suffer from the same.   CURRENT THERAPY: proceed with RT  INTERVAL HISTORY: Mary Dougherty is a  43 y.o. female who returns for    MEDICAL HISTORY: Past Medical History  Diagnosis Date  . Anemia   . Thyroid disease   . Wears glasses   . Spherocytosis 1989  . Breast cancer 09/2012    ER+/PR+/Her2-, right    ALLERGIES:   No Known Allergies   MEDICATIONS:  Current Outpatient Prescriptions  Medication Sig Dispense Refill  . Cholecalciferol (VITAMIN D3) 1000  UNITS CAPS Take 1 capsule by mouth daily.      Marland Kitchen docusate sodium (COLACE) 100 MG capsule Take 100 mg by mouth 3 (three) times daily as needed.       . folic acid (FOLVITE) 628 MCG tablet Take 400 mcg by mouth 2 (two) times daily.       . Prenatal Vit-Fe Fumarate-FA (PRENATAL MULTIVITAMIN) TABS tablet Take 1 tablet by mouth daily at 12 noon.      Marland Kitchen oxyCODONE-acetaminophen (PERCOCET) 10-325 MG per tablet Take 1 tablet by mouth every 6 (six) hours as needed for pain.  20 tablet  0   No current facility-administered medications for this visit.    SURGICAL HISTORY:  Past Surgical History  Procedure Laterality Date  . Wisdom tooth extraction    . Portacath placement Left 09/20/2012    Procedure: INSERTION PORT-A-CATH;  Surgeon: [name], MD;  Location: [facility];  Service: General;  Laterality: Left;  . Port-a-cath removal N/A 04/06/2013    Procedure: REMOVAL PORT-A-CATH;  Surgeon: [name], MD;  Location: [facility];  Service: General;  Laterality: N/A;  . Breast surgery      left breast lumpectomy/left ax snbx    REVIEW OF SYSTEMS:  A 10 point review of systems was completed and is negative except as stated above.    PHYSICAL EXAMINATION: Blood pressure 133/61, pulse 87, temperature 97.6 F (36.4 C), temperature source Oral, resp. rate 18, height 5' 5" (1.651 m), weight 204 lb 8 oz (92.761  kg). Body mass index is 34.03 kg/(m^2). GENERAL: Patient is a well appearing female in no acute distress HEENT:  Sclerae anicteric.  Oropharynx clear and moist. No ulcerations or evidence of oropharyngeal candidiasis. Neck is supple.  NODES:  No cervical, supraclavicular, or axillary lymphadenopathy palpated.  BREAST EXAM: right breast nodule at 6 is softer,a bout 1cm LUNGS:  Clear to auscultation bilaterally.  No wheezes or rhonchi. HEART:  Regular rate and rhythm. No murmur appreciated. ABDOMEN:  Soft, nontender.  Positive, normoactive bowel sounds. No  organomegaly palpated. MSK:  No focal spinal tenderness to palpation. Full range of motion bilaterally in the upper extremities. EXTREMITIES:  No peripheral edema.   SKIN:  Clear with no obvious rashes or skin changes. No nail dyscrasia. NEURO:  Nonfocal. Well oriented.  Appropriate affect. ECOG FS: 1 - Symptomatic but completely ambulatory  LABORATORY DATA: Lab Results  Component Value Date   WBC 12.1* 04/20/2013   HGB 10.0* 04/20/2013   HCT 29.2* 04/20/2013   MCV 87.3 04/20/2013   PLT 175 04/20/2013      Chemistry      Component Value Date/Time   NA 142 03/28/2013 1500   NA 136 10/26/2012 0918   K 4.0 03/28/2013 1500   K 3.3* 10/26/2012 0918   CL 101 10/26/2012 0918   CO2 26 03/28/2013 1500   CO2 27 10/26/2012 0918   BUN 9.5 03/28/2013 1500   BUN 15 10/26/2012 0918   CREATININE 0.7 03/28/2013 1500   CREATININE 0.60 10/26/2012 0918   GLU 124* 03/10/2013 1438      Component Value Date/Time   CALCIUM 9.4 03/28/2013 1500   CALCIUM 8.4 10/26/2012 0918   ALKPHOS 71 03/28/2013 1500   ALKPHOS 118* 10/26/2012 0918   AST 16 03/28/2013 1500   AST 12 10/26/2012 0918   ALT 24 03/28/2013 1500   ALT 18 10/26/2012 0918   BILITOT 1.05 03/28/2013 1500   BILITOT 0.6 10/26/2012 0918     ADDITIONAL INFORMATION: 1. PROGNOSTIC INDICATORS - ACIS Results: IMMUNOHISTOCHEMICAL AND MORPHOMETRIC ANALYSIS BY THE AUTOMATED CELLULAR IMAGING SYSTEM (ACIS) Estrogen Receptor: 97%, POSITIVE, STRONG STAINING INTENSITY Progesterone Receptor: 95%, POSITIVE, STRONG STAINING INTENSITY REFERENCE RANGE ESTROGEN RECEPTOR NEGATIVE <1% POSITIVE =>1% PROGESTERONE RECEPTOR NEGATIVE <1% POSITIVE =>1% All controls stained appropriately Mary Cutter MD Pathologist, Electronic Signature ( Signed 04/12/2013) 1. CHROMOGENIC IN-SITU HYBRIDIZATION Results: HER-2/NEU BY CISH - NO AMPLIFICATION OF HER-2 DETECTED. RESULT RATIO OF HER2: CEP 17 SIGNALS 1.16 AVERAGE HER2 COPY NUMBER PER CELL 2.50 REFERENCE RANGE 1 of 4 Duplicate  copy FINAL for Mary Dougherty, Mary Dougherty ([id number]) ADDITIONAL INFORMATION:(continued) NEGATIVE HER2/Chr17 Ratio <2.0 and Average HER2 copy number <4.0 EQUIVOCAL HER2/Chr17 Ratio <2.0 and Average HER2 copy number 4.0 and <6.0 POSITIVE HER2/Chr17 Ratio >=2.0 and/or Average HER2 copy number >=6.0 Mary Cutter MD Pathologist, Electronic Signature ( Signed 04/11/2013) FINAL DIAGNOSIS Diagnosis 1. Breast, lumpectomy, Right - INVASIVE DUCTAL CARCINOMA, GRADE III/III, SPANNING 1.1 CM. - DUCTAL CARCINOMA IN SITU, HIGH GRADE. - PERINEURAL INVASION IS IDENTIFIED. - INVASIVE CARCINOMA IS FOCALLY 0.1 CM TO THE INFERIOR MARGIN. - SEE ONCOLOGY TABLET BELOW. 2. Lymph node, sentinel, biopsy, right axillary #1 - BENIGN FIBROADIPOSE TISSUE. - LYMPH NODAL TISSUE IS NOT IDENTIFIED. 3. Lymph node, sentinel, biopsy, right axillary #2 - BENIGN FIBROADIPOSE TISSUE AND SMALL FRAGMENT OF SKELETAL MUSCLE. - LYMPH NODAL TISSUE IS NOT IDENTIFIED. 4. Lymph node, sentinel, biopsy, right axillary #3 - BENIGN FIBROADIPOSE TISSUE. - LYMPH NODAL TISSUE IS NOT IDENTIFIED. 5. Lymph node, sentinel, biopsy, right axillary #4 - METASTATIC  CARCINOMA IN 1 OF 1 LYMPH NODE (1/1). 6. Lymph node, sentinel, biopsy, right axillary #5 - THERE IS NO EVIDENCE OF CARCINOMA IN 1 OF 1 LYMPH NODE (0/1). Microscopic Comment 1. BREAST, INVASIVE TUMOR, WITH LYMPH NODE SAMPLING Specimen, including laterality and lymph node sampling (sentinel, non-sentinel): Right breast with right axillary sentinel nodes. Procedure: Lumpectomy and sentinel node resection. Histologic type: Ductal. Grade: III Tubule formation: 3 Nuclear pleomorphism: 2 Mitotic:3 Tumor size (gross measurement): 1.1 cm Margins: Invasive, distance to closest margin: Focally 0.1 cm to the inferior margin. In-situ, distance to closest margin: Greater than 0.2 cm to all margins. Lymphovascular invasion: Not identified. 2 of 4 Duplicate copy FINAL for Mary Dougherty, Mary Dougherty  ([id number]) Microscopic Comment(continued) Ductal carcinoma in situ: Present. Grade: High grade. Extensive intraductal component: Not identified. Lobular neoplasia: Not identified. Tumor focality: Unifocal. Treatment effect: Minimal. If present, treatment effect in breast tissue, lymph nodes or both: Breast tissue. Extent of tumor: Confined to breast parenchyma. Lymph nodes: Examined: 2 Sentinel 0 Non-sentinel 2 Total Lymph nodes with metastasis: 1 Isolated tumor cells (< 0.2 mm): 0 Micrometastasis: (> 0.2 mm and < 2.0 mm): 0 Macrometastasis: (> 2.0 mm): 1 Extracapsular extension: Not identified. Breast prognostic profile: Will be repeated on the current case and the results reported separately. Non-neoplastic breast: Healing biopsy site and fibrocystic changes with calcifications. TNM: ypT1c, ypN1A (MM:ecj 04/07/2013) Mary Dougherty  RADIOGRAPHIC STUDIES: No results found.   ASSESSMENT/PLAN:   Mary Dougherty is a 43 y.o. female with:   #1 First ever mammogram found an abnormal mass in the right breast.  Ultrasound confirmed the mass to be 1.4 cm at the 6 o'clock position.   MRI revealed the mass to be a little bit bigger at 2.7 cm. Biopsy showed an invasive ductal carcinoma, grade 2, estrogen receptor/progesterone receptor positive, HER-2/neu negative, with a Ki-67 of 70%. S/P neoadjuvant chemotherapy initially consisting of 6 cycles of FEC followed by weekly Taxol x12 weeks. She completed all of her therapy February 2015.  She had an MRI performed to evaluate response to therapy prior to her surgery. She was found to have 1.4 cm residual disease by radiology. She is now status post lumpectomy with sentinel lymph node biopsy. The final pathology does reveal a 1.1 cm invasive ductal carcinoma, grade 3 ER positive PR positive HER-2/neu negative with a proliferation marker Ki-67 elevated. Postoperatively she is doing well.   #2 patient and I discussed her pathology in detail today. She  understands she does have stage II disease since her lymph node is positive. We did discuss that the standard of care would be doing a full lymph node dissection since she did have a positive lymph node. However her case was discussed at the multidisciplinary breast conference on Wednesday, March 18. And we discussed the possibility of not doing full lymph node dissection do to patient experiencing possible increased incidence of lymphedema. She will be meeting with Dr. Eppie Gibson tomorrow to discuss this further. I do think it is appropriate to proceed with radiation to the axilla as well as the breast to prevent morbidity of lymphedema development. Patient does understand that the standard of care would be a full axillary lymph node dissection.  #3 patient and I discussed adjuvant antiestrogen therapy. We discussed tamoxifen versus suppression of ovarian function and starting her on Aromasin based on the soft trial. I discussed the rationale for this. I do think that this individual would be a good candidate to go on Zoladex and Aromasin. We will  need to do hormone levels on her. She has not restarted her periods yet.  #4 history of chronic anemia: Likely due to hereditary spherocytosis. Her counts are improved. We went over these results. She is encouraged to continue taking folic acid as well as prenatal vitamins.  #5 survivorship: We discussed exercise eating healthy and maintaining a good BMI. She is very motivated to begin exercising at least 5-6 days a week. I have encouraged her to continue doing this throughout her radiation therapy.  #6. Followup: I will plan on seeing the patient back after completion of radiation therapy.  All questions answered. Mary Dougherty and her sister were encouraged to contact us in the interim with any questions, concerns, or problems.   I spent 51mnutes counseling the patient face to face.  The total time spent in the appointment was 40 minutes.  Mary Panning  MD Medical/Oncology [phone](beeper) [phone](Office)  04/20/2013, 9:36 AM

## 2013-04-20 NOTE — Progress Notes (Signed)
Location of Breast Cancer:Right Breast, 6 O'Clock Position  Histology per Pathology Report:   04/06/13 Diagnosis 1. Breast, lumpectomy, Right - INVASIVE DUCTAL CARCINOMA, GRADE III/III, SPANNING 1.1 CM. - DUCTAL CARCINOMA IN SITU, HIGH GRADE. - PERINEURAL INVASION IS IDENTIFIED. - INVASIVE CARCINOMA IS FOCALLY 0.1 CM TO THE INFERIOR MARGIN. - SEE ONCOLOGY TABLET BELOW. 2. Lymph node, sentinel, biopsy, right axillary #1 - BENIGN FIBROADIPOSE TISSUE. - LYMPH NODAL TISSUE IS NOT IDENTIFIED. 3. Lymph node, sentinel, biopsy, right axillary #2 - BENIGN FIBROADIPOSE TISSUE AND SMALL FRAGMENT OF SKELETAL MUSCLE. - LYMPH NODAL TISSUE IS NOT IDENTIFIED. 4. Lymph node, sentinel, biopsy, right axillary #3 - BENIGN FIBROADIPOSE TISSUE. - LYMPH NODAL TISSUE IS NOT IDENTIFIED. 5. Lymph node, sentinel, biopsy, right axillary #4 - METASTATIC CARCINOMA IN 1 OF 1 LYMPH NODE (1/1). 6. Lymph node, sentinel, biopsy, right axillary #5 - THERE IS NO EVIDENCE OF CARCINOMA IN 1 OF 1 LYMPH NODE (0/1).  Receptor Status: ER(99%), PR(100%), Her2-neu (No Amplification), Ki-67(70%)  Ms.Devera palpated a right breast mass on her self exam. She underwent mm that showed a right breast abnormality that was followed by u/s showing a 1.4 cm right breast mass. MRI then showed 2.7x2.3.1.6 cm mass with no other abnormalities.  Past/Anticipated interventions by surgeon, if any: Lumpectomy Right Breast  Past/Anticipated interventions by medical oncology, if any: Chemotherapy: Received neoadjuvant chemotherapy with dose dense FEC (5-FU/epirubicin/Cytoxan) from 10/07/2012 through 12/16/12. She started weekly Taxol on 12/30/12. She is due to complete chemotherapy on 03/17/13.  Lymphedema issues, if any: None  Pain issues, if any: Intermittent shooting pains in right breast and tenderness right lateral breast near axilla   Never smoker, No alcohol, No illicit drug use  SAFETY ISSUES:  Prior radiation? NO  Pacemaker/ICD?  NO  Possible current pregnancy?NO      Is the patient on methotrexate? NO   Current Complaints / other details:  menarche at age 65 she and her last menstrual cycle was on 08/26/2012. She is nulliparous

## 2013-04-20 NOTE — Telephone Encounter (Signed)
appts made and printed. i printed the order gv to MD to override 8:30am slot...td

## 2013-04-21 ENCOUNTER — Ambulatory Visit
Admission: RE | Admit: 2013-04-21 | Discharge: 2013-04-21 | Disposition: A | Discharge status: Home / Self Care | Payer: BC Managed Care – PPO | Source: Ambulatory Visit | Attending: Radiation Oncology | Admitting: Radiation Oncology

## 2013-04-21 ENCOUNTER — Encounter: Payer: Self-pay | Admitting: Radiation Oncology

## 2013-04-21 VITALS — BP 102/70 | HR 85 | Temp 97.8°F | Ht 65.0 in | Wt 206.0 lb

## 2013-04-21 DIAGNOSIS — Z170 Estrogen receptor positive status [ER+]: Secondary | ICD-10-CM | POA: Insufficient documentation

## 2013-04-21 DIAGNOSIS — Z9221 Personal history of antineoplastic chemotherapy: Secondary | ICD-10-CM | POA: Insufficient documentation

## 2013-04-21 DIAGNOSIS — C50911 Malignant neoplasm of unspecified site of right female breast: Secondary | ICD-10-CM

## 2013-04-21 DIAGNOSIS — C50919 Malignant neoplasm of unspecified site of unspecified female breast: Secondary | ICD-10-CM | POA: Insufficient documentation

## 2013-04-21 DIAGNOSIS — C50511 Malignant neoplasm of lower-outer quadrant of right female breast: Secondary | ICD-10-CM

## 2013-04-21 DIAGNOSIS — Z923 Personal history of irradiation: Secondary | ICD-10-CM | POA: Insufficient documentation

## 2013-04-21 DIAGNOSIS — C773 Secondary and unspecified malignant neoplasm of axilla and upper limb lymph nodes: Secondary | ICD-10-CM | POA: Insufficient documentation

## 2013-04-21 HISTORY — DX: Anemia, unspecified: D64.9

## 2013-04-21 HISTORY — DX: Malignant neoplasm of unspecified site of right female breast: C50.911

## 2013-04-21 NOTE — Progress Notes (Signed)
Radiation Oncology         [phone] ________________________________  Name: Mary Dougherty MRN: [id number]  Date: 04/21/2013  DOB: 10-15-1970  Follow-Up Visit Note  Outpatient  CC: DAUB, [name], MD  Mary Bookbinder, MD  Diagnosis and Prior Radiotherapy:  Clinical T2N0M0 Right breast ER/PR positive HER-2/neu negative grade 2 invasive ductal carcinoma. ypT1c, ypN1A   Narrative:  The patient returns today for routine follow-up.    She received neoadjuvant chemotherapy with dose dense FEC (5-FU/epirubicin/Cytoxan) from 10/07/2012 through 12/16/12. As well as Neoadjuvant weekly Taxol on 12/30/12 - 03/17/13. She did experience anemia during chemotherapy, of note she has prior history of spherocytosis, and required transfusions. She was able to work during her systemic therapy.   She underwent surgery on 04/06/2013. This revealed a 1.1 cm tumor in the breast and 1 of 2 sentinel lymph nodes were positive. Her margins are close but they are negative. She was her reviewed at our tumor board this week. She is at substantial risk for lymphedema she undergoes a full axillary lymph node dissection.                              ALLERGIES:  has No Known Allergies.  Meds: Current Outpatient Prescriptions  Medication Sig Dispense Refill  . Cholecalciferol (VITAMIN D3) 1000 UNITS CAPS Take 1 capsule by mouth daily.      Marland Kitchen docusate sodium (COLACE) 100 MG capsule Take 100 mg by mouth 3 (three) times daily as needed.       . folic acid (FOLVITE) 387 MCG tablet Take 400 mcg by mouth 2 (two) times daily.       . Prenatal Vit-Fe Fumarate-FA (PRENATAL MULTIVITAMIN) TABS tablet Take 1 tablet by mouth daily at 12 noon.       No current facility-administered medications for this encounter.    Physical Findings: The patient is in no acute distress. Patient is alert and oriented.  height is $RemoveB'5\' 5"'kHArrcmC$  (1.651 m) and weight is 206 lb (93.441 kg). Her temperature is 97.8 F (36.6 C). Her blood pressure is  102/70 and her pulse is 85. Marland Kitchen    General: Alert and oriented, in no acute distress HEENT: Head is normocephalic.  Extraocular movements are intact. Oropharynx is clear. Neck: Neck is supple, no palpable cervical or supraclavicular lymphadenopathy. Heart: Regular in rate and rhythm with no murmurs, rubs, or gallops. Chest: Clear to auscultation bilaterally, with no rhonchi, wheezes, or rales. Abdomen: Soft, nontender, nondistended, with no rigidity or guarding. Extremities: The girth of her right wrist is slightly more than the left wrist, this could be muscular in nature as she is right-handed  Lymphatics: No concerning lymphadenopathy. Psychiatric: Judgment and insight are intact. Affect is appropriate. BREASTS: Lumpectomy and axillary scar in right breast are healing well. No palpable lesions of concern bilaterally. Port-A-Cath in the upper left chest    Lab Findings: Lab Results  Component Value Date   WBC 12.1* 04/20/2013   HGB 10.0* 04/20/2013   HCT 29.2* 04/20/2013   MCV 87.3 04/20/2013   PLT 175 04/20/2013   PATHOLOGY:  Diagnosis 1. Breast, lumpectomy, Right - INVASIVE DUCTAL CARCINOMA, GRADE III/III, SPANNING 1.1 CM. - DUCTAL CARCINOMA IN SITU, HIGH GRADE. - PERINEURAL INVASION IS IDENTIFIED. - INVASIVE CARCINOMA IS FOCALLY 0.1 CM TO THE INFERIOR MARGIN. - SEE ONCOLOGY TABLET BELOW. 2. Lymph node, sentinel, biopsy, right axillary #1 - BENIGN FIBROADIPOSE TISSUE. - LYMPH NODAL TISSUE IS NOT  IDENTIFIED. 3. Lymph node, sentinel, biopsy, right axillary #2 - BENIGN FIBROADIPOSE TISSUE AND SMALL FRAGMENT OF SKELETAL MUSCLE. - LYMPH NODAL TISSUE IS NOT IDENTIFIED. 4. Lymph node, sentinel, biopsy, right axillary #3 - BENIGN FIBROADIPOSE TISSUE. - LYMPH NODAL TISSUE IS NOT IDENTIFIED. 5. Lymph node, sentinel, biopsy, right axillary #4 - METASTATIC CARCINOMA IN 1 OF 1 LYMPH NODE (1/1). 6. Lymph node, sentinel, biopsy, right axillary #5 - THERE IS NO EVIDENCE OF CARCINOMA IN 1 OF 1  LYMPH NODE (0/1). Microscopic Comment 1. BREAST, INVASIVE TUMOR, WITH LYMPH NODE SAMPLING Specimen, including laterality and lymph node sampling (sentinel, non-sentinel): Right breast with right axillary sentinel nodes. Procedure: Lumpectomy and sentinel node resection. Histologic type: Ductal. Grade: III Tubule formation: 3 Nuclear pleomorphism: 2 Mitotic:3 Tumor size (gross measurement): 1.1 cm Margins: Invasive, distance to closest margin: Focally 0.1 cm to the inferior margin. In-situ, distance to closest margin: Greater than 0.2 cm to all margins. Lymphovascular invasion: Not identified. 2 of 4 Duplicate copy FINAL for Mary Dougherty ([id number]) Microscopic Comment(continued) Ductal carcinoma in situ: Present. Grade: High grade. Extensive intraductal component: Not identified. Lobular neoplasia: Not identified. Tumor focality: Unifocal. Treatment effect: Minimal. If present, treatment effect in breast tissue, lymph nodes or both: Breast tissue. Extent of tumor: Confined to breast parenchyma. Lymph nodes: Examined: 2 Sentinel 0 Non-sentinel 2 Total Lymph nodes with metastasis: 1 Isolated tumor cells (< 0.2 mm): 0 Micrometastasis: (> 0.2 mm and < 2.0 mm): 0 Macrometastasis: (> 2.0 mm): 1 Extracapsular extension: Not identified. Breast prognostic profile: Will be repeated on the current case and the results reported separately. Non-neoplastic breast: Healing biopsy site and fibrocystic changes with calcifications. TNM: ypT1c, ypN1A    Radiographic Findings: Nm Sentinel Node Inj-no Rpt (breast)  04/06/2013   CLINICAL DATA: right axillary sentinel node biopsy   Sulfur colloid was injected intradermally by the nuclear medicine  technologist for breast cancer sentinel node localization.    Mm Breast Surgical Specimen  04/07/2013   CLINICAL DATA:  Radioactive seed localization of the right breast yesterday. Right breast lumpectomy today.  EXAM: SPECIMEN RADIOGRAPH OF THE  RIGHT BREAST  COMPARISON:  Previous exam(s).  FINDINGS: Status post excision of the right breast. The radioactive seed and the tissue marker clip are present and are marked for pathology. Calcifications are also present in the sample.  IMPRESSION: Specimen radiograph of the right breast confirming presence of the radioactive seed and the tissue marker clip.   Electronically Signed   By: Hulan Saas M.D.   On: 04/07/2013 16:43   Mm Rt Plc Breast Loc Dev   1st Lesion  Inc Mammo Guide  04/05/2013   CLINICAL DATA:  Patient has been undergoing preoperative chemotherapy for right breast cancer. She presents for radioactive seed localization prior to lumpectomy.  EXAM: MAMMOGRAPHIC GUIDED RADIOACTIVE SEED LOCALIZATION OF THE RIGHT BREAST  COMPARISON:  Previous exam(s)  FINDINGS: Patient presents for radioactive seed localization prior to lumpectomy. I met with the patient and we discussed the procedure of seed localization including benefits and alternatives. We discussed the high likelihood of a successful procedure. We discussed the risks of the procedure including infection, bleeding, tissue injury, and further surgery. We discussed the low dose of radioactivity involved in the procedure. Informed written consent was given.  The usual time-out protocol was performed immediately prior to the procedure.  Using mammographic guidance, sterile technique, 2% lidocaine and an I-125 radioactive seed, the known cancer in the 6 o'clock position of the right breast was localized  using a mediolateral approach. The follow-up mammogram images confirm the seed in the expected location and are marked for Dr. Donne Hazel.  Follow-up survey of the patient confirms presence of radioactive seed.  Order number of I-125 seed:  [id number].  Dose of I-125 seed:  0.250 mCi.  The patient tolerated the procedure well and was [facility]. She was given instructions regarding seed removal.  IMPRESSION: Radioactive seed  localization right breast. No apparent complications.   Electronically Signed   By: Ulyess Blossom M.D.   On: 04/05/2013 12:49    Impression/Plan: This is a lovely 43 year old woman with ypT1C N1 A. right breast cancer. She underwent neoadjuvant chemotherapy as described above. One of 2 lymph nodes were positive at recent surgery.  As discussed at tumor board, she is at significant risk for lymphedema she undergoes an axillary lymph node dissection. I discussed this with the patient. We talked about the fact that there is no data to say that her life expectancy would be improved with axillary lymph node dissection. Although it is not the standard approach, an option would be to defer axillary lymph node dissection and instead undergo radiotherapy to the breast and the axillary and supraclavicular lymph nodes. She is enthusiastic about this plan. We talked about risk and benefits and side effects of this. We spoke about the fact that radiotherapy can cause skin irritation and fatigue and it does carry a risk of lymphedema which I estimate to be in the range of 15-20%. We spoke about rare lung toxicity and cosmetic changes from radiotherapy. She is enthusiastic to proceed. Simulation will take place on March 27. Consent form has been signed and placed in her chart. All questions were answered today and I look forward to participating in her care.   _____________________________________   Eppie Gibson, MD

## 2013-04-21 NOTE — Addendum Note (Signed)
Encounter addended by: Deirdre Evener, RN on: 04/21/2013  4:13 PM<BR>     Documentation filed: Charges VN

## 2013-04-21 NOTE — Addendum Note (Signed)
Encounter addended by: Deirdre Evener, RN on: 04/21/2013  4:14 PM<BR>     Documentation filed: Charges VN

## 2013-04-28 ENCOUNTER — Ambulatory Visit
Admission: RE | Admit: 2013-04-28 | Discharge: 2013-04-28 | Disposition: A | Discharge status: Home / Self Care | Payer: BC Managed Care – PPO | Source: Ambulatory Visit | Attending: Radiation Oncology | Admitting: Radiation Oncology

## 2013-04-28 DIAGNOSIS — C50511 Malignant neoplasm of lower-outer quadrant of right female breast: Secondary | ICD-10-CM

## 2013-04-28 DIAGNOSIS — Z510 Encounter for antineoplastic radiation therapy: Secondary | ICD-10-CM | POA: Insufficient documentation

## 2013-04-28 DIAGNOSIS — C50519 Malignant neoplasm of lower-outer quadrant of unspecified female breast: Secondary | ICD-10-CM | POA: Insufficient documentation

## 2013-04-28 NOTE — Progress Notes (Addendum)
  Radiation Oncology         [phone] ________________________________  Name: Mary Dougherty MRN: [id number]  Date: 04/28/2013  DOB: March 01, 1970  SIMULATION AND TREATMENT PLANNING NOTE  Outpatient  DIAGNOSIS:  Right breast cancer  NARRATIVE:  The patient was brought to the [facility] suite.  Identity was confirmed.  All relevant records and images related to the planned course of therapy were reviewed.  The patient freely provided informed written consent to proceed with treatment after reviewing the details related to the planned course of therapy. The consent form was witnessed and verified by the simulation staff.    Then, the patient was set-up in a stable reproducible  supine position for radiation therapy.  CT images were obtained.  Surface markings were placed.  The CT images were loaded into the planning software.    TREATMENT PLANNING NOTE: Treatment planning then occurred.  The radiation prescription was entered and confirmed.    A total of 10 medically necessary complex treatment devices were fabricated and supervised by me - vaclock + 4 fields with MLCs to block normal tissues (heart lungs) during radiotherapy. I have requested : 3D Simulation  I have requested a DVH of the following structures: heart, lungs, lumpectomy cavity.   5 additional field in fields will allow dose homogeneity.  The patient will receive 50 Gy in 25 fractions to her right breast, axilla, and supraclavicular nodes. She will later be planned for a breast boost of 10 Gy/60fractions  Optical Surface Tracking Plan:  Since intensity modulated radiotherapy (IMRT) and 3D conformal radiation treatment methods are predicated on accurate and precise positioning for treatment, intrafraction motion monitoring is medically necessary to ensure accurate and safe treatment delivery. The ability to quantify intrafraction motion without excessive ionizing radiation dose can only be performed with optical  surface tracking. Accordingly, surface imaging offers the opportunity to obtain 3D measurements of patient position throughout IMRT and 3D treatments without excessive radiation exposure. I am ordering optical surface tracking for this patient's upcoming course of radiotherapy.  ________________________________   Reference:  Ursula Alert, J, et al. Surface imaging-based analysis of intrafraction motion for breast radiotherapy patients.Journal of [place], n. 6, nov. 2014. ISSN [id number].  Available at: <http://www.jacmp.org/index.php/jacmp/article/view/4957>.   -----------------------------------  Eppie Gibson, MD

## 2013-05-01 NOTE — Addendum Note (Signed)
Encounter addended by: Eppie Gibson, MD on: 05/01/2013  7:52 AM<BR>     Documentation filed: Notes Section

## 2013-05-04 ENCOUNTER — Ambulatory Visit
Admission: RE | Admit: 2013-05-04 | Payer: BC Managed Care – PPO | Source: Ambulatory Visit | Admitting: Radiation Oncology

## 2013-05-05 ENCOUNTER — Ambulatory Visit
Admission: RE | Admit: 2013-05-05 | Discharge: 2013-05-05 | Disposition: A | Discharge status: Home / Self Care | Payer: BC Managed Care – PPO | Source: Ambulatory Visit | Attending: Radiation Oncology | Admitting: Radiation Oncology

## 2013-05-05 ENCOUNTER — Ambulatory Visit
Admission: RE | Admit: 2013-05-05 | Payer: BC Managed Care – PPO | Source: Ambulatory Visit | Admitting: Radiation Oncology

## 2013-05-05 DIAGNOSIS — C50511 Malignant neoplasm of lower-outer quadrant of right female breast: Secondary | ICD-10-CM

## 2013-05-05 NOTE — Progress Notes (Signed)
Simulation Verification Note  Right Breast outpatient  The patient was brought to the treatment unit and placed in the planned treatment position. The clinical setup was verified. Then port films were obtained and uploaded to the radiation oncology medical record software.  The treatment beams were carefully compared against the planned radiation fields. The position location and shape of the radiation fields was reviewed. They targeted volume of tissue appears to be appropriately covered by the radiation beams. Organs at risk appear to be excluded as planned.  Based on my personal review, I approved the simulation verification. The patient's treatment will proceed as planned.  -----------------------------------  Eppie Gibson, MD

## 2013-05-08 ENCOUNTER — Other Ambulatory Visit: Payer: BC Managed Care – PPO | Admitting: Radiation Oncology

## 2013-05-08 ENCOUNTER — Ambulatory Visit
Admission: RE | Admit: 2013-05-08 | Discharge: 2013-05-08 | Disposition: A | Discharge status: Home / Self Care | Payer: BC Managed Care – PPO | Source: Ambulatory Visit | Attending: Radiation Oncology | Admitting: Radiation Oncology

## 2013-05-09 ENCOUNTER — Ambulatory Visit
Admission: RE | Admit: 2013-05-09 | Discharge: 2013-05-09 | Disposition: A | Discharge status: Home / Self Care | Payer: BC Managed Care – PPO | Source: Ambulatory Visit | Attending: Radiation Oncology | Admitting: Radiation Oncology

## 2013-05-09 DIAGNOSIS — C50511 Malignant neoplasm of lower-outer quadrant of right female breast: Secondary | ICD-10-CM

## 2013-05-09 MED ORDER — ALRA NON-METALLIC DEODORANT (RAD-ONC)
1.0000 "application " | Freq: Once | TOPICAL | Status: AC
  Administered 2013-05-09: 1 via TOPICAL

## 2013-05-09 MED ORDER — RADIAPLEXRX EX GEL
Freq: Once | CUTANEOUS | Status: AC
  Administered 2013-05-09: 16:00:00 via TOPICAL

## 2013-05-09 NOTE — Progress Notes (Signed)
Education today regarding management of potential side-effects related to radiation therapy to the breast. inclusive of skin care ( redness, tanning, dry or moist peel right breast, neck, and right shoulder blade), pain( breast or sore throat - [place] nodes in treatment field), and fatigue.  Given Radialplex Gel with instructions to apply BID to all of the stated treatment field with the stipulation to make sure the first application is applied 4 hours prior to treatment then at bedtime. Given Alra deodorant with precaution to apply to one small area for 3-4 days to make sure she does not develop a rash.  Teach back method.  Given the Radiation Therapy and You booklet with the appropriate pages marked.

## 2013-05-10 ENCOUNTER — Inpatient Hospital Stay
Admission: RE | Admit: 2013-05-10 | Discharge: 2013-05-10 | Disposition: A | Discharge status: Home / Self Care | Payer: BC Managed Care – PPO | Source: Ambulatory Visit | Attending: Radiation Oncology | Admitting: Radiation Oncology

## 2013-05-10 ENCOUNTER — Encounter: Payer: Self-pay | Admitting: Radiation Oncology

## 2013-05-10 ENCOUNTER — Ambulatory Visit
Admission: RE | Admit: 2013-05-10 | Discharge: 2013-05-10 | Disposition: A | Discharge status: Home / Self Care | Payer: BC Managed Care – PPO | Source: Ambulatory Visit | Attending: Radiation Oncology | Admitting: Radiation Oncology

## 2013-05-10 VITALS — BP 117/58 | HR 88 | Resp 16 | Wt 208.0 lb

## 2013-05-10 DIAGNOSIS — C50511 Malignant neoplasm of lower-outer quadrant of right female breast: Secondary | ICD-10-CM

## 2013-05-10 NOTE — Progress Notes (Signed)
   Weekly Management Note:  outpatient Current Dose:  5.4 Gy  Projected Dose: 60.4 Gy   Narrative:  The patient presents for routine under treatment assessment.  CBCT/MVCT images/Port film x-rays were reviewed.  The chart was checked. Doing well, no new complaints  Physical Findings:  weight is 208 lb (94.348 kg). Her blood pressure is 117/58 and her pulse is 88. Her respiration is 16.  NAD, no skin changes over right breast.  Impression:  The patient is tolerating radiotherapy.  Plan:  Continue radiotherapy as planned.   ________________________________   Eppie Gibson, M.D.

## 2013-05-10 NOTE — Progress Notes (Signed)
Reports using radiaplex bid as directed. Denies skin changes at this time. No lymphedema of either upper extremity noted. Denies fatigue. Denies pain at this time.

## 2013-05-11 ENCOUNTER — Ambulatory Visit
Admission: RE | Admit: 2013-05-11 | Discharge: 2013-05-11 | Disposition: A | Discharge status: Home / Self Care | Payer: BC Managed Care – PPO | Source: Ambulatory Visit | Attending: Radiation Oncology | Admitting: Radiation Oncology

## 2013-05-12 ENCOUNTER — Ambulatory Visit: Payer: BC Managed Care – PPO

## 2013-05-15 ENCOUNTER — Ambulatory Visit
Admission: RE | Admit: 2013-05-15 | Discharge: 2013-05-15 | Disposition: A | Discharge status: Home / Self Care | Payer: BC Managed Care – PPO | Source: Ambulatory Visit | Attending: Radiation Oncology | Admitting: Radiation Oncology

## 2013-05-15 ENCOUNTER — Encounter: Payer: Self-pay | Admitting: Radiation Oncology

## 2013-05-15 VITALS — BP 114/63 | HR 90 | Temp 98.8°F | Resp 20 | Wt 207.1 lb

## 2013-05-15 DIAGNOSIS — C50511 Malignant neoplasm of lower-outer quadrant of right female breast: Secondary | ICD-10-CM

## 2013-05-15 NOTE — Progress Notes (Signed)
   Weekly Management Note:  outpatient Current Dose:  9 Gy  Projected Dose: 60.4 Gy   Narrative:  The patient presents for routine under treatment assessment.  CBCT/MVCT images/Port film x-rays were reviewed.  The chart was checked. No new complaints  Physical Findings:  weight is 207 lb 1.6 oz (93.94 kg). Her oral temperature is 98.8 F (37.1 C). Her blood pressure is 114/63 and her pulse is 90. Her respiration is 20.  No skin changes over breast on right, nor over neck  Impression:  The patient is tolerating radiotherapy.  Plan:  Continue radiotherapy as planned. talked about sun protection (covering up)  ________________________________   Eppie Gibson, M.D.

## 2013-05-15 NOTE — Progress Notes (Signed)
Weekly rad txs rt breast, 5th completed so far, no skin changes noted,, using radiaplex gel bid,  except rash like erythema,a on b/l arms, was playing putt putt golf Saturday 1.5 hours out in the sun without sunscreen, appetite good, fatigue none, occasional pain in breast, resolves quickly 3:38 PM

## 2013-05-16 ENCOUNTER — Ambulatory Visit
Admission: RE | Admit: 2013-05-16 | Discharge: 2013-05-16 | Disposition: A | Discharge status: Home / Self Care | Payer: BC Managed Care – PPO | Source: Ambulatory Visit | Attending: Radiation Oncology | Admitting: Radiation Oncology

## 2013-05-17 ENCOUNTER — Ambulatory Visit
Admission: RE | Admit: 2013-05-17 | Discharge: 2013-05-17 | Disposition: A | Discharge status: Home / Self Care | Payer: BC Managed Care – PPO | Source: Ambulatory Visit | Attending: Radiation Oncology | Admitting: Radiation Oncology

## 2013-05-18 ENCOUNTER — Ambulatory Visit
Admission: RE | Admit: 2013-05-18 | Discharge: 2013-05-18 | Disposition: A | Discharge status: Home / Self Care | Payer: BC Managed Care – PPO | Source: Ambulatory Visit | Attending: Radiation Oncology | Admitting: Radiation Oncology

## 2013-05-19 ENCOUNTER — Ambulatory Visit
Admission: RE | Admit: 2013-05-19 | Discharge: 2013-05-19 | Disposition: A | Discharge status: Home / Self Care | Payer: BC Managed Care – PPO | Source: Ambulatory Visit | Attending: Radiation Oncology | Admitting: Radiation Oncology

## 2013-05-22 ENCOUNTER — Ambulatory Visit
Admission: RE | Admit: 2013-05-22 | Discharge: 2013-05-22 | Disposition: A | Discharge status: Home / Self Care | Payer: BC Managed Care – PPO | Source: Ambulatory Visit | Attending: Radiation Oncology | Admitting: Radiation Oncology

## 2013-05-22 VITALS — BP 115/63 | HR 91 | Temp 98.8°F | Wt 206.6 lb

## 2013-05-22 DIAGNOSIS — C50511 Malignant neoplasm of lower-outer quadrant of right female breast: Secondary | ICD-10-CM

## 2013-05-22 NOTE — Progress Notes (Signed)
Patient for weekly assessment of radiation to right breast and supraclavicular region.denies pain.Reinforced  need to apply radiaplex over clavicle area as she is pink already other wise no skin changes or fatigue.

## 2013-05-22 NOTE — Progress Notes (Signed)
   Weekly Management Note:  outpatient Current Dose:  18 Gy  Projected Dose: 60.4 Gy   Narrative:  The patient presents for routine under treatment assessment.  CBCT/MVCT images/Port film x-rays were reviewed.  The chart was checked. Doing well, skin a bit pink  Physical Findings:  weight is 206 lb 9.6 oz (93.713 kg). Her temperature is 98.8 F (37.1 C). Her blood pressure is 115/63 and her pulse is 91.  faint erythema of right low neck  Impression:  The patient is tolerating radiotherapy.  Plan:  Continue radiotherapy as planned. Discussed where to apply radiaplex.  ________________________________   Eppie Gibson, M.D.

## 2013-05-23 ENCOUNTER — Ambulatory Visit
Admission: RE | Admit: 2013-05-23 | Discharge: 2013-05-23 | Disposition: A | Discharge status: Home / Self Care | Payer: BC Managed Care – PPO | Source: Ambulatory Visit | Attending: Radiation Oncology | Admitting: Radiation Oncology

## 2013-05-24 ENCOUNTER — Ambulatory Visit
Admission: RE | Admit: 2013-05-24 | Discharge: 2013-05-24 | Disposition: A | Discharge status: Home / Self Care | Payer: BC Managed Care – PPO | Source: Ambulatory Visit | Attending: Radiation Oncology | Admitting: Radiation Oncology

## 2013-05-25 ENCOUNTER — Ambulatory Visit
Admission: RE | Admit: 2013-05-25 | Discharge: 2013-05-25 | Disposition: A | Discharge status: Home / Self Care | Payer: BC Managed Care – PPO | Source: Ambulatory Visit | Attending: Radiation Oncology | Admitting: Radiation Oncology

## 2013-05-26 ENCOUNTER — Ambulatory Visit
Admission: RE | Admit: 2013-05-26 | Discharge: 2013-05-26 | Disposition: A | Discharge status: Home / Self Care | Payer: BC Managed Care – PPO | Source: Ambulatory Visit | Attending: Radiation Oncology | Admitting: Radiation Oncology

## 2013-05-29 ENCOUNTER — Ambulatory Visit
Admission: RE | Admit: 2013-05-29 | Discharge: 2013-05-29 | Disposition: A | Discharge status: Home / Self Care | Payer: BC Managed Care – PPO | Source: Ambulatory Visit | Attending: Radiation Oncology | Admitting: Radiation Oncology

## 2013-05-29 ENCOUNTER — Encounter: Payer: Self-pay | Admitting: Radiation Oncology

## 2013-05-29 VITALS — BP 113/65 | HR 88 | Temp 97.7°F | Ht 65.0 in | Wt 207.4 lb

## 2013-05-29 DIAGNOSIS — C50511 Malignant neoplasm of lower-outer quadrant of right female breast: Secondary | ICD-10-CM

## 2013-05-29 NOTE — Progress Notes (Signed)
   Weekly Management Note:  outpatient Current Dose:  27 Gy  Projected Dose: 60.4 Gy   Narrative:  The patient presents for routine under treatment assessment.  CBCT/MVCT images/Port film x-rays were reviewed.  The chart was checked. NAD, no new complaints  Physical Findings:  height is 5\' 5"  (1.651 m) and weight is 207 lb 6.4 oz (94.076 kg). Her temperature is 97.7 F (36.5 C). Her blood pressure is 113/65 and her pulse is 88.  Skin erythematous in UIQ of breast and low neck.  Impression:  The patient is tolerating radiotherapy.  Plan:  Continue radiotherapy as planned ; Continue Radiaplex.  ________________________________   Eppie Gibson, M.D.

## 2013-05-29 NOTE — Progress Notes (Signed)
Mary Dougherty has received 15 fractions to her right breast and right supraclavicular region. Note erythema right upper shoulder blade, right [place] region, and right anterior breast, but her ski remains intact.  She denies any pain nor fatigue.

## 2013-05-30 ENCOUNTER — Ambulatory Visit
Admission: RE | Admit: 2013-05-30 | Discharge: 2013-05-30 | Disposition: A | Discharge status: Home / Self Care | Payer: BC Managed Care – PPO | Source: Ambulatory Visit | Attending: Radiation Oncology | Admitting: Radiation Oncology

## 2013-05-31 ENCOUNTER — Ambulatory Visit
Admission: RE | Admit: 2013-05-31 | Discharge: 2013-05-31 | Disposition: A | Discharge status: Home / Self Care | Payer: BC Managed Care – PPO | Source: Ambulatory Visit | Attending: Radiation Oncology | Admitting: Radiation Oncology

## 2013-06-01 ENCOUNTER — Ambulatory Visit
Admission: RE | Admit: 2013-06-01 | Discharge: 2013-06-01 | Disposition: A | Discharge status: Home / Self Care | Payer: BC Managed Care – PPO | Source: Ambulatory Visit | Attending: Radiation Oncology | Admitting: Radiation Oncology

## 2013-06-02 ENCOUNTER — Encounter: Payer: Self-pay | Admitting: Radiation Oncology

## 2013-06-02 ENCOUNTER — Ambulatory Visit
Admission: RE | Admit: 2013-06-02 | Discharge: 2013-06-02 | Disposition: A | Discharge status: Home / Self Care | Payer: BC Managed Care – PPO | Source: Ambulatory Visit | Attending: Radiation Oncology | Admitting: Radiation Oncology

## 2013-06-02 NOTE — Progress Notes (Signed)
Photon Boost Treatment Planning Note  Diagnosis: Breast Cancer  The patient's CT images from  simulation were reviewed to plan her boost treatment to her right breast  lumpectomy cavity.  The boost to the lumpectomy cavity will be delivered with 2 photon fields using MLCs for custom blocks with 6 MV and 10 MV  photon energy. 10 Gy in 5 fractions prescribed.  -----------------------------------  Eppie Gibson, MD

## 2013-06-05 ENCOUNTER — Ambulatory Visit
Admission: RE | Admit: 2013-06-05 | Discharge: 2013-06-05 | Disposition: A | Discharge status: Home / Self Care | Payer: BC Managed Care – PPO | Source: Ambulatory Visit | Attending: Radiation Oncology | Admitting: Radiation Oncology

## 2013-06-05 ENCOUNTER — Encounter: Payer: Self-pay | Admitting: Radiation Oncology

## 2013-06-05 VITALS — BP 113/57 | HR 84 | Temp 97.7°F | Ht 65.0 in | Wt 208.1 lb

## 2013-06-05 DIAGNOSIS — C50511 Malignant neoplasm of lower-outer quadrant of right female breast: Secondary | ICD-10-CM

## 2013-06-05 NOTE — Progress Notes (Signed)
Mary Dougherty has received 20 fractions to her right breast and right supraclavicular region.  She denies any pain today.  Note redness in the right supraclavicular region, right breast.

## 2013-06-05 NOTE — Progress Notes (Signed)
   Weekly Management Note:  outpatient Current Dose:  36 Gy  Projected Dose: 60.4 Gy   Narrative:  The patient presents for routine under treatment assessment.  CBCT/MVCT images/Port film x-rays were reviewed.  The chart was checked. She is doing well.  No complaints  Physical Findings:  height is 5\' 5"  (1.651 m) and weight is 208 lb 1.6 oz (94.394 kg). Her temperature is 97.7 F (36.5 C). Her blood pressure is 113/57 and her pulse is 84.  NAD, erythema most prominent in low right neck.  Mild erythema over upper R back, R breast  Impression:  The patient is tolerating radiotherapy.  Plan:  Continue radiotherapy as planned.   ________________________________   Eppie Gibson, M.D.

## 2013-06-06 ENCOUNTER — Ambulatory Visit
Admission: RE | Admit: 2013-06-06 | Discharge: 2013-06-06 | Disposition: A | Discharge status: Home / Self Care | Payer: BC Managed Care – PPO | Source: Ambulatory Visit | Attending: Radiation Oncology | Admitting: Radiation Oncology

## 2013-06-07 ENCOUNTER — Ambulatory Visit
Admission: RE | Admit: 2013-06-07 | Discharge: 2013-06-07 | Disposition: A | Discharge status: Home / Self Care | Payer: BC Managed Care – PPO | Source: Ambulatory Visit | Attending: Radiation Oncology | Admitting: Radiation Oncology

## 2013-06-08 ENCOUNTER — Ambulatory Visit
Admission: RE | Admit: 2013-06-08 | Discharge: 2013-06-08 | Disposition: A | Discharge status: Home / Self Care | Payer: BC Managed Care – PPO | Source: Ambulatory Visit | Attending: Radiation Oncology | Admitting: Radiation Oncology

## 2013-06-09 ENCOUNTER — Ambulatory Visit
Admission: RE | Admit: 2013-06-09 | Discharge: 2013-06-09 | Disposition: A | Discharge status: Home / Self Care | Payer: BC Managed Care – PPO | Source: Ambulatory Visit | Attending: Radiation Oncology | Admitting: Radiation Oncology

## 2013-06-12 ENCOUNTER — Ambulatory Visit
Admission: RE | Admit: 2013-06-12 | Discharge: 2013-06-12 | Disposition: A | Discharge status: Home / Self Care | Payer: BC Managed Care – PPO | Source: Ambulatory Visit | Attending: Radiation Oncology | Admitting: Radiation Oncology

## 2013-06-12 ENCOUNTER — Encounter: Payer: Self-pay | Admitting: Radiation Oncology

## 2013-06-12 ENCOUNTER — Ambulatory Visit: Payer: BC Managed Care – PPO | Admitting: Radiation Oncology

## 2013-06-12 VITALS — BP 113/73 | HR 93 | Temp 98.4°F | Resp 20 | Wt 208.0 lb

## 2013-06-12 DIAGNOSIS — C50511 Malignant neoplasm of lower-outer quadrant of right female breast: Secondary | ICD-10-CM

## 2013-06-12 DIAGNOSIS — C50919 Malignant neoplasm of unspecified site of unspecified female breast: Secondary | ICD-10-CM

## 2013-06-12 MED ORDER — RADIAPLEXRX EX GEL
Freq: Once | CUTANEOUS | Status: AC
  Administered 2013-06-12: 16:00:00 via TOPICAL

## 2013-06-12 NOTE — Progress Notes (Signed)
   Weekly Management Note:  outpatient Current Dose:  45 Gy  Projected Dose: 60.4 Gy   Narrative:  The patient presents for routine under treatment assessment.  CBCT/MVCT images/Port film x-rays were reviewed.  The chart was checked. Doing well with increased erythema over her low neck and some increased pain at the inframammary fold which is tolerable  Physical Findings:  weight is 208 lb (94.348 kg). Her oral temperature is 98.4 F (36.9 C). Her blood pressure is 113/73 and her pulse is 93. Her respiration is 20.  skin intact. Erythema more pronounced at right low neck. No peeling at inframammary fold. Mild erythema over the breast and her right upper back  Impression:  The patient is tolerating radiotherapy.  Plan:  Continue radiotherapy as planned. I let the patient know that my partner, Dr. Valere Dross, will be covering for me next week during my vacation. One-month followup card given  ________________________________   Eppie Gibson, M.D.

## 2013-06-12 NOTE — Progress Notes (Signed)
Weekly rad txs 25/28 rt breast/subclavian, erythema on both areas, skin intact, using radiplex gel, gave 2nd tube per pt request, under inframmary fold  Was hurting so patient chnaged bras, better 3:27 PM

## 2013-06-13 ENCOUNTER — Ambulatory Visit
Admission: RE | Admit: 2013-06-13 | Discharge: 2013-06-13 | Disposition: A | Discharge status: Home / Self Care | Payer: BC Managed Care – PPO | Source: Ambulatory Visit | Attending: Radiation Oncology | Admitting: Radiation Oncology

## 2013-06-14 ENCOUNTER — Ambulatory Visit
Admission: RE | Admit: 2013-06-14 | Discharge: 2013-06-14 | Disposition: A | Discharge status: Home / Self Care | Payer: BC Managed Care – PPO | Source: Ambulatory Visit | Attending: Radiation Oncology | Admitting: Radiation Oncology

## 2013-06-15 ENCOUNTER — Ambulatory Visit
Admission: RE | Admit: 2013-06-15 | Discharge: 2013-06-15 | Disposition: A | Discharge status: Home / Self Care | Payer: BC Managed Care – PPO | Source: Ambulatory Visit | Attending: Radiation Oncology | Admitting: Radiation Oncology

## 2013-06-16 ENCOUNTER — Ambulatory Visit
Admission: RE | Admit: 2013-06-16 | Discharge: 2013-06-16 | Disposition: A | Discharge status: Home / Self Care | Payer: BC Managed Care – PPO | Source: Ambulatory Visit | Attending: Radiation Oncology | Admitting: Radiation Oncology

## 2013-06-19 ENCOUNTER — Ambulatory Visit
Admission: RE | Admit: 2013-06-19 | Discharge: 2013-06-19 | Disposition: A | Discharge status: Home / Self Care | Payer: BC Managed Care – PPO | Source: Ambulatory Visit | Attending: Radiation Oncology | Admitting: Radiation Oncology

## 2013-06-19 ENCOUNTER — Encounter: Payer: Self-pay | Admitting: Radiation Oncology

## 2013-06-19 VITALS — BP 116/68 | HR 91 | Resp 16 | Wt 206.9 lb

## 2013-06-19 DIAGNOSIS — C50511 Malignant neoplasm of lower-outer quadrant of right female breast: Secondary | ICD-10-CM

## 2013-06-19 NOTE — Progress Notes (Signed)
Weekly Management Note:  Site: Right breast boost Current Dose:  400  cGy Projected Dose: 1000  cGy  Narrative: The patient is seen today for routine under treatment assessment. CBCT/MVCT images/port films were reviewed. The chart was reviewed.   She has mild discomfort along the right clavicular region and inframammary region where she has radiation dermatitis/desquamation. She uses Radioplex gel.  Physical Examination:  Filed Vitals:   06/19/13 1512  BP: 116/68  Pulse: 91  Resp: 16  .  Weight: 206 lb 14.4 oz (93.849 kg). There is focal marked erythema along the clavicular region with no desquamation and also focal moderate erythema along the inframammary region with dry desquamation.  Impression: Tolerating radiation therapy well, with the exception of radiation dermatitis as expected. She will finish her treatment this Thursday.  Plan: Continue radiation therapy as planned.

## 2013-06-19 NOTE — Progress Notes (Signed)
Vitals WDL. Denies pain or fatigue. Hyperpigmentation without desquamation of right super clav and fold of right breast noted. Reports using radiaplex as directed. Used neosporin on right superclav over the weekend but, reports this burned thus, she won't use it again.

## 2013-06-20 ENCOUNTER — Ambulatory Visit
Admission: RE | Admit: 2013-06-20 | Discharge: 2013-06-20 | Disposition: A | Discharge status: Home / Self Care | Payer: BC Managed Care – PPO | Source: Ambulatory Visit | Attending: Radiation Oncology | Admitting: Radiation Oncology

## 2013-06-21 ENCOUNTER — Ambulatory Visit: Payer: BC Managed Care – PPO

## 2013-06-21 ENCOUNTER — Ambulatory Visit
Admission: RE | Admit: 2013-06-21 | Discharge: 2013-06-21 | Disposition: A | Discharge status: Home / Self Care | Payer: BC Managed Care – PPO | Source: Ambulatory Visit | Attending: Radiation Oncology | Admitting: Radiation Oncology

## 2013-06-22 ENCOUNTER — Ambulatory Visit
Admission: RE | Admit: 2013-06-22 | Discharge: 2013-06-22 | Disposition: A | Discharge status: Home / Self Care | Payer: BC Managed Care – PPO | Source: Ambulatory Visit | Attending: Radiation Oncology | Admitting: Radiation Oncology

## 2013-06-22 ENCOUNTER — Encounter: Payer: Self-pay | Admitting: Radiation Oncology

## 2013-06-22 VITALS — BP 104/67 | HR 87 | Temp 98.1°F | Ht 65.0 in | Wt 206.4 lb

## 2013-06-22 DIAGNOSIS — C50919 Malignant neoplasm of unspecified site of unspecified female breast: Secondary | ICD-10-CM

## 2013-06-22 DIAGNOSIS — C50511 Malignant neoplasm of lower-outer quadrant of right female breast: Secondary | ICD-10-CM

## 2013-06-22 NOTE — Progress Notes (Signed)
Mary Dougherty has completed radiation therapy to her breast 2-3 tenderness right [place] region and lower right axilla with erythema, but skin remains intact.  No fatigue.  FU app. Given.

## 2013-06-23 ENCOUNTER — Encounter: Payer: Self-pay | Admitting: Radiation Oncology

## 2013-06-23 NOTE — Progress Notes (Signed)
  Radiation Oncology         [phone] ________________________________  Name: Mary Dougherty MRN: [id number]  Date: 06/22/2013  DOB: 15-Apr-1970  Weekly Radiation Therapy Management  Current Dose: 60.4 Gy     Planned Dose:  60.4 Gy  Narrative . . . . . . . . The patient presents for the final under treatment assessment.                                                                             The patient has had some continuation of previously noted tenderness right [place] region and lower right axilla, No fatigue                                 Set-up films were reviewed.                                  The chart was checked. Physical Findings. . . Weight essentially stable. right [place] region and lower right axilla with erythema, but skin remains intact Impression . . . . . . . The patient tolerated radiation relatively well. Plan . . . . . . . . . . . . Complete radiation today as scheduled, and follow-up in one month. The patient was encouraged to call or return to the clinic in the interim for any worsening symptoms.  ________________________________  Sheral Apley Tammi Klippel, M.D.

## 2013-06-26 NOTE — Progress Notes (Signed)
  Radiation Oncology         [phone]) [phone] ________________________________  Name: Mary Dougherty MRN: [id number]  Date: 06/22/2013  DOB: 07/01/1970  End of Treatment Note  Diagnosis:  Stage II clinical T2N0M0 Right breast ER/PR positive HER-2/neu negative grade 2 invasive ductal carcinoma. ypT1c, ypN1A   Indication for treatment:  curative      Radiation treatment dates:   05/08/2013-06/22/2013  Site/dose:   Site/dose:    1) Right breast / 50.4 Gy in 28 fractions 2) Right Supraclavicular fossa/ 50.4 Gy in 28 fractions 3) Right Posterior Axillary boost / 11.9 Gy in 28 fractions 4) Right breast boost / 10 Gy in 5 fractions  Beams/energy:   ] 1) Opposed tangents / 10 and 15 MV photons 2) Left anterior oblique / 10 MV photons 3) PA / 6MV photons 4) 2 field / 6 and 10 MV photons      Narrative: The patient tolerated radiation treatment relatively well with radiation dermatitis as expected.   Plan: The patient has completed radiation treatment. The patient will return to radiation oncology clinic for routine followup in one month. I advised them to call or return sooner if they have any questions or concerns related to their recovery or treatment.  -----------------------------------  Eppie Gibson, MD

## 2013-06-28 NOTE — Addendum Note (Signed)
Encounter addended by: Eppie Gibson, MD on: 06/28/2013  8:01 PM<BR>     Documentation filed: Notes Section

## 2013-07-14 ENCOUNTER — Telehealth: Payer: Self-pay | Admitting: Oncology

## 2013-07-14 NOTE — Telephone Encounter (Signed)
, °

## 2013-07-20 ENCOUNTER — Other Ambulatory Visit: Payer: BC Managed Care – PPO

## 2013-07-20 ENCOUNTER — Ambulatory Visit: Payer: BC Managed Care – PPO | Admitting: Oncology

## 2013-07-25 ENCOUNTER — Ambulatory Visit (HOSPITAL_BASED_OUTPATIENT_CLINIC_OR_DEPARTMENT_OTHER): Payer: BC Managed Care – PPO | Admitting: Hematology and Oncology

## 2013-07-25 ENCOUNTER — Other Ambulatory Visit (HOSPITAL_BASED_OUTPATIENT_CLINIC_OR_DEPARTMENT_OTHER): Payer: BC Managed Care – PPO

## 2013-07-25 ENCOUNTER — Ambulatory Visit: Payer: BC Managed Care – PPO

## 2013-07-25 DIAGNOSIS — C50919 Malignant neoplasm of unspecified site of unspecified female breast: Secondary | ICD-10-CM

## 2013-07-25 DIAGNOSIS — D649 Anemia, unspecified: Secondary | ICD-10-CM

## 2013-07-25 DIAGNOSIS — Z79811 Long term (current) use of aromatase inhibitors: Secondary | ICD-10-CM

## 2013-07-25 DIAGNOSIS — Z170 Estrogen receptor positive status [ER+]: Secondary | ICD-10-CM

## 2013-07-25 DIAGNOSIS — Z79818 Long term (current) use of other agents affecting estrogen receptors and estrogen levels: Secondary | ICD-10-CM

## 2013-07-25 DIAGNOSIS — C773 Secondary and unspecified malignant neoplasm of axilla and upper limb lymph nodes: Secondary | ICD-10-CM

## 2013-07-25 DIAGNOSIS — C50511 Malignant neoplasm of lower-outer quadrant of right female breast: Secondary | ICD-10-CM

## 2013-07-25 DIAGNOSIS — Z5111 Encounter for antineoplastic chemotherapy: Secondary | ICD-10-CM

## 2013-07-25 DIAGNOSIS — C50911 Malignant neoplasm of unspecified site of right female breast: Secondary | ICD-10-CM

## 2013-07-25 HISTORY — DX: Long term (current) use of aromatase inhibitors: Z79.811

## 2013-07-25 HISTORY — DX: Long term (current) use of other agents affecting estrogen receptors and estrogen levels: Z79.818

## 2013-07-25 LAB — CBC WITH DIFFERENTIAL/PLATELET
BASO%: 0.4 % (ref 0.0–2.0)
Basophils Absolute: 0 10*3/uL (ref 0.0–0.1)
EOS%: 2.4 % (ref 0.0–7.0)
Eosinophils Absolute: 0.2 10*3/uL (ref 0.0–0.5)
HCT: 29.8 % — ABNORMAL LOW (ref 34.8–46.6)
HGB: 10.2 g/dL — ABNORMAL LOW (ref 11.6–15.9)
LYMPH#: 0.8 10*3/uL — AB (ref 0.9–3.3)
LYMPH%: 8.5 % — ABNORMAL LOW (ref 14.0–49.7)
MCH: 28.7 pg (ref 25.1–34.0)
MCHC: 34.2 g/dL (ref 31.5–36.0)
MCV: 83.7 fL (ref 79.5–101.0)
MONO#: 0.4 10*3/uL (ref 0.1–0.9)
MONO%: 3.8 % (ref 0.0–14.0)
NEUT#: 8.4 10*3/uL — ABNORMAL HIGH (ref 1.5–6.5)
NEUT%: 84.9 % — ABNORMAL HIGH (ref 38.4–76.8)
Platelets: 199 10*3/uL (ref 145–400)
RBC: 3.56 10*6/uL — AB (ref 3.70–5.45)
RDW: 23.6 % — AB (ref 11.2–14.5)
WBC: 9.9 10*3/uL (ref 3.9–10.3)

## 2013-07-25 LAB — COMPREHENSIVE METABOLIC PANEL (CC13)
ALBUMIN: 3.8 g/dL (ref 3.5–5.0)
ALT: 21 U/L (ref 0–55)
AST: 17 U/L (ref 5–34)
Alkaline Phosphatase: 92 U/L (ref 40–150)
Anion Gap: 8 mEq/L (ref 3–11)
BUN: 10.2 mg/dL (ref 7.0–26.0)
CALCIUM: 9.1 mg/dL (ref 8.4–10.4)
CHLORIDE: 107 meq/L (ref 98–109)
CO2: 25 mEq/L (ref 22–29)
Creatinine: 0.8 mg/dL (ref 0.6–1.1)
Glucose: 180 mg/dl — ABNORMAL HIGH (ref 70–140)
POTASSIUM: 4.3 meq/L (ref 3.5–5.1)
SODIUM: 140 meq/L (ref 136–145)
Total Bilirubin: 1.18 mg/dL (ref 0.20–1.20)
Total Protein: 6.8 g/dL (ref 6.4–8.3)

## 2013-07-25 MED ORDER — GOSERELIN ACETATE 3.6 MG ~~LOC~~ IMPL
3.6000 mg | DRUG_IMPLANT | Freq: Once | SUBCUTANEOUS | Status: AC
  Administered 2013-07-25: 3.6 mg via SUBCUTANEOUS
  Filled 2013-07-25: qty 3.6

## 2013-07-25 MED ORDER — EXEMESTANE 25 MG PO TABS: 25.0000 mg | ORAL_TABLET | Freq: Every day | ORAL | Status: DC

## 2013-07-25 NOTE — Patient Instructions (Signed)
Goserelin injection What is this medicine? GOSERELIN (GOE se rel in) is similar to a hormone found in the body. It lowers the amount of sex hormones that the body makes. Men will have lower testosterone levels and women will have lower estrogen levels while taking this medicine. In men, this medicine is used to treat prostate cancer; the injection is either given once per month or once every 12 weeks. A once per month injection (only) is used to treat women with endometriosis, dysfunctional uterine bleeding, or advanced breast cancer. This medicine may be used for other purposes; ask your health care provider or pharmacist if you have questions. COMMON BRAND NAME(S): Zoladex What should I tell my health care provider before I take this medicine? They need to know if you have any of these conditions (some only apply to women): -diabetes -heart disease or previous heart attack -high blood pressure -high cholesterol -kidney disease -osteoporosis or low bone density -problems passing urine -spinal cord injury -stroke -tobacco smoker -an unusual or allergic reaction to goserelin, hormone therapy, other medicines, foods, dyes, or preservatives -pregnant or trying to get pregnant -breast-feeding How should I use this medicine? This medicine is for injection under the skin. It is given by a health care professional in a hospital or clinic setting. Men receive this injection once every 4 weeks or once every 12 weeks. Women will only receive the once every 4 weeks injection. Talk to your pediatrician regarding the use of this medicine in children. Special care may be needed. Overdosage: If you think you have taken too much of this medicine contact a poison control center or emergency room at once. NOTE: This medicine is only for you. Do not share this medicine with others. What if I miss a dose? It is important not to miss your dose. Call your doctor or health care professional if you are unable to  keep an appointment. What may interact with this medicine? -female hormones like estrogen -herbal or dietary supplements like black cohosh, chasteberry, or DHEA -female hormones like testosterone -prasterone This list may not describe all possible interactions. Give your health care provider a list of all the medicines, herbs, non-prescription drugs, or dietary supplements you use. Also tell them if you smoke, drink alcohol, or use illegal drugs. Some items may interact with your medicine. What should I watch for while using this medicine? Visit your doctor or health care professional for regular checks on your progress. Your symptoms may appear to get worse during the first weeks of this therapy. Tell your doctor or healthcare professional if your symptoms do not start to get better or if they get worse after this time. Your bones may get weaker if you take this medicine for a long time. If you smoke or frequently drink alcohol you may increase your risk of bone loss. A family history of osteoporosis, chronic use of drugs for seizures (convulsions), or corticosteroids can also increase your risk of bone loss. Talk to your doctor about how to keep your bones strong. This medicine should stop regular monthly menstration in women. Tell your doctor if you continue to menstrate. Women should not become pregnant while taking this medicine or for 12 weeks after stopping this medicine. Women should inform their doctor if they wish to become pregnant or think they might be pregnant. There is a potential for serious side effects to an unborn child. Talk to your health care professional or pharmacist for more information. Do not breast-feed an infant while taking   this medicine. Men should inform their doctors if they wish to father a child. This medicine may lower sperm counts. Talk to your health care professional or pharmacist for more information. What side effects may I notice from receiving this  medicine? Side effects that you should report to your doctor or health care professional as soon as possible: -allergic reactions like skin rash, itching or hives, swelling of the face, lips, or tongue -bone pain -breathing problems -changes in vision -chest pain -feeling faint or lightheaded, falls -fever, chills -pain, swelling, warmth in the leg -pain, tingling, numbness in the hands or feet -swelling of the ankles, feet, hands -trouble passing urine or change in the amount of urine -unusually high or low blood pressure -unusually weak or tired Side effects that usually do not require medical attention (report to your doctor or health care professional if they continue or are bothersome): -change in sex drive or performance -changes in breast size in both males and females -changes in emotions or moods -headache -hot flashes -irritation at site where injected -loss of appetite -skin problems like acne, dry skin -vaginal dryness This list may not describe all possible side effects. Call your doctor for medical advice about side effects. You may report side effects to FDA at 1-800-FDA-1088. Where should I keep my medicine? This drug is given in a hospital or clinic and will not be stored at home. NOTE: This sheet is a summary. It may not cover all possible information. If you have questions about this medicine, talk to your doctor, pharmacist, or health care provider.  2015, [employer]. (2008-06-05 13:28:29)

## 2013-07-26 LAB — LUTEINIZING HORMONE: LH: 145.1 m[IU]/mL — AB

## 2013-07-26 LAB — FOLLICLE STIMULATING HORMONE: FSH: 91.2 m[IU]/mL

## 2013-07-27 ENCOUNTER — Telehealth: Payer: Self-pay | Admitting: Hematology and Oncology

## 2013-07-27 NOTE — Telephone Encounter (Signed)
, °

## 2013-08-04 NOTE — Progress Notes (Signed)
[facility]  [phone] [phone]  OFFICE PROGRESS NOTE    ID: Drexel Iha   DOB: 1970/03/10  MR#: [id number]  PPI#:[id number]   PCP: Jenny Reichmann, MD GI: Lucio Edward, M.D. Dr. [name] Dr. Eppie Gibson  Chief complaint: F/u visit after radiation therapy  DIAGNOSIS:  Mary Dougherty is a 43 y.o. female with invasive ductal carcinoma of the right breast -estrogen receptor positive, progesterone receptor positive, HER-2/neu negative-Diagnosed 09/2012   PRIOR THERAPY: From original intake note:  1.  Patient went for her initial screening mammogram at the insistence of her sister. She was found to have a mass in the right breast. The ultrasound demonstrated a 1.4 cm area of concern. She had an MRI performed that revealed a 2.7 cm irregular area at the 6 o'clock location. There were no masses in the left breast no abnormal lymph nodes. Patient had a biopsy performed of the right breast that revealed an intermediate to high-grade invasive ductal carcinoma the tumor was estrogen receptor positive 99%, progesterone receptor positive 100%,  HER-2/neu negative with a Ki-67 of 70%.   2. S/P neoadjuvant chemotherapy with dose dense FEC (5-FU/epirubicin/Cytoxan) from 10/07/2012 through 12/16/12.  Followed by weekly Taxol on 12/30/12 - 03/17/13.    3.  Acute on chronic anemia.   Per review of old records by Dr. Tressie Stalker she has previously been diagnosed with hereditary spherocytosis.  Several of her family members suffer from the same.    INTERVAL HISTORY: Mary Dougherty is a  43 y.o. female who returns for  Follow up visit. She completed radiation therapy from 04/06/ 2015 to 06/22/2013. She tolerated radiation well. At present she denies any shortness of breath, constipation, weight loss or decrease in appetite, dizziness, blurred vision or head aches  MEDICAL HISTORY: Past Medical History  Diagnosis Date  . Anemia   . Thyroid  disease   . Wears glasses   . Spherocytosis 1989  . Invasive ductal carcinoma of right breast 09/2012    ER(99%), PR(100%), Ki-67(70%), 1/5 nodes positive  . Chronic anemia 03/03/13    ALLERGIES:   No Known Allergies   MEDICATIONS:  Current Outpatient Prescriptions  Medication Sig Dispense Refill  . Cholecalciferol (VITAMIN D3) 1000 UNITS CAPS Take 1 capsule by mouth daily.      . folic acid (FOLVITE) 063 MCG tablet Take 400 mcg by mouth daily.       . hyaluronate sodium (RADIAPLEXRX) GEL Apply 1 application topically 2 (two) times daily.      . non-metallic deodorant Jethro Poling) MISC Apply 1 application topically daily.      . Prenatal Vit-Fe Fumarate-FA (PRENATAL MULTIVITAMIN) TABS tablet Take 1 tablet by mouth daily at 12 noon.      Marland Kitchen exemestane (AROMASIN) 25 MG tablet Take 1 tablet (25 mg total) by mouth daily after breakfast.  90 tablet  2   No current facility-administered medications for this visit.    SURGICAL HISTORY:  Past Surgical History  Procedure Laterality Date  . Wisdom tooth extraction    . Portacath placement Left 09/20/2012    Procedure: INSERTION PORT-A-CATH;  Surgeon: [name], MD;  Location: [facility];  Service: General;  Laterality: Left;  . Port-a-cath removal N/A 04/06/2013    Procedure: REMOVAL PORT-A-CATH;  Surgeon: [name], MD;  Location: [facility];  Service: General;  Laterality: N/A;  . Breast surgery      left breast lumpectomy/left ax snbx  REVIEW OF SYSTEMS:  A 10 point review of systems was completed and is negative except as stated above.    PHYSICAL EXAMINATION: Pulse 70/mt, Rr-14/mt, BP-126/50mmhg, Temp-97.59F GENERAL: Patient is a well appearing female in no acute distress HEENT:  Sclerae anicteric.  Oropharynx clear and moist. No ulcerations or evidence of oropharyngeal candidiasis. Neck is supple.  NODES:  No cervical, supraclavicular, or axillary lymphadenopathy palpated.  BREAST EXAM: No  massess appreciated, no bilateral axillary lymphadenopathy noted LUNGS:  Clear to auscultation bilaterally.  No wheezes or rhonchi. HEART:  Regular rate and rhythm. No murmur appreciated. ABDOMEN:  Soft, nontender.  Positive, normoactive bowel sounds. No organomegaly palpated. MSK:  No focal spinal tenderness to palpation. Full range of motion bilaterally in the upper extremities. EXTREMITIES:  No peripheral edema.   SKIN:  Clear with no obvious rashes or skin changes. No nail dyscrasia. NEURO:  Nonfocal. Well oriented.  Appropriate affect. ECOG FS: 1 - Symptomatic but completely ambulatory  LABORATORY DATA: Lab Results  Component Value Date   WBC 9.9 07/25/2013   HGB 10.2* 07/25/2013   HCT 29.8* 07/25/2013   MCV 83.7 07/25/2013   PLT 199 07/25/2013      Chemistry      Component Value Date/Time   NA 140 07/25/2013 0905   NA 136 10/26/2012 0918   K 4.3 07/25/2013 0905   K 3.3* 10/26/2012 0918   CL 101 10/26/2012 0918   CO2 25 07/25/2013 0905   CO2 27 10/26/2012 0918   BUN 10.2 07/25/2013 0905   BUN 15 10/26/2012 0918   CREATININE 0.8 07/25/2013 0905   CREATININE 0.60 10/26/2012 0918   GLU 124* 03/10/2013 1438      Component Value Date/Time   CALCIUM 9.1 07/25/2013 0905   CALCIUM 8.4 10/26/2012 0918   ALKPHOS 92 07/25/2013 0905   ALKPHOS 118* 10/26/2012 0918   AST 17 07/25/2013 0905   AST 12 10/26/2012 0918   ALT 21 07/25/2013 0905   ALT 18 10/26/2012 0918   BILITOT 1.18 07/25/2013 0905   BILITOT 0.6 10/26/2012 0918     ADDITIONAL INFORMATION: 1. PROGNOSTIC INDICATORS - ACIS Results: IMMUNOHISTOCHEMICAL AND MORPHOMETRIC ANALYSIS BY THE AUTOMATED CELLULAR IMAGING SYSTEM (ACIS) Estrogen Receptor: 97%, POSITIVE, STRONG STAINING INTENSITY Progesterone Receptor: 95%, POSITIVE, STRONG STAINING INTENSITY REFERENCE RANGE ESTROGEN RECEPTOR NEGATIVE <1% POSITIVE =>1% PROGESTERONE RECEPTOR NEGATIVE <1% POSITIVE =>1% All controls stained appropriately Enid Cutter MD Pathologist, Electronic  Signature ( Signed 04/12/2013) 1. CHROMOGENIC IN-SITU HYBRIDIZATION Results: HER-2/NEU BY CISH - NO AMPLIFICATION OF HER-2 DETECTED. RESULT RATIO OF HER2: CEP 17 SIGNALS 1.16 AVERAGE HER2 COPY NUMBER PER CELL 2.50 REFERENCE RANGE 1 of 4 Duplicate copy FINAL for RETTA, PITCHER ([id number]) ADDITIONAL INFORMATION:(continued) NEGATIVE HER2/Chr17 Ratio <2.0 and Average HER2 copy number <4.0 EQUIVOCAL HER2/Chr17 Ratio <2.0 and Average HER2 copy number 4.0 and <6.0 POSITIVE HER2/Chr17 Ratio >=2.0 and/or Average HER2 copy number >=6.0 Enid Cutter MD Pathologist, Electronic Signature ( Signed 04/11/2013) FINAL DIAGNOSIS Diagnosis 1. Breast, lumpectomy, Right - INVASIVE DUCTAL CARCINOMA, GRADE III/III, SPANNING 1.1 CM. - DUCTAL CARCINOMA IN SITU, HIGH GRADE. - PERINEURAL INVASION IS IDENTIFIED. - INVASIVE CARCINOMA IS FOCALLY 0.1 CM TO THE INFERIOR MARGIN. - SEE ONCOLOGY TABLET BELOW. 2. Lymph node, sentinel, biopsy, right axillary #1 - BENIGN FIBROADIPOSE TISSUE. - LYMPH NODAL TISSUE IS NOT IDENTIFIED. 3. Lymph node, sentinel, biopsy, right axillary #2 - BENIGN FIBROADIPOSE TISSUE AND SMALL FRAGMENT OF SKELETAL MUSCLE. - LYMPH NODAL TISSUE IS NOT IDENTIFIED. 4. Lymph node, sentinel, biopsy, right axillary #3 -  BENIGN FIBROADIPOSE TISSUE. - LYMPH NODAL TISSUE IS NOT IDENTIFIED. 5. Lymph node, sentinel, biopsy, right axillary #4 - METASTATIC CARCINOMA IN 1 OF 1 LYMPH NODE (1/1). 6. Lymph node, sentinel, biopsy, right axillary #5 - THERE IS NO EVIDENCE OF CARCINOMA IN 1 OF 1 LYMPH NODE (0/1). Microscopic Comment 1. BREAST, INVASIVE TUMOR, WITH LYMPH NODE SAMPLING Specimen, including laterality and lymph node sampling (sentinel, non-sentinel): Right breast with right axillary sentinel nodes. Procedure: Lumpectomy and sentinel node resection. Histologic type: Ductal. Grade: III Tubule formation: 3 Nuclear pleomorphism: 2 Mitotic:3 Tumor size (gross measurement): 1.1  cm Margins: Invasive, distance to closest margin: Focally 0.1 cm to the inferior margin. In-situ, distance to closest margin: Greater than 0.2 cm to all margins. Lymphovascular invasion: Not identified. 2 of 4 Duplicate copy FINAL for JAZLYNN, NEMETZ ([id number]) Microscopic Comment(continued) Ductal carcinoma in situ: Present. Grade: High grade. Extensive intraductal component: Not identified. Lobular neoplasia: Not identified. Tumor focality: Unifocal. Treatment effect: Minimal. If present, treatment effect in breast tissue, lymph nodes or both: Breast tissue. Extent of tumor: Confined to breast parenchyma. Lymph nodes: Examined: 2 Sentinel 0 Non-sentinel 2 Total Lymph nodes with metastasis: 1 Isolated tumor cells (< 0.2 mm): 0 Micrometastasis: (> 0.2 mm and < 2.0 mm): 0 Macrometastasis: (> 2.0 mm): 1 Extracapsular extension: Not identified. Breast prognostic profile: Will be repeated on the current case and the results reported separately. Non-neoplastic breast: Healing biopsy site and fibrocystic changes with calcifications. TNM: ypT1c, ypN1A (MM:ecj 04/07/2013) JOSHUA  RADIOGRAPHIC STUDIES: No results found.   ASSESSMENT/PLAN:   Mary Dougherty is a 43 y.o. female with:   #1  Right breast invasive ductal carcinoma-Stage II , ER/PR positive HER-2/neu negative grade 2, s/p neoadjuvant chemo followed by surgery  #2 completed Adjuvant Radiation therapy from 05/08/2013 to 06/22/2013  #3 During the last visit adjuvant antiestrogen therapy was discussed tamoxifen versus suppression of ovarian function and starting her on Aromasin based on the soft trial. I have once again reiterated the benefits and side effects of aromasin and Zoladex inj. We will initiate zoladex inj today and will start her on Aromasin 25 mg po once daily.   #4 History of chronic anemia likely due to hereditary spherocytosis. Her hemoglobin and hematocrit is stable.She is on  folic acid as well as  prenatal vitamins.  #5. We will arrange for baseline DEXA scan   Next F/u visit in one month for zoladex inj, cbc and cmp   All questions were answered. She knows  to contact us in the interim with any questions, concerns  I spent 20 minutes counseling the patient face to face.  The total time spent in the appointment was 30 minutes.  Wilmon Arms, MD Medical/Oncology [facility] [phone] (Office)  08/04/2013, 3:27 PM

## 2013-08-07 ENCOUNTER — Encounter: Payer: Self-pay | Admitting: Radiation Oncology

## 2013-08-09 ENCOUNTER — Encounter: Payer: Self-pay | Admitting: Radiation Oncology

## 2013-08-09 ENCOUNTER — Ambulatory Visit
Admission: RE | Admit: 2013-08-09 | Discharge: 2013-08-09 | Disposition: A | Discharge status: Home / Self Care | Payer: BC Managed Care – PPO | Source: Ambulatory Visit | Attending: Radiation Oncology | Admitting: Radiation Oncology

## 2013-08-09 VITALS — BP 124/61 | HR 88 | Temp 97.7°F | Ht 65.0 in | Wt 205.8 lb

## 2013-08-09 DIAGNOSIS — C50511 Malignant neoplasm of lower-outer quadrant of right female breast: Secondary | ICD-10-CM

## 2013-08-09 HISTORY — DX: Personal history of irradiation: Z92.3

## 2013-08-09 HISTORY — DX: Long term (current) use of aromatase inhibitors: Z79.811

## 2013-08-09 HISTORY — DX: Personal history of antineoplastic chemotherapy: Z92.21

## 2013-08-09 HISTORY — DX: Long term (current) use of other agents affecting estrogen receptors and estrogen levels: Z79.818

## 2013-08-09 NOTE — Progress Notes (Signed)
Ms. Bressi here today for fu s/p radiation to her right breast which completed on 06/22/13. She denies any pain nor redness of her breaast and is working presently.

## 2013-08-09 NOTE — Progress Notes (Signed)
  Radiation Oncology         [phone] ________________________________  Name: Mary Dougherty MRN: [id number]  Date: 08/09/2013  DOB: 05/19/1970  Follow-Up Visit Note  Outpatient  CC: DAUB, [name], MD  [name], MD  Diagnosis and Prior Radiotherapy:   Stage II clinical [id number] Right breast ER/PR positive HER-2/neu negative grade 2 invasive ductal carcinoma. ypT1c, ypN1A  Indication for treatment: curative  Radiation treatment dates: 05/08/2013-06/22/2013  Site/dose: Site/dose:  1) Right breast / 50.4 Gy in 28 fractions  2) Right Supraclavicular fossa/ 50.4 Gy in 28 fractions  3) Right Posterior Axillary boost / 11.9 Gy in 28 fractions  4) Right breast boost / 10 Gy in 5 fractions   Narrative:  The patient returns today for routine follow-up.  She is receiving aromasin and Zoladex. Doing well.   Her main question is whether any additional management is needed for her spherocytosis and anemia. This was put on the "back burner" upon her breast cancer diagnosis but initially discussed with Dr. Humphrey Rolls.                    ALLERGIES:  has No Known Allergies.  Meds: Current Outpatient Prescriptions  Medication Sig Dispense Refill  . Calcium Carb-Cholecalciferol (CALCIUM PLUS VITAMIN D3) 600-500 MG-UNIT CAPS Take 1 tablet by mouth 1 day or 1 dose.      Marland Kitchen exemestane (AROMASIN) 25 MG tablet Take 1 tablet (25 mg total) by mouth daily after breakfast.  90 tablet  2  . folic acid (FOLVITE) 007 MCG tablet Take 400 mcg by mouth daily.       . non-metallic deodorant Jethro Poling) MISC Apply 1 application topically daily.      . Prenatal Vit-Fe Fumarate-FA (PRENATAL MULTIVITAMIN) TABS tablet Take 1 tablet by mouth daily at 12 noon.       No current facility-administered medications for this encounter.    Physical Findings: The patient is in no acute distress. Patient is alert and oriented.  height is _0  (1.651 m) and weight is 205 lb 12.8 oz (93.35 kg). Her temperature is 97.7 F (36.5  C). Her blood pressure is 124/61 and her pulse is 88. .    Right breast: slight residual hyperpigmentation in patches over right breast and low neck. Skin intact with minimal dryness.   Lab Findings: Lab Results  Component Value Date   WBC 9.9 07/25/2013   HGB 10.2* 07/25/2013   HCT 29.8* 07/25/2013   MCV 83.7 07/25/2013   PLT 199 07/25/2013    Radiographic Findings: No results found.  Impression/Plan:  Continue Vit E Lotion or oil over R neck/ upper back / breast for at least 1 more month for healing.  Her main question is whether any additional management is needed for her spherocytosis and anemia. This was put on the "back burner" upon her breast cancer diagnosis but initially discussed with Dr. Humphrey Rolls.    I will mention this to the covering providers Mikey Bussing, Dr. Earnest Conroy)              I encouraged her to continue with yearly mammography and followup with medical oncology. I will see her back on an as-needed basis. I have encouraged her to call if she has any issues or concerns in the future. I wished her the very best.   --------------------------------------------------------   Eppie Gibson, MD

## 2013-08-12 ENCOUNTER — Ambulatory Visit (INDEPENDENT_AMBULATORY_CARE_PROVIDER_SITE_OTHER): Payer: BC Managed Care – PPO | Admitting: Emergency Medicine

## 2013-08-12 VITALS — BP 120/76 | HR 74 | Temp 98.0°F | Resp 17 | Ht 65.0 in | Wt 203.0 lb

## 2013-08-12 DIAGNOSIS — Z23 Encounter for immunization: Secondary | ICD-10-CM

## 2013-08-12 NOTE — Progress Notes (Signed)
[facility] [address], [place] [address] [phone]  Date:  08/12/2013   Name:  Mary Dougherty   DOB:  1970/04/30   MRN:  [id number]  PCP:  Jenny Reichmann, MD    Chief Complaint: Immunizations   History of Present Illness:  Mary Dougherty is a 43 y.o. very pleasant female patient who presents with the following:  Comes requesting a tetanus shot.  Denies other complaint or health concern today.   Patient Active Problem List   Diagnosis Date Noted  . Breast cancer   . Anemia, unspecified 01/27/2013  . Syncope, vasovagal 01/17/2013    Class: Chronic  . Genital herpes 10/14/2012  . Breast cancer of lower-outer quadrant of right female breast 09/08/2012    Past Medical History  Diagnosis Date  . Anemia   . Thyroid disease   . Wears glasses   . Spherocytosis 1989  . Invasive ductal carcinoma of right breast 09/2012    ER(99%), PR(100%), Ki-67(70%), 1/5 nodes positive  . Chronic anemia 03/03/13  . Status post chemotherapy 10/07/12 - 12/16/12     Neoadjuvant chemotherapy with dose dense FEC (5-FU/epirubicin/Cytoxan) from 10/07/2012 through 12/16/12.    . S/P radiation therapy 05/08/2013-06/22/2013    1) Right breast / 50.4 Gy in 28 fractions / 2) Right Supraclavicular fossa/ 50.4 Gy in 28 fractions /3) Right Posterior Axillary boost / 11.9 Gy in 28 fractions /4) Right breast boost / 10 Gy in 5 fractions   . Use of goserelin acetate (Zoladex) 07/25/13  . Use of exemestane (Aromasin) 07/25/13    Past Surgical History  Procedure Laterality Date  . Wisdom tooth extraction    . Portacath placement Left 09/20/2012    Procedure: INSERTION PORT-A-CATH;  Surgeon: [name], MD;  Location: [facility];  Service: General;  Laterality: Left;  . Port-a-cath removal N/A 04/06/2013    Procedure: REMOVAL PORT-A-CATH;  Surgeon: [name], MD;  Location: [facility];  Service: General;  Laterality: N/A;  . Breast surgery       left breast lumpectomy/left ax snbx    History  Substance Use Topics  . Smoking status: Never Smoker   . Smokeless tobacco: Never Used  . Alcohol Use: No    Family History  Problem Relation Age of Onset  . Pulmonary embolism Maternal Uncle 17    died instantly  . Melanoma Cousin     paternal cousin dx in her 57s  . Heart disease Father   . Diabetes Father     No Known Allergies  Medication list has been reviewed and updated.  Current Outpatient Prescriptions on File Prior to Visit  Medication Sig Dispense Refill  . Calcium Carb-Cholecalciferol (CALCIUM PLUS VITAMIN D3) 600-500 MG-UNIT CAPS Take 1 tablet by mouth 1 day or 1 dose.      Marland Kitchen exemestane (AROMASIN) 25 MG tablet Take 1 tablet (25 mg total) by mouth daily after breakfast.  90 tablet  2  . folic acid (FOLVITE) 389 MCG tablet Take 400 mcg by mouth daily.       . non-metallic deodorant Jethro Poling) MISC Apply 1 application topically daily.      . Prenatal Vit-Fe Fumarate-FA (PRENATAL MULTIVITAMIN) TABS tablet Take 1 tablet by mouth daily at 12 noon.       No current facility-administered medications on file prior to visit.    Review of Systems:  As per HPI, otherwise negative.    Physical Examination: Filed Vitals:  08/12/13 1042  BP: 120/76  Pulse: 74  Temp: 98 F (36.7 C)  Resp: 17   Filed Vitals:   08/12/13 1042  Height: 5' 5" (1.651 m)  Weight: 203 lb (92.08 kg)   Body mass index is 33.78 kg/(m^2). Ideal Body Weight: Weight in (lb) to have BMI = 25: 149.9   GEN: WDWN, NAD, Non-toxic, Alert & Oriented x 3 HEENT: Atraumatic, Normocephalic.  Ears and Nose: No external deformity. EXTR: No clubbing/cyanosis/edema NEURO: Normal gait.  PSYCH: Normally interactive. Conversant. Not depressed or anxious appearing.  Calm demeanor.    Assessment and Plan: Tetanus immunization   Signed,  Ellison Carwin, MD

## 2013-08-12 NOTE — Patient Instructions (Signed)
Tetanus, Diphtheria, Pertussis (Tdap) Vaccine  What You Need to Know  WHY GET VACCINATED?  Tetanus, diphtheria and pertussis can be very serious diseases, even for adolescents and adults. Tdap vaccine can protect us from these diseases.  TETANUS (Lockjaw) causes painful muscle tightening and stiffness, usually all over the body.  · It can lead to tightening of muscles in the head and neck so you can't open your mouth, swallow, or sometimes even breathe. Tetanus kills about 1 out of 5 people who are infected.  DIPHTHERIA can cause a thick coating to form in the back of the throat.  · It can lead to breathing problems, paralysis, heart failure, and death.  PERTUSSIS (Whooping Cough) causes severe coughing spells, which can cause difficulty breathing, vomiting and disturbed sleep.  · It can also lead to weight loss, incontinence, and rib fractures. Up to 2 in 100 adolescents and 5 in 100 adults with pertussis are hospitalized or have complications, which could include pneumonia and death.  These diseases are caused by bacteria. Diphtheria and pertussis are spread from person to person through coughing or sneezing. Tetanus enters the body through cuts, scratches, or wounds.  Before vaccines, the [place] saw as many as 200,000 cases a year of diphtheria and pertussis, and hundreds of cases of tetanus. Since vaccination began, tetanus and diphtheria have dropped by about 99% and pertussis by about 80%.  TDAP VACCINE  Tdap vaccine can protect adolescents and adults from tetanus, diphtheria, and pertussis. One dose of Tdap is routinely given at age 11 or 12. People who did not get Tdap at that age should get it as soon as possible.  Tdap is especially important for health care professionals and anyone having close contact with a baby younger than 12 months.  Pregnant women should get a dose of Tdap during every pregnancy, to protect the newborn from pertussis. Infants are most at risk for severe, life-threatening  complications from pertussis.  A similar vaccine, called Td, protects from tetanus and diphtheria, but not pertussis. A Td booster should be given every 10 years. Tdap may be given as one of these boosters if you have not already gotten a dose. Tdap may also be given after a severe cut or burn to prevent tetanus infection.  Your doctor can give you more information.  Tdap may safely be given at the same time as other vaccines.  SOME PEOPLE SHOULD NOT GET THIS VACCINE  · If you ever had a life-threatening allergic reaction after a dose of any tetanus, diphtheria, or pertussis containing vaccine, OR if you have a severe allergy to any part of this vaccine, you should not get Tdap. Tell your doctor if you have any severe allergies.  · If you had a coma, or long or multiple seizures within 7 days after a childhood dose of DTP or DTaP, you should not get Tdap, unless a cause other than the vaccine was found. You can still get Td.  · Talk to your doctor if you:  ¨ have epilepsy or another nervous system problem,  ¨ had severe pain or swelling after any vaccine containing diphtheria, tetanus or pertussis,  ¨ ever had Guillain-Barré Syndrome (GBS),  ¨ aren't feeling well on the day the shot is scheduled.  RISKS OF A VACCINE REACTION  With any medicine, including vaccines, there is a chance of side effects. These are usually mild and go away on their own, but serious reactions are also possible.  Brief fainting spells can follow   a vaccination, leading to injuries from falling. Sitting or lying down for about 15 minutes can help prevent these. Tell your doctor if you feel dizzy or light-headed, or have vision changes or ringing in the ears.  Mild problems following Tdap (Did not interfere with activities)  · Pain where the shot was given (about 3 in 4 adolescents or 2 in 3 adults)  · Redness or swelling where the shot was given (about 1 person in 5)  · Mild fever of at least 100.4°F (up to about 1 in 25 adolescents or 1 in  100 adults)  · Headache (about 3 or 4 people in 10)  · Tiredness (about 1 person in 3 or 4)  · Nausea, vomiting, diarrhea, stomach ache (up to 1 in 4 adolescents or 1 in 10 adults)  · Chills, body aches, sore joints, rash, swollen glands (uncommon)  Moderate problems following Tdap (Interfered with activities, but did not require medical attention)  · Pain where the shot was given (about 1 in 5 adolescents or 1 in 100 adults)  · Redness or swelling where the shot was given (up to about 1 in 16 adolescents or 1 in 25 adults)  · Fever over 102°F (about 1 in 100 adolescents or 1 in 250 adults)  · Headache (about 3 in 20 adolescents or 1 in 10 adults)  · Nausea, vomiting, diarrhea, stomach ache (up to 1 or 3 people in 100)  · Swelling of the entire arm where the shot was given (up to about 3 in 100).  Severe problems following Tdap (Unable to perform usual activities, required medical attention)  · Swelling, severe pain, bleeding and redness in the arm where the shot was given (rare).  A severe allergic reaction could occur after any vaccine (estimated less than 1 in a million doses).  WHAT IF THERE IS A SERIOUS REACTION?  What should I look for?  · Look for anything that concerns you, such as signs of a severe allergic reaction, very high fever, or behavior changes.  Signs of a severe allergic reaction can include hives, swelling of the face and throat, difficulty breathing, a fast heartbeat, dizziness, and weakness. These would start a few minutes to a few hours after the vaccination.  What should I do?  · If you think it is a severe allergic reaction or other emergency that can't wait, call 9-1-1 or get the person to the nearest hospital. Otherwise, call your doctor.  · Afterward, the reaction should be reported to the "Vaccine Adverse Event Reporting System" (VAERS). Your doctor might file this report, or you can do it yourself through the VAERS web site at www.vaers.hhs.gov, or by calling 1-800-822-7967.  VAERS is  only for reporting reactions. They do not give medical advice.   THE NATIONAL VACCINE INJURY COMPENSATION PROGRAM  The National Vaccine Injury Compensation Program (VICP) is a federal program that was created to compensate people who may have been injured by certain vaccines.  Persons who believe they may have been injured by a vaccine can learn about the program and about filing a claim by calling 1-800-338-2382 or visiting the VICP website at www.hrsa.gov/vaccinecompensation.  HOW CAN I LEARN MORE?  · Ask your doctor.  · Call your local or state health department.  · Contact the Centers for Disease Control and Prevention (CDC):  ¨ Call 1-800-232-4636 or visit CDC's website at www.cdc.gov/vaccines.  CDC Tdap Vaccine VIS (06/11/11)  Document Released: 07/21/2011 Document Revised: 05/16/2012 Document Reviewed: 05/11/2012  ExitCare®   Patient Information ©2015 [employer]. This information is not intended to replace advice given to you by your health care provider. Make sure you discuss any questions you have with your health care provider.

## 2013-08-21 ENCOUNTER — Other Ambulatory Visit: Payer: Self-pay

## 2013-08-22 ENCOUNTER — Telehealth: Payer: Self-pay | Admitting: Oncology

## 2013-08-22 ENCOUNTER — Ambulatory Visit (HOSPITAL_BASED_OUTPATIENT_CLINIC_OR_DEPARTMENT_OTHER): Payer: BC Managed Care – PPO

## 2013-08-22 ENCOUNTER — Other Ambulatory Visit (HOSPITAL_BASED_OUTPATIENT_CLINIC_OR_DEPARTMENT_OTHER): Payer: BC Managed Care – PPO

## 2013-08-22 ENCOUNTER — Telehealth: Payer: Self-pay

## 2013-08-22 ENCOUNTER — Ambulatory Visit (HOSPITAL_BASED_OUTPATIENT_CLINIC_OR_DEPARTMENT_OTHER): Payer: BC Managed Care – PPO | Admitting: Oncology

## 2013-08-22 ENCOUNTER — Encounter: Payer: Self-pay | Admitting: Oncology

## 2013-08-22 VITALS — BP 139/78 | HR 85 | Temp 97.9°F | Resp 18 | Ht 65.0 in | Wt 207.6 lb

## 2013-08-22 DIAGNOSIS — C50919 Malignant neoplasm of unspecified site of unspecified female breast: Secondary | ICD-10-CM

## 2013-08-22 DIAGNOSIS — C50511 Malignant neoplasm of lower-outer quadrant of right female breast: Secondary | ICD-10-CM

## 2013-08-22 DIAGNOSIS — C50911 Malignant neoplasm of unspecified site of right female breast: Secondary | ICD-10-CM

## 2013-08-22 DIAGNOSIS — Z170 Estrogen receptor positive status [ER+]: Secondary | ICD-10-CM

## 2013-08-22 DIAGNOSIS — D649 Anemia, unspecified: Secondary | ICD-10-CM

## 2013-08-22 DIAGNOSIS — Z5111 Encounter for antineoplastic chemotherapy: Secondary | ICD-10-CM

## 2013-08-22 LAB — COMPREHENSIVE METABOLIC PANEL (CC13)
ALK PHOS: 89 U/L (ref 40–150)
ALT: 31 U/L (ref 0–55)
AST: 17 U/L (ref 5–34)
Albumin: 3.9 g/dL (ref 3.5–5.0)
Anion Gap: 8 mEq/L (ref 3–11)
BUN: 7.5 mg/dL (ref 7.0–26.0)
CALCIUM: 9.5 mg/dL (ref 8.4–10.4)
CO2: 26 mEq/L (ref 22–29)
CREATININE: 0.7 mg/dL (ref 0.6–1.1)
Chloride: 108 mEq/L (ref 98–109)
Glucose: 78 mg/dl (ref 70–140)
Potassium: 4 mEq/L (ref 3.5–5.1)
Sodium: 143 mEq/L (ref 136–145)
Total Bilirubin: 1.1 mg/dL (ref 0.20–1.20)
Total Protein: 7 g/dL (ref 6.4–8.3)

## 2013-08-22 LAB — CBC WITH DIFFERENTIAL/PLATELET
BASO%: 0.7 % (ref 0.0–2.0)
Basophils Absolute: 0.1 10*3/uL (ref 0.0–0.1)
EOS%: 2 % (ref 0.0–7.0)
Eosinophils Absolute: 0.2 10*3/uL (ref 0.0–0.5)
HEMATOCRIT: 29.2 % — AB (ref 34.8–46.6)
HGB: 9.8 g/dL — ABNORMAL LOW (ref 11.6–15.9)
LYMPH%: 12.1 % — ABNORMAL LOW (ref 14.0–49.7)
MCH: 27.5 pg (ref 25.1–34.0)
MCHC: 33.5 g/dL (ref 31.5–36.0)
MCV: 82.1 fL (ref 79.5–101.0)
MONO#: 0.6 10*3/uL (ref 0.1–0.9)
MONO%: 5.7 % (ref 0.0–14.0)
NEUT#: 8.9 10*3/uL — ABNORMAL HIGH (ref 1.5–6.5)
NEUT%: 79.5 % — AB (ref 38.4–76.8)
Platelets: 215 10*3/uL (ref 145–400)
RBC: 3.56 10*6/uL — ABNORMAL LOW (ref 3.70–5.45)
RDW: 22.7 % — AB (ref 11.2–14.5)
WBC: 11.3 10*3/uL — ABNORMAL HIGH (ref 3.9–10.3)
lymph#: 1.4 10*3/uL (ref 0.9–3.3)

## 2013-08-22 MED ORDER — GOSERELIN ACETATE 3.6 MG ~~LOC~~ IMPL
3.6000 mg | DRUG_IMPLANT | Freq: Once | SUBCUTANEOUS | Status: AC
  Administered 2013-08-22: 3.6 mg via SUBCUTANEOUS
  Filled 2013-08-22: qty 3.6

## 2013-08-22 NOTE — Telephone Encounter (Signed)
Received bone density report from [facility] 08/09/13 Dr. Marcelo Baldy.  Sent to scan.

## 2013-08-22 NOTE — Telephone Encounter (Signed)
gv adn printed appt sched anda vs for pt for Aug °

## 2013-08-22 NOTE — Progress Notes (Signed)
[facility]  [phone] [phone]  OFFICE PROGRESS NOTE    ID: PA TENNANT   DOB: 1970-07-10  MR#: [id number]  OEV#:[id number]   PCP: Jenny Reichmann, MD GI: Lucio Edward, M.D. Dr. [name] Dr. Eppie Gibson  Chief complaint: F/u visit for breast cancer  DIAGNOSIS:  Mary Dougherty is a 43 y.o. female with invasive ductal carcinoma of the right breast -estrogen receptor positive, progesterone receptor positive, HER-2/neu negative-Diagnosed 09/2012   PRIOR THERAPY: From original intake note:  1.  Patient went for her initial screening mammogram at the insistence of her sister. She was found to have a mass in the right breast. The ultrasound demonstrated a 1.4 cm area of concern. She had an MRI performed that revealed a 2.7 cm irregular area at the 6 o'clock location. There were no masses in the left breast no abnormal lymph nodes. Patient had a biopsy performed of the right breast that revealed an intermediate to high-grade invasive ductal carcinoma the tumor was estrogen receptor positive 99%, progesterone receptor positive 100%,  HER-2/neu negative with a Ki-67 of 70%.   2. S/P neoadjuvant chemotherapy with dose dense FEC (5-FU/epirubicin/Cytoxan) from 10/07/2012 through 12/16/12.  Followed by weekly Taxol on 12/30/12 - 03/17/13.    3.  Acute on chronic anemia.   Per review of old records by Dr. Tressie Stalker she has previously been diagnosed with hereditary spherocytosis.  Several of her family members suffer from the same.    INTERVAL HISTORY: Mary Dougherty is a  43 y.o. female who returns for a follow up visit. She received her first Zoladex injection 1 month ago. She also started Aromasin  25 mg daily in June 2015. Has mild hot flashes that are tolerable. At present she denies any shortness of breath, constipation, weight loss or decrease in appetite, dizziness, blurred vision or head aches. Denies arthralgias. She has questions  about her anemia today and her family history of hereditary spherocytosis.   MEDICAL HISTORY: Past Medical History  Diagnosis Date  . Anemia   . Thyroid disease   . Wears glasses   . Spherocytosis 1989  . Invasive ductal carcinoma of right breast 09/2012    ER(99%), PR(100%), Ki-67(70%), 1/5 nodes positive  . Chronic anemia 03/03/13  . Status post chemotherapy 10/07/12 - 12/16/12     Neoadjuvant chemotherapy with dose dense FEC (5-FU/epirubicin/Cytoxan) from 10/07/2012 through 12/16/12.    . S/P radiation therapy 05/08/2013-06/22/2013    1) Right breast / 50.4 Gy in 28 fractions / 2) Right Supraclavicular fossa/ 50.4 Gy in 28 fractions /3) Right Posterior Axillary boost / 11.9 Gy in 28 fractions /4) Right breast boost / 10 Gy in 5 fractions   . Use of goserelin acetate (Zoladex) 07/25/13  . Use of exemestane (Aromasin) 07/25/13    ALLERGIES:   No Known Allergies   MEDICATIONS:  Current Outpatient Prescriptions  Medication Sig Dispense Refill  . Calcium Carb-Cholecalciferol (CALCIUM PLUS VITAMIN D3) 600-500 MG-UNIT CAPS Take 1 tablet by mouth 1 day or 1 dose.      Marland Kitchen exemestane (AROMASIN) 25 MG tablet Take 1 tablet (25 mg total) by mouth daily after breakfast.  90 tablet  2  . folic acid (FOLVITE) 829 MCG tablet Take 400 mcg by mouth daily.       . non-metallic deodorant Jethro Poling) MISC Apply 1 application topically daily.      . Prenatal Vit-Fe Fumarate-FA (PRENATAL MULTIVITAMIN) TABS tablet Take 1 tablet by mouth daily at  12 noon.       No current facility-administered medications for this visit.    SURGICAL HISTORY:  Past Surgical History  Procedure Laterality Date  . Wisdom tooth extraction    . Portacath placement Left 09/20/2012    Procedure: INSERTION PORT-A-CATH;  Surgeon: [name], MD;  Location: [facility];  Service: General;  Laterality: Left;  . Port-a-cath removal N/A 04/06/2013    Procedure: REMOVAL PORT-A-CATH;  Surgeon: [name], MD;   Location: [facility];  Service: General;  Laterality: N/A;  . Breast surgery      left breast lumpectomy/left ax snbx    REVIEW OF SYSTEMS:  A 10 point review of systems was completed and is negative except as stated above.    PHYSICAL EXAMINATION:  BP 139/78  Pulse 85  Temp(Src) 97.9 F (36.6 C) (Oral)  Resp 18  Ht $R'5\' 5"'uj$  (1.651 m)  Wt 207 lb 9.6 oz (94.167 kg)  BMI 34.55 kg/m2 GENERAL: Patient is a well appearing female in no acute distress HEENT:  Sclerae anicteric.  Oropharynx clear and moist. No ulcerations or evidence of oropharyngeal candidiasis. Neck is supple.  NODES:  No cervical, supraclavicular, or axillary lymphadenopathy palpated.  BREAST EXAM: Deferred. LUNGS:  Clear to auscultation bilaterally.  No wheezes or rhonchi. HEART:  Regular rate and rhythm. No murmur appreciated. ABDOMEN:  Soft, nontender.  Positive, normoactive bowel sounds. No organomegaly palpated. MSK:  No focal spinal tenderness to palpation. Full range of motion bilaterally in the upper extremities. EXTREMITIES:  No peripheral edema.   SKIN:  Clear with no obvious rashes or skin changes. No nail dyscrasia. NEURO:  Nonfocal. Well oriented.  Appropriate affect. ECOG FS: 1 - Symptomatic but completely ambulatory  LABORATORY DATA: Lab Results  Component Value Date   WBC 11.3* 08/22/2013   HGB 9.8* 08/22/2013   HCT 29.2* 08/22/2013   MCV 82.1 08/22/2013   PLT 215 08/22/2013      Chemistry      Component Value Date/Time   NA 143 08/22/2013 1406   NA 136 10/26/2012 0918   K 4.0 08/22/2013 1406   K 3.3* 10/26/2012 0918   CL 101 10/26/2012 0918   CO2 26 08/22/2013 1406   CO2 27 10/26/2012 0918   BUN 7.5 08/22/2013 1406   BUN 15 10/26/2012 0918   CREATININE 0.7 08/22/2013 1406   CREATININE 0.60 10/26/2012 0918   GLU 124* 03/10/2013 1438      Component Value Date/Time   CALCIUM 9.5 08/22/2013 1406   CALCIUM 8.4 10/26/2012 0918   ALKPHOS 89 08/22/2013 1406   ALKPHOS 118* 10/26/2012 0918   AST  17 08/22/2013 1406   AST 12 10/26/2012 0918   ALT 31 08/22/2013 1406   ALT 18 10/26/2012 0918   BILITOT 1.10 08/22/2013 1406   BILITOT 0.6 10/26/2012 0918     ADDITIONAL INFORMATION: 1. PROGNOSTIC INDICATORS - ACIS Results: IMMUNOHISTOCHEMICAL AND MORPHOMETRIC ANALYSIS BY THE AUTOMATED CELLULAR IMAGING SYSTEM (ACIS) Estrogen Receptor: 97%, POSITIVE, STRONG STAINING INTENSITY Progesterone Receptor: 95%, POSITIVE, STRONG STAINING INTENSITY REFERENCE RANGE ESTROGEN RECEPTOR NEGATIVE <1% POSITIVE =>1% PROGESTERONE RECEPTOR NEGATIVE <1% POSITIVE =>1% All controls stained appropriately Enid Cutter MD Pathologist, Electronic Signature ( Signed 04/12/2013) 1. CHROMOGENIC IN-SITU HYBRIDIZATION Results: HER-2/NEU BY CISH - NO AMPLIFICATION OF HER-2 DETECTED. RESULT RATIO OF HER2: CEP 17 SIGNALS 1.16 AVERAGE HER2 COPY NUMBER PER CELL 2.50 REFERENCE RANGE 1 of 4 Duplicate copy FINAL for MARAKI, MACQUARRIE ([id number]) ADDITIONAL INFORMATION:(continued) NEGATIVE HER2/Chr17 Ratio <2.0 and  Average HER2 copy number <4.0 EQUIVOCAL HER2/Chr17 Ratio <2.0 and Average HER2 copy number 4.0 and <6.0 POSITIVE HER2/Chr17 Ratio >=2.0 and/or Average HER2 copy number >=6.0 Enid Cutter MD Pathologist, Electronic Signature ( Signed 04/11/2013) FINAL DIAGNOSIS Diagnosis 1. Breast, lumpectomy, Right - INVASIVE DUCTAL CARCINOMA, GRADE III/III, SPANNING 1.1 CM. - DUCTAL CARCINOMA IN SITU, HIGH GRADE. - PERINEURAL INVASION IS IDENTIFIED. - INVASIVE CARCINOMA IS FOCALLY 0.1 CM TO THE INFERIOR MARGIN. - SEE ONCOLOGY TABLET BELOW. 2. Lymph node, sentinel, biopsy, right axillary #1 - BENIGN FIBROADIPOSE TISSUE. - LYMPH NODAL TISSUE IS NOT IDENTIFIED. 3. Lymph node, sentinel, biopsy, right axillary #2 - BENIGN FIBROADIPOSE TISSUE AND SMALL FRAGMENT OF SKELETAL MUSCLE. - LYMPH NODAL TISSUE IS NOT IDENTIFIED. 4. Lymph node, sentinel, biopsy, right axillary #3 - BENIGN FIBROADIPOSE TISSUE. - LYMPH NODAL  TISSUE IS NOT IDENTIFIED. 5. Lymph node, sentinel, biopsy, right axillary #4 - METASTATIC CARCINOMA IN 1 OF 1 LYMPH NODE (1/1). 6. Lymph node, sentinel, biopsy, right axillary #5 - THERE IS NO EVIDENCE OF CARCINOMA IN 1 OF 1 LYMPH NODE (0/1). Microscopic Comment 1. BREAST, INVASIVE TUMOR, WITH LYMPH NODE SAMPLING Specimen, including laterality and lymph node sampling (sentinel, non-sentinel): Right breast with right axillary sentinel nodes. Procedure: Lumpectomy and sentinel node resection. Histologic type: Ductal. Grade: III Tubule formation: 3 Nuclear pleomorphism: 2 Mitotic:3 Tumor size (gross measurement): 1.1 cm Margins: Invasive, distance to closest margin: Focally 0.1 cm to the inferior margin. In-situ, distance to closest margin: Greater than 0.2 cm to all margins. Lymphovascular invasion: Not identified. 2 of 4 Duplicate copy FINAL for RAECHELL, SINGLETON ([id number]) Microscopic Comment(continued) Ductal carcinoma in situ: Present. Grade: High grade. Extensive intraductal component: Not identified. Lobular neoplasia: Not identified. Tumor focality: Unifocal. Treatment effect: Minimal. If present, treatment effect in breast tissue, lymph nodes or both: Breast tissue. Extent of tumor: Confined to breast parenchyma. Lymph nodes: Examined: 2 Sentinel 0 Non-sentinel 2 Total Lymph nodes with metastasis: 1 Isolated tumor cells (< 0.2 mm): 0 Micrometastasis: (> 0.2 mm and < 2.0 mm): 0 Macrometastasis: (> 2.0 mm): 1 Extracapsular extension: Not identified. Breast prognostic profile: Will be repeated on the current case and the results reported separately. Non-neoplastic breast: Healing biopsy site and fibrocystic changes with calcifications. TNM: ypT1c, ypN1A (MM:ecj 04/07/2013) JOSHUA  RADIOGRAPHIC STUDIES: No results found.   ASSESSMENT/PLAN:   YUSRA RAVERT is a 44 y.o. female with:   #1  Right breast invasive ductal carcinoma-Stage II , ER/PR positive  HER-2/neu negative grade 2, s/p neoadjuvant chemo followed by surgery  #2 completed Adjuvant Radiation therapy from 05/08/2013 to 06/22/2013  #3 The patient started Zoladex and Aromasin in June 2015 and is tolerating this well. She will continue the Aromasin. She received her Zoladex injection today.   #4 History of chronic anemia likely due to hereditary spherocytosis. Her hemoglobin and hematocrit is stable.She is on  folic acid as well as prenatal vitamins. Reviewed her history and labs today with Dr Alen Blew. She has no evidence of hemolysis on her labs today. I have recommended that we continue to follow her labs monthly when she is here for her injection. Further work-up may be indicated at some point in the future, but her Hemoglobin has been stable. She has only required blood transfusions during her chemotherapy. She is asymptomatic today.  #5. DEXA scan has been done, but results are not yet available to me. We have contacted [facility] for these results. Continue calcium and Vitamin D.   Next F/u visit in one month  for zoladex inj, cbc and cmp   All questions were answered. She knows  to contact us in the interim with any questions, concerns  I spent 20 minutes counseling the patient face to face.  The total time spent in the appointment was 30 minutes.  Mikey Bussing, DNP, AGPCNP-BC 08/22/2013, 3:59 PM

## 2013-09-12 ENCOUNTER — Telehealth: Payer: Self-pay | Admitting: Hematology

## 2013-09-12 NOTE — Telephone Encounter (Signed)
, °

## 2013-09-13 ENCOUNTER — Telehealth: Payer: Self-pay

## 2013-09-13 NOTE — Telephone Encounter (Signed)
Rcvd by fax from [facility] mammogram dated 09/12/13.  Copy to [place].  Original to scan.

## 2013-09-19 ENCOUNTER — Ambulatory Visit: Payer: BC Managed Care – PPO

## 2013-09-19 ENCOUNTER — Other Ambulatory Visit: Payer: BC Managed Care – PPO

## 2013-09-20 ENCOUNTER — Other Ambulatory Visit: Payer: Self-pay | Admitting: *Deleted

## 2013-09-20 ENCOUNTER — Ambulatory Visit (HOSPITAL_BASED_OUTPATIENT_CLINIC_OR_DEPARTMENT_OTHER): Payer: BC Managed Care – PPO | Admitting: Hematology

## 2013-09-20 ENCOUNTER — Ambulatory Visit: Payer: BC Managed Care – PPO

## 2013-09-20 ENCOUNTER — Other Ambulatory Visit (HOSPITAL_BASED_OUTPATIENT_CLINIC_OR_DEPARTMENT_OTHER): Payer: BC Managed Care – PPO

## 2013-09-20 VITALS — BP 142/51 | HR 85 | Temp 98.4°F | Resp 18 | Ht 65.0 in | Wt 208.3 lb

## 2013-09-20 DIAGNOSIS — Z5111 Encounter for antineoplastic chemotherapy: Secondary | ICD-10-CM

## 2013-09-20 DIAGNOSIS — C773 Secondary and unspecified malignant neoplasm of axilla and upper limb lymph nodes: Secondary | ICD-10-CM

## 2013-09-20 DIAGNOSIS — C50911 Malignant neoplasm of unspecified site of right female breast: Secondary | ICD-10-CM

## 2013-09-20 DIAGNOSIS — C50511 Malignant neoplasm of lower-outer quadrant of right female breast: Secondary | ICD-10-CM

## 2013-09-20 DIAGNOSIS — D580 Hereditary spherocytosis: Secondary | ICD-10-CM | POA: Insufficient documentation

## 2013-09-20 DIAGNOSIS — C50919 Malignant neoplasm of unspecified site of unspecified female breast: Secondary | ICD-10-CM

## 2013-09-20 DIAGNOSIS — Z170 Estrogen receptor positive status [ER+]: Secondary | ICD-10-CM

## 2013-09-20 LAB — CBC WITH DIFFERENTIAL/PLATELET
BASO%: 0.2 % (ref 0.0–2.0)
Basophils Absolute: 0 10*3/uL (ref 0.0–0.1)
EOS ABS: 0.2 10*3/uL (ref 0.0–0.5)
EOS%: 1.9 % (ref 0.0–7.0)
HCT: 27.3 % — ABNORMAL LOW (ref 34.8–46.6)
HGB: 9.4 g/dL — ABNORMAL LOW (ref 11.6–15.9)
LYMPH%: 13.7 % — AB (ref 14.0–49.7)
MCH: 28.1 pg (ref 25.1–34.0)
MCHC: 34.4 g/dL (ref 31.5–36.0)
MCV: 81.7 fL (ref 79.5–101.0)
MONO#: 0.6 10*3/uL (ref 0.1–0.9)
MONO%: 6 % (ref 0.0–14.0)
NEUT#: 8.3 10*3/uL — ABNORMAL HIGH (ref 1.5–6.5)
NEUT%: 78.2 % — AB (ref 38.4–76.8)
PLATELETS: 211 10*3/uL (ref 145–400)
RBC: 3.34 10*6/uL — AB (ref 3.70–5.45)
RDW: 20.3 % — ABNORMAL HIGH (ref 11.2–14.5)
WBC: 10.6 10*3/uL — AB (ref 3.9–10.3)
lymph#: 1.5 10*3/uL (ref 0.9–3.3)

## 2013-09-20 LAB — COMPREHENSIVE METABOLIC PANEL (CC13)
ALT: 34 U/L (ref 0–55)
ANION GAP: 9 meq/L (ref 3–11)
AST: 25 U/L (ref 5–34)
Albumin: 4 g/dL (ref 3.5–5.0)
Alkaline Phosphatase: 86 U/L (ref 40–150)
BILIRUBIN TOTAL: 1.42 mg/dL — AB (ref 0.20–1.20)
BUN: 14.2 mg/dL (ref 7.0–26.0)
CO2: 26 meq/L (ref 22–29)
Calcium: 9.4 mg/dL (ref 8.4–10.4)
Chloride: 107 mEq/L (ref 98–109)
Creatinine: 0.9 mg/dL (ref 0.6–1.1)
GLUCOSE: 96 mg/dL (ref 70–140)
Potassium: 4 mEq/L (ref 3.5–5.1)
Sodium: 141 mEq/L (ref 136–145)
Total Protein: 7.1 g/dL (ref 6.4–8.3)

## 2013-09-20 MED ORDER — EXEMESTANE 25 MG PO TABS: 25.0000 mg | ORAL_TABLET | Freq: Every day | ORAL | Status: DC

## 2013-09-20 MED ORDER — GOSERELIN ACETATE 3.6 MG ~~LOC~~ IMPL
3.6000 mg | DRUG_IMPLANT | Freq: Once | SUBCUTANEOUS | Status: AC
  Administered 2013-09-20: 3.6 mg via SUBCUTANEOUS

## 2013-09-20 NOTE — Progress Notes (Signed)
[place]  [phone] [phone]) [phone]  OFFICE PROGRESS NOTE    ID: Drexel Iha   DOB: 01/29/1971  MR#: [id number]  VEL#:[id number]   PCP: Jenny Reichmann, MD GI: Lucio Edward, M.D. Dr. [name] Dr. Eppie Gibson, MD Gyn: Brien Few, MD  Chief complaint: F/u visit for breast cancer  DIAGNOSIS:  Mary Dougherty is a 43 y.o. 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  1.  Patient went for her initial screening mammogram at the insistence of her sister. She was found to have a mass in the right breast. The ultrasound demonstrated a 1.4 cm area of concern. She had an MRI performed that revealed a 2.7 cm irregular area at the 6 o'clock location.    There were no masses in the left breast no abnormal lymph nodes. Patient had a biopsy performed of the right breast that revealed an intermediate to high-grade invasive ductal carcinoma the tumor was estrogen receptor positive 99%, progesterone receptor positive 100%,  HER-2/neu negative with a Ki-67 of 70%.   2. S/P neoadjuvant chemotherapy with dose dense FEC (5-FU/epirubicin/Cytoxan) from 10/07/2012 through 12/16/12. Followed by weekly Taxol on 12/30/12 - 03/17/13.    3. She had Right breast Lumpectomy performed by Dr [name] on 04/06/2013 showed a residual 1.1 cm Invasive ductal carcinoma, grade III/III, high grade DCIS, perineural invasion+, 1/2 sentinel Lymph nodes were positive, 3 other sampled areas were not lymph nodes just benign fibroadipose tissue, no LVI, Treatment effect minimal TNM(ypT1C,ypN1A).  4. She completed Adjuvant Radiation therapy from 05/08/2013 to  06/22/2013.   5. She started Zoladex and Aromasin in June 2015 and is tolerating this well based on SOFT trial data.  3.  Acute on chronic anemia.   Per review of old records by Dr. Tressie Stalker she has previously been diagnosed with hereditary spherocytosis (HS).  Several of her family members suffer from the same.   INTERVAL HISTORY: Mary Dougherty is a  43 y.o. female who returns for a follow up visit. She received her first Zoladex injection 2 month ago. She also started Aromasin  25 mg daily in June 2015. Has mild hot flashes that are tolerable. At present she denies any shortness of breath, constipation, weight loss or decrease in appetite, dizziness, blurred vision or head aches. Denies arthralgias. She has questions about her anemia today and her family history of hereditary spherocytosis. She denies any history of gall stones or venous ulcers.She has had received blood products in past about 3-4 x and last transfusion was prior to her lumpectomy to increase her H/H. She just had a mammogram done at [facility] on 09/12/2013 which showed heterogeneously dense breasts, post surgical changes in right breast, no significant masses, calcifications or other findings seen. It was a BIRADS category 2 benign mamogram.  MEDICAL HISTORY: Past Medical History  Diagnosis Date  . Anemia   . Thyroid disease   . Wears glasses   . Spherocytosis 1989  . Invasive ductal carcinoma of right breast 09/2012    ER(99%), PR(100%), Ki-67(70%), 1/5 nodes positive  . Chronic anemia 03/03/13  . Status post chemotherapy 10/07/12 - 12/16/12     Neoadjuvant chemotherapy with dose dense FEC (5-FU/epirubicin/Cytoxan) from 10/07/2012 through 12/16/12.    Marland Kitchen  S/P radiation therapy 05/08/2013-06/22/2013    1) Right breast / 50.4 Gy in 28 fractions / 2) Right Supraclavicular fossa/ 50.4 Gy in 28 fractions /3) Right Posterior Axillary boost / 11.9 Gy in 28 fractions /4) Right breast boost / 10 Gy in 5 fractions   . Use of goserelin  acetate (Zoladex) 07/25/13  . Use of exemestane (Aromasin) 07/25/13    ALLERGIES:   No Known Allergies   MEDICATIONS:  Current Outpatient Prescriptions  Medication Sig Dispense Refill  . Calcium Carb-Cholecalciferol (CALCIUM PLUS VITAMIN D3) 600-500 MG-UNIT CAPS Take 1 tablet by mouth 1 day or 1 dose.      Marland Kitchen exemestane (AROMASIN) 25 MG tablet Take 1 tablet (25 mg total) by mouth daily after breakfast.  90 tablet  2  . folic acid (FOLVITE) 943 MCG tablet Take 400 mcg by mouth daily.       . non-metallic deodorant Jethro Poling) MISC Apply 1 application topically daily.      . Prenatal Vit-Fe Fumarate-FA (PRENATAL MULTIVITAMIN) TABS tablet Take 1 tablet by mouth daily at 12 noon.       No current facility-administered medications for this visit.    SURGICAL HISTORY:  Past Surgical History  Procedure Laterality Date  . Wisdom tooth extraction    . Portacath placement Left 09/20/2012    Procedure: INSERTION PORT-A-CATH;  Surgeon: [name], MD;  Location: [place];  Service: General;  Laterality: Left;  . Port-a-cath removal N/A 04/06/2013    Procedure: REMOVAL PORT-A-CATH;  Surgeon: [name], MD;  Location: [place];  Service: General;  Laterality: N/A;  . Breast surgery      left breast lumpectomy/left ax snbx    REVIEW OF SYSTEMS:  A 10 point review of systems was completed and is negative except as stated above.    PHYSICAL EXAMINATION:  BP 142/51  Pulse 85  Temp(Src) 98.4 F (36.9 C) (Oral)  Resp 18  Ht _0  (1.651 m)  Wt 208 lb 4.8 oz (94.484 kg)  BMI 34.66 kg/m2 GENERAL: Patient is a well appearing female in no acute distress HEENT:  Sclerae anicteric.  Oropharynx clear and moist. No ulcerations or evidence of oropharyngeal candidiasis. Neck is supple.  NODES:  No cervical, supraclavicular, or axillary lymphadenopathy palpated.  BREAST EXAM: Both breasts examined in presence of RN. No discrete lump or masses felt, some skin  thickening of right breast, no palpable adenopathy noted. She has fibrocystic breasts "lumpy breasts". No skin or nipple changes noted. LUNGS:  Clear to auscultation bilaterally.  No wheezes or rhonchi. HEART:  Regular rate and rhythm. No murmur appreciated. ABDOMEN:  Soft, nontender.  Positive, normoactive bowel sounds. No organomegaly palpated. MSK:  No focal spinal tenderness to palpation. Full range of motion bilaterally in the upper extremities. EXTREMITIES:  No peripheral edema.   SKIN:  Clear with no obvious rashes or skin changes. No nail dyscrasia. NEURO:  Nonfocal. Well oriented.  Appropriate affect. ECOG FS: 1 - Symptomatic but completely ambulatory  LABORATORY DATA:    RADIOGRAPHIC STUDIES:  Mammogram 09/12/2013 BIRADS category 2 mammogram.  DEXA scan 08/23/2013 normal BMD by WHO criteria. T score -0.9 in left femur neck  ASSESSMENT/PLAN:   Mary Dougherty is a 43 y.o. female with:   #1  Right breast invasive ductal carcinoma-Stage II , ER/PR positive HER-2/neu negative grade 2, s/p neoadjuvant chemo followed by Lumpectomy on 04/06/2013 showing Residual invasive cancer 1.1 cm, 1 LN+ SENTINEL and perineural invasion, grade 3 tumor.  #  2 completed Adjuvant Radiation therapy from 05/08/2013 to 06/22/2013  #3 The patient started Zoladex and Aromasin in June 2015 and is tolerating this well. She will continue the Aromasin x 5 years. She will receive her Zoladex injection today and monthly. This approach was taken after data become available of SOFT trial. SOFT is the largest trial to be conducted on ovarian suppression in premenopausal women with breast cancer. The trial was designed to assess the value of ovarian suppression in 3,047 young women with hormone receptor-positive breast cancer treated with 5 years of adjuvant therapy with tamoxifen or exemestane vs tamoxifen alone. Women randomly assigned to ovarian suppression in either arm had the choice of monthly injections of  triptorelin (Trelstar), surgical removal of the ovaries, or radiation. These treatments were compared in two different groups of women: those women who needed chemotherapy (younger age, higher-risk tumors, larger tumors, more likely to be node-positive) and those who didn't. Women who had chemotherapy entered the trial 8 months postchemotherapy while those who didn't entered the trial soon after surgery. At a median follow-up of 5.6 years, there was no overall benefit for the addition of ovarian suppression; 5-year event free survival was 84.5% with tamoxifen alone vs 86.6% for the combination of tamoxifen alone, which was not statistically significant.However, in the cohort that remained premenopausal after chemotherapy (average age, 79 years), ovarian suppression added to tamoxifen achieved a 22% reduction risk of recurrence vs tamoxifen alone. The combination of exemestane plus ovarian function suppression was even better, with a 35% risk reduction for recurrence vs tamoxifen alone. The 5-year event-free survival was 78% for tamoxifen alone, 82.5% for tamoxifen plus ovarian function suppression, and 85.7% for exemestane plus ovarian function suppression.  #4 History of chronic anemia likely due to hereditary spherocytosis. Her hemoglobin and hematocrit is stable.She is on  folic acid as well as prenatal vitamins. She has no evidence of hemolysis on her labs today. Elevated bilirubin is from that.  I have recommended that we continue to follow her labs monthly when she is here for her injection. Further work-up may be indicated at some point in the future, but her Hemoglobin has been stable. She has only required blood transfusions during her chemotherapy and prior to her breast surgery. She is asymptomatic today.  #5 There are few things we need to watch in patients with HS from a hematology perspective. Hemolytic crisis, with more pronounced jaundice due to accelerated hemolysis (may be precipitated by  infection). Aplastic crisis with dramatic fall in hemoglobin level and (reticulocyte count)-decompensation, usually due to maturation arrest and often associated with megaloblastic changes; may be precipitated by infection, such as influenza, notably with parvovirus B19. Folate deficiency caused by increased bone marrow requirement. Pigmented gallstones occur in approximately half of untreated patients. Increased hemolysis of red blood cells leads to increased bilirubin levels, because bilirubin is a breakdown product of heme. The high levels of bilirubin must be excreted into the bile by the liver, which may cause the formation of a pigmented gallstone, which is composed of calcium bilirubinate. Since these stones contain high levels of calcium carbonates and phosphate, they are radiopaque and are visible on x-ray. Leg ulcer. Abnormally low hemoglobin A1C level Hemoglobin A1C (glycated hemoglobin) is a test for determining the average blood glucose levels over an extended period of time, and is often used to evaluate glucose control in diabetics. The hemoglobin A1C levels are abnormally low because the life span of the red blood cells is decreased, providing less time for  the non-enzymatic glycosylation of hemoglobin. Thus, even with high overall blood sugar, the A1C will be lower than expected. Here is a picture from [place] explaining the pathophysiology of HS and peripheral smear findings.      #6 Treatment for HS: at this point, there exists no cure for the genetic defect that causes hereditary spherocytosis. Current management focuses on interventions that limit the severity of the disease. Treatment options include: Splenectomy: As in non-hereditary spherocytosis, acute symptoms of anemia and hyperbilirubinemia indicate treatment with blood transfusions or exchanges and chronic symptoms of anemia and splenomegaly indicate dietary supplementation of folic acid and splenectomy, the surgical removal of the  spleen. Splenectomy is indicated for moderate to severe cases, but not mild cases.To decrease the risk of sepsis, post-splenectomy spherocytosis patients require immunization against the pneumococcus bacterium, influenza virus, and prophylactic antibiotic treatment. However, the use of prophylactic antibiotics, such as penicillin, remains controversial. Cholecystectomy may be necessary if patient becomes symptomatic with gall stones.  #5. DEXA scan 08/2013 done, showed normal bone mineral density and we need to follow it again in 2 years. Continue calcium and Vitamin D.   Next F/u visit in one month for zoladex inj, cbc and cmp   All questions were answered. She knows  to contact us in the interim with any questions, concerns  I spent 45 minutes counseling the patient face to face.  The total time spent in the appointment was 45 minutes.  Bernadene Bell, MD Medical Hematologist/Oncologist [place] Pager: [phone] Office No: [phone] 09/20/2013, 5:16 PM

## 2013-09-21 ENCOUNTER — Telehealth: Payer: Self-pay | Admitting: Hematology and Oncology

## 2013-10-20 ENCOUNTER — Encounter: Payer: Self-pay | Admitting: Hematology and Oncology

## 2013-10-20 ENCOUNTER — Other Ambulatory Visit: Payer: Self-pay | Admitting: *Deleted

## 2013-10-20 DIAGNOSIS — C50511 Malignant neoplasm of lower-outer quadrant of right female breast: Secondary | ICD-10-CM

## 2013-10-23 ENCOUNTER — Encounter: Payer: Self-pay | Admitting: Hematology and Oncology

## 2013-10-23 ENCOUNTER — Encounter: Payer: Self-pay | Admitting: Gastroenterology

## 2013-10-23 ENCOUNTER — Ambulatory Visit (INDEPENDENT_AMBULATORY_CARE_PROVIDER_SITE_OTHER): Payer: BC Managed Care – PPO | Admitting: Gastroenterology

## 2013-10-23 ENCOUNTER — Ambulatory Visit (HOSPITAL_BASED_OUTPATIENT_CLINIC_OR_DEPARTMENT_OTHER): Payer: BC Managed Care – PPO

## 2013-10-23 ENCOUNTER — Ambulatory Visit (HOSPITAL_BASED_OUTPATIENT_CLINIC_OR_DEPARTMENT_OTHER): Payer: BC Managed Care – PPO | Admitting: Hematology and Oncology

## 2013-10-23 ENCOUNTER — Other Ambulatory Visit (HOSPITAL_BASED_OUTPATIENT_CLINIC_OR_DEPARTMENT_OTHER): Payer: BC Managed Care – PPO

## 2013-10-23 VITALS — BP 126/64 | HR 72 | Ht 65.0 in | Wt 206.0 lb

## 2013-10-23 VITALS — BP 132/62 | HR 75 | Temp 98.2°F | Resp 18 | Ht 65.0 in | Wt 203.5 lb

## 2013-10-23 DIAGNOSIS — Z170 Estrogen receptor positive status [ER+]: Secondary | ICD-10-CM

## 2013-10-23 DIAGNOSIS — R195 Other fecal abnormalities: Secondary | ICD-10-CM

## 2013-10-23 DIAGNOSIS — Z23 Encounter for immunization: Secondary | ICD-10-CM

## 2013-10-23 DIAGNOSIS — C50911 Malignant neoplasm of unspecified site of right female breast: Secondary | ICD-10-CM

## 2013-10-23 DIAGNOSIS — C50511 Malignant neoplasm of lower-outer quadrant of right female breast: Secondary | ICD-10-CM

## 2013-10-23 DIAGNOSIS — K921 Melena: Secondary | ICD-10-CM

## 2013-10-23 DIAGNOSIS — D580 Hereditary spherocytosis: Secondary | ICD-10-CM

## 2013-10-23 DIAGNOSIS — Z5111 Encounter for antineoplastic chemotherapy: Secondary | ICD-10-CM

## 2013-10-23 DIAGNOSIS — C50919 Malignant neoplasm of unspecified site of unspecified female breast: Secondary | ICD-10-CM

## 2013-10-23 DIAGNOSIS — D649 Anemia, unspecified: Secondary | ICD-10-CM

## 2013-10-23 LAB — CBC WITH DIFFERENTIAL/PLATELET
BASO%: 0.5 % (ref 0.0–2.0)
BASOS ABS: 0.1 10*3/uL (ref 0.0–0.1)
EOS%: 1.7 % (ref 0.0–7.0)
Eosinophils Absolute: 0.2 10*3/uL (ref 0.0–0.5)
HEMATOCRIT: 30.5 % — AB (ref 34.8–46.6)
HGB: 10.4 g/dL — ABNORMAL LOW (ref 11.6–15.9)
LYMPH#: 1.2 10*3/uL (ref 0.9–3.3)
LYMPH%: 11.5 % — ABNORMAL LOW (ref 14.0–49.7)
MCH: 28 pg (ref 25.1–34.0)
MCHC: 34.2 g/dL (ref 31.5–36.0)
MCV: 81.8 fL (ref 79.5–101.0)
MONO#: 0.5 10*3/uL (ref 0.1–0.9)
MONO%: 4.4 % (ref 0.0–14.0)
NEUT#: 8.4 10*3/uL — ABNORMAL HIGH (ref 1.5–6.5)
NEUT%: 81.9 % — AB (ref 38.4–76.8)
PLATELETS: 215 10*3/uL (ref 145–400)
RBC: 3.72 10*6/uL (ref 3.70–5.45)
RDW: 22.9 % — ABNORMAL HIGH (ref 11.2–14.5)
WBC: 10.3 10*3/uL (ref 3.9–10.3)

## 2013-10-23 LAB — COMPREHENSIVE METABOLIC PANEL (CC13)
ALT: 29 U/L (ref 0–55)
AST: 17 U/L (ref 5–34)
Albumin: 3.9 g/dL (ref 3.5–5.0)
Alkaline Phosphatase: 91 U/L (ref 40–150)
Anion Gap: 10 mEq/L (ref 3–11)
BUN: 9.5 mg/dL (ref 7.0–26.0)
CALCIUM: 9.6 mg/dL (ref 8.4–10.4)
CHLORIDE: 107 meq/L (ref 98–109)
CO2: 26 meq/L (ref 22–29)
Creatinine: 0.7 mg/dL (ref 0.6–1.1)
Glucose: 118 mg/dl (ref 70–140)
Potassium: 4 mEq/L (ref 3.5–5.1)
Sodium: 144 mEq/L (ref 136–145)
Total Bilirubin: 1.36 mg/dL — ABNORMAL HIGH (ref 0.20–1.20)
Total Protein: 7.3 g/dL (ref 6.4–8.3)

## 2013-10-23 LAB — LACTATE DEHYDROGENASE (CC13): LDH: 201 U/L (ref 125–245)

## 2013-10-23 LAB — HEMOGLOBIN A1C
Hgb A1c MFr Bld: 5 % (ref <5.7)
MEAN PLASMA GLUCOSE: 97 mg/dL (ref <117)

## 2013-10-23 MED ORDER — GOSERELIN ACETATE 3.6 MG ~~LOC~~ IMPL
3.6000 mg | DRUG_IMPLANT | Freq: Once | SUBCUTANEOUS | Status: AC
  Administered 2013-10-23: 3.6 mg via SUBCUTANEOUS
  Filled 2013-10-23: qty 3.6

## 2013-10-23 MED ORDER — INFLUENZA VAC SPLIT QUAD 0.5 ML IM SUSY
0.5000 mL | PREFILLED_SYRINGE | Freq: Once | INTRAMUSCULAR | Status: AC
  Administered 2013-10-23: 0.5 mL via INTRAMUSCULAR
  Filled 2013-10-23: qty 0.5

## 2013-10-23 MED ORDER — PEG-KCL-NACL-NASULF-NA ASC-C 100 G PO SOLR: 1.0000 | Freq: Once | ORAL | Status: DC

## 2013-10-23 NOTE — Assessment & Plan Note (Signed)
Right breast invasive ductal carcinoma ER/PR positive HER-2 negative stage II A. T3, N1, M0 with PTEN mutation  I recommended annual MRIs and mammograms for surveillance. Recent mammograms August 2015 were reviewed and they were normal. I discussed with her that PTEN mutations lead to increased risk of thyroid, breast, endometrial cancers. I encouraged her to do annual Pap smears. Today is that on exam did not reveal any lumps or nodules. We discussed the risks and benefits of bilateral mastectomies. We elected to do surveillance with breast exams and mammograms and MRIs instead.  Continue with hormonal therapy with Zoladex and exemestane.  discussed with her that duration of treatment with exemestane is 5 years and duration of treatment with Zoladex would be until her ovaries stop working. Patient is tolerating exemestane extremely well without any problems other than occasional hot flashes.  

## 2013-10-23 NOTE — Progress Notes (Signed)
Patient Care Team: Darlyne Russian, MD as PCP - General (Family Medicine)  DIAGNOSIS: Breast cancer of lower-outer quadrant of right female breast   Primary site: Breast (Right)   Staging method: AJCC 7th Edition   Pathologic: Stage IIA (T3, N1, cM0)   Summary: Stage IIA (T3, N1, cM0)   Pathologic comments: EP+PR+ Her2Neu-   SUMMARY OF ONCOLOGIC HISTORY:   Breast cancer of lower-outer quadrant of right female breast   09/08/2012 Initial Diagnosis Breast cancer of lower-outer quadrant of right female breast: ER 99% PR 100% HER-2 negative, Ki-67 70%   10/07/2012 - 03/17/2013 Neo-Adjuvant Chemotherapy Dose dense FEC followed by weekly Taxol   04/06/2013 Surgery Right breast lumpectomy residual 1.1 cm IDC grade 3 with high-grade DCIS, PNI +ve, 1/2 SLN positive, no MVI, T1 C. N1 A. stage IIA, ER 97% PR 95% HER-2 1.1 ratio negative   05/08/2013 - 06/22/2013 Radiation Therapy Radiation therapy to lumpectomy site   07/03/2013 -  Anti-estrogen oral therapy Zoladex plus Aromasin    Procedure Genetic testing:PTEN C.-(1088_1063)del 26 on [employer] testing    CHIEF COMPLIANT: Followup on antiestrogen therapy  INTERVAL HISTORY: Ms. Haertel is a 43 year old Caucasian lady with above-mentioned history of right breast cancer treated with neoadjuvant chemotherapy followed by surgery and radiation therapy. She has been on antiestrogen therapy since June 2015. She is currently on Zoladex plus Aromasin. By from the injection site discomfort from Zoladex she also has hot flashes. She was diagnosed with PTEN gene mutation. She also has history of chronic anemia related to hereditary spherocytosis. She was started on vitamin C supplementation and she is curious to know what her hemoglobin is.  REVIEW OF SYSTEMS:   Constitutional: Denies fevers, chills or abnormal weight loss Eyes: Denies blurriness of vision Ears, nose, mouth, throat, and face: Denies mucositis or sore throat Respiratory: Denies cough, dyspnea or  wheezes Cardiovascular: Denies palpitation, chest discomfort or lower extremity swelling Gastrointestinal:  Denies nausea, heartburn or change in bowel habits Skin: Denies abnormal skin rashes Lymphatics: Denies new lymphadenopathy or easy bruising Neurological:Denies numbness, tingling or new weaknesses Behavioral/Psych: Mood is stable, no new changes  Breast:  denies any pain or lumps or nodules in either breasts All other systems were reviewed with the patient and are negative.  I have reviewed the past medical history, past surgical history, social history and family history with the patient and they are unchanged from previous note.  ALLERGIES:  has No Known Allergies.  MEDICATIONS:  Current Outpatient Prescriptions  Medication Sig Dispense Refill  . Biotin 5 MG CAPS Take by mouth.      . Calcium Carb-Cholecalciferol (CALCIUM PLUS VITAMIN D3) 600-500 MG-UNIT CAPS Take 1 tablet by mouth 1 day or 1 dose.      Marland Kitchen exemestane (AROMASIN) 25 MG tablet Take 1 tablet (25 mg total) by mouth daily after breakfast.  90 tablet  2  . folic acid (FOLVITE) 403 MCG tablet Take 400 mcg by mouth daily.       . non-metallic deodorant Jethro Poling) MISC Apply 1 application topically daily.      . Prenatal Vit-Fe Fumarate-FA (PRENATAL MULTIVITAMIN) TABS tablet Take 1 tablet by mouth daily at 12 noon.       No current facility-administered medications for this visit.    PHYSICAL EXAMINATION: ECOG PERFORMANCE STATUS: 1 - Symptomatic but completely ambulatory  Filed Vitals:   10/23/13 0843  BP: 132/62  Pulse: 75  Temp: 98.2 F (36.8 C)  Resp: 18   Filed Weights  10/23/13 0843  Weight: 203 lb 8 oz (92.307 kg)    GENERAL:alert, no distress and comfortable SKIN: skin color, texture, turgor are normal, no rashes or significant lesions EYES: normal, Conjunctiva are pink and non-injected, sclera clear OROPHARYNX:no exudate, no erythema and lips, buccal mucosa, and tongue normal  NECK: supple, thyroid  normal size, non-tender, without nodularity LYMPH:  no palpable lymphadenopathy in the cervical, axillary or inguinal LUNGS: clear to auscultation and percussion with normal breathing effort HEART: regular rate & rhythm and no murmurs and no lower extremity edema ABDOMEN:abdomen soft, non-tender and normal bowel sounds Musculoskeletal:no cyanosis of digits and no clubbing  NEURO: alert & oriented x 3 with fluent speech, no focal motor/sensory deficits  LABORATORY DATA:  I have reviewed the data as listed   Chemistry      Component Value Date/Time   NA 144 10/23/2013 0824   NA 136 10/26/2012 0918   K 4.0 10/23/2013 0824   K 3.3* 10/26/2012 0918   CL 101 10/26/2012 0918   CO2 26 10/23/2013 0824   CO2 27 10/26/2012 0918   BUN 9.5 10/23/2013 0824   BUN 15 10/26/2012 0918   CREATININE 0.7 10/23/2013 0824   CREATININE 0.60 10/26/2012 0918   GLU 124* 03/10/2013 1438      Component Value Date/Time   CALCIUM 9.6 10/23/2013 0824   CALCIUM 8.4 10/26/2012 0918   ALKPHOS 91 10/23/2013 0824   ALKPHOS 118* 10/26/2012 0918   AST 17 10/23/2013 0824   AST 12 10/26/2012 0918   ALT 29 10/23/2013 0824   ALT 18 10/26/2012 0918   BILITOT 1.36* 10/23/2013 0824   BILITOT 0.6 10/26/2012 0918       Lab Results  Component Value Date   WBC 10.3 10/23/2013   HGB 10.4* 10/23/2013   HCT 30.5* 10/23/2013   MCV 81.8 10/23/2013   PLT 215 10/23/2013   NEUTROABS 8.4* 10/23/2013     RADIOGRAPHIC STUDIES: I have personally reviewed the radiology reports and agreed with their findings. No results found.   ASSESSMENT & PLAN:  Breast cancer of lower-outer quadrant of right female breast Right breast invasive ductal carcinoma ER/PR positive HER-2 negative stage II A. T3, N1, M0 with PTEN mutation  I recommended annual MRIs and mammograms for surveillance. Recent mammograms August 2015 were reviewed and they were normal. I discussed with her that PTEN mutations lead to increased risk of thyroid, breast, endometrial cancers. I  encouraged her to do annual Pap smears. Today is that on exam did not reveal any lumps or nodules. We discussed the risks and benefits of bilateral mastectomies. We elected to do surveillance with breast exams and mammograms and MRIs instead.  Continue with hormonal therapy with Zoladex and exemestane.  discussed with her that duration of treatment with exemestane is 5 years and duration of treatment with Zoladex would be until her ovaries stop working. Patient is tolerating exemestane extremely well without any problems other than occasional hot flashes.   Anemia, unspecified Anemia secondary to complex malabsorption issues versus secondary to her hereditary spherocytosis. Today's hemoglobin is 10.4 is much improved from last time. She is now taking vitamin C in addition which seems to be helping her. We will recheck her counts in 3 months when she returns.   Orders Placed This Encounter  Procedures  . CBC with Differential    Standing Status: Future     Number of Occurrences:      Standing Expiration Date: 10/23/2014  . Comprehensive metabolic panel (Cmet) -  [facility]    Standing Status: Future     Number of Occurrences:      Standing Expiration Date: 10/23/2014   The patient has a good understanding of the overall plan. she agrees with it. She will call with any problems that may develop before her next visit here.  I spent 25 minutes counseling the patient face to face. The total time spent in the appointment was 30 minutes and more than 50% was on counseling and review of test results    Rulon Eisenmenger, MD 10/23/2013 11:18 AM

## 2013-10-23 NOTE — Patient Instructions (Signed)
You have been scheduled for a colonoscopy. Please follow written instructions given to you at your visit today.  Please pick up your prep kit at the pharmacy within the next 1-3 days. If you use inhalers (even only as needed), please bring them with you on the day of your procedure. Your physician has requested that you go to www.startemmi.com and enter the access code given to you at your visit today. This web site gives a general overview about your procedure. However, you should still follow specific instructions given to you by our office regarding your preparation for the procedure.  Thank you for choosing me and [facility].  Malcolm T. Stark, Jr., MD., FACG  

## 2013-10-23 NOTE — Assessment & Plan Note (Signed)
Anemia secondary to complex malabsorption issues versus secondary to her hereditary spherocytosis. Today's hemoglobin is 10.4 is much improved from last time. She is now taking vitamin C in addition which seems to be helping her. We will recheck her counts in 3 months when she returns.

## 2013-10-23 NOTE — Progress Notes (Signed)
    History of Present Illness: This is a 43 year old female with intermittent hematochezia, constipation and Hemoccult-positive stool. She evaluation problems last year but deferred proceeding with colonoscopy due 2 need for chemotherapy for breast cancer. She's completed her chemotherapy treatments and is interested in proceeding with colonoscopy. She states she sees small amounts of bright red blood per rectum, generally with constipated stools, about twice a week.  Review of Systems: Pertinent positive and negative review of systems were noted in the above HPI section. All other review of systems were otherwise negative.  Current Medications, Allergies, Past Medical History, Past Surgical History, Family History and Social History were reviewed in [employer] record.  Physical Exam: General: Well developed , well nourished, no acute distress Head: Normocephalic and atraumatic Eyes:  sclerae anicteric, EOMI Ears: Normal auditory acuity Mouth: No deformity or lesions Neck: Supple, no masses or thyromegaly Lungs: Clear throughout to auscultation Heart: Regular rate and rhythm; no murmurs, rubs or bruits Abdomen: Soft, non tender and non distended. No masses, hepatosplenomegaly or hernias noted. Normal Bowel sounds Rectal: deferred to colonoscopy Musculoskeletal: Symmetrical with no gross deformities  Skin: No lesions on visible extremities Pulses:  Normal pulses noted Extremities: No clubbing, cyanosis, edema or deformities noted Neurological: Alert oriented x 4, grossly nonfocal Cervical Nodes:  No significant cervical adenopathy Inguinal Nodes: No significant inguinal adenopathy Psychological:  Alert and cooperative. Normal mood and affect  Assessment and Recommendations:  1. Hematochezia, Hemoccult positive stool, constipation. Suspected hemorrhoidal bleeding. Rule out colorectal neoplasms. Increase dietary fiber and water intake. Begin MiraLax once or twice  daily as needed. Continue Colace twice a day and Preparation H suppositories. The risks, benefits, and alternatives to colonoscopy with possible biopsy and possible polypectomy were discussed with the patient and they consent to proceed.   2. Right breast cancer, completed chemotherapy.   3. Spherocytosis and anemia. The anemia is out of proportion to the amount of gastrointestinal bleeding described. Spherocytosis likely contributing to her anemia. Further followup with hematology/oncology.

## 2013-10-24 ENCOUNTER — Telehealth: Payer: Self-pay | Admitting: Hematology and Oncology

## 2013-10-24 ENCOUNTER — Encounter: Payer: Self-pay | Admitting: Gastroenterology

## 2013-10-24 NOTE — Telephone Encounter (Signed)
, °

## 2013-10-25 ENCOUNTER — Telehealth: Payer: Self-pay

## 2013-10-25 ENCOUNTER — Encounter: Payer: Self-pay | Admitting: Hematology and Oncology

## 2013-10-25 NOTE — Telephone Encounter (Signed)
lvm that her A1c was 5.0.

## 2013-10-26 ENCOUNTER — Telehealth: Payer: Self-pay | Admitting: Hematology and Oncology

## 2013-10-26 NOTE — Telephone Encounter (Signed)
, °

## 2013-10-30 ENCOUNTER — Ambulatory Visit (INDEPENDENT_AMBULATORY_CARE_PROVIDER_SITE_OTHER): Payer: BC Managed Care – PPO | Admitting: General Surgery

## 2013-11-02 ENCOUNTER — Other Ambulatory Visit: Payer: Self-pay | Admitting: Hematology and Oncology

## 2013-11-09 ENCOUNTER — Telehealth: Payer: Self-pay | Admitting: Gastroenterology

## 2013-11-09 NOTE — Telephone Encounter (Signed)
Left message for patient to call back. We did send in rx to pharmacy when she was here on 10/23/13.  Did she want it sent to a different pharmacy than [facility]?

## 2013-11-09 NOTE — Telephone Encounter (Signed)
I contacted pharmacy. The rx was on hold because patient never picked up prescription as instructed. I have asked that rx be filled. Patient advised.

## 2013-11-20 ENCOUNTER — Other Ambulatory Visit: Payer: Self-pay | Admitting: Hematology and Oncology

## 2013-11-20 ENCOUNTER — Ambulatory Visit (HOSPITAL_BASED_OUTPATIENT_CLINIC_OR_DEPARTMENT_OTHER): Payer: BC Managed Care – PPO

## 2013-11-20 VITALS — BP 127/66 | HR 79 | Temp 97.6°F

## 2013-11-20 DIAGNOSIS — C50911 Malignant neoplasm of unspecified site of right female breast: Secondary | ICD-10-CM

## 2013-11-20 DIAGNOSIS — C50919 Malignant neoplasm of unspecified site of unspecified female breast: Secondary | ICD-10-CM

## 2013-11-20 DIAGNOSIS — Z5111 Encounter for antineoplastic chemotherapy: Secondary | ICD-10-CM

## 2013-11-20 MED ORDER — GOSERELIN ACETATE 3.6 MG ~~LOC~~ IMPL
3.6000 mg | DRUG_IMPLANT | Freq: Once | SUBCUTANEOUS | Status: AC
  Administered 2013-11-20: 3.6 mg via SUBCUTANEOUS
  Filled 2013-11-20: qty 3.6

## 2013-11-20 NOTE — Patient Instructions (Signed)
Goserelin injection What is this medicine? GOSERELIN (GOE se rel in) is similar to a hormone found in the body. It lowers the amount of sex hormones that the body makes. Men will have lower testosterone levels and women will have lower estrogen levels while taking this medicine. In men, this medicine is used to treat prostate cancer; the injection is either given once per month or once every 12 weeks. A once per month injection (only) is used to treat women with endometriosis, dysfunctional uterine bleeding, or advanced breast cancer. This medicine may be used for other purposes; ask your health care provider or pharmacist if you have questions. COMMON BRAND NAME(S): Zoladex What should I tell my health care provider before I take this medicine? They need to know if you have any of these conditions (some only apply to women): -diabetes -heart disease or previous heart attack -high blood pressure -high cholesterol -kidney disease -osteoporosis or low bone density -problems passing urine -spinal cord injury -stroke -tobacco smoker -an unusual or allergic reaction to goserelin, hormone therapy, other medicines, foods, dyes, or preservatives -pregnant or trying to get pregnant -breast-feeding How should I use this medicine? This medicine is for injection under the skin. It is given by a health care professional in a hospital or clinic setting. Men receive this injection once every 4 weeks or once every 12 weeks. Women will only receive the once every 4 weeks injection. Talk to your pediatrician regarding the use of this medicine in children. Special care may be needed. Overdosage: If you think you have taken too much of this medicine contact a poison control center or emergency room at once. NOTE: This medicine is only for you. Do not share this medicine with others. What if I miss a dose? It is important not to miss your dose. Call your doctor or health care professional if you are unable to  keep an appointment. What may interact with this medicine? -female hormones like estrogen -herbal or dietary supplements like black cohosh, chasteberry, or DHEA -female hormones like testosterone -prasterone This list may not describe all possible interactions. Give your health care provider a list of all the medicines, herbs, non-prescription drugs, or dietary supplements you use. Also tell them if you smoke, drink alcohol, or use illegal drugs. Some items may interact with your medicine. What should I watch for while using this medicine? Visit your doctor or health care professional for regular checks on your progress. Your symptoms may appear to get worse during the first weeks of this therapy. Tell your doctor or healthcare professional if your symptoms do not start to get better or if they get worse after this time. Your bones may get weaker if you take this medicine for a long time. If you smoke or frequently drink alcohol you may increase your risk of bone loss. A family history of osteoporosis, chronic use of drugs for seizures (convulsions), or corticosteroids can also increase your risk of bone loss. Talk to your doctor about how to keep your bones strong. This medicine should stop regular monthly menstration in women. Tell your doctor if you continue to menstrate. Women should not become pregnant while taking this medicine or for 12 weeks after stopping this medicine. Women should inform their doctor if they wish to become pregnant or think they might be pregnant. There is a potential for serious side effects to an unborn child. Talk to your health care professional or pharmacist for more information. Do not breast-feed an infant while taking   this medicine. Men should inform their doctors if they wish to father a child. This medicine may lower sperm counts. Talk to your health care professional or pharmacist for more information. What side effects may I notice from receiving this  medicine? Side effects that you should report to your doctor or health care professional as soon as possible: -allergic reactions like skin rash, itching or hives, swelling of the face, lips, or tongue -bone pain -breathing problems -changes in vision -chest pain -feeling faint or lightheaded, falls -fever, chills -pain, swelling, warmth in the leg -pain, tingling, numbness in the hands or feet -signs and symptoms of low blood pressure like dizziness; feeling faint or lightheaded, falls; unusually weak or tired -stomach pain -swelling of the ankles, feet, hands -trouble passing urine or change in the amount of urine -unusually high or low blood pressure -unusually weak or tired Side effects that usually do not require medical attention (report to your doctor or health care professional if they continue or are bothersome): -change in sex drive or performance -changes in breast size in both males and females -changes in emotions or moods -headache -hot flashes -irritation at site where injected -loss of appetite -skin problems like acne, dry skin -vaginal dryness This list may not describe all possible side effects. Call your doctor for medical advice about side effects. You may report side effects to FDA at 1-800-FDA-1088. Where should I keep my medicine? This drug is given in a hospital or clinic and will not be stored at home. NOTE: This sheet is a summary. It may not cover all possible information. If you have questions about this medicine, talk to your doctor, pharmacist, or health care provider.  2015, [employer]. (2013-03-28 11:10:35)  

## 2013-11-21 ENCOUNTER — Ambulatory Visit (AMBULATORY_SURGERY_CENTER): Payer: BC Managed Care – PPO | Admitting: Gastroenterology

## 2013-11-21 ENCOUNTER — Encounter: Payer: Self-pay | Admitting: Gastroenterology

## 2013-11-21 VITALS — BP 143/61 | HR 75 | Temp 96.9°F | Resp 12 | Ht 65.0 in | Wt 206.0 lb

## 2013-11-21 DIAGNOSIS — R195 Other fecal abnormalities: Secondary | ICD-10-CM

## 2013-11-21 DIAGNOSIS — K921 Melena: Secondary | ICD-10-CM

## 2013-11-21 MED ORDER — SODIUM CHLORIDE 0.9 % IV SOLN: 500.0000 mL | INTRAVENOUS | Status: DC

## 2013-11-21 NOTE — Op Note (Addendum)
[place]  [address] [place], [id number]   COLONOSCOPY PROCEDURE REPORT  PATIENT: Mary, Dougherty  MR#: [id number] BIRTHDATE: October 27, 1970 , 43  yrs. old GENDER: female ENDOSCOPIST: Ladene Artist, MD, [facility] REFERRED YO:[id number] Everlene Farrier, M.D. PROCEDURE DATE:  11/21/2013 PROCEDURE:   Colonoscopy, diagnostic First Screening Colonoscopy - Avg.  risk and is 50 yrs.  old or older - No.  Prior Negative Screening - Now for repeat screening. N/A  History of Adenoma - Now for follow-up colonoscopy & has been > or = to 3 yrs.  N/A  Polyps Removed Today? No.  Polyps Removed Today? No.  Recommend repeat exam, <10 yrs? Polyps Removed Today? No.  Recommend repeat exam, <10 yrs? No. ASA CLASS:   Class II INDICATIONS:hematochezia and heme-positive stool. MEDICATIONS: Monitored anesthesia care and Propofol 300 mg IV DESCRIPTION OF PROCEDURE:   After the risks benefits and alternatives of the procedure were thoroughly explained, informed consent was obtained.  The digital rectal exam revealed no abnormalities of the rectum.   The [id number]  endoscope was introduced through the anus and advanced to the cecum, which was identified by both the appendix and ileocecal valve. No adverse events experienced.   The quality of the prep was excellent, using MoviPrep  The instrument was then slowly withdrawn as the colon was fully examined.    COLON FINDINGS: There was mild diverticulosis noted in the sigmoid colon.   The examination was otherwise normal.  Retroflexed views revealed internal Grade I hemorrhoids. The time to cecum=1 minutes 31 seconds.  Withdrawal time=8 minutes 31 seconds.  The scope was withdrawn and the procedure completed.  COMPLICATIONS: There were no immediate complications.  ENDOSCOPIC IMPRESSION: 1.   Mild diverticulosis in the sigmoid colon 2.   Grade I internal hemorrhoids  RECOMMENDATIONS: 1.  High fiber diet with liberal fluid intake. 2.   continue to follow colorectal cancer screening guidelines for "routine risk" patients with a repeat colonoscopy in 10 years. There is no need for routine, screening FOBT (stool) testing for at least 5 years.  eSigned:  Ladene Artist, MD, [facility] 11/21/2013 3:38 PM Revised: 11/21/2013 3:38 PM

## 2013-11-21 NOTE — Progress Notes (Signed)
Stable to RR 

## 2013-11-21 NOTE — Patient Instructions (Signed)
YOU HAD AN ENDOSCOPIC PROCEDURE TODAY AT THE [facility]: Refer to the procedure report that was given to you for any specific questions about what was found during the examination.  If the procedure report does not answer your questions, please call your gastroenterologist to clarify.  If you requested that your care partner not be given the details of your procedure findings, then the procedure report has been included in a sealed envelope for you to review at your convenience later.  YOU SHOULD EXPECT: Some feelings of bloating in the abdomen. Passage of more gas than usual.  Walking can help get rid of the air that was put into your GI tract during the procedure and reduce the bloating. If you had a lower endoscopy (such as a colonoscopy or flexible sigmoidoscopy) you may notice spotting of blood in your stool or on the toilet paper. If you underwent a bowel prep for your procedure, then you may not have a normal bowel movement for a few days.  DIET: Your first meal following the procedure should be a light meal and then it is ok to progress to your normal diet.  A half-sandwich or bowl of soup is an example of a good first meal.  Heavy or fried foods are harder to digest and may make you feel nauseous or bloated.  Likewise meals heavy in dairy and vegetables can cause extra gas to form and this can also increase the bloating.  Drink plenty of fluids but you should avoid alcoholic beverages for 24 hours.  ACTIVITY: Your care partner should take you home directly after the procedure.  You should plan to take it easy, moving slowly for the rest of the day.  You can resume normal activity the day after the procedure however you should NOT DRIVE or use heavy machinery for 24 hours (because of the sedation medicines used during the test).    SYMPTOMS TO REPORT IMMEDIATELY: A gastroenterologist can be reached at any hour.  During normal business hours, 8:30 AM to 5:00 PM Monday through Friday,  call [phone].  After hours and on weekends, please call the GI answering service at [phone] who will take a message and have the physician on call contact you.   Following lower endoscopy (colonoscopy or flexible sigmoidoscopy):  Excessive amounts of blood in the stool  Significant tenderness or worsening of abdominal pains  Swelling of the abdomen that is new, acute  Fever of 100F or higher  FOLLOW UP: If any biopsies were taken you will be contacted by phone or by letter within the next 1-3 weeks.  Call your gastroenterologist if you have not heard about the biopsies in 3 weeks.  Our staff will call the home number listed on your records the next business day following your procedure to check on you and address any questions or concerns that you may have at that time regarding the information given to you following your procedure. This is a courtesy call and so if there is no answer at the home number and we have not heard from you through the emergency physician on call, we will assume that you have returned to your regular daily activities without incident.  SIGNATURES/CONFIDENTIALITY: You and/or your care partner have signed paperwork which will be entered into your electronic medical record.  These signatures attest to the fact that that the information above on your After Visit Summary has been reviewed and is understood.  Full responsibility of the confidentiality of this   discharge information lies with you and/or your care-partner.  Diverticulosis, high fiber diet, hemorrhoids-handouts given  Repeat colonoscopy in 10 years-2025.

## 2013-11-22 ENCOUNTER — Telehealth: Payer: Self-pay | Admitting: *Deleted

## 2013-11-22 NOTE — Telephone Encounter (Signed)
  Follow up Call-  Call back number 11/21/2013  Post procedure Call Back phone  # [phone]  Permission to leave phone message Yes    [facility]

## 2013-11-23 NOTE — Telephone Encounter (Signed)
error 

## 2013-12-04 ENCOUNTER — Encounter: Payer: Self-pay | Admitting: Gastroenterology

## 2013-12-21 ENCOUNTER — Ambulatory Visit (HOSPITAL_BASED_OUTPATIENT_CLINIC_OR_DEPARTMENT_OTHER): Payer: BC Managed Care – PPO

## 2013-12-21 ENCOUNTER — Other Ambulatory Visit: Payer: Self-pay | Admitting: Hematology and Oncology

## 2013-12-21 DIAGNOSIS — C50511 Malignant neoplasm of lower-outer quadrant of right female breast: Secondary | ICD-10-CM

## 2013-12-21 DIAGNOSIS — Z5111 Encounter for antineoplastic chemotherapy: Secondary | ICD-10-CM

## 2013-12-21 MED ORDER — GOSERELIN ACETATE 3.6 MG ~~LOC~~ IMPL
3.6000 mg | DRUG_IMPLANT | Freq: Once | SUBCUTANEOUS | Status: AC
  Administered 2013-12-21: 3.6 mg via SUBCUTANEOUS
  Filled 2013-12-21: qty 3.6

## 2013-12-21 NOTE — Patient Instructions (Signed)
Goserelin injection What is this medicine? GOSERELIN (GOE se rel in) is similar to a hormone found in the body. It lowers the amount of sex hormones that the body makes. Men will have lower testosterone levels and women will have lower estrogen levels while taking this medicine. In men, this medicine is used to treat prostate cancer; the injection is either given once per month or once every 12 weeks. A once per month injection (only) is used to treat women with endometriosis, dysfunctional uterine bleeding, or advanced breast cancer. This medicine may be used for other purposes; ask your health care provider or pharmacist if you have questions. COMMON BRAND NAME(S): Zoladex What should I tell my health care provider before I take this medicine? They need to know if you have any of these conditions (some only apply to women): -diabetes -heart disease or previous heart attack -high blood pressure -high cholesterol -kidney disease -osteoporosis or low bone density -problems passing urine -spinal cord injury -stroke -tobacco smoker -an unusual or allergic reaction to goserelin, hormone therapy, other medicines, foods, dyes, or preservatives -pregnant or trying to get pregnant -breast-feeding How should I use this medicine? This medicine is for injection under the skin. It is given by a health care professional in a hospital or clinic setting. Men receive this injection once every 4 weeks or once every 12 weeks. Women will only receive the once every 4 weeks injection. Talk to your pediatrician regarding the use of this medicine in children. Special care may be needed. Overdosage: If you think you have taken too much of this medicine contact a poison control center or emergency room at once. NOTE: This medicine is only for you. Do not share this medicine with others. What if I miss a dose? It is important not to miss your dose. Call your doctor or health care professional if you are unable to  keep an appointment. What may interact with this medicine? -female hormones like estrogen -herbal or dietary supplements like black cohosh, chasteberry, or DHEA -female hormones like testosterone -prasterone This list may not describe all possible interactions. Give your health care provider a list of all the medicines, herbs, non-prescription drugs, or dietary supplements you use. Also tell them if you smoke, drink alcohol, or use illegal drugs. Some items may interact with your medicine. What should I watch for while using this medicine? Visit your doctor or health care professional for regular checks on your progress. Your symptoms may appear to get worse during the first weeks of this therapy. Tell your doctor or healthcare professional if your symptoms do not start to get better or if they get worse after this time. Your bones may get weaker if you take this medicine for a long time. If you smoke or frequently drink alcohol you may increase your risk of bone loss. A family history of osteoporosis, chronic use of drugs for seizures (convulsions), or corticosteroids can also increase your risk of bone loss. Talk to your doctor about how to keep your bones strong. This medicine should stop regular monthly menstration in women. Tell your doctor if you continue to menstrate. Women should not become pregnant while taking this medicine or for 12 weeks after stopping this medicine. Women should inform their doctor if they wish to become pregnant or think they might be pregnant. There is a potential for serious side effects to an unborn child. Talk to your health care professional or pharmacist for more information. Do not breast-feed an infant while taking   this medicine. Men should inform their doctors if they wish to father a child. This medicine may lower sperm counts. Talk to your health care professional or pharmacist for more information. What side effects may I notice from receiving this  medicine? Side effects that you should report to your doctor or health care professional as soon as possible: -allergic reactions like skin rash, itching or hives, swelling of the face, lips, or tongue -bone pain -breathing problems -changes in vision -chest pain -feeling faint or lightheaded, falls -fever, chills -pain, swelling, warmth in the leg -pain, tingling, numbness in the hands or feet -signs and symptoms of low blood pressure like dizziness; feeling faint or lightheaded, falls; unusually weak or tired -stomach pain -swelling of the ankles, feet, hands -trouble passing urine or change in the amount of urine -unusually high or low blood pressure -unusually weak or tired Side effects that usually do not require medical attention (report to your doctor or health care professional if they continue or are bothersome): -change in sex drive or performance -changes in breast size in both males and females -changes in emotions or moods -headache -hot flashes -irritation at site where injected -loss of appetite -skin problems like acne, dry skin -vaginal dryness This list may not describe all possible side effects. Call your doctor for medical advice about side effects. You may report side effects to FDA at 1-800-FDA-1088. Where should I keep my medicine? This drug is given in a hospital or clinic and will not be stored at home. NOTE: This sheet is a summary. It may not cover all possible information. If you have questions about this medicine, talk to your doctor, pharmacist, or health care provider.  2015, [employer]. (2013-03-28 11:10:35)  

## 2013-12-25 ENCOUNTER — Telehealth: Payer: Self-pay | Admitting: *Deleted

## 2013-12-25 ENCOUNTER — Telehealth: Payer: Self-pay

## 2013-12-25 NOTE — Telephone Encounter (Addendum)
Pt called wondering if she would be able to get her Zoladex injection here monthly starting in January. Dr. Lindi Adie usually prescribes the injection for her. Pt had breast cancer; she now takes an anti-hormone pill daily and the anti-estrogen shot monthly because she is also at high risk for uterine cancer. I informed her she would need to see a provider first and establish with them. I told her it would be best for her to make an appointment to establish. She has no problem seeing a provider first to establish. I informed her we would discuss her situation and call her back to see if we would be able to give her the injection.  Pt usually receives injection from [facility], but with her job it would be easier to get the injection here because of the time she gets off work.   Able to leave VM [phone]

## 2013-12-25 NOTE — Telephone Encounter (Signed)
Patient being seen in office today and asking if Zoladex can be given by her PCP office starting in January? Before checking into reimbursement, they are asking for Dr. Geralyn Flash opinion. Patient was concerned about last injection it took an hour to receive the injection saying there was a problem with "orders". She feels that PCP office would be quicker and is more convenient for her. Will make MD aware of request. PA said you can send her an 'In Basket" message with his response.

## 2013-12-25 NOTE — Telephone Encounter (Signed)
Left message on machine to call back  

## 2013-12-25 NOTE — Telephone Encounter (Signed)
Spoke with the [name], triage in Dr. Geralyn Flash office.  Next dose is due 12/22, along with an appointment with Dr. Lindi Adie. Manuela Schwartz will make him aware of the patient's desire to have the injections here, and he can discuss it with the patient at their visit next month.  If Dr. Lindi Adie thinks it's reasonable to have the injections administered elsewhere, he or Manuela Schwartz can message me and I will discuss with our medical director.

## 2013-12-27 NOTE — Telephone Encounter (Signed)
Spoke to pt- advised her of Chelle's message. Pt will call office after her 12/22 appt.

## 2014-01-19 ENCOUNTER — Telehealth: Payer: Self-pay

## 2014-01-19 NOTE — Telephone Encounter (Signed)
Pt never read MyChart msg.  Printed and mailed to pt 12/18.

## 2014-01-23 ENCOUNTER — Telehealth: Payer: Self-pay | Admitting: *Deleted

## 2014-01-23 ENCOUNTER — Telehealth: Payer: Self-pay | Admitting: Hematology and Oncology

## 2014-01-23 ENCOUNTER — Ambulatory Visit (HOSPITAL_BASED_OUTPATIENT_CLINIC_OR_DEPARTMENT_OTHER): Payer: BC Managed Care – PPO | Admitting: Lab

## 2014-01-23 ENCOUNTER — Other Ambulatory Visit: Payer: Self-pay | Admitting: *Deleted

## 2014-01-23 ENCOUNTER — Ambulatory Visit (HOSPITAL_BASED_OUTPATIENT_CLINIC_OR_DEPARTMENT_OTHER): Payer: BC Managed Care – PPO | Admitting: Hematology and Oncology

## 2014-01-23 ENCOUNTER — Other Ambulatory Visit: Payer: BC Managed Care – PPO | Admitting: Lab

## 2014-01-23 ENCOUNTER — Ambulatory Visit (HOSPITAL_BASED_OUTPATIENT_CLINIC_OR_DEPARTMENT_OTHER): Payer: BC Managed Care – PPO

## 2014-01-23 ENCOUNTER — Ambulatory Visit: Payer: BC Managed Care – PPO | Admitting: Hematology and Oncology

## 2014-01-23 VITALS — BP 102/47 | HR 84 | Temp 98.4°F | Resp 20 | Ht 65.0 in | Wt 208.7 lb

## 2014-01-23 DIAGNOSIS — C50511 Malignant neoplasm of lower-outer quadrant of right female breast: Secondary | ICD-10-CM

## 2014-01-23 DIAGNOSIS — Z170 Estrogen receptor positive status [ER+]: Secondary | ICD-10-CM

## 2014-01-23 DIAGNOSIS — N951 Menopausal and female climacteric states: Secondary | ICD-10-CM

## 2014-01-23 DIAGNOSIS — Z5111 Encounter for antineoplastic chemotherapy: Secondary | ICD-10-CM

## 2014-01-23 DIAGNOSIS — D649 Anemia, unspecified: Secondary | ICD-10-CM

## 2014-01-23 LAB — COMPREHENSIVE METABOLIC PANEL (CC13)
ALK PHOS: 103 U/L (ref 40–150)
ALT: 30 U/L (ref 0–55)
AST: 18 U/L (ref 5–34)
Albumin: 3.9 g/dL (ref 3.5–5.0)
Anion Gap: 9 mEq/L (ref 3–11)
BUN: 9.9 mg/dL (ref 7.0–26.0)
CALCIUM: 9.4 mg/dL (ref 8.4–10.4)
CO2: 27 meq/L (ref 22–29)
Chloride: 105 mEq/L (ref 98–109)
Creatinine: 0.7 mg/dL (ref 0.6–1.1)
EGFR: >90 mL/min/{1.73_m2} (ref >90)
Glucose: 175 mg/dl — ABNORMAL HIGH (ref 70–140)
Potassium: 4.1 mEq/L (ref 3.5–5.1)
SODIUM: 141 meq/L (ref 136–145)
TOTAL PROTEIN: 7.2 g/dL (ref 6.4–8.3)
Total Bilirubin: 0.94 mg/dL (ref 0.20–1.20)

## 2014-01-23 LAB — CBC WITH DIFFERENTIAL/PLATELET
BASO%: 0.8 % (ref 0.0–2.0)
Basophils Absolute: 0.1 10*3/uL (ref 0.0–0.1)
EOS%: 2.2 % (ref 0.0–7.0)
Eosinophils Absolute: 0.3 10*3/uL (ref 0.0–0.5)
HCT: 32 % — ABNORMAL LOW (ref 34.8–46.6)
HGB: 10.9 g/dL — ABNORMAL LOW (ref 11.6–15.9)
LYMPH%: 14.6 % (ref 14.0–49.7)
MCH: 28.2 pg (ref 25.1–34.0)
MCHC: 34 g/dL (ref 31.5–36.0)
MCV: 83 fL (ref 79.5–101.0)
MONO#: 0.5 10*3/uL (ref 0.1–0.9)
MONO%: 4.4 % (ref 0.0–14.0)
NEUT#: 9.4 10*3/uL — ABNORMAL HIGH (ref 1.5–6.5)
NEUT%: 78 % — AB (ref 38.4–76.8)
PLATELETS: 230 10*3/uL (ref 145–400)
RBC: 3.86 10*6/uL (ref 3.70–5.45)
RDW: 22.8 % — ABNORMAL HIGH (ref 11.2–14.5)
WBC: 12.1 10*3/uL — ABNORMAL HIGH (ref 3.9–10.3)
lymph#: 1.8 10*3/uL (ref 0.9–3.3)

## 2014-01-23 MED ORDER — GOSERELIN ACETATE 3.6 MG ~~LOC~~ IMPL
3.6000 mg | DRUG_IMPLANT | Freq: Once | SUBCUTANEOUS | Status: AC
  Administered 2014-01-23: 3.6 mg via SUBCUTANEOUS
  Filled 2014-01-23: qty 3.6

## 2014-01-23 NOTE — Progress Notes (Signed)
Patient Care Team: Darlyne Russian, MD as PCP - General (Family Medicine) Rulon Eisenmenger, MD as Consulting Physician (Hematology and Oncology)  DIAGNOSIS: Breast cancer of lower-outer quadrant of right female breast   Staging form: Breast, AJCC 7th Edition     Clinical: No stage assigned - Unsigned     Pathologic: Stage IIA (T3, N1, cM0) - Unsigned       Staging comments: EP+PR+ Her2Neu-    SUMMARY OF ONCOLOGIC HISTORY:   Breast cancer of lower-outer quadrant of right female breast   09/08/2012 Initial Diagnosis Breast cancer of lower-outer quadrant of right female breast: ER 99% PR 100% HER-2 negative, Ki-67 70%   10/07/2012 - 03/17/2013 Neo-Adjuvant Chemotherapy Dose dense FEC followed by weekly Taxol   04/06/2013 Surgery Right breast lumpectomy residual 1.1 cm IDC grade 3 with high-grade DCIS, PNI +ve, 1/2 SLN positive, no MVI, T1 C. N1 A. stage IIA, ER 97% PR 95% HER-2 1.1 ratio negative   05/08/2013 - 06/22/2013 Radiation Therapy Radiation therapy to lumpectomy site   07/03/2013 -  Anti-estrogen oral therapy Zoladex plus Aromasin    Procedure Genetic testing:PTEN C.-(1088_1063)del 26 on [employer] testing    CHIEF COMPLIANT: Follow-up on Zoladex and exemestane  INTERVAL HISTORY: Mary Dougherty is a 43 year old lady with above-mentioned history of right-sided breast cancer treated with neoadjuvant chemotherapy followed by surgery. She had undergone radiation and is currently on antiestrogen therapy with Zoladex and Aromasin. She is tolerating the treatment fairly well with occasional hot flashes. She felt a small palpable nodularity in the upper quadrant of the right breast. She did not think it was there previously.  REVIEW OF SYSTEMS:   Constitutional: Denies fevers, chills or abnormal weight loss Eyes: Denies blurriness of vision Ears, nose, mouth, throat, and face: Denies mucositis or sore throat Respiratory: Denies cough, dyspnea or wheezes Cardiovascular: Denies palpitation, chest  discomfort or lower extremity swelling Gastrointestinal:  Denies nausea, heartburn or change in bowel habits Skin: Denies abnormal skin rashes Lymphatics: Denies new lymphadenopathy or easy bruising Neurological:Denies numbness, tingling or new weaknesses Behavioral/Psych: Mood is stable, no new changes  Breast: Right breast palpable nodule All other systems were reviewed with the patient and are negative.  I have reviewed the past medical history, past surgical history, social history and family history with the patient and they are unchanged from previous note.  ALLERGIES:  has No Known Allergies.  MEDICATIONS:  Current Outpatient Prescriptions  Medication Sig Dispense Refill  . Biotin 5 MG CAPS Take by mouth.    . Calcium Carb-Cholecalciferol (CALCIUM PLUS VITAMIN D3) 600-500 MG-UNIT CAPS Take 1 tablet by mouth 1 day or 1 dose.    Marland Kitchen exemestane (AROMASIN) 25 MG tablet Take 1 tablet (25 mg total) by mouth daily after breakfast. 90 tablet 2  . folic acid (FOLVITE) 419 MCG tablet Take 400 mcg by mouth daily.     Marland Kitchen goserelin (ZOLADEX) 3.6 MG injection Inject 3.6 mg into the skin every 28 (twenty-eight) days.    . vitamin C (ASCORBIC ACID) 500 MG tablet Take 500 mg by mouth daily.     No current facility-administered medications for this visit.    PHYSICAL EXAMINATION: ECOG PERFORMANCE STATUS: 0 - Asymptomatic  Filed Vitals:   01/23/14 0857  BP: 102/47  Pulse: 84  Temp: 98.4 F (36.9 C)  Resp: 20   Filed Weights   01/23/14 0857  Weight: 208 lb 11.2 oz (94.666 kg)    GENERAL:alert, no distress and comfortable SKIN: skin color, texture,  turgor are normal, no rashes or significant lesions EYES: normal, Conjunctiva are pink and non-injected, sclera clear OROPHARYNX:no exudate, no erythema and lips, buccal mucosa, and tongue normal  NECK: supple, thyroid normal size, non-tender, without nodularity LYMPH:  no palpable lymphadenopathy in the cervical, axillary or inguinal LUNGS:  clear to auscultation and percussion with normal breathing effort HEART: regular rate & rhythm and no murmurs and no lower extremity edema ABDOMEN:abdomen soft, non-tender and normal bowel sounds Musculoskeletal:no cyanosis of digits and no clubbing  NEURO: alert & oriented x 3 with fluent speech, no focal motor/sensory deficits BREAST:Right breast nodular thickening on the upper part of the breast which could be the normal breast architecture versus underlying nodule.Marland Kitchen No palpable axillary supraclavicular or infraclavicular adenopathy no breast tenderness or nipple discharge.   LABORATORY DATA:  I have reviewed the data as listed   Chemistry      Component Value Date/Time   NA 141 01/23/2014 0843   NA 136 10/26/2012 0918   K 4.1 01/23/2014 0843   K 3.3* 10/26/2012 0918   CL 101 10/26/2012 0918   CO2 27 01/23/2014 0843   CO2 27 10/26/2012 0918   BUN 9.9 01/23/2014 0843   BUN 15 10/26/2012 0918   CREATININE 0.7 01/23/2014 0843   CREATININE 0.60 10/26/2012 0918   GLU 124* 03/10/2013 1438      Component Value Date/Time   CALCIUM 9.4 01/23/2014 0843   CALCIUM 8.4 10/26/2012 0918   ALKPHOS 103 01/23/2014 0843   ALKPHOS 118* 10/26/2012 0918   AST 18 01/23/2014 0843   AST 12 10/26/2012 0918   ALT 30 01/23/2014 0843   ALT 18 10/26/2012 0918   BILITOT 0.94 01/23/2014 0843   BILITOT 0.6 10/26/2012 0918       Lab Results  Component Value Date   WBC 12.1* 01/23/2014   HGB 10.9* 01/23/2014   HCT 32.0* 01/23/2014   MCV 83.0 01/23/2014   PLT 230 01/23/2014   NEUTROABS 9.4* 01/23/2014    ASSESSMENT & PLAN:  Breast cancer of lower-outer quadrant of right female breast Right breast invasive ductal carcinoma ER/PR positive HER-2 negative stage II A. T3, N1, M0 with PTEN mutation Right breast lump: I palpated this area it feels like a cordlike thickening which could be normal breast lobularity. In order to be certain, we will obtain a right breast diagnostic mammogram.  Current  treatment: Zoladex and exemestane started 07/03/2013, plan is for 5 years  Toxicities of therapy: 1. Injection site discomfort from Zoladex 2. Hot flashes requiring supplementation with Effexor and Neurontin  Surveillance for breast cancer: 1. Breast exam 01/21/2014 normal 2. Mammogram August 2015 normal 3. MRI January 2015 revealed changes in the right breast, most likely postsurgical in nature  Patient would like to [facility] at [facility] near [facility]. We will call them and see if they can do those injections.  Anemia due to malabsorption versus Spherocytosis: Monitoring hemoglobin levels. Hemoglobin is 10.9  Return to clinic in 1 month for follow-up. She will come monthly for Zoladex injections.   Orders Placed This Encounter  Procedures  . MM Digital Diagnostic Unilat R    Standing Status: Future     Number of Occurrences:      Standing Expiration Date: 01/23/2015    Order Specific Question:  Reason for Exam (SYMPTOM  OR DIAGNOSIS REQUIRED)    Answer:  Right palpable abnormality upper quadrant    Order Specific Question:  Is the patient pregnant?    Answer:  No    Order Specific Question:  Preferred imaging location?    Answer:  External     Comments:  [facility]   The patient has a good understanding of the overall plan. she agrees with it. She will call with any problems that may develop before her next visit here.   Rulon Eisenmenger, MD 01/23/2014 9:37 AM

## 2014-01-23 NOTE — Patient Instructions (Signed)
Goserelin injection What is this medicine? GOSERELIN (GOE se rel in) is similar to a hormone found in the body. It lowers the amount of sex hormones that the body makes. Men will have lower testosterone levels and women will have lower estrogen levels while taking this medicine. In men, this medicine is used to treat prostate cancer; the injection is either given once per month or once every 12 weeks. A once per month injection (only) is used to treat women with endometriosis, dysfunctional uterine bleeding, or advanced breast cancer. This medicine may be used for other purposes; ask your health care provider or pharmacist if you have questions. COMMON BRAND NAME(S): Zoladex What should I tell my health care provider before I take this medicine? They need to know if you have any of these conditions (some only apply to women): -diabetes -heart disease or previous heart attack -high blood pressure -high cholesterol -kidney disease -osteoporosis or low bone density -problems passing urine -spinal cord injury -stroke -tobacco smoker -an unusual or allergic reaction to goserelin, hormone therapy, other medicines, foods, dyes, or preservatives -pregnant or trying to get pregnant -breast-feeding How should I use this medicine? This medicine is for injection under the skin. It is given by a health care professional in a hospital or clinic setting. Men receive this injection once every 4 weeks or once every 12 weeks. Women will only receive the once every 4 weeks injection. Talk to your pediatrician regarding the use of this medicine in children. Special care may be needed. Overdosage: If you think you have taken too much of this medicine contact a poison control center or emergency room at once. NOTE: This medicine is only for you. Do not share this medicine with others. What if I miss a dose? It is important not to miss your dose. Call your doctor or health care professional if you are unable to  keep an appointment. What may interact with this medicine? -female hormones like estrogen -herbal or dietary supplements like black cohosh, chasteberry, or DHEA -female hormones like testosterone -prasterone This list may not describe all possible interactions. Give your health care provider a list of all the medicines, herbs, non-prescription drugs, or dietary supplements you use. Also tell them if you smoke, drink alcohol, or use illegal drugs. Some items may interact with your medicine. What should I watch for while using this medicine? Visit your doctor or health care professional for regular checks on your progress. Your symptoms may appear to get worse during the first weeks of this therapy. Tell your doctor or healthcare professional if your symptoms do not start to get better or if they get worse after this time. Your bones may get weaker if you take this medicine for a long time. If you smoke or frequently drink alcohol you may increase your risk of bone loss. A family history of osteoporosis, chronic use of drugs for seizures (convulsions), or corticosteroids can also increase your risk of bone loss. Talk to your doctor about how to keep your bones strong. This medicine should stop regular monthly menstration in women. Tell your doctor if you continue to menstrate. Women should not become pregnant while taking this medicine or for 12 weeks after stopping this medicine. Women should inform their doctor if they wish to become pregnant or think they might be pregnant. There is a potential for serious side effects to an unborn child. Talk to your health care professional or pharmacist for more information. Do not breast-feed an infant while taking   this medicine. Men should inform their doctors if they wish to father a child. This medicine may lower sperm counts. Talk to your health care professional or pharmacist for more information. What side effects may I notice from receiving this  medicine? Side effects that you should report to your doctor or health care professional as soon as possible: -allergic reactions like skin rash, itching or hives, swelling of the face, lips, or tongue -bone pain -breathing problems -changes in vision -chest pain -feeling faint or lightheaded, falls -fever, chills -pain, swelling, warmth in the leg -pain, tingling, numbness in the hands or feet -signs and symptoms of low blood pressure like dizziness; feeling faint or lightheaded, falls; unusually weak or tired -stomach pain -swelling of the ankles, feet, hands -trouble passing urine or change in the amount of urine -unusually high or low blood pressure -unusually weak or tired Side effects that usually do not require medical attention (report to your doctor or health care professional if they continue or are bothersome): -change in sex drive or performance -changes in breast size in both males and females -changes in emotions or moods -headache -hot flashes -irritation at site where injected -loss of appetite -skin problems like acne, dry skin -vaginal dryness This list may not describe all possible side effects. Call your doctor for medical advice about side effects. You may report side effects to FDA at 1-800-FDA-1088. Where should I keep my medicine? This drug is given in a hospital or clinic and will not be stored at home. NOTE: This sheet is a summary. It may not cover all possible information. If you have questions about this medicine, talk to your doctor, pharmacist, or health care provider.  2015, [employer]. (2013-03-28 11:10:35)  

## 2014-01-23 NOTE — Telephone Encounter (Signed)
per pof to sch pt appt-gave pt copy of sch °

## 2014-01-23 NOTE — Telephone Encounter (Signed)
Left VMM for patient that [facility] did not carry the Zoladex injection.

## 2014-01-23 NOTE — Assessment & Plan Note (Addendum)
Right breast invasive ductal carcinoma ER/PR positive HER-2 negative stage II A. T3, N1, M0 with PTEN mutation Right breast lump: I palpated this area it feels like a cordlike thickening which could be normal breast lobularity. In order to be certain, we will obtain a right breast diagnostic mammogram.  Current treatment: Zoladex and exemestane started 07/03/2013, plan is for 5 years  Toxicities of therapy: 1. Injection site discomfort from Zoladex 2. Hot flashes requiring supplementation with Effexor and Neurontin  Surveillance for breast cancer: 1. Breast exam 01/21/2014 normal 2. Mammogram August 2015 normal 3. MRI January 2015 revealed changes in the right breast, most likely postsurgical in nature  Patient would like to receive Zoladex injections at [facility] near [facility]. We will call them and see if they can do those injections.  Anemia due to malabsorption versus Spherocytosis: Monitoring hemoglobin levels. Hemoglobin is 10.9  Return to clinic in 1 month for follow-up. She will come monthly for Zoladex injections.

## 2014-01-23 NOTE — Telephone Encounter (Signed)
Spoke with Pamala Hurry at [facility] to see if patient would be able to get Zoladex injection but she told me that they do not carry it.

## 2014-02-14 ENCOUNTER — Telehealth: Payer: Self-pay

## 2014-02-14 NOTE — Telephone Encounter (Signed)
Mammogram and ultrasound results rcvd from [facility] 01/31/14 Dr. Marcelo Baldy.  Copy to Dr. Lindi Adie.  Original to scan.

## 2014-02-20 ENCOUNTER — Ambulatory Visit (HOSPITAL_BASED_OUTPATIENT_CLINIC_OR_DEPARTMENT_OTHER): Payer: BLUE CROSS/BLUE SHIELD

## 2014-02-20 ENCOUNTER — Ambulatory Visit (HOSPITAL_BASED_OUTPATIENT_CLINIC_OR_DEPARTMENT_OTHER): Payer: BLUE CROSS/BLUE SHIELD | Admitting: Hematology and Oncology

## 2014-02-20 ENCOUNTER — Telehealth: Payer: Self-pay | Admitting: Hematology and Oncology

## 2014-02-20 VITALS — BP 143/55 | HR 86 | Temp 98.1°F | Resp 18 | Ht 65.0 in | Wt 208.5 lb

## 2014-02-20 DIAGNOSIS — C50511 Malignant neoplasm of lower-outer quadrant of right female breast: Secondary | ICD-10-CM

## 2014-02-20 DIAGNOSIS — N951 Menopausal and female climacteric states: Secondary | ICD-10-CM

## 2014-02-20 DIAGNOSIS — Z5111 Encounter for antineoplastic chemotherapy: Secondary | ICD-10-CM

## 2014-02-20 DIAGNOSIS — Z170 Estrogen receptor positive status [ER+]: Secondary | ICD-10-CM

## 2014-02-20 DIAGNOSIS — D649 Anemia, unspecified: Secondary | ICD-10-CM

## 2014-02-20 MED ORDER — GOSERELIN ACETATE 3.6 MG ~~LOC~~ IMPL
3.6000 mg | DRUG_IMPLANT | Freq: Once | SUBCUTANEOUS | Status: AC
  Administered 2014-02-20: 3.6 mg via SUBCUTANEOUS
  Filled 2014-02-20: qty 3.6

## 2014-02-20 NOTE — Assessment & Plan Note (Addendum)
Right breast invasive ductal carcinoma ER/PR positive HER-2 negative stage II A. T3, N1, M0 with PTEN mutation Current treatment: Adjuvant Zoladex and exemestane started 07/03/2013, plan is for 5 years  Toxicities of therapy: 1. Injection site discomfort from Zoladex 2. Hot flashes requiring supplementation with Effexor and Neurontin 3. Injection site pain patient uses ice cube prior to injection but I instructed her to use EMLA cream  Surveillance for breast cancer: 1. Breast exam 01/21/2014 normal 2. Mammogram August 2015 normal 3. MRI January 2015 revealed changes in the right breast, most likely postsurgical in nature  Anemia due to malabsorption versus Spherocytosis: Monitoring hemoglobin levels. Hemoglobin is Patient palpated a cordlike thickening in the right breast which was further evaluated by diagnostic mammogram.  Return to clinic in 3 months for follow-up and monthly for Zoladex injections

## 2014-02-20 NOTE — Progress Notes (Signed)
Patient Care Team: Darlyne Russian, MD as PCP - General (Family Medicine) Rulon Eisenmenger, MD as Consulting Physician (Hematology and Oncology)  DIAGNOSIS: Breast cancer of lower-outer quadrant of right female breast   Staging form: Breast, AJCC 7th Edition     Clinical: No stage assigned - Unsigned     Pathologic: Stage IIA (T3, N1, cM0) - Unsigned       Staging comments: EP+PR+ Her2Neu-    SUMMARY OF ONCOLOGIC HISTORY:   Breast cancer of lower-outer quadrant of right female breast   09/08/2012 Initial Diagnosis Breast cancer of lower-outer quadrant of right female breast: ER 99% PR 100% HER-2 negative, Ki-67 70%   10/07/2012 - 03/17/2013 Neo-Adjuvant Chemotherapy Dose dense FEC followed by weekly Taxol   04/06/2013 Surgery Right breast lumpectomy residual 1.1 cm IDC grade 3 with high-grade DCIS, PNI +ve, 1/2 SLN positive, no MVI, T1 C. N1 A. stage IIA, ER 97% PR 95% HER-2 1.1 ratio negative   05/08/2013 - 06/22/2013 Radiation Therapy Radiation therapy to lumpectomy site   07/03/2013 -  Anti-estrogen oral therapy Zoladex plus Aromasin    Procedure Genetic testing:PTEN C.-(1088_1063)del 26 on [employer] testing    CHIEF COMPLIANT: Follow-up of recent diagnostic mammograms for a palpable cordlike thickening of the breast  INTERVAL HISTORY: Mary Dougherty is a 44 year old lady with above-mentioned history of right-sided breast cancer currently on adjuvant antiestrogen therapy with Zoladex and exemestane. She has hot flashes for which she is on Neurontin and Effexor. Injection site discomfort is usually relieved with an ice cube. She had a cordlike thickening of the breast which was evaluated with a diagnostic mammogram which came back normal.  REVIEW OF SYSTEMS:   Constitutional: Denies fevers, chills or abnormal weight loss Eyes: Denies blurriness of vision Ears, nose, mouth, throat, and face: Denies mucositis or sore throat Respiratory: Denies cough, dyspnea or wheezes Cardiovascular: Denies  palpitation, chest discomfort or lower extremity swelling Gastrointestinal:  Denies nausea, heartburn or change in bowel habits Skin: Denies abnormal skin rashes Lymphatics: Denies new lymphadenopathy or easy bruising Neurological:Denies numbness, tingling or new weaknesses Behavioral/Psych: Mood is stable, no new changes  Breast:  denies any pain or lumps or nodules in either breasts All other systems were reviewed with the patient and are negative.  I have reviewed the past medical history, past surgical history, social history and family history with the patient and they are unchanged from previous note.  ALLERGIES:  has No Known Allergies.  MEDICATIONS:  Current Outpatient Prescriptions  Medication Sig Dispense Refill  . Biotin 5 MG CAPS Take by mouth.    . Calcium Carb-Cholecalciferol (CALCIUM PLUS VITAMIN D3) 600-500 MG-UNIT CAPS Take 1 tablet by mouth 1 day or 1 dose.    Marland Kitchen exemestane (AROMASIN) 25 MG tablet Take 1 tablet (25 mg total) by mouth daily after breakfast. 90 tablet 2  . folic acid (FOLVITE) 326 MCG tablet Take 400 mcg by mouth daily.     Marland Kitchen goserelin (ZOLADEX) 3.6 MG injection Inject 3.6 mg into the skin every 28 (twenty-eight) days.    . vitamin C (ASCORBIC ACID) 500 MG tablet Take 500 mg by mouth daily.     No current facility-administered medications for this visit.    PHYSICAL EXAMINATION: ECOG PERFORMANCE STATUS: 0 - Asymptomatic  Filed Vitals:   02/20/14 0853  BP: 143/55  Pulse: 86  Temp: 98.1 F (36.7 C)  Resp: 18   Filed Weights   02/20/14 0853  Weight: 208 lb 8 oz (94.575 kg)  GENERAL:alert, no distress and comfortable SKIN: skin color, texture, turgor are normal, no rashes or significant lesions EYES: normal, Conjunctiva are pink and non-injected, sclera clear OROPHARYNX:no exudate, no erythema and lips, buccal mucosa, and tongue normal  NECK: supple, thyroid normal size, non-tender, without nodularity LYMPH:  no palpable lymphadenopathy in  the cervical, axillary or inguinal LUNGS: clear to auscultation and percussion with normal breathing effort HEART: regular rate & rhythm and no murmurs and no lower extremity edema ABDOMEN:abdomen soft, non-tender and normal bowel sounds Musculoskeletal:no cyanosis of digits and no clubbing  NEURO: alert & oriented x 3 with fluent speech, no focal motor/sensory deficits  LABORATORY DATA:  I have reviewed the data as listed   Chemistry      Component Value Date/Time   NA 141 01/23/2014 0843   NA 136 10/26/2012 0918   K 4.1 01/23/2014 0843   K 3.3* 10/26/2012 0918   CL 101 10/26/2012 0918   CO2 27 01/23/2014 0843   CO2 27 10/26/2012 0918   BUN 9.9 01/23/2014 0843   BUN 15 10/26/2012 0918   CREATININE 0.7 01/23/2014 0843   CREATININE 0.60 10/26/2012 0918   GLU 124* 03/10/2013 1438      Component Value Date/Time   CALCIUM 9.4 01/23/2014 0843   CALCIUM 8.4 10/26/2012 0918   ALKPHOS 103 01/23/2014 0843   ALKPHOS 118* 10/26/2012 0918   AST 18 01/23/2014 0843   AST 12 10/26/2012 0918   ALT 30 01/23/2014 0843   ALT 18 10/26/2012 0918   BILITOT 0.94 01/23/2014 0843   BILITOT 0.6 10/26/2012 0918       Lab Results  Component Value Date   WBC 12.1* 01/23/2014   HGB 10.9* 01/23/2014   HCT 32.0* 01/23/2014   MCV 83.0 01/23/2014   PLT 230 01/23/2014   NEUTROABS 9.4* 01/23/2014     RADIOGRAPHIC STUDIES: I have personally reviewed the radiology reports and agreed with their findings. Repeat diagnostic mammogram December 2015 was normal  ASSESSMENT & PLAN:  Breast cancer of lower-outer quadrant of right female breast Right breast invasive ductal carcinoma ER/PR positive HER-2 negative stage II A. T3, N1, M0 with PTEN mutation Current treatment: Adjuvant Zoladex and exemestane started 07/03/2013, plan is for 5 years  Toxicities of therapy: 1. Hot flashes requiring supplementation with Effexor and Neurontin 2. Injection site pain patient uses ice cube prior to injection but I  instructed her to use EMLA cream  Surveillance for breast cancer: 1. Breast exam 01/21/2014 normal 2. Mammogram August 2015 normal 3. MRI January 2015 revealed changes in the right breast, most likely postsurgical in nature 4. Bone density done July 2015 was normal next bone density to be done July 2017  Anemia due to malabsorption versus Spherocytosis: Monitoring hemoglobin levels. Hemoglobin is Patient palpated a cordlike thickening in the right breast which was further evaluated by diagnostic mammogram, which came back as normal  Return to clinic in 3 months for follow-up and monthly for Zoladex injections  Orders Placed This Encounter  Procedures  . CBC with Differential    Standing Status: Future     Number of Occurrences:      Standing Expiration Date: 02/20/2015  . Comprehensive metabolic panel (Cmet) - [facility]    Standing Status: Future     Number of Occurrences:      Standing Expiration Date: 02/20/2015   The patient has a good understanding of the overall plan. she agrees with it. She will call with any problems that may develop before her  next visit here.   Rulon Eisenmenger, MD

## 2014-02-20 NOTE — Telephone Encounter (Signed)
, °

## 2014-03-23 ENCOUNTER — Ambulatory Visit (HOSPITAL_BASED_OUTPATIENT_CLINIC_OR_DEPARTMENT_OTHER): Payer: BLUE CROSS/BLUE SHIELD

## 2014-03-23 DIAGNOSIS — Z5111 Encounter for antineoplastic chemotherapy: Secondary | ICD-10-CM

## 2014-03-23 DIAGNOSIS — C50511 Malignant neoplasm of lower-outer quadrant of right female breast: Secondary | ICD-10-CM

## 2014-03-23 MED ORDER — GOSERELIN ACETATE 3.6 MG ~~LOC~~ IMPL
3.6000 mg | DRUG_IMPLANT | Freq: Once | SUBCUTANEOUS | Status: AC
  Administered 2014-03-23: 3.6 mg via SUBCUTANEOUS
  Filled 2014-03-23: qty 3.6

## 2014-04-20 ENCOUNTER — Ambulatory Visit (HOSPITAL_BASED_OUTPATIENT_CLINIC_OR_DEPARTMENT_OTHER): Payer: BLUE CROSS/BLUE SHIELD

## 2014-04-20 DIAGNOSIS — Z5111 Encounter for antineoplastic chemotherapy: Secondary | ICD-10-CM

## 2014-04-20 DIAGNOSIS — C50511 Malignant neoplasm of lower-outer quadrant of right female breast: Secondary | ICD-10-CM

## 2014-04-20 MED ORDER — GOSERELIN ACETATE 3.6 MG ~~LOC~~ IMPL
3.6000 mg | DRUG_IMPLANT | Freq: Once | SUBCUTANEOUS | Status: AC
  Administered 2014-04-20: 3.6 mg via SUBCUTANEOUS
  Filled 2014-04-20: qty 3.6

## 2014-04-20 NOTE — Patient Instructions (Signed)
Goserelin injection What is this medicine? GOSERELIN (GOE se rel in) is similar to a hormone found in the body. It lowers the amount of sex hormones that the body makes. Men will have lower testosterone levels and women will have lower estrogen levels while taking this medicine. In men, this medicine is used to treat prostate cancer; the injection is either given once per month or once every 12 weeks. A once per month injection (only) is used to treat women with endometriosis, dysfunctional uterine bleeding, or advanced breast cancer. This medicine may be used for other purposes; ask your health care provider or pharmacist if you have questions. COMMON BRAND NAME(S): Zoladex What should I tell my health care provider before I take this medicine? They need to know if you have any of these conditions (some only apply to women): -diabetes -heart disease or previous heart attack -high blood pressure -high cholesterol -kidney disease -osteoporosis or low bone density -problems passing urine -spinal cord injury -stroke -tobacco smoker -an unusual or allergic reaction to goserelin, hormone therapy, other medicines, foods, dyes, or preservatives -pregnant or trying to get pregnant -breast-feeding How should I use this medicine? This medicine is for injection under the skin. It is given by a health care professional in a hospital or clinic setting. Men receive this injection once every 4 weeks or once every 12 weeks. Women will only receive the once every 4 weeks injection. Talk to your pediatrician regarding the use of this medicine in children. Special care may be needed. Overdosage: If you think you have taken too much of this medicine contact a poison control center or emergency room at once. NOTE: This medicine is only for you. Do not share this medicine with others. What if I miss a dose? It is important not to miss your dose. Call your doctor or health care professional if you are unable to  keep an appointment. What may interact with this medicine? -female hormones like estrogen -herbal or dietary supplements like black cohosh, chasteberry, or DHEA -female hormones like testosterone -prasterone This list may not describe all possible interactions. Give your health care provider a list of all the medicines, herbs, non-prescription drugs, or dietary supplements you use. Also tell them if you smoke, drink alcohol, or use illegal drugs. Some items may interact with your medicine. What should I watch for while using this medicine? Visit your doctor or health care professional for regular checks on your progress. Your symptoms may appear to get worse during the first weeks of this therapy. Tell your doctor or healthcare professional if your symptoms do not start to get better or if they get worse after this time. Your bones may get weaker if you take this medicine for a long time. If you smoke or frequently drink alcohol you may increase your risk of bone loss. A family history of osteoporosis, chronic use of drugs for seizures (convulsions), or corticosteroids can also increase your risk of bone loss. Talk to your doctor about how to keep your bones strong. This medicine should stop regular monthly menstration in women. Tell your doctor if you continue to menstrate. Women should not become pregnant while taking this medicine or for 12 weeks after stopping this medicine. Women should inform their doctor if they wish to become pregnant or think they might be pregnant. There is a potential for serious side effects to an unborn child. Talk to your health care professional or pharmacist for more information. Do not breast-feed an infant while taking   this medicine. Men should inform their doctors if they wish to father a child. This medicine may lower sperm counts. Talk to your health care professional or pharmacist for more information. What side effects may I notice from receiving this  medicine? Side effects that you should report to your doctor or health care professional as soon as possible: -allergic reactions like skin rash, itching or hives, swelling of the face, lips, or tongue -bone pain -breathing problems -changes in vision -chest pain -feeling faint or lightheaded, falls -fever, chills -pain, swelling, warmth in the leg -pain, tingling, numbness in the hands or feet -signs and symptoms of low blood pressure like dizziness; feeling faint or lightheaded, falls; unusually weak or tired -stomach pain -swelling of the ankles, feet, hands -trouble passing urine or change in the amount of urine -unusually high or low blood pressure -unusually weak or tired Side effects that usually do not require medical attention (report to your doctor or health care professional if they continue or are bothersome): -change in sex drive or performance -changes in breast size in both males and females -changes in emotions or moods -headache -hot flashes -irritation at site where injected -loss of appetite -skin problems like acne, dry skin -vaginal dryness This list may not describe all possible side effects. Call your doctor for medical advice about side effects. You may report side effects to FDA at 1-800-FDA-1088. Where should I keep my medicine? This drug is given in a hospital or clinic and will not be stored at home. NOTE: This sheet is a summary. It may not cover all possible information. If you have questions about this medicine, talk to your doctor, pharmacist, or health care provider.  2015, [employer]. (2013-03-28 11:10:35)  

## 2014-05-21 ENCOUNTER — Telehealth: Payer: Self-pay | Admitting: Hematology and Oncology

## 2014-05-21 ENCOUNTER — Other Ambulatory Visit (HOSPITAL_BASED_OUTPATIENT_CLINIC_OR_DEPARTMENT_OTHER): Payer: BLUE CROSS/BLUE SHIELD

## 2014-05-21 ENCOUNTER — Ambulatory Visit (HOSPITAL_BASED_OUTPATIENT_CLINIC_OR_DEPARTMENT_OTHER): Payer: BLUE CROSS/BLUE SHIELD | Admitting: Hematology and Oncology

## 2014-05-21 ENCOUNTER — Ambulatory Visit (HOSPITAL_BASED_OUTPATIENT_CLINIC_OR_DEPARTMENT_OTHER): Payer: BLUE CROSS/BLUE SHIELD

## 2014-05-21 VITALS — BP 131/50 | HR 78 | Temp 97.7°F | Resp 18 | Ht 65.0 in | Wt 209.6 lb

## 2014-05-21 DIAGNOSIS — Z5111 Encounter for antineoplastic chemotherapy: Secondary | ICD-10-CM

## 2014-05-21 DIAGNOSIS — Z170 Estrogen receptor positive status [ER+]: Secondary | ICD-10-CM

## 2014-05-21 DIAGNOSIS — C50511 Malignant neoplasm of lower-outer quadrant of right female breast: Secondary | ICD-10-CM

## 2014-05-21 DIAGNOSIS — N951 Menopausal and female climacteric states: Secondary | ICD-10-CM

## 2014-05-21 DIAGNOSIS — D649 Anemia, unspecified: Secondary | ICD-10-CM

## 2014-05-21 LAB — CBC WITH DIFFERENTIAL/PLATELET
BASO%: 0.4 % (ref 0.0–2.0)
Basophils Absolute: 0 10*3/uL (ref 0.0–0.1)
EOS%: 2.5 % (ref 0.0–7.0)
Eosinophils Absolute: 0.2 10*3/uL (ref 0.0–0.5)
HCT: 30.6 % — ABNORMAL LOW (ref 34.8–46.6)
HEMOGLOBIN: 10.4 g/dL — AB (ref 11.6–15.9)
LYMPH%: 15.8 % (ref 14.0–49.7)
MCH: 27.6 pg (ref 25.1–34.0)
MCHC: 34.1 g/dL (ref 31.5–36.0)
MCV: 80.9 fL (ref 79.5–101.0)
MONO#: 0.5 10*3/uL (ref 0.1–0.9)
MONO%: 5.4 % (ref 0.0–14.0)
NEUT%: 75.9 % (ref 38.4–76.8)
NEUTROS ABS: 7.6 10*3/uL — AB (ref 1.5–6.5)
Platelets: 209 10*3/uL (ref 145–400)
RBC: 3.78 10*6/uL (ref 3.70–5.45)
RDW: 22.2 % — AB (ref 11.2–14.5)
WBC: 10 10*3/uL (ref 3.9–10.3)
lymph#: 1.6 10*3/uL (ref 0.9–3.3)

## 2014-05-21 LAB — COMPREHENSIVE METABOLIC PANEL (CC13)
ALT: 28 U/L (ref 0–55)
ANION GAP: 13 meq/L — AB (ref 3–11)
AST: 17 U/L (ref 5–34)
Albumin: 3.7 g/dL (ref 3.5–5.0)
Alkaline Phosphatase: 111 U/L (ref 40–150)
BUN: 8.3 mg/dL (ref 7.0–26.0)
CALCIUM: 9.2 mg/dL (ref 8.4–10.4)
CHLORIDE: 105 meq/L (ref 98–109)
CO2: 24 mEq/L (ref 22–29)
Creatinine: 0.7 mg/dL (ref 0.6–1.1)
Glucose: 192 mg/dl — ABNORMAL HIGH (ref 70–140)
POTASSIUM: 4.1 meq/L (ref 3.5–5.1)
Sodium: 142 mEq/L (ref 136–145)
Total Bilirubin: 0.93 mg/dL (ref 0.20–1.20)
Total Protein: 6.8 g/dL (ref 6.4–8.3)

## 2014-05-21 MED ORDER — GOSERELIN ACETATE 3.6 MG ~~LOC~~ IMPL
3.6000 mg | DRUG_IMPLANT | Freq: Once | SUBCUTANEOUS | Status: AC
  Administered 2014-05-21: 3.6 mg via SUBCUTANEOUS
  Filled 2014-05-21: qty 3.6

## 2014-05-21 NOTE — Assessment & Plan Note (Signed)
Right breast invasive ductal carcinoma ER/PR positive HER-2 negative stage II A. T3, N1, M0 with PTEN mutation Current treatment: Adjuvant Zoladex and exemestane started 07/03/2013, plan is for 5 years  Toxicities of therapy: 1. Hot flashes requiring supplementation with Effexor and Neurontin 2. Injection site pain patient uses ice cube prior to injection but I instructed her to use EMLA cream  Surveillance for breast cancer: 1. Breast exam 01/21/2014 normal 2. Mammogram August 2015 normal 3. MRI January 2015 revealed changes in the right breast, most likely postsurgical in nature 4. Bone density done July 2015 was normal next bone density to be done July 2017 Patient palpated a cordlike thickening in the right breast which was further evaluated by diagnostic mammogram, which came back as normal  Anemia due to malabsorption versus Spherocytosis: Monitoring hemoglobin levels.   Return to clinic in 6 months for follow-up and monthly for Zoladex injections

## 2014-05-21 NOTE — Telephone Encounter (Signed)
Spoke with patient and she is aware of her appointment °

## 2014-05-21 NOTE — Progress Notes (Signed)
Patient Care Team: Mary Russian, MD as PCP - General (Family Medicine) Nicholas Lose, MD as Consulting Physician (Hematology and Oncology)  DIAGNOSIS: Breast cancer of lower-outer quadrant of right female breast   Staging form: Breast, AJCC 7th Edition     Clinical: No stage assigned - Unsigned     Pathologic: Stage IIA (T3, N1, cM0) - Unsigned       Staging comments: EP+PR+ Her2Neu-    SUMMARY OF ONCOLOGIC HISTORY:   Breast cancer of lower-outer quadrant of right female breast   09/08/2012 Initial Diagnosis Breast cancer of lower-outer quadrant of right female breast: ER 99% PR 100% HER-2 negative, Ki-67 70%   10/07/2012 - 03/17/2013 Neo-Adjuvant Chemotherapy Dose dense FEC followed by weekly Taxol   04/06/2013 Surgery Right breast lumpectomy residual 1.1 cm IDC grade 3 with high-grade DCIS, PNI +ve, 1/2 SLN positive, no MVI, T1 C. N1 A. stage IIA, ER 97% PR 95% HER-2 1.1 ratio negative   05/08/2013 - 06/22/2013 Radiation Therapy Radiation therapy to lumpectomy site   07/03/2013 -  Anti-estrogen oral therapy Zoladex plus Aromasin    Procedure Genetic testing:PTEN C.-(1088_1063)del 26 on [employer] testing    CHIEF COMPLIANT: Follow-up on Zoladex and Aromasin  INTERVAL HISTORY: Mary Dougherty is a 44 year old with above-mentioned history of right-sided breast cancer treated with lumpectomy and radiation and is currently on hormone therapy with Zoladex plus Aromasin and tolerating Aromasin fairly well except for joint pains especially in the fingers when she does not do much activity.  REVIEW OF SYSTEMS:   Constitutional: Denies fevers, chills or abnormal weight loss Eyes: Denies blurriness of vision Ears, nose, mouth, throat, and face: Denies mucositis or sore throat Respiratory: Denies cough, dyspnea or wheezes Cardiovascular: Denies palpitation, chest discomfort or lower extremity swelling Gastrointestinal:  Denies nausea, heartburn or change in bowel habits Skin: Denies abnormal skin  rashes Lymphatics: Denies new lymphadenopathy or easy bruising Neurological:Denies numbness, tingling or new weaknesses Behavioral/Psych: Mood is stable, no new changes  Breast:  denies any pain or lumps or nodules in either breasts All other systems were reviewed with the patient and are negative.  I have reviewed the past medical history, past surgical history, social history and family history with the patient and they are unchanged from previous note.  ALLERGIES:  has No Known Allergies.  MEDICATIONS:  Current Outpatient Prescriptions  Medication Sig Dispense Refill  . Biotin 5 MG CAPS Take 10,000 mg by mouth every other day.     . Calcium Carb-Cholecalciferol (CALCIUM PLUS VITAMIN D3) 600-500 MG-UNIT CAPS Take 1 tablet by mouth every other day.     . exemestane (AROMASIN) 25 MG tablet Take 1 tablet (25 mg total) by mouth daily after breakfast. 90 tablet 2  . folic acid (FOLVITE) 283 MCG tablet Take 400 mcg by mouth every other day.     Marland Kitchen goserelin (ZOLADEX) 3.6 MG injection Inject 3.6 mg into the skin every 28 (twenty-eight) days.    . vitamin C (ASCORBIC ACID) 500 MG tablet Take 500 mg by mouth every other day.      No current facility-administered medications for this visit.    PHYSICAL EXAMINATION: ECOG PERFORMANCE STATUS: 1 - Symptomatic but completely ambulatory  Filed Vitals:   05/21/14 0913  BP: 131/50  Pulse: 78  Temp: 97.7 F (36.5 C)  Resp: 18   Filed Weights   05/21/14 0913  Weight: 209 lb 9.6 oz (95.074 kg)    GENERAL:alert, no distress and comfortable SKIN: skin color, texture, turgor  are normal, no rashes or significant lesions EYES: normal, Conjunctiva are pink and non-injected, sclera clear OROPHARYNX:no exudate, no erythema and lips, buccal mucosa, and tongue normal  NECK: supple, thyroid normal size, non-tender, without nodularity LYMPH:  no palpable lymphadenopathy in the cervical, axillary or inguinal LUNGS: clear to auscultation and percussion  with normal breathing effort HEART: regular rate & rhythm and no murmurs and no lower extremity edema ABDOMEN:abdomen soft, non-tender and normal bowel sounds Musculoskeletal:no cyanosis of digits and no clubbing  NEURO: alert & oriented x 3 with fluent speech, no focal motor/sensory deficits  LABORATORY DATA:  I have reviewed the data as listed   Chemistry      Component Value Date/Time   NA 142 05/21/2014 0858   NA 136 10/26/2012 0918   K 4.1 05/21/2014 0858   K 3.3* 10/26/2012 0918   CL 101 10/26/2012 0918   CO2 24 05/21/2014 0858   CO2 27 10/26/2012 0918   BUN 8.3 05/21/2014 0858   BUN 15 10/26/2012 0918   CREATININE 0.7 05/21/2014 0858   CREATININE 0.60 10/26/2012 0918   GLU 124* 03/10/2013 1438      Component Value Date/Time   CALCIUM 9.2 05/21/2014 0858   CALCIUM 8.4 10/26/2012 0918   ALKPHOS 111 05/21/2014 0858   ALKPHOS 118* 10/26/2012 0918   AST 17 05/21/2014 0858   AST 12 10/26/2012 0918   ALT 28 05/21/2014 0858   ALT 18 10/26/2012 0918   BILITOT 0.93 05/21/2014 0858   BILITOT 0.6 10/26/2012 0918       Lab Results  Component Value Date   WBC 10.0 05/21/2014   HGB 10.4* 05/21/2014   HCT 30.6* 05/21/2014   MCV 80.9 05/21/2014   PLT 209 05/21/2014   NEUTROABS 7.6* 05/21/2014     ASSESSMENT & PLAN:  Breast cancer of lower-outer quadrant of right female breast Right breast invasive ductal carcinoma ER/PR positive HER-2 negative stage II A. T3, N1, M0 with PTEN mutation Current treatment: Adjuvant Zoladex and exemestane started 07/03/2013, plan is for 5 years; changed to every 3 month Zoladex injection from May 2016  Toxicities of therapy: 1. Hot flashes requiring supplementation with Effexor and Neurontin 2. Injection site pain patient uses ice cube prior to injection but I instructed her to use EMLA cream  Surveillance for breast cancer: 1. Breast exam 01/21/2014 normal 2. Mammogram August 2015 normal 3. MRI January 2015 revealed changes in the  right breast, most likely postsurgical in nature 4. Bone density done July 2015 was normal next bone density to be done July 2017 Patient palpated a cordlike thickening in the right breast which was further evaluated by diagnostic mammogram, which came back as normal  Anemia due to malabsorption versus Spherocytosis: Monitoring hemoglobin levels. Hemoglobin is 10.4. Does not require any injections because her hemoglobin is greater than 10 and she is asymptomatic.  Return to clinic in 6 months for follow-up and monthly for Zoladex injections    No orders of the defined types were placed in this encounter.   The patient has a good understanding of the overall plan. she agrees with it. She will call with any problems that may develop before her next visit here.   Rulon Eisenmenger, MD

## 2014-06-22 ENCOUNTER — Ambulatory Visit (HOSPITAL_BASED_OUTPATIENT_CLINIC_OR_DEPARTMENT_OTHER): Payer: BLUE CROSS/BLUE SHIELD

## 2014-06-22 VITALS — BP 131/67 | HR 83 | Temp 99.0°F

## 2014-06-22 DIAGNOSIS — C50511 Malignant neoplasm of lower-outer quadrant of right female breast: Secondary | ICD-10-CM

## 2014-06-22 DIAGNOSIS — Z5111 Encounter for antineoplastic chemotherapy: Secondary | ICD-10-CM

## 2014-06-22 MED ORDER — GOSERELIN ACETATE 10.8 MG ~~LOC~~ IMPL
10.8000 mg | DRUG_IMPLANT | SUBCUTANEOUS | Status: DC
  Administered 2014-06-22: 10.8 mg via SUBCUTANEOUS
  Filled 2014-06-22: qty 10.8

## 2014-06-22 NOTE — Addendum Note (Signed)
Addended by: Neysa Hotter on: 06/22/2014 09:36 AM   Modules accepted: Orders

## 2014-07-23 ENCOUNTER — Ambulatory Visit: Payer: Self-pay

## 2014-09-10 ENCOUNTER — Ambulatory Visit (HOSPITAL_BASED_OUTPATIENT_CLINIC_OR_DEPARTMENT_OTHER): Payer: BLUE CROSS/BLUE SHIELD | Admitting: Hematology and Oncology

## 2014-09-10 ENCOUNTER — Ambulatory Visit (HOSPITAL_BASED_OUTPATIENT_CLINIC_OR_DEPARTMENT_OTHER): Payer: BLUE CROSS/BLUE SHIELD

## 2014-09-10 ENCOUNTER — Telehealth: Payer: Self-pay | Admitting: Hematology and Oncology

## 2014-09-10 ENCOUNTER — Other Ambulatory Visit (HOSPITAL_BASED_OUTPATIENT_CLINIC_OR_DEPARTMENT_OTHER): Payer: BLUE CROSS/BLUE SHIELD

## 2014-09-10 ENCOUNTER — Encounter: Payer: Self-pay | Admitting: Hematology and Oncology

## 2014-09-10 DIAGNOSIS — D649 Anemia, unspecified: Secondary | ICD-10-CM

## 2014-09-10 DIAGNOSIS — C50511 Malignant neoplasm of lower-outer quadrant of right female breast: Secondary | ICD-10-CM

## 2014-09-10 DIAGNOSIS — Z170 Estrogen receptor positive status [ER+]: Secondary | ICD-10-CM | POA: Diagnosis not present

## 2014-09-10 DIAGNOSIS — N951 Menopausal and female climacteric states: Secondary | ICD-10-CM | POA: Diagnosis not present

## 2014-09-10 DIAGNOSIS — Z5111 Encounter for antineoplastic chemotherapy: Secondary | ICD-10-CM | POA: Diagnosis not present

## 2014-09-10 LAB — COMPREHENSIVE METABOLIC PANEL (CC13)
ALK PHOS: 109 U/L (ref 40–150)
ALT: 25 U/L (ref 0–55)
ANION GAP: 8 meq/L (ref 3–11)
AST: 16 U/L (ref 5–34)
Albumin: 3.8 g/dL (ref 3.5–5.0)
BUN: 9.6 mg/dL (ref 7.0–26.0)
CALCIUM: 9.4 mg/dL (ref 8.4–10.4)
CHLORIDE: 107 meq/L (ref 98–109)
CO2: 25 mEq/L (ref 22–29)
Creatinine: 0.8 mg/dL (ref 0.6–1.1)
EGFR: 84 mL/min/{1.73_m2} — ABNORMAL LOW (ref >90)
Glucose: 254 mg/dl — ABNORMAL HIGH (ref 70–140)
Potassium: 4.4 mEq/L (ref 3.5–5.1)
SODIUM: 140 meq/L (ref 136–145)
TOTAL PROTEIN: 7.2 g/dL (ref 6.4–8.3)
Total Bilirubin: 1.32 mg/dL — ABNORMAL HIGH (ref 0.20–1.20)

## 2014-09-10 LAB — CBC WITH DIFFERENTIAL/PLATELET
BASO%: 0.6 % (ref 0.0–2.0)
Basophils Absolute: 0.1 10*3/uL (ref 0.0–0.1)
EOS%: 2.1 % (ref 0.0–7.0)
Eosinophils Absolute: 0.2 10*3/uL (ref 0.0–0.5)
HCT: 32.3 % — ABNORMAL LOW (ref 34.8–46.6)
HGB: 11.1 g/dL — ABNORMAL LOW (ref 11.6–15.9)
LYMPH%: 14 % (ref 14.0–49.7)
MCH: 28.1 pg (ref 25.1–34.0)
MCHC: 34.2 g/dL (ref 31.5–36.0)
MCV: 82.2 fL (ref 79.5–101.0)
MONO#: 0.5 10*3/uL (ref 0.1–0.9)
MONO%: 4.3 % (ref 0.0–14.0)
NEUT%: 79 % — ABNORMAL HIGH (ref 38.4–76.8)
NEUTROS ABS: 8.8 10*3/uL — AB (ref 1.5–6.5)
Platelets: 218 10*3/uL (ref 145–400)
RBC: 3.93 10*6/uL (ref 3.70–5.45)
RDW: 21.7 % — ABNORMAL HIGH (ref 11.2–14.5)
WBC: 11.2 10*3/uL — AB (ref 3.9–10.3)
lymph#: 1.6 10*3/uL (ref 0.9–3.3)

## 2014-09-10 MED ORDER — GOSERELIN ACETATE 10.8 MG ~~LOC~~ IMPL
10.8000 mg | DRUG_IMPLANT | SUBCUTANEOUS | Status: DC
  Administered 2014-09-10: 10.8 mg via SUBCUTANEOUS
  Filled 2014-09-10: qty 10.8

## 2014-09-10 NOTE — Assessment & Plan Note (Signed)
Right breast invasive ductal carcinoma ER/PR positive HER-2 negative stage II A. T3, N1, M0 with PTEN mutation Current treatment: Adjuvant Zoladex and exemestane started 07/03/2013, plan is for 5 years; changed to every 3 month Zoladex injection from May 2016  Toxicities of therapy: 1. Hot flashes requiring supplementation with Effexor and Neurontin 2. Injection site pain patient uses ice cube prior to injection but I instructed her to use EMLA cream  Surveillance for breast cancer: 1. Breast exam 01/21/2014 normal 2. Mammogram August 2015 normal 3. MRI January 2015 revealed changes in the right breast, most likely postsurgical in nature 4. Bone density done July 2015 was normal next bone density to be done July 2017 Patient palpated a cordlike thickening in the right breast which was further evaluated by diagnostic mammogram, which came back as normal  Anemia due to malabsorption versus Spherocytosis: Monitoring hemoglobin levels. Hemoglobin is 10.4. Does not require any injections because her hemoglobin is greater than 10 and she is asymptomatic.  Return to clinic in 6 months for follow-up and  q 3 months for Zoladex injections

## 2014-09-10 NOTE — Telephone Encounter (Signed)
Gave avs & calendar for November.  °

## 2014-09-10 NOTE — Progress Notes (Signed)
Patient Care Team: Darlyne Russian, MD as PCP - General (Family Medicine) Nicholas Lose, MD as Consulting Physician (Hematology and Oncology)  DIAGNOSIS: Breast cancer of lower-outer quadrant of right female breast   Staging form: Breast, AJCC 7th Edition     Clinical: No stage assigned - Unsigned     Pathologic: Stage IIA (T3, N1, cM0) - Unsigned       Staging comments: EP+PR+ Her2Neu-    SUMMARY OF ONCOLOGIC HISTORY:   Breast cancer of lower-outer quadrant of right female breast   09/08/2012 Initial Diagnosis Breast cancer of lower-outer quadrant of right female breast: ER 99% PR 100% HER-2 negative, Ki-67 70%   10/07/2012 - 03/17/2013 Neo-Adjuvant Chemotherapy Dose dense FEC followed by weekly Taxol   04/06/2013 Surgery Right breast lumpectomy residual 1.1 cm IDC grade 3 with high-grade DCIS, PNI +ve, 1/2 SLN positive, no MVI, T1 C. N1 A. stage IIA, ER 97% PR 95% HER-2 1.1 ratio negative   05/08/2013 - 06/22/2013 Radiation Therapy Radiation therapy to lumpectomy site   07/03/2013 -  Anti-estrogen oral therapy Zoladex plus Aromasin    Procedure Genetic testing:PTEN C.-(1088_1063)del 26 on [employer] testing    CHIEF COMPLIANT: Follow-up on Zoladex plus Aromasin  INTERVAL HISTORY: Mary Dougherty is a 44 year old with above-mentioned history of right breast cancer currently on ovarian suppression with Zoladex along with aromatase inhibitor therapy with Aromasin. She is tolerating it fairly well without any major problems or concerns. Denies any lumps or nodules breasts.  REVIEW OF SYSTEMS:   Constitutional: Denies fevers, chills or abnormal weight loss Eyes: Denies blurriness of vision Ears, nose, mouth, throat, and face: Denies mucositis or sore throat Respiratory: Denies cough, dyspnea or wheezes Cardiovascular: Denies palpitation, chest discomfort or lower extremity swelling Gastrointestinal:  Denies nausea, heartburn or change in bowel habits Skin: Denies abnormal skin rashes Lymphatics:  Denies new lymphadenopathy or easy bruising Neurological:Denies numbness, tingling or new weaknesses Behavioral/Psych: Mood is stable, no new changes  Breast:  denies any pain or lumps or nodules in either breasts All other systems were reviewed with the patient and are negative.  I have reviewed the past medical history, past surgical history, social history and family history with the patient and they are unchanged from previous note.  ALLERGIES:  has No Known Allergies.  MEDICATIONS:  Current Outpatient Prescriptions  Medication Sig Dispense Refill  . Biotin 5 MG CAPS Take 10,000 mg by mouth every other day.     . Calcium Carb-Cholecalciferol (CALCIUM PLUS VITAMIN D3) 600-500 MG-UNIT CAPS Take 1 tablet by mouth every other day.     . exemestane (AROMASIN) 25 MG tablet Take 1 tablet (25 mg total) by mouth daily after breakfast. 90 tablet 2  . folic acid (FOLVITE) 831 MCG tablet Take 400 mcg by mouth every other day.     . vitamin C (ASCORBIC ACID) 500 MG tablet Take 500 mg by mouth every other day.      No current facility-administered medications for this visit.    PHYSICAL EXAMINATION: ECOG PERFORMANCE STATUS: 1 - Symptomatic but completely ambulatory  Filed Vitals:   09/10/14 0911  BP: 127/62  Pulse: 70  Temp: 97.7 F (36.5 C)  Resp: 18   Filed Weights   09/10/14 0911  Weight: 204 lb 12.8 oz (92.897 kg)    GENERAL:alert, no distress and comfortable SKIN: skin color, texture, turgor are normal, no rashes or significant lesions EYES: normal, Conjunctiva are pink and non-injected, sclera clear OROPHARYNX:no exudate, no erythema and lips, buccal  mucosa, and tongue normal  NECK: supple, thyroid normal size, non-tender, without nodularity LYMPH:  no palpable lymphadenopathy in the cervical, axillary or inguinal LUNGS: clear to auscultation and percussion with normal breathing effort HEART: regular rate & rhythm and no murmurs and no lower extremity edema ABDOMEN:abdomen  soft, non-tender and normal bowel sounds Musculoskeletal:no cyanosis of digits and no clubbing  NEURO: alert & oriented x 3 with fluent speech, no focal motor/sensory deficits LABORATORY DATA:  I have reviewed the data as listed   Chemistry      Component Value Date/Time   NA 142 05/21/2014 0858   NA 136 10/26/2012 0918   K 4.1 05/21/2014 0858   K 3.3* 10/26/2012 0918   CL 101 10/26/2012 0918   CO2 24 05/21/2014 0858   CO2 27 10/26/2012 0918   BUN 8.3 05/21/2014 0858   BUN 15 10/26/2012 0918   CREATININE 0.7 05/21/2014 0858   CREATININE 0.60 10/26/2012 0918   GLU 124* 03/10/2013 1438      Component Value Date/Time   CALCIUM 9.2 05/21/2014 0858   CALCIUM 8.4 10/26/2012 0918   ALKPHOS 111 05/21/2014 0858   ALKPHOS 118* 10/26/2012 0918   AST 17 05/21/2014 0858   AST 12 10/26/2012 0918   ALT 28 05/21/2014 0858   ALT 18 10/26/2012 0918   BILITOT 0.93 05/21/2014 0858   BILITOT 0.6 10/26/2012 0918       Lab Results  Component Value Date   WBC 11.2* 09/10/2014   HGB 11.1* 09/10/2014   HCT 32.3* 09/10/2014   MCV 82.2 09/10/2014   PLT 218 09/10/2014   NEUTROABS 8.8* 09/10/2014   ASSESSMENT & PLAN:  Breast cancer of lower-outer quadrant of right female breast Right breast invasive ductal carcinoma ER/PR positive HER-2 negative stage II A. T3, N1, M0 with PTEN mutation Current treatment: Adjuvant Zoladex and exemestane started 07/03/2013, plan is for 5 years; changed to every 3 month Zoladex injection from May 2016  Toxicities of therapy: 1. Hot flashes requiring supplementation with Effexor and Neurontin 2. Injection site pain patient uses ice cube prior to injection but I instructed her to use EMLA cream  Surveillance for breast cancer: 1. Breast exam 01/21/2014 normal 2. Mammogram August 2015 normal 3. MRI January 2015 revealed changes in the right breast, most likely postsurgical in nature 4. Bone density done July 2015 was normal next bone density to be done July  2017  Anemia due to malabsorption versus Spherocytosis: Monitoring hemoglobin levels. Hemoglobin is 11.1.  Return to clinic in 6 months for follow-up and  q 3 months for Zoladex injections    Orders Placed This Encounter  Procedures  . Estradiol, Ultra Sens    Standing Status: Future     Number of Occurrences: 1     Standing Expiration Date: 09/10/2015   The patient has a good understanding of the overall plan. she agrees with it. she will call with any problems that may develop before the next visit here.   Rulon Eisenmenger, MD

## 2014-09-14 LAB — ESTRADIOL, ULTRA SENS

## 2014-09-19 ENCOUNTER — Telehealth: Payer: Self-pay

## 2014-09-19 NOTE — Telephone Encounter (Signed)
Mammogram order faxed to [facility].  Sent to scan.

## 2014-09-24 ENCOUNTER — Ambulatory Visit: Payer: BLUE CROSS/BLUE SHIELD | Admitting: Hematology and Oncology

## 2014-09-24 ENCOUNTER — Other Ambulatory Visit: Payer: BLUE CROSS/BLUE SHIELD

## 2014-09-30 ENCOUNTER — Other Ambulatory Visit: Payer: Self-pay | Admitting: Hematology

## 2014-10-05 ENCOUNTER — Other Ambulatory Visit: Payer: Self-pay | Admitting: *Deleted

## 2014-10-05 ENCOUNTER — Telehealth: Payer: Self-pay | Admitting: *Deleted

## 2014-10-05 DIAGNOSIS — C50511 Malignant neoplasm of lower-outer quadrant of right female breast: Secondary | ICD-10-CM

## 2014-10-05 MED ORDER — EXEMESTANE 25 MG PO TABS: 25.0000 mg | ORAL_TABLET | Freq: Every day | ORAL | Status: DC

## 2014-10-05 NOTE — Telephone Encounter (Signed)
PT.'S PHARMACY HAS NOT HEARD FROM DR.GUDENA.

## 2014-10-05 NOTE — Telephone Encounter (Signed)
rx e-scribed 

## 2014-10-10 ENCOUNTER — Telehealth: Payer: Self-pay

## 2014-10-10 NOTE — Telephone Encounter (Signed)
Mammogram and ultrasound results dtd 10/01/14 rcvd from [facility].  Plan of care update rcvd from Hospice dtd 8/24.  Reviewed by Dr Lindi Adie.  Sent to scan.

## 2014-11-06 ENCOUNTER — Encounter: Payer: Self-pay | Admitting: Emergency Medicine

## 2014-12-09 NOTE — Assessment & Plan Note (Signed)
Right breast invasive ductal carcinoma ER/PR positive HER-2 negative stage II A. T3, N1, M0 with PTEN mutation Current treatment: Adjuvant Zoladex and exemestane started 07/03/2013, plan is for 5 years; changed to every 3 month Zoladex injection from May 2016  Toxicities of therapy: 1. Hot flashes requiring supplementation with Effexor and Neurontin 2. Injection site pain patient uses ice cube prior to injection but I instructed her to use EMLA cream  Surveillance for breast cancer: 1. Breast exam 12/10/14 normal 2. Mammogram August 2016 normal, Density cat B 3. MRI January 2015 revealed changes in the right breast, most likely postsurgical in nature 4. Bone density done July 2015 was normal next bone density to be done July 2017  Anemia due to malabsorption versus Spherocytosis: Monitoring hemoglobin levels. Hemoglobin is 11.1.  Return to clinic in 6 months for follow-up and q 3 months for Zoladex injections

## 2014-12-10 ENCOUNTER — Other Ambulatory Visit (HOSPITAL_BASED_OUTPATIENT_CLINIC_OR_DEPARTMENT_OTHER): Payer: BLUE CROSS/BLUE SHIELD

## 2014-12-10 ENCOUNTER — Ambulatory Visit (HOSPITAL_BASED_OUTPATIENT_CLINIC_OR_DEPARTMENT_OTHER): Payer: BLUE CROSS/BLUE SHIELD | Admitting: Hematology and Oncology

## 2014-12-10 ENCOUNTER — Telehealth: Payer: Self-pay | Admitting: Hematology and Oncology

## 2014-12-10 ENCOUNTER — Encounter: Payer: Self-pay | Admitting: Hematology and Oncology

## 2014-12-10 ENCOUNTER — Ambulatory Visit (HOSPITAL_BASED_OUTPATIENT_CLINIC_OR_DEPARTMENT_OTHER): Payer: BLUE CROSS/BLUE SHIELD

## 2014-12-10 VITALS — BP 121/69 | HR 69 | Temp 98.2°F | Resp 18 | Ht 65.0 in | Wt 202.6 lb

## 2014-12-10 DIAGNOSIS — N951 Menopausal and female climacteric states: Secondary | ICD-10-CM | POA: Diagnosis not present

## 2014-12-10 DIAGNOSIS — C50511 Malignant neoplasm of lower-outer quadrant of right female breast: Secondary | ICD-10-CM

## 2014-12-10 DIAGNOSIS — Z5111 Encounter for antineoplastic chemotherapy: Secondary | ICD-10-CM

## 2014-12-10 DIAGNOSIS — D649 Anemia, unspecified: Secondary | ICD-10-CM | POA: Diagnosis not present

## 2014-12-10 DIAGNOSIS — Z170 Estrogen receptor positive status [ER+]: Secondary | ICD-10-CM | POA: Diagnosis not present

## 2014-12-10 LAB — CBC WITH DIFFERENTIAL/PLATELET
BASO%: 0.3 % (ref 0.0–2.0)
Basophils Absolute: 0 10*3/uL (ref 0.0–0.1)
EOS%: 2.4 % (ref 0.0–7.0)
Eosinophils Absolute: 0.3 10*3/uL (ref 0.0–0.5)
HEMATOCRIT: 33.2 % — AB (ref 34.8–46.6)
HGB: 11.5 g/dL — ABNORMAL LOW (ref 11.6–15.9)
LYMPH%: 15.9 % (ref 14.0–49.7)
MCH: 28.4 pg (ref 25.1–34.0)
MCHC: 34.6 g/dL (ref 31.5–36.0)
MCV: 82 fL (ref 79.5–101.0)
MONO#: 0.6 10*3/uL (ref 0.1–0.9)
MONO%: 4.9 % (ref 0.0–14.0)
NEUT#: 9.1 10*3/uL — ABNORMAL HIGH (ref 1.5–6.5)
NEUT%: 76.5 % (ref 38.4–76.8)
Platelets: 187 10*3/uL (ref 145–400)
RBC: 4.05 10*6/uL (ref 3.70–5.45)
RDW: 19.7 % — ABNORMAL HIGH (ref 11.2–14.5)
WBC: 11.9 10*3/uL — ABNORMAL HIGH (ref 3.9–10.3)
lymph#: 1.9 10*3/uL (ref 0.9–3.3)

## 2014-12-10 LAB — COMPREHENSIVE METABOLIC PANEL (CC13)
ALT: 28 U/L (ref 0–55)
AST: 19 U/L (ref 5–34)
Albumin: 3.9 g/dL (ref 3.5–5.0)
Alkaline Phosphatase: 107 U/L (ref 40–150)
Anion Gap: 9 mEq/L (ref 3–11)
BUN: 11.5 mg/dL (ref 7.0–26.0)
CHLORIDE: 106 meq/L (ref 98–109)
CO2: 25 mEq/L (ref 22–29)
Calcium: 9.8 mg/dL (ref 8.4–10.4)
Creatinine: 0.7 mg/dL (ref 0.6–1.1)
EGFR: >90 mL/min/{1.73_m2} (ref >90)
GLUCOSE: 154 mg/dL — AB (ref 70–140)
POTASSIUM: 4.1 meq/L (ref 3.5–5.1)
Sodium: 140 mEq/L (ref 136–145)
Total Bilirubin: 1.39 mg/dL — ABNORMAL HIGH (ref 0.20–1.20)
Total Protein: 7.3 g/dL (ref 6.4–8.3)

## 2014-12-10 MED ORDER — GOSERELIN ACETATE 10.8 MG ~~LOC~~ IMPL
10.8000 mg | DRUG_IMPLANT | SUBCUTANEOUS | Status: DC
  Administered 2014-12-10: 10.8 mg via SUBCUTANEOUS
  Filled 2014-12-10: qty 10.8

## 2014-12-10 NOTE — Progress Notes (Signed)
Patient Care Team: Darlyne Russian, MD as PCP - General (Family Medicine) Nicholas Lose, MD as Consulting Physician (Hematology and Oncology)  DIAGNOSIS: Breast cancer of lower-outer quadrant of right female breast [facility])   Staging form: Breast, AJCC 7th Edition     Clinical: No stage assigned - Unsigned     Pathologic: Stage IIA (T3, N1, cM0) - Unsigned       Staging comments: EP+PR+ Her2Neu-    SUMMARY OF ONCOLOGIC HISTORY:   Breast cancer of lower-outer quadrant of right female breast ([place])   09/08/2012 Initial Diagnosis Breast cancer of lower-outer quadrant of right female breast: ER 99% PR 100% HER-2 negative, Ki-67 70%   10/07/2012 - 03/17/2013 Neo-Adjuvant Chemotherapy Dose dense FEC followed by weekly Taxol   04/06/2013 Surgery Right breast lumpectomy residual 1.1 cm IDC grade 3 with high-grade DCIS, PNI +ve, 1/2 SLN positive, no MVI, T1 C. N1 A. stage IIA, ER 97% PR 95% HER-2 1.1 ratio negative   05/08/2013 - 06/22/2013 Radiation Therapy Radiation therapy to lumpectomy site   07/03/2013 -  Anti-estrogen oral therapy Zoladex plus Aromasin    Procedure Genetic testing:PTEN C.-(1088_1063)del 26 on [employer] testing    CHIEF COMPLIANT: follow-up of antiestrogen therapy  INTERVAL HISTORY: Mary Dougherty is a 44 year old above-mentioned history of right breast cancer currently on Zoladex was Aromasin will. She is tolerating the treatment fairly well. She had hot flashes which have improved significantly. She has injection site pain for which he uses an ice. Denies any myalgias other than some in the fingers. Energy levels are much improved. Her blood sugars today are elevated. She has extensive family history of diabetes. She is going on a cruise vacation in February. We're watching her estradiol levels to check if ovarian suppression has been effective.  REVIEW OF SYSTEMS:   Constitutional: Denies fevers, chills or abnormal weight loss Eyes: Denies blurriness of vision Ears, nose, mouth,  throat, and face: Denies mucositis or sore throat Respiratory: Denies cough, dyspnea or wheezes Cardiovascular: Denies palpitation, chest discomfort or lower extremity swelling Gastrointestinal:  Denies nausea, heartburn or change in bowel habits Skin: Denies abnormal skin rashes Lymphatics: Denies new lymphadenopathy or easy bruising Neurological:Denies numbness, tingling or new weaknesses Behavioral/Psych: Mood is stable, no new changes  Breast:  denies any pain or lumps or nodules in either breasts All other systems were reviewed with the patient and are negative.  I have reviewed the past medical history, past surgical history, social history and family history with the patient and they are unchanged from previous note.  ALLERGIES:  has No Known Allergies.  MEDICATIONS:  Current Outpatient Prescriptions  Medication Sig Dispense Refill  . Calcium Carb-Cholecalciferol (CALCIUM PLUS VITAMIN D3) 600-500 MG-UNIT CAPS Take 1 tablet by mouth every other day.     . exemestane (AROMASIN) 25 MG tablet Take 1 tablet (25 mg total) by mouth daily after breakfast. 90 tablet 3   No current facility-administered medications for this visit.    PHYSICAL EXAMINATION: ECOG PERFORMANCE STATUS: 1 - Symptomatic but completely ambulatory  Filed Vitals:   12/10/14 0825  BP: 121/69  Pulse: 69  Temp: 98.2 F (36.8 C)  Resp: 18   Filed Weights   12/10/14 0825  Weight: 202 lb 9.6 oz (91.899 kg)    GENERAL:alert, no distress and comfortable SKIN: skin color, texture, turgor are normal, no rashes or significant lesions EYES: normal, Conjunctiva are pink and non-injected, sclera clear OROPHARYNX:no exudate, no erythema and lips, buccal mucosa, and tongue normal  NECK: supple, thyroid normal size, non-tender, without nodularity LYMPH:  no palpable lymphadenopathy in the cervical, axillary or inguinal LUNGS: clear to auscultation and percussion with normal breathing effort HEART: regular rate &  rhythm and no murmurs and no lower extremity edema ABDOMEN:abdomen soft, non-tender and normal bowel sounds Musculoskeletal:no cyanosis of digits and no clubbing  NEURO: alert & oriented x 3 with fluent speech, no focal motor/sensory deficits BREAST: No palpable masses or nodules in either right or left breasts. No palpable axillary supraclavicular or infraclavicular adenopathy no breast tenderness or nipple discharge. (exam performed in the presence of a chaperone)  LABORATORY DATA:  I have reviewed the data as listed   Chemistry      Component Value Date/Time   NA 140 12/10/2014 0801   NA 136 10/26/2012 0918   K 4.1 12/10/2014 0801   K 3.3* 10/26/2012 0918   CL 101 10/26/2012 0918   CO2 25 12/10/2014 0801   CO2 27 10/26/2012 0918   BUN 11.5 12/10/2014 0801   BUN 15 10/26/2012 0918   CREATININE 0.7 12/10/2014 0801   CREATININE 0.60 10/26/2012 0918   GLU 124* 03/10/2013 1438      Component Value Date/Time   CALCIUM 9.8 12/10/2014 0801   CALCIUM 8.4 10/26/2012 0918   ALKPHOS 107 12/10/2014 0801   ALKPHOS 118* 10/26/2012 0918   AST 19 12/10/2014 0801   AST 12 10/26/2012 0918   ALT 28 12/10/2014 0801   ALT 18 10/26/2012 0918   BILITOT 1.39* 12/10/2014 0801   BILITOT 0.6 10/26/2012 0918       Lab Results  Component Value Date   WBC 11.9* 12/10/2014   HGB 11.5* 12/10/2014   HCT 33.2* 12/10/2014   MCV 82.0 12/10/2014   PLT 187 12/10/2014   NEUTROABS 9.1* 12/10/2014   ASSESSMENT & PLAN:  Breast cancer of lower-outer quadrant of right female breast Right breast invasive ductal carcinoma ER/PR positive HER-2 negative stage II A. T3, N1, M0 with PTEN mutation Current treatment: Adjuvant Zoladex and exemestane started 07/03/2013, plan is for 5 years; changed to every 3 month Zoladex injection from May 2016  Toxicities of therapy: 1. Hot flashes : improving 2. Injection site pain patient uses ice cube prior to injection but I instructed her to use EMLA cream  Surveillance  for breast cancer: 1. Breast exam 12/10/14 normal 2. Mammogram August 2016 normal, Density cat B 3. MRI January 2015 revealed changes in the right breast, most likely postsurgical in nature 4. Bone density done July 2015 was normal next bone density to be done July 2017  Anemia due to malabsorption versus Spherocytosis: Monitoring hemoglobin levels. Hemoglobin is 11.5.  Return to clinic in 3 months for follow-up and q 3 months for Zoladex injections She will get labs one week ahead of the next visit   Orders Placed This Encounter  Procedures  . CBC with Differential    Standing Status: Future     Number of Occurrences:      Standing Expiration Date: 12/10/2015  . Comprehensive metabolic panel (Cmet) - [facility]    Standing Status: Future     Number of Occurrences:      Standing Expiration Date: 12/10/2015  . Estradiol, Ultra Sens    Standing Status: Future     Number of Occurrences:      Standing Expiration Date: 12/10/2015   The patient has a good understanding of the overall plan. she agrees with it. she will call with any problems that may develop before  the next visit here.   Rulon Eisenmenger, MD 12/10/2014

## 2014-12-10 NOTE — Telephone Encounter (Signed)
Appointments made and avs pritned for pateint °

## 2014-12-14 LAB — ESTRADIOL, ULTRA SENS

## 2014-12-28 ENCOUNTER — Encounter: Payer: Self-pay | Admitting: Emergency Medicine

## 2014-12-28 ENCOUNTER — Ambulatory Visit (INDEPENDENT_AMBULATORY_CARE_PROVIDER_SITE_OTHER): Payer: BLUE CROSS/BLUE SHIELD | Admitting: Emergency Medicine

## 2014-12-28 VITALS — BP 147/77 | HR 85 | Temp 98.2°F | Resp 16 | Ht 66.0 in | Wt 199.0 lb

## 2014-12-28 DIAGNOSIS — J029 Acute pharyngitis, unspecified: Secondary | ICD-10-CM | POA: Diagnosis not present

## 2014-12-28 DIAGNOSIS — J0100 Acute maxillary sinusitis, unspecified: Secondary | ICD-10-CM

## 2014-12-28 LAB — POCT RAPID STREP A (OFFICE): Rapid Strep A Screen: NEGATIVE

## 2014-12-28 MED ORDER — FLUTICASONE PROPIONATE 50 MCG/ACT NA SUSP: 2.0000 | Freq: Every day | NASAL | Status: DC

## 2014-12-28 MED ORDER — AMOXICILLIN 875 MG PO TABS: 875.0000 mg | ORAL_TABLET | Freq: Two times a day (BID) | ORAL | Status: DC

## 2014-12-28 NOTE — Progress Notes (Addendum)
This chart was scribed for Lesle Chris, MD by Andrew Au, ED Scribe. This patient was seen in room 21 and the patient's care was started at 11:26 AM.  Chief Complaint:  Chief Complaint  Patient presents with  . Sinus Problem    URI symptoms xcouple days    HPI: Mary Dougherty is a 44 y.o. female who reports to [facility] today complaining of sinus problem. Pt states she woke up 2 days ago with a sore throat and some ear pain. Symptoms have gradually worsened with green mucous drainage, pain with swallowing and chest congestion but is having difficulty producing mucous from chest. She started taking mucinex 2 days ago with minimal relief. She denies sick contacts. She denies fever, ST and cough. No drug allergies.  She has finished treatment with breast cancer. She still has to have anti estrogen and anti hormone every 3 months. Next hormone level check in February.    Past Medical History  Diagnosis Date  . Anemia   . Thyroid disease   . Wears glasses   . Spherocytosis (HCC) 1989  . Invasive ductal carcinoma of right breast (HCC) 09/2012    ER(99%), PR(100%), Ki-67(70%), 1/5 nodes positive  . Chronic anemia 03/03/13  . Status post chemotherapy 10/07/12 - 12/16/12     Neoadjuvant chemotherapy with dose dense FEC (5-FU/epirubicin/Cytoxan) from 10/07/2012 through 12/16/12.    . S/P radiation therapy 05/08/2013-06/22/2013    1) Right breast / 50.4 Gy in 28 fractions / 2) Right Supraclavicular fossa/ 50.4 Gy in 28 fractions /3) Right Posterior Axillary boost / 11.9 Gy in 28 fractions /4) Right breast boost / 10 Gy in 5 fractions   . Use of goserelin acetate (Zoladex) 07/25/13  . Use of exemestane (Aromasin) 07/25/13  . Hereditary spherocytosis (HCC)   . Blood transfusion without reported diagnosis    Past Surgical History  Procedure Laterality Date  . Wisdom tooth extraction    . Portacath placement Left 09/20/2012    Procedure: INSERTION PORT-A-CATH;  Surgeon: Emelia Loron, MD;   Location: [facility];  Service: General;  Laterality: Left;  . Port-a-cath removal N/A 04/06/2013    Procedure: REMOVAL PORT-A-CATH;  Surgeon: Emelia Loron, MD;  Location: [facility];  Service: General;  Laterality: N/A;  . Breast surgery      left breast lumpectomy/left ax snbx  . Breast lumpectomy with sentinel lymph node biopsy Right 04/06/2013    Emelia Loron   Social History   Social History  . Marital Status: Single    Spouse Name: N/A  . Number of Children: N/A  . Years of Education: N/A   Social History Main Topics  . Smoking status: Never Smoker   . Smokeless tobacco: Never Used  . Alcohol Use: No  . Drug Use: No  . Sexual Activity: No     Comment: menarche age 44, P0,  last menstrual cycle 08/26/2012   Other Topics Concern  . Not on file   Social History Narrative   Family History  Problem Relation Age of Onset  . Pulmonary embolism Maternal Uncle 45    died instantly  . Melanoma Cousin     paternal cousin dx in her 31s  . Heart disease Father   . Diabetes Father   . Thyroid disease Mother    No Known Allergies Prior to Admission medications   Medication Sig Start Date End Date Taking? Authorizing Provider  Calcium Carb-Cholecalciferol (CALCIUM PLUS VITAMIN D3) 600-500 MG-UNIT CAPS Take  1 tablet by mouth every other day.     Historical Provider, MD  exemestane (AROMASIN) 25 MG tablet Take 1 tablet (25 mg total) by mouth daily after breakfast. 10/05/14   Nicholas Lose, MD   ROS: The patient denies fevers, chills, night sweats, unintentional weight loss, chest pain, palpitations, wheezing, dyspnea on exertion, nausea, vomiting, abdominal pain, dysuria, hematuria, melena, numbness, weakness, or tingling.   All other systems have been reviewed and were otherwise negative with the exception of those mentioned in the HPI and as above.    PHYSICAL EXAM: Filed Vitals:   12/28/14 1120  BP: 147/77  Pulse: 85  Temp: 98.2 F (36.8  C)  Resp: 16   Body mass index is 32.13 kg/(m^2).   General: Alert, no acute distress HEENT:  Normocephalic, atraumatic, oropharynx patent. Nasal passages erythematous Throat is erythematous. smal anterior cervical nodes No maxillary sinus tenderness.  Eye: [name] [facility] Cardiovascular:  Regular rate and rhythm, no rubs murmurs or gallops.  No Carotid bruits, radial pulse intact. No pedal edema.  Respiratory: Clear to auscultation bilaterally.  No wheezes, rales, or rhonchi.  No cyanosis, no use of accessory musculature Abdominal: No organomegaly, abdomen is soft and non-tender, positive bowel sounds.  No masses. Musculoskeletal: Gait intact. No edema, tenderness Skin: No rashes. Neurologic: Facial musculature symmetric. Psychiatric: Patient acts appropriately throughout our interaction. Lymphatic: No cervical or submandibular lymphadenopathy    LABS: Results for orders placed or performed in visit on 12/28/14  POCT rapid strep A  Result Value Ref Range   Rapid Strep A Screen Negative Negative    ASSESSMENT/PLAN: We'll treat as acute maxillary sinusitis. She is placed on amoxicillin and Flonase. She was also given instructions regarding care for sinusitis.I personally performed the services described in this documentation, which was scribed in my presence. The recorded information has been reviewed and is accurate. Her sugars have been elevated recently and she is going to make an appointment December 23 to  start to manage this.  Gross sideeffects, risk and benefits, and alternatives of medications d/w patient. Patient is aware that all medications have potential sideeffects and we are unable to predict every sideeffect or drug-drug interaction that may occur.  By signing my name below, I, Raven Small, attest that this documentation has been prepared under the direction and in the presence of Arlyss Queen, MD.  Electronically Signed: Thea Alken, ED Scribe. 12/28/2014. 11:36  AM.  Arlyss Queen MD 12/28/2014 11:20 AM

## 2014-12-28 NOTE — Patient Instructions (Signed)

## 2014-12-29 LAB — CULTURE, GROUP A STREP: Organism ID, Bacteria: NORMAL

## 2015-01-25 ENCOUNTER — Ambulatory Visit (INDEPENDENT_AMBULATORY_CARE_PROVIDER_SITE_OTHER): Payer: BLUE CROSS/BLUE SHIELD | Admitting: Emergency Medicine

## 2015-01-25 ENCOUNTER — Encounter: Payer: Self-pay | Admitting: Emergency Medicine

## 2015-01-25 VITALS — BP 110/64 | HR 71 | Temp 98.1°F | Resp 16 | Ht 65.25 in | Wt 197.2 lb

## 2015-01-25 DIAGNOSIS — Z13220 Encounter for screening for lipoid disorders: Secondary | ICD-10-CM | POA: Diagnosis not present

## 2015-01-25 DIAGNOSIS — J3089 Other allergic rhinitis: Secondary | ICD-10-CM

## 2015-01-25 DIAGNOSIS — G63 Polyneuropathy in diseases classified elsewhere: Secondary | ICD-10-CM | POA: Diagnosis not present

## 2015-01-25 DIAGNOSIS — C801 Malignant (primary) neoplasm, unspecified: Secondary | ICD-10-CM

## 2015-01-25 DIAGNOSIS — R739 Hyperglycemia, unspecified: Secondary | ICD-10-CM | POA: Diagnosis not present

## 2015-01-25 DIAGNOSIS — Z23 Encounter for immunization: Secondary | ICD-10-CM

## 2015-01-25 DIAGNOSIS — J029 Acute pharyngitis, unspecified: Secondary | ICD-10-CM

## 2015-01-25 LAB — COMPLETE METABOLIC PANEL WITH GFR
ALT: 21 U/L (ref 6–29)
AST: 16 U/L (ref 10–35)
Albumin: 4.2 g/dL (ref 3.6–5.1)
Alkaline Phosphatase: 103 U/L (ref 33–115)
BUN: 11 mg/dL (ref 7–25)
CO2: 27 mmol/L (ref 20–31)
Calcium: 9.1 mg/dL (ref 8.6–10.2)
Chloride: 106 mmol/L (ref 98–110)
Creat: 0.54 mg/dL (ref 0.50–1.10)
GFR, Est African American: >89 mL/min (ref >=60)
GFR, Est Non African American: >89 mL/min (ref >=60)
Glucose, Bld: 111 mg/dL — ABNORMAL HIGH (ref 65–99)
Potassium: 4.2 mmol/L (ref 3.5–5.3)
Sodium: 139 mmol/L (ref 135–146)
Total Bilirubin: 1 mg/dL (ref 0.2–1.2)
Total Protein: 7.1 g/dL (ref 6.1–8.1)

## 2015-01-25 LAB — LIPID PANEL
CHOL/HDL RATIO: 4.2 ratio (ref <=5.0)
Cholesterol: 114 mg/dL — ABNORMAL LOW (ref 125–200)
HDL: 27 mg/dL — AB (ref >=46)
LDL CALC: 68 mg/dL (ref <130)
Triglycerides: 96 mg/dL (ref <150)
VLDL: 19 mg/dL (ref <30)

## 2015-01-25 LAB — GLUCOSE, POCT (MANUAL RESULT ENTRY): POC GLUCOSE: 126 mg/dL — AB (ref 70–99)

## 2015-01-25 LAB — TSH: TSH: 3.303 u[IU]/mL (ref 0.350–4.500)

## 2015-01-25 LAB — POCT GLYCOSYLATED HEMOGLOBIN (HGB A1C): HEMOGLOBIN A1C: 5.1

## 2015-01-25 MED ORDER — GABAPENTIN 100 MG PO CAPS: ORAL_CAPSULE | ORAL | Status: DC

## 2015-01-25 NOTE — Progress Notes (Addendum)
By signing my name below, I, Raven Small, attest that this documentation has been prepared under the direction and in the presence of Arlyss Queen, MD.  Electronically Signed: Thea Alken, ED Scribe. 01/25/2015. 8:44 AM.  Chief Complaint:  Chief Complaint  Patient presents with  . Follow-up  . Hbg A1c    HPI: Mary Dougherty is a 44 y.o. female who reports to [facility] today for a follow up.   Possible Diabetes Pt has been hyperglycemic but has never had a formal diagnosis. States her sugars became elevated after starting Chemo, 10/2012. She also developed an itchy, irritating sensation in both feet after starting Chemo and has occasional thirst. No weight changes or nocturia. She has family hx of DM, including her father who insulin dependent, diagnosed in his 30's.   Sinus congestion Pt still has trouble with sinus. States nasal drainage is worse in the morning with discoloration, but is clear throughout the day. She has flonase at home but has not been using this. She has a cat at home which she's had for about 6 years. No cough. Pt is not a smoker.   Past Medical History  Diagnosis Date  . Anemia   . Thyroid disease   . Wears glasses   . Spherocytosis ([place]) 1989  . Invasive ductal carcinoma of right breast ([place]) 09/2012    ER(99%), PR(100%), Ki-67(70%), 1/5 nodes positive  . Chronic anemia 03/03/13  . Status post chemotherapy 10/07/12 - 12/16/12     Neoadjuvant chemotherapy with dose dense FEC (5-FU/epirubicin/Cytoxan) from 10/07/2012 through 12/16/12.    . S/P radiation therapy 05/08/2013-06/22/2013    1) Right breast / 50.4 Gy in 28 fractions / 2) Right Supraclavicular fossa/ 50.4 Gy in 28 fractions /3) Right Posterior Axillary boost / 11.9 Gy in 28 fractions /4) Right breast boost / 10 Gy in 5 fractions   . Use of goserelin acetate (Zoladex) 07/25/13  . Use of exemestane (Aromasin) 07/25/13  . Hereditary spherocytosis ([facility])   . Blood transfusion without reported diagnosis    Past  Surgical History  Procedure Laterality Date  . Wisdom tooth extraction    . Portacath placement Left 09/20/2012    Procedure: INSERTION PORT-A-CATH;  Surgeon: [name], MD;  Location: [place];  Service: General;  Laterality: Left;  . Port-a-cath removal N/A 04/06/2013    Procedure: REMOVAL PORT-A-CATH;  Surgeon: [name], MD;  Location: [place];  Service: General;  Laterality: N/A;  . Breast surgery      left breast lumpectomy/left ax snbx  . Breast lumpectomy with sentinel lymph node biopsy Right 04/06/2013    [name]   Social History   Social History  . Marital Status: Single    Spouse Name: N/A  . Number of Children: N/A  . Years of Education: N/A   Social History Main Topics  . Smoking status: Never Smoker   . Smokeless tobacco: Never Used  . Alcohol Use: No  . Drug Use: No  . Sexual Activity: No     Comment: menarche age 71, P76,  last menstrual cycle 08/26/2012   Other Topics Concern  . Not on file   Social History Narrative   Family History  Problem Relation Age of Onset  . Pulmonary embolism Maternal Uncle 67    died instantly  . Melanoma Cousin     paternal cousin dx in her 61s  . Heart disease Father   . Diabetes Father   . Thyroid disease  Mother    No Known Allergies Prior to Admission medications   Medication Sig Start Date End Date Taking? Authorizing Provider  amoxicillin (AMOXIL) 875 MG tablet Take 1 tablet (875 mg total) by mouth 2 (two) times daily. 12/28/14   Darlyne Russian, MD  Calcium Carb-Cholecalciferol (CALCIUM PLUS VITAMIN D3) 600-500 MG-UNIT CAPS Take 1 tablet by mouth every other day.     Historical Provider, MD  exemestane (AROMASIN) 25 MG tablet Take 1 tablet (25 mg total) by mouth daily after breakfast. 10/05/14   Nicholas Lose, MD  fluticasone (FLONASE) 50 MCG/ACT nasal spray Place 2 sprays into both nostrils daily. 12/28/14   Darlyne Russian, MD     ROS: The patient denies fevers,  chills, night sweats, unintentional weight loss, chest pain, palpitations, wheezing, dyspnea on exertion, nausea, vomiting, abdominal pain, dysuria, hematuria, melena, numbness, weakness, or tingling.   All other systems have been reviewed and were otherwise negative with the exception of those mentioned in the HPI and as above.    PHYSICAL EXAM: Filed Vitals:   01/25/15 0843  BP: 110/64  Pulse: 71  Temp: 98.1 F (36.7 C)  Resp: 16   Body mass index is 32.58 kg/(m^2).   General: Alert, no acute distress HEENT:  Normocephalic, atraumatic, oropharynx patent. Erythema in nasal pharynx.  Eye: [name] [facility] Cardiovascular:  Regular rate and rhythm, no rubs murmurs or gallops.  No Carotid bruits, radial pulse intact. No pedal edema.  Respiratory: Clear to auscultation bilaterally.  No wheezes, rales, or rhonchi.  No cyanosis, no use of accessory musculature Abdominal: No organomegaly, abdomen is soft and non-tender, positive bowel sounds.  No masses. Musculoskeletal: Gait intact. No edema, tenderness Skin: No rashes. Neurologic: Facial musculature symmetric. Psychiatric: Patient acts appropriately throughout our interaction. Lymphatic: No cervical or submandibular lymphadenopathy    LABS: Results for orders placed or performed in visit on 01/25/15  POCT glucose (manual entry)  Result Value Ref Range   POC Glucose 126 (A) 70 - 99 mg/dl  POCT glycosylated hemoglobin (Hb A1C)  Result Value Ref Range   Hemoglobin A1C 5.1    ASSESSMENT/PLAN: Patient has hyperglycemia but A1c is 5.1 in nondiabetic range. We'll recheck in 6 months. She was given a flu shot today. I did check her thyroid and lipid studies. I gave her a prescription for gabapentin to try for her lower extremity symptoms of neuropathy.I personally performed the services described in this documentation, which was scribed in my presence. The recorded information has been reviewed and is accurate. I advised her to use her  Flonase for allergic symptoms.   Gross sideeffects, risk and benefits, and alternatives of medications d/w patient. Patient is aware that all medications have potential sideeffects and we are unable to predict every sideeffect or drug-drug interaction that may occur.  Arlyss Queen MD 01/25/2015 8:26 AM

## 2015-01-25 NOTE — Patient Instructions (Signed)

## 2015-03-08 ENCOUNTER — Other Ambulatory Visit (HOSPITAL_BASED_OUTPATIENT_CLINIC_OR_DEPARTMENT_OTHER): Payer: BLUE CROSS/BLUE SHIELD

## 2015-03-08 DIAGNOSIS — C50511 Malignant neoplasm of lower-outer quadrant of right female breast: Secondary | ICD-10-CM | POA: Diagnosis not present

## 2015-03-08 LAB — COMPREHENSIVE METABOLIC PANEL
ALT: 19 U/L (ref 0–55)
ANION GAP: 12 meq/L — AB (ref 3–11)
AST: 14 U/L (ref 5–34)
Albumin: 4 g/dL (ref 3.5–5.0)
Alkaline Phosphatase: 112 U/L (ref 40–150)
BUN: 12.6 mg/dL (ref 7.0–26.0)
CO2: 26 meq/L (ref 22–29)
Calcium: 9.3 mg/dL (ref 8.4–10.4)
Chloride: 102 mEq/L (ref 98–109)
Creatinine: 0.8 mg/dL (ref 0.6–1.1)
EGFR: >90 mL/min/{1.73_m2} (ref >90)
Glucose: 189 mg/dl — ABNORMAL HIGH (ref 70–140)
Potassium: 4.2 mEq/L (ref 3.5–5.1)
Sodium: 141 mEq/L (ref 136–145)
Total Bilirubin: 1.45 mg/dL — ABNORMAL HIGH (ref 0.20–1.20)
Total Protein: 7.8 g/dL (ref 6.4–8.3)

## 2015-03-08 LAB — CBC WITH DIFFERENTIAL/PLATELET
BASO%: 0.4 % (ref 0.0–2.0)
Basophils Absolute: 0.1 10*3/uL (ref 0.0–0.1)
EOS%: 2 % (ref 0.0–7.0)
Eosinophils Absolute: 0.2 10*3/uL (ref 0.0–0.5)
HCT: 31.6 % — ABNORMAL LOW (ref 34.8–46.6)
HEMOGLOBIN: 11 g/dL — AB (ref 11.6–15.9)
LYMPH#: 2.2 10*3/uL (ref 0.9–3.3)
LYMPH%: 19.4 % (ref 14.0–49.7)
MCH: 29 pg (ref 25.1–34.0)
MCHC: 34.8 g/dL (ref 31.5–36.0)
MCV: 83.4 fL (ref 79.5–101.0)
MONO#: 0.6 10*3/uL (ref 0.1–0.9)
MONO%: 5.3 % (ref 0.0–14.0)
NEUT#: 8.2 10*3/uL — ABNORMAL HIGH (ref 1.5–6.5)
NEUT%: 72.9 % (ref 38.4–76.8)
Platelets: 220 10*3/uL (ref 145–400)
RBC: 3.79 10*6/uL (ref 3.70–5.45)
RDW: 18.7 % — AB (ref 11.2–14.5)
WBC: 11.2 10*3/uL — ABNORMAL HIGH (ref 3.9–10.3)

## 2015-03-17 NOTE — Assessment & Plan Note (Signed)
Right breast invasive ductal carcinoma ER/PR positive HER-2 negative stage II A. T3, N1, M0 with PTEN mutation Current treatment: Adjuvant Zoladex and exemestane started 07/03/2013, plan is for 5 years; changed to every 3 month Zoladex injection from May 2016  Toxicities of therapy: 1. Hot flashes : improving 2. Injection site pain patient uses ice cube prior to injection but I instructed her to use EMLA cream  Surveillance for breast cancer: 1. Breast exam 12/10/14 normal 2. Mammogram August 2016 normal, Density cat B 3. MRI January 2015 revealed changes in the right breast, most likely postsurgical in nature 4. Bone density done July 2015 was normal next bone density to be done July 2017  Anemia due to malabsorption versus Spherocytosis: Monitoring hemoglobin levels. Hemoglobin is 11.5.  Return to clinic in 6 months for follow-up and q 3 months for Zoladex injections She will get labs one week ahead of the next visit 

## 2015-03-18 ENCOUNTER — Ambulatory Visit (HOSPITAL_BASED_OUTPATIENT_CLINIC_OR_DEPARTMENT_OTHER): Payer: BLUE CROSS/BLUE SHIELD | Admitting: Hematology and Oncology

## 2015-03-18 ENCOUNTER — Ambulatory Visit (HOSPITAL_BASED_OUTPATIENT_CLINIC_OR_DEPARTMENT_OTHER): Payer: BLUE CROSS/BLUE SHIELD

## 2015-03-18 VITALS — BP 116/62 | HR 98 | Temp 98.0°F | Resp 18 | Ht 65.25 in | Wt 202.0 lb

## 2015-03-18 DIAGNOSIS — D649 Anemia, unspecified: Secondary | ICD-10-CM

## 2015-03-18 DIAGNOSIS — Z170 Estrogen receptor positive status [ER+]: Secondary | ICD-10-CM

## 2015-03-18 DIAGNOSIS — Z5111 Encounter for antineoplastic chemotherapy: Secondary | ICD-10-CM

## 2015-03-18 DIAGNOSIS — N951 Menopausal and female climacteric states: Secondary | ICD-10-CM

## 2015-03-18 DIAGNOSIS — C50511 Malignant neoplasm of lower-outer quadrant of right female breast: Secondary | ICD-10-CM

## 2015-03-18 MED ORDER — GOSERELIN ACETATE 10.8 MG ~~LOC~~ IMPL
10.8000 mg | DRUG_IMPLANT | SUBCUTANEOUS | Status: DC
  Administered 2015-03-18: 10.8 mg via SUBCUTANEOUS
  Filled 2015-03-18: qty 10.8

## 2015-03-18 NOTE — Progress Notes (Signed)
Patient Care Team: Darlyne Russian, MD as PCP - General (Family Medicine) Nicholas Lose, MD as Consulting Physician (Hematology and Oncology)  DIAGNOSIS: Breast cancer of lower-outer quadrant of right female breast [facility])   Staging form: Breast, AJCC 7th Edition     Clinical: No stage assigned - Unsigned     Pathologic: Stage IIA (T3, N1, cM0) - Unsigned       Staging comments: EP+PR+ Her2Neu-  SUMMARY OF ONCOLOGIC HISTORY:   Breast cancer of lower-outer quadrant of right female breast ([place])   09/08/2012 Initial Diagnosis Breast cancer of lower-outer quadrant of right female breast: ER 99% PR 100% HER-2 negative, Ki-67 70%   10/07/2012 - 03/17/2013 Neo-Adjuvant Chemotherapy Dose dense FEC followed by weekly Taxol   04/06/2013 Surgery Right breast lumpectomy residual 1.1 cm IDC grade 3 with high-grade DCIS, PNI +ve, 1/2 SLN positive, no MVI, T1 C. N1 A. stage IIA, ER 97% PR 95% HER-2 1.1 ratio negative   05/08/2013 - 06/22/2013 Radiation Therapy Radiation therapy to lumpectomy site   07/03/2013 -  Anti-estrogen oral therapy Zoladex plus Aromasin    Procedure Genetic testing:PTEN C.-([id number])del 26 on [employer] testing    CHIEF COMPLIANT: Follow-up on Zoladex plus Aromasin  INTERVAL HISTORY: Mary Dougherty is a 45 year old with above-mentioned history of right breast cancer currently on antiestrogen therapy with Zoladex plus Aromasin. Apart from injection site discomfort she does not have any side effects from the treatment. Recently she went on a cruise trip to [place]. She has not a great memories and pictures show [place].   REVIEW OF SYSTEMS:   Constitutional: Denies fevers, chills or abnormal weight loss Eyes: Denies blurriness of vision Ears, nose, mouth, throat, and face: Denies mucositis or sore throat Respiratory: Denies cough, dyspnea or wheezes Cardiovascular: Denies palpitation, chest discomfort Gastrointestinal:  Denies nausea, heartburn or change in bowel habits Skin: Denies abnormal  skin rashes Lymphatics: Denies new lymphadenopathy or easy bruising Neurological:Denies numbness, tingling or new weaknesses Behavioral/Psych: Mood is stable, no new changes  Extremities: No lower extremity edema Breast:  denies any pain or lumps or nodules in either breasts All other systems were reviewed with the patient and are negative.  I have reviewed the past medical history, past surgical history, social history and family history with the patient and they are unchanged from previous note.  ALLERGIES:  has No Known Allergies.  MEDICATIONS:  Current Outpatient Prescriptions  Medication Sig Dispense Refill  . Calcium Carb-Cholecalciferol (CALCIUM PLUS VITAMIN D3) 600-500 MG-UNIT CAPS Take 1 tablet by mouth every other day.     . exemestane (AROMASIN) 25 MG tablet Take 1 tablet (25 mg total) by mouth daily after breakfast. 90 tablet 3  . fluticasone (FLONASE) 50 MCG/ACT nasal spray Place 2 sprays into both nostrils daily. (Patient not taking: Reported on 01/25/2015) 16 g 6  . gabapentin (NEURONTIN) 100 MG capsule Take 1-2 capsules at night. One capsule can be taken during the day up to twice a day as needed. 90 capsule 3   No current facility-administered medications for this visit.    PHYSICAL EXAMINATION: ECOG PERFORMANCE STATUS: 1 - Symptomatic but completely ambulatory  Filed Vitals:   03/18/15 0922  BP: 116/62  Pulse: 98  Temp: 98 F (36.7 C)  Resp: 18   Filed Weights   03/18/15 0922  Weight: 202 lb (91.627 kg)    GENERAL:alert, no distress and comfortable SKIN: skin color, texture, turgor are normal, no rashes or significant lesions EYES: normal, Conjunctiva are pink and  non-injected, sclera clear OROPHARYNX:no exudate, no erythema and lips, buccal mucosa, and tongue normal  NECK: supple, thyroid normal size, non-tender, without nodularity LYMPH:  no palpable lymphadenopathy in the cervical, axillary or inguinal LUNGS: clear to auscultation and percussion with  normal breathing effort HEART: regular rate & rhythm and no murmurs and no lower extremity edema ABDOMEN:abdomen soft, non-tender and normal bowel sounds MUSCULOSKELETAL:no cyanosis of digits and no clubbing  NEURO: alert & oriented x 3 with fluent speech, no focal motor/sensory deficits EXTREMITIES: No lower extremity edema  LABORATORY DATA:  I have reviewed the data as listed   Chemistry      Component Value Date/Time   NA 141 03/08/2015 1544   NA 139 01/25/2015 0859   K 4.2 03/08/2015 1544   K 4.2 01/25/2015 0859   CL 106 01/25/2015 0859   CO2 26 03/08/2015 1544   CO2 27 01/25/2015 0859   BUN 12.6 03/08/2015 1544   BUN 11 01/25/2015 0859   CREATININE 0.8 03/08/2015 1544   CREATININE 0.54 01/25/2015 0859   CREATININE 0.60 10/26/2012 0918   GLU 124* 03/10/2013 1438      Component Value Date/Time   CALCIUM 9.3 03/08/2015 1544   CALCIUM 9.1 01/25/2015 0859   ALKPHOS 112 03/08/2015 1544   ALKPHOS 103 01/25/2015 0859   AST 14 03/08/2015 1544   AST 16 01/25/2015 0859   ALT 19 03/08/2015 1544   ALT 21 01/25/2015 0859   BILITOT 1.45* 03/08/2015 1544   BILITOT 1.0 01/25/2015 0859       Lab Results  Component Value Date   WBC 11.2* 03/08/2015   HGB 11.0* 03/08/2015   HCT 31.6* 03/08/2015   MCV 83.4 03/08/2015   PLT 220 03/08/2015   NEUTROABS 8.2* 03/08/2015   ASSESSMENT & PLAN:  Breast cancer of lower-outer quadrant of right female breast Right breast invasive ductal carcinoma ER/PR positive HER-2 negative stage II A. T3, N1, M0 with PTEN mutation Current treatment: Adjuvant Zoladex and exemestane started 07/03/2013, plan is for 5 years; changed to every 3 month Zoladex injection from May 2016  Toxicities of therapy: 1. Hot flashes : improving 2. Injection site pain patient uses ice cube prior to injection but I instructed her to use EMLA cream  Surveillance for breast cancer: 1. Breast exam 12/10/14 normal 2. Mammogram August 2016 normal, Density cat B 3. MRI  January 2015 revealed changes in the right breast, most likely postsurgical in nature 4. Bone density done July 2015 was normal next bone density to be done July 2017  Anemia due to malabsorption versus Spherocytosis: Monitoring hemoglobin levels. Hemoglobin is 11.   Return to clinic in 6 months for follow-up and q 3 months for Zoladex injections  No orders of the defined types were placed in this encounter.   The patient has a good understanding of the overall plan. she agrees with it. she will call with any problems that may develop before the next visit here.   Rulon Eisenmenger, MD 03/18/2015

## 2015-03-19 ENCOUNTER — Telehealth: Payer: Self-pay | Admitting: Hematology and Oncology

## 2015-03-19 LAB — ESTRADIOL, ULTRA SENS

## 2015-03-19 NOTE — Telephone Encounter (Signed)
Left message to inform patient of next appointments dates/times per MD 2/13 pof

## 2015-04-10 ENCOUNTER — Telehealth: Payer: Self-pay | Admitting: Hematology and Oncology

## 2015-04-10 NOTE — Telephone Encounter (Signed)
pt cld left voice mail-cld pt back and left message unable to reach

## 2015-04-12 ENCOUNTER — Telehealth: Payer: Self-pay | Admitting: Hematology and Oncology

## 2015-04-12 NOTE — Telephone Encounter (Signed)
Returned patient's call re an appointment she has on schedule for 3/13 that is not needed. Per patient see is not to return until May and then August. Checked last pof from 2/13 and injection is now q61mos not monthly. Cancelled injections for March and April. Left message for patient re changes - confirmed appointments for 5/15 and 8/14. Scheduled mailed.

## 2015-04-12 NOTE — Telephone Encounter (Signed)
Returned patient call re when chemo would be done next week. Patient scheduled for lab/flush/VG/tx 3/15 @ 11:45 am. Left message for patient confirming 3/15 appointment - patient will get new schedule 3/15.

## 2015-04-15 ENCOUNTER — Ambulatory Visit: Payer: BLUE CROSS/BLUE SHIELD

## 2015-05-13 ENCOUNTER — Ambulatory Visit: Payer: BLUE CROSS/BLUE SHIELD

## 2015-06-07 ENCOUNTER — Ambulatory Visit (INDEPENDENT_AMBULATORY_CARE_PROVIDER_SITE_OTHER): Payer: BLUE CROSS/BLUE SHIELD | Admitting: Physician Assistant

## 2015-06-07 VITALS — BP 130/70 | HR 92 | Temp 98.0°F | Resp 15 | Ht 65.0 in | Wt 203.0 lb

## 2015-06-07 DIAGNOSIS — J069 Acute upper respiratory infection, unspecified: Secondary | ICD-10-CM

## 2015-06-07 MED ORDER — GUAIFENESIN ER 1200 MG PO TB12: 1.0000 | ORAL_TABLET | Freq: Two times a day (BID) | ORAL | Status: DC | PRN

## 2015-06-07 NOTE — Progress Notes (Signed)
[facility] [address] [place] [address] [phone]  Date:  06/07/2015   Name:  Mary Dougherty   DOB:  [birth year]   MRN:  [id number]  PCP:  Jenny Reichmann, MD    Chief Complaint: Sinusitis   History of Present Illness:  This is a 45 y.o. female with PMH HS, genital herpes, hx breast cancer who is presenting with 3 days of sore throat and runny nose. Ears are hurting a little bit. She states now I am having "infection" - when she spits up mucus it's green. Mild cough, no sob or wheezing. Most of symptoms are in her head. No fever or chills. Tried OTC mucinex - some help. No hx asthma. Does not have many problems with allergies. Not a smoker.  She states she likes to come in and catch her illnesses early. States if she gets sick her white cells start attacking her red cells with her hx of hereditary spherocytosis.  Review of Systems:  Review of Systems See HPI  Patient Active Problem List   Diagnosis Date Noted  . HS (hereditary spherocytosis) ([place]) 09/20/2013  . Anemia, unspecified 01/27/2013  . Syncope, vasovagal 01/17/2013    Class: Chronic  . Genital herpes 10/14/2012  . Breast cancer of lower-outer quadrant of right female breast ([place]) 09/08/2012    Prior to Admission medications   Medication Sig Start Date End Date Taking? Authorizing Provider  Calcium Carb-Cholecalciferol (CALCIUM PLUS VITAMIN D3) 600-500 MG-UNIT CAPS Take 1 tablet by mouth every other day.    Yes Historical Provider, MD  exemestane (AROMASIN) 25 MG tablet Take 1 tablet (25 mg total) by mouth daily after breakfast. 10/05/14  Yes Nicholas Lose, MD  fluticasone (FLONASE) 50 MCG/ACT nasal spray Place 2 sprays into both nostrils daily. Patient not taking: Reported on 01/25/2015 12/28/14   Darlyne Russian, MD  gabapentin (NEURONTIN) 100 MG capsule Take 1-2 capsules at night. One capsule can be taken during the day up to twice a day as needed. Patient not taking: Reported on  03/18/2015 01/25/15   Darlyne Russian, MD    No Known Allergies  Past Surgical History  Procedure Laterality Date  . Wisdom tooth extraction    . Portacath placement Left 09/20/2012    Procedure: INSERTION PORT-A-CATH;  Surgeon: [name], MD;  Location: [place];  Service: General;  Laterality: Left;  . Port-a-cath removal N/A 04/06/2013    Procedure: REMOVAL PORT-A-CATH;  Surgeon: [name], MD;  Location: [facility];  Service: General;  Laterality: N/A;  . Breast surgery      left breast lumpectomy/left ax snbx  . Breast lumpectomy with sentinel lymph node biopsy Right 04/06/2013    [name]    Social History  Substance Use Topics  . Smoking status: Never Smoker   . Smokeless tobacco: Never Used  . Alcohol Use: No    Family History  Problem Relation Age of Onset  . Pulmonary embolism Maternal Uncle 32    died instantly  . Melanoma Cousin     paternal cousin dx in her 38s  . Heart disease Father   . Diabetes Father   . Thyroid disease Mother     Medication list has been reviewed and updated.  Physical Examination:  Physical Exam  Constitutional: She is oriented to person, place, and time. She appears well-developed and well-nourished. No distress.  HENT:  Head: Normocephalic and atraumatic.  Right Ear: Hearing, external ear and  ear canal normal. A middle ear effusion is present.  Left Ear: Hearing, external ear and ear canal normal. A middle ear effusion is present.  Nose: Right sinus exhibits maxillary sinus tenderness (mild). Right sinus exhibits no frontal sinus tenderness. Left sinus exhibits maxillary sinus tenderness (mild). Left sinus exhibits no frontal sinus tenderness.  Mouth/Throat: Uvula is midline and mucous membranes are normal. Posterior oropharyngeal erythema present. No oropharyngeal exudate or posterior oropharyngeal edema.  Eyes: Conjunctivae and lids are normal. Right eye exhibits no discharge. Left  eye exhibits no discharge. No scleral icterus.  Cardiovascular: Normal rate, regular rhythm, normal heart sounds and normal pulses.   No murmur heard. Pulmonary/Chest: Effort normal and breath sounds normal. No respiratory distress. She has no wheezes. She has no rhonchi. She has no rales.  Musculoskeletal: Normal range of motion.  Lymphadenopathy:       Head (right side): No submental, no submandibular and no tonsillar adenopathy present.       Head (left side): No submental, no submandibular and no tonsillar adenopathy present.    She has no cervical adenopathy.  Neurological: She is alert and oriented to person, place, and time.  Skin: Skin is warm, dry and intact. No lesion and no rash noted.  Psychiatric: She has a normal mood and affect. Her speech is normal and behavior is normal. Thought content normal.   BP 130/70 mmHg  Pulse 92  Temp(Src) 98 F (36.7 C) (Oral)  Resp 15  Ht 5\' 5"  (1.651 m)  Wt 203 lb (92.08 kg)  BMI 33.78 kg/m2  SpO2 95%  Assessment and Plan:  1. Viral URI Likely viral URI. Will try to treat conservatively at this time with mucinex and afrin. If not getting better in 3-4 days, she will call and I will prescribe abx at that time d/t hx hereditary spherocytosis and problems with hemolytic anemic when sick. Return if symptoms get much worse in the meantime. - Guaifenesin (MUCINEX MAXIMUM STRENGTH) 1200 MG TB12; Take 1 tablet (1,200 mg total) by mouth every 12 (twelve) hours as needed.  Dispense: 14 tablet; Refill: 1   Isra Lindy V. Drenda Freeze, MHS [facility]  06/07/2015

## 2015-06-07 NOTE — Patient Instructions (Addendum)
If prescribed mucinex, take twice a day with plenty of water (64 oz per day). Use afrin twice a day for MAX 3 days, then stop. Hot showers, breathing in steam from shower or pot of boiling onions, eating spicy food and neti pot with sterile water can all help your sinuses drain. If you are not getting better in 3-4 days, let me know.     IF you received an x-ray today, you will receive an invoice from [facility]. Please contact [facility] at [phone] with questions or concerns regarding your invoice.   IF you received labwork today, you will receive an invoice from [employer]. Please contact [facility] at [phone] with questions or concerns regarding your invoice.   Our billing staff will not be able to assist you with questions regarding bills from these companies.  You will be contacted with the lab results as soon as they are available. The fastest way to get your results is to activate your My Chart account. Instructions are located on the last page of this paperwork. If you have not heard from [place] regarding the results in 2 weeks, please contact this office.

## 2015-06-13 ENCOUNTER — Telehealth: Payer: Self-pay

## 2015-06-13 DIAGNOSIS — J019 Acute sinusitis, unspecified: Secondary | ICD-10-CM

## 2015-06-13 NOTE — Telephone Encounter (Signed)
Pt was told to call if she is not any better to call for an antibodic  Best number [phone]

## 2015-06-13 NOTE — Telephone Encounter (Signed)
She wants the medicine sent into the [facility] on [address] in [place]

## 2015-06-13 NOTE — Telephone Encounter (Signed)
Patient Instructions     If prescribed mucinex, take twice a day with plenty of water (64 oz per day). Use afrin twice a day for MAX 3 days, then stop. Hot showers, breathing in steam from shower or pot of boiling onions, eating spicy food and neti pot with sterile water can all help your sinuses drain. If you are not getting better in 3-4 days, let me know

## 2015-06-14 MED ORDER — AMOXICILLIN-POT CLAVULANATE 875-125 MG PO TABS: 1.0000 | ORAL_TABLET | Freq: Two times a day (BID) | ORAL | Status: AC

## 2015-06-14 NOTE — Telephone Encounter (Signed)
Please call pt and let her know I have sent in rx for augmentin. She should take twice a day for 10 days. Return if symptoms worsen at any time. Also remind patient it is always a good idea to eat yogurt or take probiotic daily while on abx to protect gut.

## 2015-06-14 NOTE — Telephone Encounter (Signed)
Called pt, no VM. 

## 2015-06-17 ENCOUNTER — Ambulatory Visit (HOSPITAL_BASED_OUTPATIENT_CLINIC_OR_DEPARTMENT_OTHER): Payer: BLUE CROSS/BLUE SHIELD

## 2015-06-17 VITALS — BP 150/62 | HR 97 | Temp 98.3°F

## 2015-06-17 DIAGNOSIS — Z5111 Encounter for antineoplastic chemotherapy: Secondary | ICD-10-CM

## 2015-06-17 DIAGNOSIS — C50511 Malignant neoplasm of lower-outer quadrant of right female breast: Secondary | ICD-10-CM | POA: Diagnosis not present

## 2015-06-17 MED ORDER — GOSERELIN ACETATE 10.8 MG ~~LOC~~ IMPL
10.8000 mg | DRUG_IMPLANT | Freq: Once | SUBCUTANEOUS | Status: AC
  Administered 2015-06-17: 10.8 mg via SUBCUTANEOUS
  Filled 2015-06-17: qty 10.8

## 2015-06-17 NOTE — Telephone Encounter (Signed)
Per patient no longer need medication and will not get filled

## 2015-09-16 ENCOUNTER — Ambulatory Visit (HOSPITAL_BASED_OUTPATIENT_CLINIC_OR_DEPARTMENT_OTHER): Payer: BLUE CROSS/BLUE SHIELD | Admitting: Hematology and Oncology

## 2015-09-16 ENCOUNTER — Encounter: Payer: Self-pay | Admitting: Hematology and Oncology

## 2015-09-16 ENCOUNTER — Telehealth: Payer: Self-pay | Admitting: Hematology and Oncology

## 2015-09-16 ENCOUNTER — Ambulatory Visit (HOSPITAL_BASED_OUTPATIENT_CLINIC_OR_DEPARTMENT_OTHER): Payer: BLUE CROSS/BLUE SHIELD

## 2015-09-16 DIAGNOSIS — Z5111 Encounter for antineoplastic chemotherapy: Secondary | ICD-10-CM | POA: Diagnosis not present

## 2015-09-16 DIAGNOSIS — C50511 Malignant neoplasm of lower-outer quadrant of right female breast: Secondary | ICD-10-CM

## 2015-09-16 DIAGNOSIS — N951 Menopausal and female climacteric states: Secondary | ICD-10-CM

## 2015-09-16 DIAGNOSIS — Z170 Estrogen receptor positive status [ER+]: Secondary | ICD-10-CM

## 2015-09-16 DIAGNOSIS — D649 Anemia, unspecified: Secondary | ICD-10-CM | POA: Diagnosis not present

## 2015-09-16 DIAGNOSIS — Z79811 Long term (current) use of aromatase inhibitors: Secondary | ICD-10-CM

## 2015-09-16 MED ORDER — GOSERELIN ACETATE 10.8 MG ~~LOC~~ IMPL
10.8000 mg | DRUG_IMPLANT | Freq: Once | SUBCUTANEOUS | Status: AC
  Administered 2015-09-16: 10.8 mg via SUBCUTANEOUS
  Filled 2015-09-16: qty 10.8

## 2015-09-16 NOTE — Patient Instructions (Signed)
Goserelin injection What is this medicine? GOSERELIN (GOE se rel in) is similar to a hormone found in the body. It lowers the amount of sex hormones that the body makes. Men will have lower testosterone levels and women will have lower estrogen levels while taking this medicine. In men, this medicine is used to treat prostate cancer; the injection is either given once per month or once every 12 weeks. A once per month injection (only) is used to treat women with endometriosis, dysfunctional uterine bleeding, or advanced breast cancer. This medicine may be used for other purposes; ask your health care provider or pharmacist if you have questions. COMMON BRAND NAME(S): Zoladex What should I tell my health care provider before I take this medicine? They need to know if you have any of these conditions (some only apply to women): -diabetes -heart disease or previous heart attack -high blood pressure -high cholesterol -kidney disease -osteoporosis or low bone density -problems passing urine -spinal cord injury -stroke -tobacco smoker -an unusual or allergic reaction to goserelin, hormone therapy, other medicines, foods, dyes, or preservatives -pregnant or trying to get pregnant -breast-feeding How should I use this medicine? This medicine is for injection under the skin. It is given by a health care professional in a hospital or clinic setting. Men receive this injection once every 4 weeks or once every 12 weeks. Women will only receive the once every 4 weeks injection. Talk to your pediatrician regarding the use of this medicine in children. Special care may be needed. Overdosage: If you think you have taken too much of this medicine contact a poison control center or emergency room at once. NOTE: This medicine is only for you. Do not share this medicine with others. What if I miss a dose? It is important not to miss your dose. Call your doctor or health care professional if you are unable to  keep an appointment. What may interact with this medicine? -female hormones like estrogen -herbal or dietary supplements like black cohosh, chasteberry, or DHEA -female hormones like testosterone -prasterone This list may not describe all possible interactions. Give your health care provider a list of all the medicines, herbs, non-prescription drugs, or dietary supplements you use. Also tell them if you smoke, drink alcohol, or use illegal drugs. Some items may interact with your medicine. What should I watch for while using this medicine? Visit your doctor or health care professional for regular checks on your progress. Your symptoms may appear to get worse during the first weeks of this therapy. Tell your doctor or healthcare professional if your symptoms do not start to get better or if they get worse after this time. Your bones may get weaker if you take this medicine for a long time. If you smoke or frequently drink alcohol you may increase your risk of bone loss. A family history of osteoporosis, chronic use of drugs for seizures (convulsions), or corticosteroids can also increase your risk of bone loss. Talk to your doctor about how to keep your bones strong. This medicine should stop regular monthly menstration in women. Tell your doctor if you continue to menstrate. Women should not become pregnant while taking this medicine or for 12 weeks after stopping this medicine. Women should inform their doctor if they wish to become pregnant or think they might be pregnant. There is a potential for serious side effects to an unborn child. Talk to your health care professional or pharmacist for more information. Do not breast-feed an infant while taking   this medicine. Men should inform their doctors if they wish to father a child. This medicine may lower sperm counts. Talk to your health care professional or pharmacist for more information. What side effects may I notice from receiving this  medicine? Side effects that you should report to your doctor or health care professional as soon as possible: -allergic reactions like skin rash, itching or hives, swelling of the face, lips, or tongue -bone pain -breathing problems -changes in vision -chest pain -feeling faint or lightheaded, falls -fever, chills -pain, swelling, warmth in the leg -pain, tingling, numbness in the hands or feet -signs and symptoms of low blood pressure like dizziness; feeling faint or lightheaded, falls; unusually weak or tired -stomach pain -swelling of the ankles, feet, hands -trouble passing urine or change in the amount of urine -unusually high or low blood pressure -unusually weak or tired Side effects that usually do not require medical attention (report to your doctor or health care professional if they continue or are bothersome): -change in sex drive or performance -changes in breast size in both males and females -changes in emotions or moods -headache -hot flashes -irritation at site where injected -loss of appetite -skin problems like acne, dry skin -vaginal dryness This list may not describe all possible side effects. Call your doctor for medical advice about side effects. You may report side effects to FDA at 1-800-FDA-1088. Where should I keep my medicine? This drug is given in a hospital or clinic and will not be stored at home. NOTE: This sheet is a summary. It may not cover all possible information. If you have questions about this medicine, talk to your doctor, pharmacist, or health care provider.  2015, [employer]. (2013-03-28 11:10:35)  

## 2015-09-16 NOTE — Progress Notes (Signed)
Patient Care Team: Darlyne Russian, MD as PCP - General (Family Medicine) Nicholas Lose, MD as Consulting Physician (Hematology and Oncology)  DIAGNOSIS: Breast cancer of lower-outer quadrant of right female breast [facility])   Staging form: Breast, AJCC 7th Edition   - Clinical: No stage assigned - Unsigned   - Pathologic: Stage IIA (T3, N1, cM0) - Unsigned         Staging comments: EP+PR+ Her2Neu-   SUMMARY OF ONCOLOGIC HISTORY:   Breast cancer of lower-outer quadrant of right female breast ([place])   09/08/2012 Initial Diagnosis    Breast cancer of lower-outer quadrant of right female breast: ER 99% PR 100% HER-2 negative, Ki-67 70%     10/07/2012 - 03/17/2013 Neo-Adjuvant Chemotherapy    Dose dense FEC followed by weekly Taxol     04/06/2013 Surgery    Right breast lumpectomy residual 1.1 cm IDC grade 3 with high-grade DCIS, PNI +ve, 1/2 SLN positive, no MVI, T1 C. N1 A. stage IIA, ER 97% PR 95% HER-2 1.1 ratio negative     05/08/2013 - 06/22/2013 Radiation Therapy    Radiation therapy to lumpectomy site     07/03/2013 -  Anti-estrogen oral therapy    Zoladex plus Aromasin      Procedure    Genetic testing:PTEN C.-([id number])del 26 on [employer] testing      CHIEF COMPLIANT: Follow-up on Zoladex plus Aromasin  INTERVAL HISTORY: ISLAND DOHMEN Mary a 45 year Dougherty with above-mentioned history of right breast cancer currently on antiestrogen therapy with Zoladex and Aromasin. She Mary tolerating it fairly well. She does some mild hot flashes injection site discomfort. She denies any new problems or concerns. She does have occasional vertigo symptoms are especially after she lies down. Denies any headache blurred vision by tenderness of neurological symptoms. Denies any lumps or nodules breasts.  REVIEW OF SYSTEMS:   Constitutional: Denies fevers, chills or abnormal weight loss Eyes: Denies blurriness of vision Ears, nose, mouth, throat, and face: Denies mucositis or sore throat Respiratory:  Denies cough, dyspnea or wheezes Cardiovascular: Denies palpitation, chest discomfort Gastrointestinal:  Denies nausea, heartburn or change in bowel habits Skin: Denies abnormal skin rashes Lymphatics: Denies new lymphadenopathy or easy bruising Neurological:Denies numbness, tingling or new weaknesses Behavioral/Psych: Mood Mary stable, no new changes  Extremities: No lower extremity edema Breast:  denies any pain or lumps or nodules in either breasts All other systems were reviewed with the patient and are negative.  I have reviewed the past medical history, past surgical history, social history and family history with the patient and they are unchanged from previous note.  ALLERGIES:  has No Known Allergies.  MEDICATIONS:  Current Outpatient Prescriptions  Medication Sig Dispense Refill  . Calcium Carb-Cholecalciferol (CALCIUM PLUS VITAMIN D3) 600-500 MG-UNIT CAPS Take 1 tablet by mouth every other day.     . exemestane (AROMASIN) 25 MG tablet Take 1 tablet (25 mg total) by mouth daily after breakfast. 90 tablet 3  . fluticasone (FLONASE) 50 MCG/ACT nasal spray Place 2 sprays into both nostrils daily. (Patient not taking: Reported on 01/25/2015) 16 g 6  . gabapentin (NEURONTIN) 100 MG capsule Take 1-2 capsules at night. One capsule can be taken during the day up to twice a day as needed. (Patient not taking: Reported on 03/18/2015) 90 capsule 3  . Guaifenesin (MUCINEX MAXIMUM STRENGTH) 1200 MG TB12 Take 1 tablet (1,200 mg total) by mouth every 12 (twelve) hours as needed. 14 tablet 1   No current facility-administered medications for  this visit.     PHYSICAL EXAMINATION: ECOG PERFORMANCE STATUS: 1 - Symptomatic but completely ambulatory  Vitals:   09/16/15 0907  BP: 134/74  Pulse: 79  Resp: 18  Temp: 98.6 F (37 C)   Filed Weights   09/16/15 0907  Weight: 202 lb 14.4 oz (92 kg)    GENERAL:alert, no distress and comfortable SKIN: skin color, texture, turgor are normal, no  rashes or significant lesions EYES: normal, Conjunctiva are pink and non-injected, sclera clear OROPHARYNX:no exudate, no erythema and lips, buccal mucosa, and tongue normal  NECK: supple, thyroid normal size, non-tender, without nodularity LYMPH:  no palpable lymphadenopathy in the cervical, axillary or inguinal LUNGS: clear to auscultation and percussion with normal breathing effort HEART: regular rate & rhythm and no murmurs and no lower extremity edema ABDOMEN:abdomen soft, non-tender and normal bowel sounds MUSCULOSKELETAL:no cyanosis of digits and no clubbing  NEURO: alert & oriented x 3 with fluent speech, no focal motor/sensory deficits EXTREMITIES: No lower extremity edema BREAST: No palpable masses or nodules in either right or left breasts. No palpable axillary supraclavicular or infraclavicular adenopathy no breast tenderness or nipple discharge. (exam performed in the presence of a chaperone)  LABORATORY DATA:  I have reviewed the data as listed   Chemistry      Component Value Date/Time   NA 141 03/08/2015 1544   K 4.2 03/08/2015 1544   CL 106 01/25/2015 0859   CO2 26 03/08/2015 1544   BUN 12.6 03/08/2015 1544   CREATININE 0.8 03/08/2015 1544   GLU 124 (H) 03/10/2013 1438      Component Value Date/Time   CALCIUM 9.3 03/08/2015 1544   ALKPHOS 112 03/08/2015 1544   AST 14 03/08/2015 1544   ALT 19 03/08/2015 1544   BILITOT 1.45 (H) 03/08/2015 1544       Lab Results  Component Value Date   WBC 11.2 (H) 03/08/2015   HGB 11.0 (L) 03/08/2015   HCT 31.6 (L) 03/08/2015   MCV 83.4 03/08/2015   PLT 220 03/08/2015   NEUTROABS 8.2 (H) 03/08/2015     ASSESSMENT & PLAN:  Breast cancer of lower-outer quadrant of right female breast Right breast invasive ductal carcinoma ER/PR positive HER-2 negative stage II A. T3, N1, M0 with PTEN mutation Current treatment: Adjuvant Zoladex and exemestane started 07/03/2013, plan Mary for 5 years; changed to every 3 month Zoladex  injection from May 2016  Toxicities of therapy: 1. Hot flashes : Much better 2. Injection site pain patient uses ice cube prior to injection  Surveillance for breast cancer: 1. Breast exam 09/16/2015 normal 2. Mammogram August 2016 normal, Density cat B, she will call and make an appointment for the next mammogram 3. MRI January 2015 revealed changes in the right breast, most likely postsurgical in nature 4. Bone density done July 2015 was normal next bone density will need to be done this year.  Anemia due to malabsorption versus Spherocytosis: Monitoring hemoglobin levels. Hemoglobin was 11.   Return to clinic in 6 months for follow-up and q 3 months for Zoladex injections.  No orders of the defined types were placed in this encounter.  The patient has a good understanding of the overall plan. she agrees with it. she will call with any problems that may develop before the next visit here.   Rulon Eisenmenger, MD 09/16/15

## 2015-09-16 NOTE — Assessment & Plan Note (Signed)
Right breast invasive ductal carcinoma ER/PR positive HER-2 negative stage II A. T3, N1, M0 with PTEN mutation Current treatment: Adjuvant Zoladex and exemestane started 07/03/2013, plan is for 5 years; changed to every 3 month Zoladex injection from May 2016  Toxicities of therapy: 1. Hot flashes : improving 2. Injection site pain patient uses ice cube prior to injection  Surveillance for breast cancer: 1. Breast exam 09/16/2015 normal 2. Mammogram August 2016 normal, Density cat B 3. MRI January 2015 revealed changes in the right breast, most likely postsurgical in nature 4. Bone density done July 2015 was normal next bone density will need to be done this year.  Anemia due to malabsorption versus Spherocytosis: Monitoring hemoglobin levels. Hemoglobin is 11.   Return to clinic in 6 months for follow-up and q 3 months for Zoladex injections.

## 2015-09-16 NOTE — Telephone Encounter (Signed)
appt made and avs printed °

## 2015-10-10 ENCOUNTER — Other Ambulatory Visit: Payer: Self-pay | Admitting: Hematology and Oncology

## 2015-10-10 DIAGNOSIS — C50511 Malignant neoplasm of lower-outer quadrant of right female breast: Secondary | ICD-10-CM

## 2015-12-04 ENCOUNTER — Emergency Department (HOSPITAL_COMMUNITY)
Admission: EM | Admit: 2015-12-04 | Discharge: 2015-12-04 | Disposition: A | Discharge status: Home / Self Care | Payer: BLUE CROSS/BLUE SHIELD | Attending: Emergency Medicine | Admitting: Emergency Medicine

## 2015-12-04 ENCOUNTER — Encounter (HOSPITAL_COMMUNITY): Payer: Self-pay | Admitting: Emergency Medicine

## 2015-12-04 DIAGNOSIS — Z79899 Other long term (current) drug therapy: Secondary | ICD-10-CM | POA: Diagnosis not present

## 2015-12-04 DIAGNOSIS — Z853 Personal history of malignant neoplasm of breast: Secondary | ICD-10-CM | POA: Insufficient documentation

## 2015-12-04 DIAGNOSIS — R55 Syncope and collapse: Secondary | ICD-10-CM | POA: Diagnosis present

## 2015-12-04 DIAGNOSIS — I951 Orthostatic hypotension: Secondary | ICD-10-CM | POA: Insufficient documentation

## 2015-12-04 LAB — CBC WITH DIFFERENTIAL/PLATELET
BASOS ABS: 0 10*3/uL (ref 0.0–0.1)
BASOS PCT: 1 %
EOS PCT: 3 %
Eosinophils Absolute: 0.2 10*3/uL (ref 0.0–0.7)
HCT: 28.3 % — ABNORMAL LOW (ref 36.0–46.0)
Hemoglobin: 9.6 g/dL — ABNORMAL LOW (ref 12.0–15.0)
Lymphocytes Relative: 19 %
Lymphs Abs: 1.4 10*3/uL (ref 0.7–4.0)
MCH: 27.4 pg (ref 26.0–34.0)
MCHC: 33.9 g/dL (ref 30.0–36.0)
MCV: 80.6 fL (ref 78.0–100.0)
MONO ABS: 0.9 10*3/uL (ref 0.1–1.0)
MONOS PCT: 12 %
Neutro Abs: 4.7 10*3/uL (ref 1.7–7.7)
Neutrophils Relative %: 65 %
PLATELETS: 180 10*3/uL (ref 150–400)
RBC: 3.51 MIL/uL — ABNORMAL LOW (ref 3.87–5.11)
RDW: 19.5 % — AB (ref 11.5–15.5)
WBC: 7.2 10*3/uL (ref 4.0–10.5)

## 2015-12-04 LAB — BASIC METABOLIC PANEL
ANION GAP: 8 (ref 5–15)
BUN: 8 mg/dL (ref 6–20)
CALCIUM: 8.6 mg/dL — AB (ref 8.9–10.3)
CO2: 26 mmol/L (ref 22–32)
CREATININE: 0.76 mg/dL (ref 0.44–1.00)
Chloride: 102 mmol/L (ref 101–111)
GLUCOSE: 212 mg/dL — AB (ref 65–99)
Potassium: 3.6 mmol/L (ref 3.5–5.1)
Sodium: 136 mmol/L (ref 135–145)

## 2015-12-04 MED ORDER — SODIUM CHLORIDE 0.9 % IV BOLUS (SEPSIS)
1000.0000 mL | Freq: Once | INTRAVENOUS | Status: AC
  Administered 2015-12-04: 1000 mL via INTRAVENOUS

## 2015-12-04 NOTE — ED Triage Notes (Signed)
Pt here with c/o syncopal episode  Times 2  Leaving the MD office being seen for a sinus infection , pt was  orthostatic with EMS

## 2015-12-04 NOTE — ED Provider Notes (Signed)
[facility] DEPT Provider Note   CSN: [id number] Arrival date & time: 12/04/15  1213     History   Chief Complaint Chief Complaint  Patient presents with  . Loss of Consciousness    HPI Mary Dougherty is a 45 y.o. female.  Patient presents to the emergency department with chief complaint of syncope. She states that she passed out twice at her doctor's office. She went to see her doctor because of a sinus infection. She states that she felt dizzy prior to passing out both times. She denies any other associated symptoms. Denies any chest pain, shortness of breath, numbness, weakness, or tingling. Denies any headache, fever, or chills. She denies any history of heart problems.   The history is provided by the patient. No language interpreter was used.    Past Medical History:  Diagnosis Date  . Anemia   . Blood transfusion without reported diagnosis   . Chronic anemia 03/03/13  . Hereditary spherocytosis ([place])   . Invasive ductal carcinoma of right breast ([place]) 09/2012   ER(99%), PR(100%), Ki-67(70%), 1/5 nodes positive  . S/P radiation therapy 05/08/2013-06/22/2013   1) Right breast / 50.4 Gy in 28 fractions / 2) Right Supraclavicular fossa/ 50.4 Gy in 28 fractions /3) Right Posterior Axillary boost / 11.9 Gy in 28 fractions /4) Right breast boost / 10 Gy in 5 fractions   . Spherocytosis ([place]) 1989  . Status post chemotherapy 10/07/12 - 12/16/12    Neoadjuvant chemotherapy with dose dense FEC (5-FU/epirubicin/Cytoxan) from 10/07/2012 through 12/16/12.    . Thyroid disease   . Use of exemestane (Aromasin) 07/25/13  . Use of goserelin acetate (Zoladex) 07/25/13  . Wears glasses     Patient Active Problem List   Diagnosis Date Noted  . HS (hereditary spherocytosis) ([place]) 09/20/2013  . Anemia, unspecified 01/27/2013  . Syncope, vasovagal 01/17/2013    Class: Chronic  . Genital herpes 10/14/2012  . Breast cancer of lower-outer quadrant of right female breast ([place]) 09/08/2012      Past Surgical History:  Procedure Laterality Date  . BREAST LUMPECTOMY WITH SENTINEL LYMPH NODE BIOPSY Right 04/06/2013   [name]  . BREAST SURGERY     left breast lumpectomy/left ax snbx  . PORT-A-CATH REMOVAL N/A 04/06/2013   Procedure: REMOVAL PORT-A-CATH;  Surgeon: [name], MD;  Location: [facility];  Service: General;  Laterality: N/A;  . PORTACATH PLACEMENT Left 09/20/2012   Procedure: INSERTION PORT-A-CATH;  Surgeon: [name], MD;  Location: [facility];  Service: General;  Laterality: Left;  . WISDOM TOOTH EXTRACTION      OB History    Gravida Para Term Preterm AB Living   0 0           SAB TAB Ectopic Multiple Live Births                  Obstetric Comments    menarche at age 29 she and her last menstrual cycle was on 08/26/2012. She is nulliparous       Home Medications    Prior to Admission medications   Medication Sig Start Date End Date Taking? Authorizing Provider  exemestane (AROMASIN) 25 MG tablet TAKE 1 TABLET BY MOUTH EVERY DAY AFTER BREAKFAST Patient taking differently: TAKE 25 MG BY MOUTH EVERY DAY AFTER BREAKFAST 10/10/15  Yes Nicholas Lose, MD  GuaiFENesin (MUCINEX PO) Take 1,200 mg by mouth 2 (two) times daily.   Yes Historical Provider, MD    Family History  Family History  Problem Relation Age of Onset  . Pulmonary embolism Maternal Uncle 36    died instantly  . Melanoma Cousin     paternal cousin dx in her 26s  . Heart disease Father   . Diabetes Father   . Thyroid disease Mother     Social History Social History  Substance Use Topics  . Smoking status: Never Smoker  . Smokeless tobacco: Never Used  . Alcohol use No     Allergies   Review of patient's allergies indicates no known allergies.   Review of Systems Review of Systems  Neurological: Positive for syncope.  All other systems reviewed and are negative.    Physical Exam Updated Vital Signs BP 126/62   Pulse 75    Temp 98.4 F (36.9 C) (Oral)   Resp 18   SpO2 96%   Physical Exam  Constitutional: She is oriented to person, place, and time. She appears well-developed and well-nourished.  HENT:  Head: Normocephalic and atraumatic.  Eyes: Conjunctivae and EOM are normal. Pupils are equal, round, and reactive to light.  Neck: Normal range of motion. Neck supple.  Cardiovascular: Normal rate and regular rhythm.  Exam reveals no gallop and no friction rub.   No murmur heard. Pulmonary/Chest: Effort normal and breath sounds normal. No respiratory distress. She has no wheezes. She has no rales. She exhibits no tenderness.  Abdominal: Soft. Bowel sounds are normal. She exhibits no distension and no mass. There is no tenderness. There is no rebound and no guarding.  Musculoskeletal: Normal range of motion. She exhibits no edema or tenderness.  Neurological: She is alert and oriented to person, place, and time.  Skin: Skin is warm and dry.  Psychiatric: She has a normal mood and affect. Her behavior is normal. Judgment and thought content normal.  Nursing note and vitals reviewed.    ED Treatments / Results  Labs (all labs ordered are listed, but only abnormal results are displayed) Labs Reviewed  CBC WITH DIFFERENTIAL/PLATELET - Abnormal; Notable for the following:       Result Value   RBC 3.51 (*)    Hemoglobin 9.6 (*)    HCT 28.3 (*)    RDW 19.5 (*)    All other components within normal limits  BASIC METABOLIC PANEL - Abnormal; Notable for the following:    Glucose, Bld 212 (*)    Calcium 8.6 (*)    All other components within normal limits    EKG  EKG Interpretation None       Radiology No results found.  Procedures Procedures (including critical care time)  Medications Ordered in ED Medications  sodium chloride 0.9 % bolus 1,000 mL (0 mLs Intravenous Stopped 12/04/15 1517)     Initial Impression / Assessment and Plan / ED Course  I have reviewed the triage vital signs and  the nursing notes.  Pertinent labs & imaging results that were available during my care of the patient were reviewed by me and considered in my medical decision making (see chart for details).  Clinical Course    Patient with syncopal episode. She is noted to be mildly anemic, but not far off of her normal baseline. She was orthostatic on arrival, but is feeling significantly better after receiving fluids in the ED. She is no longer dizzy when she stands. EKG reviewed with Dr. Johnney Killian. Have encouraged her to continue hydration at home. Patient understands agrees the plan. Return precautions given. Patient is stable and ready for discharge.  Final Clinical Impressions(s) / ED Diagnoses   Final diagnoses:  Syncope, unspecified syncope type  Orthostatic hypotension    New Prescriptions New Prescriptions   No medications on file     Montine Circle, PA-C 12/04/15 [place], MD 12/16/15 1541

## 2015-12-17 ENCOUNTER — Ambulatory Visit (HOSPITAL_BASED_OUTPATIENT_CLINIC_OR_DEPARTMENT_OTHER): Payer: BLUE CROSS/BLUE SHIELD

## 2015-12-17 VITALS — BP 148/72 | HR 75 | Temp 97.9°F | Resp 20

## 2015-12-17 DIAGNOSIS — Z5111 Encounter for antineoplastic chemotherapy: Secondary | ICD-10-CM | POA: Diagnosis not present

## 2015-12-17 DIAGNOSIS — C50511 Malignant neoplasm of lower-outer quadrant of right female breast: Secondary | ICD-10-CM

## 2015-12-17 MED ORDER — GOSERELIN ACETATE 10.8 MG ~~LOC~~ IMPL
10.8000 mg | DRUG_IMPLANT | Freq: Once | SUBCUTANEOUS | Status: AC
  Administered 2015-12-17: 10.8 mg via SUBCUTANEOUS
  Filled 2015-12-17: qty 10.8

## 2015-12-17 NOTE — Patient Instructions (Signed)
Goserelin injection What is this medicine? GOSERELIN (GOE se rel in) is similar to a hormone found in the body. It lowers the amount of sex hormones that the body makes. Men will have lower testosterone levels and women will have lower estrogen levels while taking this medicine. In men, this medicine is used to treat prostate cancer; the injection is either given once per month or once every 12 weeks. A once per month injection (only) is used to treat women with endometriosis, dysfunctional uterine bleeding, or advanced breast cancer. This medicine may be used for other purposes; ask your health care provider or pharmacist if you have questions. COMMON BRAND NAME(S): Zoladex What should I tell my health care provider before I take this medicine? They need to know if you have any of these conditions (some only apply to women): -diabetes -heart disease or previous heart attack -high blood pressure -high cholesterol -kidney disease -osteoporosis or low bone density -problems passing urine -spinal cord injury -stroke -tobacco smoker -an unusual or allergic reaction to goserelin, hormone therapy, other medicines, foods, dyes, or preservatives -pregnant or trying to get pregnant -breast-feeding How should I use this medicine? This medicine is for injection under the skin. It is given by a health care professional in a hospital or clinic setting. Men receive this injection once every 4 weeks or once every 12 weeks. Women will only receive the once every 4 weeks injection. Talk to your pediatrician regarding the use of this medicine in children. Special care may be needed. Overdosage: If you think you have taken too much of this medicine contact a poison control center or emergency room at once. NOTE: This medicine is only for you. Do not share this medicine with others. What if I miss a dose? It is important not to miss your dose. Call your doctor or health care professional if you are unable to  keep an appointment. What may interact with this medicine? -female hormones like estrogen -herbal or dietary supplements like black cohosh, chasteberry, or DHEA -female hormones like testosterone -prasterone This list may not describe all possible interactions. Give your health care provider a list of all the medicines, herbs, non-prescription drugs, or dietary supplements you use. Also tell them if you smoke, drink alcohol, or use illegal drugs. Some items may interact with your medicine. What should I watch for while using this medicine? Visit your doctor or health care professional for regular checks on your progress. Your symptoms may appear to get worse during the first weeks of this therapy. Tell your doctor or healthcare professional if your symptoms do not start to get better or if they get worse after this time. Your bones may get weaker if you take this medicine for a long time. If you smoke or frequently drink alcohol you may increase your risk of bone loss. A family history of osteoporosis, chronic use of drugs for seizures (convulsions), or corticosteroids can also increase your risk of bone loss. Talk to your doctor about how to keep your bones strong. This medicine should stop regular monthly menstration in women. Tell your doctor if you continue to menstrate. Women should not become pregnant while taking this medicine or for 12 weeks after stopping this medicine. Women should inform their doctor if they wish to become pregnant or think they might be pregnant. There is a potential for serious side effects to an unborn child. Talk to your health care professional or pharmacist for more information. Do not breast-feed an infant while taking   this medicine. Men should inform their doctors if they wish to father a child. This medicine may lower sperm counts. Talk to your health care professional or pharmacist for more information. What side effects may I notice from receiving this  medicine? Side effects that you should report to your doctor or health care professional as soon as possible: -allergic reactions like skin rash, itching or hives, swelling of the face, lips, or tongue -bone pain -breathing problems -changes in vision -chest pain -feeling faint or lightheaded, falls -fever, chills -pain, swelling, warmth in the leg -pain, tingling, numbness in the hands or feet -signs and symptoms of low blood pressure like dizziness; feeling faint or lightheaded, falls; unusually weak or tired -stomach pain -swelling of the ankles, feet, hands -trouble passing urine or change in the amount of urine -unusually high or low blood pressure -unusually weak or tired Side effects that usually do not require medical attention (report to your doctor or health care professional if they continue or are bothersome): -change in sex drive or performance -changes in breast size in both males and females -changes in emotions or moods -headache -hot flashes -irritation at site where injected -loss of appetite -skin problems like acne, dry skin -vaginal dryness This list may not describe all possible side effects. Call your doctor for medical advice about side effects. You may report side effects to FDA at 1-800-FDA-1088. Where should I keep my medicine? This drug is given in a hospital or clinic and will not be stored at home. NOTE: This sheet is a summary. It may not cover all possible information. If you have questions about this medicine, talk to your doctor, pharmacist, or health care provider.  2015, [employer]. (2013-03-28 11:10:35)  

## 2016-03-16 ENCOUNTER — Ambulatory Visit (HOSPITAL_BASED_OUTPATIENT_CLINIC_OR_DEPARTMENT_OTHER): Payer: BLUE CROSS/BLUE SHIELD | Admitting: Hematology and Oncology

## 2016-03-16 ENCOUNTER — Ambulatory Visit (HOSPITAL_BASED_OUTPATIENT_CLINIC_OR_DEPARTMENT_OTHER): Payer: BLUE CROSS/BLUE SHIELD

## 2016-03-16 ENCOUNTER — Encounter: Payer: Self-pay | Admitting: Hematology and Oncology

## 2016-03-16 DIAGNOSIS — Z79811 Long term (current) use of aromatase inhibitors: Secondary | ICD-10-CM

## 2016-03-16 DIAGNOSIS — C50511 Malignant neoplasm of lower-outer quadrant of right female breast: Secondary | ICD-10-CM

## 2016-03-16 DIAGNOSIS — D649 Anemia, unspecified: Secondary | ICD-10-CM

## 2016-03-16 DIAGNOSIS — Z5111 Encounter for antineoplastic chemotherapy: Secondary | ICD-10-CM

## 2016-03-16 DIAGNOSIS — Z170 Estrogen receptor positive status [ER+]: Secondary | ICD-10-CM

## 2016-03-16 DIAGNOSIS — N951 Menopausal and female climacteric states: Secondary | ICD-10-CM

## 2016-03-16 MED ORDER — GOSERELIN ACETATE 10.8 MG ~~LOC~~ IMPL
10.8000 mg | DRUG_IMPLANT | Freq: Once | SUBCUTANEOUS | Status: AC
  Administered 2016-03-16: 10.8 mg via SUBCUTANEOUS
  Filled 2016-03-16: qty 10.8

## 2016-03-16 NOTE — Assessment & Plan Note (Signed)
Right breast invasive ductal carcinoma ER/PR positive HER-2 negative stage II A. T3, N1, M0 with PTEN mutation Current treatment: Adjuvant Zoladex and exemestane started 07/03/2013, plan is for 5 years; changed to every 3 month Zoladex injection from May 2016  Toxicities of therapy: 1. Hot flashes : Much better 2. Injection site pain patient uses ice cube prior to injection  Surveillance for breast cancer: 1. Breast exam 03/16/2016  normal 2. Mammogram August 2016 normal, Density cat B 3. MRI January 2015 revealed changes in the right breast, most likely postsurgical in nature 4. Bone density done July 2015 was normal next bone density will need to be done this year.  Anemia due to malabsorption versus Spherocytosis: Monitoring hemoglobin levels. Hemoglobin was 11.   Return to clinic in 6 months for follow-up and q 3 months for Zoladex injections.

## 2016-03-16 NOTE — Progress Notes (Signed)
Patient Care Team: Darlyne Russian, MD as PCP - General (Family Medicine) Nicholas Lose, MD as Consulting Physician (Hematology and Oncology)  DIAGNOSIS:  Encounter Diagnosis  Name Primary?  . Malignant neoplasm of lower-outer quadrant of right breast of female, estrogen receptor positive ([place])     SUMMARY OF ONCOLOGIC HISTORY:   Breast cancer of lower-outer quadrant of right female breast ([place])   09/08/2012 Initial Diagnosis    Breast cancer of lower-outer quadrant of right female breast: ER 99% PR 100% HER-2 negative, Ki-67 70%      10/07/2012 - 03/17/2013 Neo-Adjuvant Chemotherapy    Dose dense FEC followed by weekly Taxol      04/06/2013 Surgery    Right breast lumpectomy residual 1.1 cm IDC grade 3 with high-grade DCIS, PNI +ve, 1/2 SLN positive, no MVI, T1 C. N1 A. stage IIA, ER 97% PR 95% HER-2 1.1 ratio negative      05/08/2013 - 06/22/2013 Radiation Therapy    Radiation therapy to lumpectomy site      07/03/2013 -  Anti-estrogen oral therapy    Zoladex plus Aromasin       Procedure    Genetic testing:PTEN C.-(1088_1063)del 26 on [employer] testing       CHIEF COMPLIANT: Follow-up on Aromasin and Zoladex  INTERVAL HISTORY: Mary Dougherty is a 76 year with above-mentioned history of found breast cancer currently on Zoladex with Aromasin. She continues to have hot flashes. These appear to have gotten worse. Denies any lumps or nodules in breast. She is due for mammograms in August.  REVIEW OF SYSTEMS:   Constitutional: Denies fevers, chills or abnormal weight loss Eyes: Denies blurriness of vision Ears, nose, mouth, throat, and face: Denies mucositis or sore throat Respiratory: Denies cough, dyspnea or wheezes Cardiovascular: Denies palpitation, chest discomfort Gastrointestinal:  Denies nausea, heartburn or change in bowel habits Skin: Denies abnormal skin rashes Lymphatics: Denies new lymphadenopathy or easy bruising Neurological:Denies numbness, tingling or new  weaknesses Behavioral/Psych: Mood is stable, no new changes  Extremities: No lower extremity edema Breast:  denies any pain or lumps or nodules in either breasts All other systems were reviewed with the patient and are negative.  I have reviewed the past medical history, past surgical history, social history and family history with the patient and they are unchanged from previous note.  ALLERGIES:  has No Known Allergies.  MEDICATIONS:  Current Outpatient Prescriptions  Medication Sig Dispense Refill  . exemestane (AROMASIN) 25 MG tablet TAKE 1 TABLET BY MOUTH EVERY DAY AFTER BREAKFAST (Patient taking differently: TAKE 25 MG BY MOUTH EVERY DAY AFTER BREAKFAST) 90 tablet 1   No current facility-administered medications for this visit.    Facility-Administered Medications Ordered in Other Visits  Medication Dose Route Frequency Provider Last Rate Last Dose  . goserelin (ZOLADEX) injection 10.8 mg  10.8 mg Subcutaneous Once Nicholas Lose, MD        PHYSICAL EXAMINATION: ECOG PERFORMANCE STATUS: 1 - Symptomatic but completely ambulatory  Vitals:   03/16/16 0900  BP: (!) 127/55  Pulse: 78  Resp: 18  Temp: 98 F (36.7 C)   Filed Weights   03/16/16 0900  Weight: 205 lb 8 oz (93.2 kg)    GENERAL:alert, no distress and comfortable SKIN: skin color, texture, turgor are normal, no rashes or significant lesions EYES: normal, Conjunctiva are pink and non-injected, sclera clear OROPHARYNX:no exudate, no erythema and lips, buccal mucosa, and tongue normal  NECK: supple, thyroid normal size, non-tender, without nodularity LYMPH:  no  palpable lymphadenopathy in the cervical, axillary or inguinal LUNGS: clear to auscultation and percussion with normal breathing effort HEART: regular rate & rhythm and no murmurs and no lower extremity edema ABDOMEN:abdomen soft, non-tender and normal bowel sounds MUSCULOSKELETAL:no cyanosis of digits and no clubbing  NEURO: alert & oriented x 3 with  fluent speech, no focal motor/sensory deficits EXTREMITIES: No lower extremity edema  LABORATORY DATA:  I have reviewed the data as listed   Chemistry      Component Value Date/Time   NA 136 12/04/2015 1252   NA 141 03/08/2015 1544   K 3.6 12/04/2015 1252   K 4.2 03/08/2015 1544   CL 102 12/04/2015 1252   CO2 26 12/04/2015 1252   CO2 26 03/08/2015 1544   BUN 8 12/04/2015 1252   BUN 12.6 03/08/2015 1544   CREATININE 0.76 12/04/2015 1252   CREATININE 0.8 03/08/2015 1544   GLU 124 (H) 03/10/2013 1438      Component Value Date/Time   CALCIUM 8.6 (L) 12/04/2015 1252   CALCIUM 9.3 03/08/2015 1544   ALKPHOS 112 03/08/2015 1544   AST 14 03/08/2015 1544   ALT 19 03/08/2015 1544   BILITOT 1.45 (H) 03/08/2015 1544       Lab Results  Component Value Date   WBC 7.2 12/04/2015   HGB 9.6 (L) 12/04/2015   HCT 28.3 (L) 12/04/2015   MCV 80.6 12/04/2015   PLT 180 12/04/2015   NEUTROABS 4.7 12/04/2015    ASSESSMENT & PLAN:  Breast cancer of lower-outer quadrant of right female breast Right breast invasive ductal carcinoma ER/PR positive HER-2 negative stage II A. T3, N1, M0 with PTEN mutation Current treatment: Adjuvant Zoladex and exemestane started 07/03/2013, plan is for 5 years; changed to every 3 month Zoladex injection from May 2016  Toxicities of therapy: 1. Hot flashes : Much better 2. Injection site pain patient uses ice cube prior to injection  Surveillance for breast cancer: 1. Breast exam 03/16/2016  normal 2. Mammogram August 2017 normal, Density cat B 3. MRI January 2015 revealed changes in the right breast, most likely postsurgical in nature 4. Bone density done July 2015 was normal next bone density will need to be done this year.  Anemia due to malabsorption versus Spherocytosis: Monitoring hemoglobin levels. Hemoglobin was 11.   Return to clinic in 1 year for follow-up and q 3 months for Zoladex injections.   I spent 25 minutes talking to the patient  of which more than half was spent in counseling and coordination of care.  No orders of the defined types were placed in this encounter.  The patient has a good understanding of the overall plan. she agrees with it. she will call with any problems that may develop before the next visit here.   Rulon Eisenmenger, MD 03/16/16

## 2016-03-16 NOTE — Patient Instructions (Signed)
Goserelin injection What is this medicine? GOSERELIN (GOE se rel in) is similar to a hormone found in the body. It lowers the amount of sex hormones that the body makes. Men will have lower testosterone levels and women will have lower estrogen levels while taking this medicine. In men, this medicine is used to treat prostate cancer; the injection is either given once per month or once every 12 weeks. A once per month injection (only) is used to treat women with endometriosis, dysfunctional uterine bleeding, or advanced breast cancer. This medicine may be used for other purposes; ask your health care provider or pharmacist if you have questions. COMMON BRAND NAME(S): Zoladex What should I tell my health care provider before I take this medicine? They need to know if you have any of these conditions (some only apply to women): -diabetes -heart disease or previous heart attack -high blood pressure -high cholesterol -kidney disease -osteoporosis or low bone density -problems passing urine -spinal cord injury -stroke -tobacco smoker -an unusual or allergic reaction to goserelin, hormone therapy, other medicines, foods, dyes, or preservatives -pregnant or trying to get pregnant -breast-feeding How should I use this medicine? This medicine is for injection under the skin. It is given by a health care professional in a hospital or clinic setting. Men receive this injection once every 4 weeks or once every 12 weeks. Women will only receive the once every 4 weeks injection. Talk to your pediatrician regarding the use of this medicine in children. Special care may be needed. Overdosage: If you think you have taken too much of this medicine contact a poison control center or emergency room at once. NOTE: This medicine is only for you. Do not share this medicine with others. What if I miss a dose? It is important not to miss your dose. Call your doctor or health care professional if you are unable to  keep an appointment. What may interact with this medicine? -female hormones like estrogen -herbal or dietary supplements like black cohosh, chasteberry, or DHEA -female hormones like testosterone -prasterone This list may not describe all possible interactions. Give your health care provider a list of all the medicines, herbs, non-prescription drugs, or dietary supplements you use. Also tell them if you smoke, drink alcohol, or use illegal drugs. Some items may interact with your medicine. What should I watch for while using this medicine? Visit your doctor or health care professional for regular checks on your progress. Your symptoms may appear to get worse during the first weeks of this therapy. Tell your doctor or healthcare professional if your symptoms do not start to get better or if they get worse after this time. Your bones may get weaker if you take this medicine for a long time. If you smoke or frequently drink alcohol you may increase your risk of bone loss. A family history of osteoporosis, chronic use of drugs for seizures (convulsions), or corticosteroids can also increase your risk of bone loss. Talk to your doctor about how to keep your bones strong. This medicine should stop regular monthly menstration in women. Tell your doctor if you continue to menstrate. Women should not become pregnant while taking this medicine or for 12 weeks after stopping this medicine. Women should inform their doctor if they wish to become pregnant or think they might be pregnant. There is a potential for serious side effects to an unborn child. Talk to your health care professional or pharmacist for more information. Do not breast-feed an infant while taking   this medicine. Men should inform their doctors if they wish to father a child. This medicine may lower sperm counts. Talk to your health care professional or pharmacist for more information. What side effects may I notice from receiving this  medicine? Side effects that you should report to your doctor or health care professional as soon as possible: -allergic reactions like skin rash, itching or hives, swelling of the face, lips, or tongue -bone pain -breathing problems -changes in vision -chest pain -feeling faint or lightheaded, falls -fever, chills -pain, swelling, warmth in the leg -pain, tingling, numbness in the hands or feet -signs and symptoms of low blood pressure like dizziness; feeling faint or lightheaded, falls; unusually weak or tired -stomach pain -swelling of the ankles, feet, hands -trouble passing urine or change in the amount of urine -unusually high or low blood pressure -unusually weak or tired Side effects that usually do not require medical attention (report to your doctor or health care professional if they continue or are bothersome): -change in sex drive or performance -changes in breast size in both males and females -changes in emotions or moods -headache -hot flashes -irritation at site where injected -loss of appetite -skin problems like acne, dry skin -vaginal dryness This list may not describe all possible side effects. Call your doctor for medical advice about side effects. You may report side effects to FDA at 1-800-FDA-1088. Where should I keep my medicine? This drug is given in a hospital or clinic and will not be stored at home. NOTE: This sheet is a summary. It may not cover all possible information. If you have questions about this medicine, talk to your doctor, pharmacist, or health care provider.  2015, [employer]. (2013-03-28 11:10:35)  

## 2016-03-18 ENCOUNTER — Telehealth: Payer: Self-pay | Admitting: *Deleted

## 2016-03-18 NOTE — Telephone Encounter (Signed)
Patient called requesting scheduler for next zoladex injection.  Call transferred ext [phone].

## 2016-03-18 NOTE — Telephone Encounter (Signed)
Per patient request I have scheduled her next injection appt. Patietn aware of date/time

## 2016-03-21 ENCOUNTER — Telehealth: Payer: Self-pay | Admitting: Hematology and Oncology

## 2016-03-21 NOTE — Telephone Encounter (Signed)
Lvm advising appts 8/17 @ 1.30, 11/16 @ 1.30, + 03/17/17 @ 1.45. Also, mailed appt calendar.

## 2016-04-06 ENCOUNTER — Other Ambulatory Visit: Payer: Self-pay | Admitting: *Deleted

## 2016-04-06 DIAGNOSIS — C50511 Malignant neoplasm of lower-outer quadrant of right female breast: Secondary | ICD-10-CM

## 2016-04-06 MED ORDER — EXEMESTANE 25 MG PO TABS: ORAL_TABLET | ORAL | 3 refills | Status: DC

## 2016-06-12 ENCOUNTER — Ambulatory Visit (HOSPITAL_BASED_OUTPATIENT_CLINIC_OR_DEPARTMENT_OTHER): Payer: BLUE CROSS/BLUE SHIELD

## 2016-06-12 ENCOUNTER — Other Ambulatory Visit: Payer: Self-pay

## 2016-06-12 VITALS — BP 130/52 | HR 95 | Temp 98.0°F | Resp 20

## 2016-06-12 DIAGNOSIS — C50511 Malignant neoplasm of lower-outer quadrant of right female breast: Secondary | ICD-10-CM

## 2016-06-12 DIAGNOSIS — Z5111 Encounter for antineoplastic chemotherapy: Secondary | ICD-10-CM

## 2016-06-12 MED ORDER — LIDOCAINE-PRILOCAINE 2.5-2.5 % EX CREA: 1.0000 "application " | TOPICAL_CREAM | CUTANEOUS | 0 refills | Status: DC | PRN

## 2016-06-12 MED ORDER — GOSERELIN ACETATE 10.8 MG ~~LOC~~ IMPL
10.8000 mg | DRUG_IMPLANT | Freq: Once | SUBCUTANEOUS | Status: AC
  Administered 2016-06-12: 10.8 mg via SUBCUTANEOUS
  Filled 2016-06-12: qty 10.8

## 2016-06-12 NOTE — Patient Instructions (Signed)
Goserelin injection What is this medicine? GOSERELIN (GOE se rel in) is similar to a hormone found in the body. It lowers the amount of sex hormones that the body makes. Men will have lower testosterone levels and women will have lower estrogen levels while taking this medicine. In men, this medicine is used to treat prostate cancer; the injection is either given once per month or once every 12 weeks. A once per month injection (only) is used to treat women with endometriosis, dysfunctional uterine bleeding, or advanced breast cancer. This medicine may be used for other purposes; ask your health care provider or pharmacist if you have questions. COMMON BRAND NAME(S): Zoladex What should I tell my health care provider before I take this medicine? They need to know if you have any of these conditions (some only apply to women): -diabetes -heart disease or previous heart attack -high blood pressure -high cholesterol -kidney disease -osteoporosis or low bone density -problems passing urine -spinal cord injury -stroke -tobacco smoker -an unusual or allergic reaction to goserelin, hormone therapy, other medicines, foods, dyes, or preservatives -pregnant or trying to get pregnant -breast-feeding How should I use this medicine? This medicine is for injection under the skin. It is given by a health care professional in a hospital or clinic setting. Men receive this injection once every 4 weeks or once every 12 weeks. Women will only receive the once every 4 weeks injection. Talk to your pediatrician regarding the use of this medicine in children. Special care may be needed. Overdosage: If you think you have taken too much of this medicine contact a poison control center or emergency room at once. NOTE: This medicine is only for you. Do not share this medicine with others. What if I miss a dose? It is important not to miss your dose. Call your doctor or health care professional if you are unable to  keep an appointment. What may interact with this medicine? -female hormones like estrogen -herbal or dietary supplements like black cohosh, chasteberry, or DHEA -female hormones like testosterone -prasterone This list may not describe all possible interactions. Give your health care provider a list of all the medicines, herbs, non-prescription drugs, or dietary supplements you use. Also tell them if you smoke, drink alcohol, or use illegal drugs. Some items may interact with your medicine. What should I watch for while using this medicine? Visit your doctor or health care professional for regular checks on your progress. Your symptoms may appear to get worse during the first weeks of this therapy. Tell your doctor or healthcare professional if your symptoms do not start to get better or if they get worse after this time. Your bones may get weaker if you take this medicine for a long time. If you smoke or frequently drink alcohol you may increase your risk of bone loss. A family history of osteoporosis, chronic use of drugs for seizures (convulsions), or corticosteroids can also increase your risk of bone loss. Talk to your doctor about how to keep your bones strong. This medicine should stop regular monthly menstration in women. Tell your doctor if you continue to menstrate. Women should not become pregnant while taking this medicine or for 12 weeks after stopping this medicine. Women should inform their doctor if they wish to become pregnant or think they might be pregnant. There is a potential for serious side effects to an unborn child. Talk to your health care professional or pharmacist for more information. Do not breast-feed an infant while taking   this medicine. Men should inform their doctors if they wish to father a child. This medicine may lower sperm counts. Talk to your health care professional or pharmacist for more information. What side effects may I notice from receiving this  medicine? Side effects that you should report to your doctor or health care professional as soon as possible: -allergic reactions like skin rash, itching or hives, swelling of the face, lips, or tongue -bone pain -breathing problems -changes in vision -chest pain -feeling faint or lightheaded, falls -fever, chills -pain, swelling, warmth in the leg -pain, tingling, numbness in the hands or feet -signs and symptoms of low blood pressure like dizziness; feeling faint or lightheaded, falls; unusually weak or tired -stomach pain -swelling of the ankles, feet, hands -trouble passing urine or change in the amount of urine -unusually high or low blood pressure -unusually weak or tired Side effects that usually do not require medical attention (report to your doctor or health care professional if they continue or are bothersome): -change in sex drive or performance -changes in breast size in both males and females -changes in emotions or moods -headache -hot flashes -irritation at site where injected -loss of appetite -skin problems like acne, dry skin -vaginal dryness This list may not describe all possible side effects. Call your doctor for medical advice about side effects. You may report side effects to FDA at 1-800-FDA-1088. Where should I keep my medicine? This drug is given in a hospital or clinic and will not be stored at home. NOTE: This sheet is a summary. It may not cover all possible information. If you have questions about this medicine, talk to your doctor, pharmacist, or health care provider.  2015, [employer]. (2013-03-28 11:10:35)  

## 2016-09-18 ENCOUNTER — Ambulatory Visit (HOSPITAL_BASED_OUTPATIENT_CLINIC_OR_DEPARTMENT_OTHER): Payer: BLUE CROSS/BLUE SHIELD

## 2016-09-18 VITALS — BP 135/75 | HR 95 | Temp 98.4°F | Resp 18

## 2016-09-18 DIAGNOSIS — Z5111 Encounter for antineoplastic chemotherapy: Secondary | ICD-10-CM | POA: Diagnosis not present

## 2016-09-18 DIAGNOSIS — C50511 Malignant neoplasm of lower-outer quadrant of right female breast: Secondary | ICD-10-CM | POA: Diagnosis not present

## 2016-09-18 MED ORDER — GOSERELIN ACETATE 10.8 MG ~~LOC~~ IMPL
10.8000 mg | DRUG_IMPLANT | Freq: Once | SUBCUTANEOUS | Status: AC
  Administered 2016-09-18: 10.8 mg via SUBCUTANEOUS
  Filled 2016-09-18: qty 10.8

## 2016-09-18 NOTE — Patient Instructions (Signed)
Goserelin injection What is this medicine? GOSERELIN (GOE se rel in) is similar to a hormone found in the body. It lowers the amount of sex hormones that the body makes. Men will have lower testosterone levels and women will have lower estrogen levels while taking this medicine. In men, this medicine is used to treat prostate cancer; the injection is either given once per month or once every 12 weeks. A once per month injection (only) is used to treat women with endometriosis, dysfunctional uterine bleeding, or advanced breast cancer. This medicine may be used for other purposes; ask your health care provider or pharmacist if you have questions. COMMON BRAND NAME(S): Zoladex What should I tell my health care provider before I take this medicine? They need to know if you have any of these conditions (some only apply to women): -diabetes -heart disease or previous heart attack -high blood pressure -high cholesterol -kidney disease -osteoporosis or low bone density -problems passing urine -spinal cord injury -stroke -tobacco smoker -an unusual or allergic reaction to goserelin, hormone therapy, other medicines, foods, dyes, or preservatives -pregnant or trying to get pregnant -breast-feeding How should I use this medicine? This medicine is for injection under the skin. It is given by a health care professional in a hospital or clinic setting. Men receive this injection once every 4 weeks or once every 12 weeks. Women will only receive the once every 4 weeks injection. Talk to your pediatrician regarding the use of this medicine in children. Special care may be needed. Overdosage: If you think you have taken too much of this medicine contact a poison control center or emergency room at once. NOTE: This medicine is only for you. Do not share this medicine with others. What if I miss a dose? It is important not to miss your dose. Call your doctor or health care professional if you are unable to  keep an appointment. What may interact with this medicine? -female hormones like estrogen -herbal or dietary supplements like black cohosh, chasteberry, or DHEA -female hormones like testosterone -prasterone This list may not describe all possible interactions. Give your health care provider a list of all the medicines, herbs, non-prescription drugs, or dietary supplements you use. Also tell them if you smoke, drink alcohol, or use illegal drugs. Some items may interact with your medicine. What should I watch for while using this medicine? Visit your doctor or health care professional for regular checks on your progress. Your symptoms may appear to get worse during the first weeks of this therapy. Tell your doctor or healthcare professional if your symptoms do not start to get better or if they get worse after this time. Your bones may get weaker if you take this medicine for a long time. If you smoke or frequently drink alcohol you may increase your risk of bone loss. A family history of osteoporosis, chronic use of drugs for seizures (convulsions), or corticosteroids can also increase your risk of bone loss. Talk to your doctor about how to keep your bones strong. This medicine should stop regular monthly menstration in women. Tell your doctor if you continue to menstrate. Women should not become pregnant while taking this medicine or for 12 weeks after stopping this medicine. Women should inform their doctor if they wish to become pregnant or think they might be pregnant. There is a potential for serious side effects to an unborn child. Talk to your health care professional or pharmacist for more information. Do not breast-feed an infant while taking   this medicine. Men should inform their doctors if they wish to father a child. This medicine may lower sperm counts. Talk to your health care professional or pharmacist for more information. What side effects may I notice from receiving this  medicine? Side effects that you should report to your doctor or health care professional as soon as possible: -allergic reactions like skin rash, itching or hives, swelling of the face, lips, or tongue -bone pain -breathing problems -changes in vision -chest pain -feeling faint or lightheaded, falls -fever, chills -pain, swelling, warmth in the leg -pain, tingling, numbness in the hands or feet -signs and symptoms of low blood pressure like dizziness; feeling faint or lightheaded, falls; unusually weak or tired -stomach pain -swelling of the ankles, feet, hands -trouble passing urine or change in the amount of urine -unusually high or low blood pressure -unusually weak or tired Side effects that usually do not require medical attention (report to your doctor or health care professional if they continue or are bothersome): -change in sex drive or performance -changes in breast size in both males and females -changes in emotions or moods -headache -hot flashes -irritation at site where injected -loss of appetite -skin problems like acne, dry skin -vaginal dryness This list may not describe all possible side effects. Call your doctor for medical advice about side effects. You may report side effects to FDA at 1-800-FDA-1088. Where should I keep my medicine? This drug is given in a hospital or clinic and will not be stored at home. NOTE: This sheet is a summary. It may not cover all possible information. If you have questions about this medicine, talk to your doctor, pharmacist, or health care provider.  2015, [employer]. (2013-03-28 11:10:35)  

## 2016-10-15 ENCOUNTER — Encounter: Payer: Self-pay | Admitting: Hematology and Oncology

## 2016-11-04 ENCOUNTER — Encounter: Payer: Self-pay | Admitting: Family Medicine

## 2016-11-04 ENCOUNTER — Ambulatory Visit (INDEPENDENT_AMBULATORY_CARE_PROVIDER_SITE_OTHER): Payer: BLUE CROSS/BLUE SHIELD | Admitting: Family Medicine

## 2016-11-04 VITALS — BP 126/64 | HR 103 | Temp 98.1°F | Resp 17 | Ht 65.0 in | Wt 200.0 lb

## 2016-11-04 DIAGNOSIS — J3089 Other allergic rhinitis: Secondary | ICD-10-CM | POA: Diagnosis not present

## 2016-11-04 DIAGNOSIS — J069 Acute upper respiratory infection, unspecified: Secondary | ICD-10-CM

## 2016-11-04 MED ORDER — FLUTICASONE PROPIONATE 50 MCG/ACT NA SUSP: 1.0000 | Freq: Every day | NASAL | 2 refills | Status: DC

## 2016-11-04 MED ORDER — LORATADINE 10 MG PO TABS: 10.0000 mg | ORAL_TABLET | Freq: Every day | ORAL | 3 refills | Status: DC

## 2016-11-04 MED ORDER — AZITHROMYCIN 250 MG PO TABS: ORAL_TABLET | ORAL | 0 refills | Status: DC

## 2016-11-04 NOTE — Progress Notes (Signed)
Chief Complaint  Patient presents with  . Nasal Congestion    onset: Sunday, chest and neck congestion, congestion worsening by the day, took mucinex and vitamin c x 2 days, folic acid today and  yesterday.  . Ear Pain    left, onset: Sunday    HPI   Pt reports that she was working on putting hay in the yard and also has a great nephew who is sick who likes to drink out of other people's cups She states that she started having symptoms after that on Sunday, 11/01/16, with chest and throat congestion Her congestion has been mostly in her chest She reports that she has either runny nose or congested nose  She has a history of spherocytosis   She took mucinex, vitam C and folic acid She does not drink a lot of fluids   Past Medical History:  Diagnosis Date  . Anemia   . Blood transfusion without reported diagnosis   . Chronic anemia 03/03/13  . Hereditary spherocytosis (HCC)   . Invasive ductal carcinoma of right breast (HCC) 09/2012   ER(99%), PR(100%), Ki-67(70%), 1/5 nodes positive  . S/P radiation therapy 05/08/2013-06/22/2013   1) Right breast / 50.4 Gy in 28 fractions / 2) Right Supraclavicular fossa/ 50.4 Gy in 28 fractions /3) Right Posterior Axillary boost / 11.9 Gy in 28 fractions /4) Right breast boost / 10 Gy in 5 fractions   . Spherocytosis (HCC) 1989  . Status post chemotherapy 10/07/12 - 12/16/12    Neoadjuvant chemotherapy with dose dense FEC (5-FU/epirubicin/Cytoxan) from 10/07/2012 through 12/16/12.    . Thyroid disease   . Use of exemestane (Aromasin) 07/25/13  . Use of goserelin acetate (Zoladex) 07/25/13  . Wears glasses     Current Outpatient Prescriptions  Medication Sig Dispense Refill  . exemestane (AROMASIN) 25 MG tablet TAKE 1 TABLET BY MOUTH EVERY DAY AFTER BREAKFAST 90 tablet 3  . lidocaine-prilocaine (EMLA) cream Apply 1 application topically as needed. 30 g 0  . azithromycin (ZITHROMAX Z-PAK) 250 MG tablet Take 2 tabs on day one then one tablet each day  after 6 each 0  . fluticasone (FLONASE) 50 MCG/ACT nasal spray Place 1 spray into both nostrils daily. 16 g 2  . loratadine (CLARITIN) 10 MG tablet Take 1 tablet (10 mg total) by mouth daily. 30 tablet 3   No current facility-administered medications for this visit.     Allergies: No Known Allergies  Past Surgical History:  Procedure Laterality Date  . BREAST LUMPECTOMY WITH SENTINEL LYMPH NODE BIOPSY Right 04/06/2013   Emelia Loron  . BREAST SURGERY     left breast lumpectomy/left ax snbx  . PORT-A-CATH REMOVAL N/A 04/06/2013   Procedure: REMOVAL PORT-A-CATH;  Surgeon: Emelia Loron, MD;  Location: [facility];  Service: General;  Laterality: N/A;  . PORTACATH PLACEMENT Left 09/20/2012   Procedure: INSERTION PORT-A-CATH;  Surgeon: Emelia Loron, MD;  Location: [facility];  Service: General;  Laterality: Left;  . WISDOM TOOTH EXTRACTION      Social History   Social History  . Marital status: Single    Spouse name: N/A  . Number of children: N/A  . Years of education: N/A   Social History Main Topics  . Smoking status: Never Smoker  . Smokeless tobacco: Never Used  . Alcohol use No  . Drug use: No  . Sexual activity: No     Comment: menarche age 59, P0,  last menstrual cycle 08/26/2012   Other  Topics Concern  . None   Social History Narrative  . None    ROS   Objective: Vitals:   11/04/16 1113  BP: 126/64  Pulse: (!) 103  Resp: 17  Temp: 98.1 F (36.7 C)  TempSrc: Oral  SpO2: 96%  Weight: 200 lb (90.7 kg)  Height: '5\' 5"'$  (1.651 m)    Physical Exam General: alert, oriented, in NAD Head: normocephalic, atraumatic, no sinus tenderness Eyes: EOM intact, no scleral icterus or conjunctival injection, scratching eyes Ears: TM clear bilaterally Nose: mucosa +erythematous, +edematous, pt scratching nose Throat: no pharyngeal exudate or erythema Lymph: no posterior auricular, submental or cervical lymph adenopathy Heart:  normal rate, normal sinus rhythm, no murmurs Lungs: clear to auscultation bilaterally, no wheezing   Assessment and Plan Kenyetta was seen today for nasal congestion and ear pain.  Diagnoses and all orders for this visit:  Viral URI  Other allergic rhinitis  Gave script for antibiotic for patient to take if her symptoms progress to bacterial bronchitis or sinusitis  For now will treat as viral uri and allergic rhinitis  Antihistamine, nasal steroid as well as increase Continue mucinex -     Cancel: Flu Vaccine QUAD 36+ mos IM -     fluticasone (FLONASE) 50 MCG/ACT nasal spray; Place 1 spray into both nostrils daily. -     loratadine (CLARITIN) 10 MG tablet; Take 1 tablet (10 mg total) by mouth daily. -     azithromycin (ZITHROMAX Z-PAK) 250 MG tablet; Take 2 tabs on day one then one tablet each day after     [place]

## 2016-11-04 NOTE — Patient Instructions (Addendum)
1. Take claritin and flonase in the morning to improve symptoms 2. Continue mucinex 3. If symptoms worsen then plan to take zpak    IF you received an x-ray today, you will receive an invoice from [facility]. Please contact [facility] at [phone] with questions or concerns regarding your invoice.   IF you received labwork today, you will receive an invoice from [place]. Please contact [facility] at [phone] with questions or concerns regarding your invoice.   Our billing staff will not be able to assist you with questions regarding bills from these companies.  You will be contacted with the lab results as soon as they are available. The fastest way to get your results is to activate your My Chart account. Instructions are located on the last page of this paperwork. If you have not heard from [place] regarding the results in 2 weeks, please contact this office.    Allergic Rhinitis Allergic rhinitis is when the mucous membranes in the nose respond to allergens. Allergens are particles in the air that cause your body to have an allergic reaction. This causes you to release allergic antibodies. Through a chain of events, these eventually cause you to release histamine into the blood stream. Although meant to protect the body, it is this release of histamine that causes your discomfort, such as frequent sneezing, congestion, and an itchy, runny nose. What are the causes? Seasonal allergic rhinitis (hay fever) is caused by pollen allergens that may come from grasses, trees, and weeds. Year-round allergic rhinitis (perennial allergic rhinitis) is caused by allergens such as house dust mites, pet dander, and mold spores. What are the signs or symptoms?  Nasal stuffiness (congestion).  Itchy, runny nose with sneezing and tearing of the eyes. How is this diagnosed? Your health care provider can help you determine the allergen or allergens that trigger your symptoms. If you and  your health care provider are unable to determine the allergen, skin or blood testing may be used. Your health care provider will diagnose your condition after taking your health history and performing a physical exam. Your health care provider may assess you for other related conditions, such as asthma, pink eye, or an ear infection. How is this treated? Allergic rhinitis does not have a cure, but it can be controlled by:  Medicines that block allergy symptoms. These may include allergy shots, nasal sprays, and oral antihistamines.  Avoiding the allergen.  Hay fever may often be treated with antihistamines in pill or nasal spray forms. Antihistamines block the effects of histamine. There are over-the-counter medicines that may help with nasal congestion and swelling around the eyes. Check with your health care provider before taking or giving this medicine. If avoiding the allergen or the medicine prescribed do not work, there are many new medicines your health care provider can prescribe. Stronger medicine may be used if initial measures are ineffective. Desensitizing injections can be used if medicine and avoidance does not work. Desensitization is when a patient is given ongoing shots until the body becomes less sensitive to the allergen. Make sure you follow up with your health care provider if problems continue. Follow these instructions at home: It is not possible to completely avoid allergens, but you can reduce your symptoms by taking steps to limit your exposure to them. It helps to know exactly what you are allergic to so that you can avoid your specific triggers. Contact a health care provider if:  You have a fever.  You develop a cough  that does not stop easily (persistent).  You have shortness of breath.  You start wheezing.  Symptoms interfere with normal daily activities. This information is not intended to replace advice given to you by your health care provider. Make sure you  discuss any questions you have with your health care provider. Document Released: 10/14/2000 Document Revised: 09/20/2015 Document Reviewed: 09/26/2012 Elsevier Interactive Patient Education  2017 [employer].

## 2016-12-18 ENCOUNTER — Ambulatory Visit (HOSPITAL_BASED_OUTPATIENT_CLINIC_OR_DEPARTMENT_OTHER): Payer: BLUE CROSS/BLUE SHIELD

## 2016-12-18 VITALS — BP 136/75 | HR 101 | Temp 98.0°F | Resp 18

## 2016-12-18 DIAGNOSIS — Z5111 Encounter for antineoplastic chemotherapy: Secondary | ICD-10-CM | POA: Diagnosis not present

## 2016-12-18 DIAGNOSIS — C50511 Malignant neoplasm of lower-outer quadrant of right female breast: Secondary | ICD-10-CM | POA: Diagnosis not present

## 2016-12-18 MED ORDER — GOSERELIN ACETATE 10.8 MG ~~LOC~~ IMPL
10.8000 mg | DRUG_IMPLANT | Freq: Once | SUBCUTANEOUS | Status: AC
  Administered 2016-12-18: 10.8 mg via SUBCUTANEOUS
  Filled 2016-12-18: qty 10.8

## 2016-12-18 NOTE — Patient Instructions (Signed)
Goserelin injection What is this medicine? GOSERELIN (GOE se rel in) is similar to a hormone found in the body. It lowers the amount of sex hormones that the body makes. Men will have lower testosterone levels and women will have lower estrogen levels while taking this medicine. In men, this medicine is used to treat prostate cancer; the injection is either given once per month or once every 12 weeks. A once per month injection (only) is used to treat women with endometriosis, dysfunctional uterine bleeding, or advanced breast cancer. This medicine may be used for other purposes; ask your health care provider or pharmacist if you have questions. COMMON BRAND NAME(S): Zoladex What should I tell my health care provider before I take this medicine? They need to know if you have any of these conditions (some only apply to women): -diabetes -heart disease or previous heart attack -high blood pressure -high cholesterol -kidney disease -osteoporosis or low bone density -problems passing urine -spinal cord injury -stroke -tobacco smoker -an unusual or allergic reaction to goserelin, hormone therapy, other medicines, foods, dyes, or preservatives -pregnant or trying to get pregnant -breast-feeding How should I use this medicine? This medicine is for injection under the skin. It is given by a health care professional in a hospital or clinic setting. Men receive this injection once every 4 weeks or once every 12 weeks. Women will only receive the once every 4 weeks injection. Talk to your pediatrician regarding the use of this medicine in children. Special care may be needed. Overdosage: If you think you have taken too much of this medicine contact a poison control center or emergency room at once. NOTE: This medicine is only for you. Do not share this medicine with others. What if I miss a dose? It is important not to miss your dose. Call your doctor or health care professional if you are unable to  keep an appointment. What may interact with this medicine? -female hormones like estrogen -herbal or dietary supplements like black cohosh, chasteberry, or DHEA -female hormones like testosterone -prasterone This list may not describe all possible interactions. Give your health care provider a list of all the medicines, herbs, non-prescription drugs, or dietary supplements you use. Also tell them if you smoke, drink alcohol, or use illegal drugs. Some items may interact with your medicine. What should I watch for while using this medicine? Visit your doctor or health care professional for regular checks on your progress. Your symptoms may appear to get worse during the first weeks of this therapy. Tell your doctor or healthcare professional if your symptoms do not start to get better or if they get worse after this time. Your bones may get weaker if you take this medicine for a long time. If you smoke or frequently drink alcohol you may increase your risk of bone loss. A family history of osteoporosis, chronic use of drugs for seizures (convulsions), or corticosteroids can also increase your risk of bone loss. Talk to your doctor about how to keep your bones strong. This medicine should stop regular monthly menstration in women. Tell your doctor if you continue to menstrate. Women should not become pregnant while taking this medicine or for 12 weeks after stopping this medicine. Women should inform their doctor if they wish to become pregnant or think they might be pregnant. There is a potential for serious side effects to an unborn child. Talk to your health care professional or pharmacist for more information. Do not breast-feed an infant while taking   this medicine. Men should inform their doctors if they wish to father a child. This medicine may lower sperm counts. Talk to your health care professional or pharmacist for more information. What side effects may I notice from receiving this  medicine? Side effects that you should report to your doctor or health care professional as soon as possible: -allergic reactions like skin rash, itching or hives, swelling of the face, lips, or tongue -bone pain -breathing problems -changes in vision -chest pain -feeling faint or lightheaded, falls -fever, chills -pain, swelling, warmth in the leg -pain, tingling, numbness in the hands or feet -signs and symptoms of low blood pressure like dizziness; feeling faint or lightheaded, falls; unusually weak or tired -stomach pain -swelling of the ankles, feet, hands -trouble passing urine or change in the amount of urine -unusually high or low blood pressure -unusually weak or tired Side effects that usually do not require medical attention (report to your doctor or health care professional if they continue or are bothersome): -change in sex drive or performance -changes in breast size in both males and females -changes in emotions or moods -headache -hot flashes -irritation at site where injected -loss of appetite -skin problems like acne, dry skin -vaginal dryness This list may not describe all possible side effects. Call your doctor for medical advice about side effects. You may report side effects to FDA at 1-800-FDA-1088. Where should I keep my medicine? This drug is given in a hospital or clinic and will not be stored at home. NOTE: This sheet is a summary. It may not cover all possible information. If you have questions about this medicine, talk to your doctor, pharmacist, or health care provider.  2015, [employer]. (2013-03-28 11:10:35)  

## 2017-02-25 ENCOUNTER — Other Ambulatory Visit: Payer: Self-pay | Admitting: Family Medicine

## 2017-03-17 ENCOUNTER — Inpatient Hospital Stay: Payer: BLUE CROSS/BLUE SHIELD

## 2017-03-17 ENCOUNTER — Inpatient Hospital Stay: Payer: BLUE CROSS/BLUE SHIELD | Attending: Hematology and Oncology | Admitting: Hematology and Oncology

## 2017-03-17 ENCOUNTER — Telehealth: Payer: Self-pay | Admitting: Hematology and Oncology

## 2017-03-17 DIAGNOSIS — C50511 Malignant neoplasm of lower-outer quadrant of right female breast: Secondary | ICD-10-CM

## 2017-03-17 DIAGNOSIS — Z170 Estrogen receptor positive status [ER+]: Secondary | ICD-10-CM

## 2017-03-17 DIAGNOSIS — Z5111 Encounter for antineoplastic chemotherapy: Secondary | ICD-10-CM | POA: Diagnosis present

## 2017-03-17 MED ORDER — EXEMESTANE 25 MG PO TABS: ORAL_TABLET | ORAL | 3 refills | Status: DC

## 2017-03-17 MED ORDER — GOSERELIN ACETATE 10.8 MG ~~LOC~~ IMPL
10.8000 mg | DRUG_IMPLANT | Freq: Once | SUBCUTANEOUS | Status: AC
  Administered 2017-03-17: 10.8 mg via SUBCUTANEOUS
  Filled 2017-03-17: qty 10.8

## 2017-03-17 NOTE — Progress Notes (Signed)
Patient Care Team: Darlyne Russian, MD as PCP - General (Family Medicine) Nicholas Lose, MD as Consulting Physician (Hematology and Oncology)  DIAGNOSIS:  Encounter Diagnosis  Name Primary?  . Malignant neoplasm of lower-outer quadrant of right breast of female, estrogen receptor positive ([place])     SUMMARY OF ONCOLOGIC HISTORY:   Breast cancer of lower-outer quadrant of right female breast ([place])   09/08/2012 Initial Diagnosis    Breast cancer of lower-outer quadrant of right female breast: ER 99% PR 100% HER-2 negative, Ki-67 70%      10/07/2012 - 03/17/2013 Neo-Adjuvant Chemotherapy    Dose dense FEC followed by weekly Taxol      04/06/2013 Surgery    Right breast lumpectomy residual 1.1 cm IDC grade 3 with high-grade DCIS, PNI +ve, 1/2 SLN positive, no MVI, T1 C. N1 A. stage IIA, ER 97% PR 95% HER-2 1.1 ratio negative      05/08/2013 - 06/22/2013 Radiation Therapy    Radiation therapy to lumpectomy site      07/03/2013 -  Anti-estrogen oral therapy    Zoladex plus Aromasin       Procedure    Genetic testing:PTEN C.-(1088_1063)del 26 on [employer] testing       CHIEF COMPLIANT: Follow-up on Zoladex and Aromasin  INTERVAL HISTORY: Mary Dougherty is a 47 year old with above-mentioned history of right breast cancer currently on antiestrogen therapy with Zoladex and Aromasin.  Patient is considering getting a hysterectomy and salpingo-oophorectomy.  This may enable her to discontinue Zoladex injections.  She tells me a lot has happened in her family with the loss of her father and her 2 cousins.  REVIEW OF SYSTEMS:   Constitutional: Denies fevers, chills or abnormal weight loss Eyes: Denies blurriness of vision Ears, nose, mouth, throat, and face: Denies mucositis or sore throat Respiratory: Denies cough, dyspnea or wheezes Cardiovascular: Denies palpitation, chest discomfort Gastrointestinal:  Denies nausea, heartburn or change in bowel habits Skin: Denies abnormal skin  rashes Lymphatics: Denies new lymphadenopathy or easy bruising Neurological:Denies numbness, tingling or new weaknesses Behavioral/Psych: Mood is stable, no new changes  Extremities: No lower extremity edema Breast:  denies any pain or lumps or nodules in either breasts All other systems were reviewed with the patient and are negative.  I have reviewed the past medical history, past surgical history, social history and family history with the patient and they are unchanged from previous note.  ALLERGIES:  has No Known Allergies.  MEDICATIONS:  Current Outpatient Medications  Medication Sig Dispense Refill  . azithromycin (ZITHROMAX Z-PAK) 250 MG tablet Take 2 tabs on day one then one tablet each day after 6 each 0  . exemestane (AROMASIN) 25 MG tablet TAKE 1 TABLET BY MOUTH EVERY DAY AFTER BREAKFAST 90 tablet 3  . fluticasone (FLONASE) 50 MCG/ACT nasal spray Place 1 spray into both nostrils daily. 16 g 2  . lidocaine-prilocaine (EMLA) cream Apply 1 application topically as needed. 30 g 0  . loratadine (CLARITIN) 10 MG tablet TAKE 1 TABLET BY MOUTH EVERY DAY 30 tablet 3   No current facility-administered medications for this visit.     PHYSICAL EXAMINATION: ECOG PERFORMANCE STATUS: 1 - Symptomatic but completely ambulatory  Vitals:   03/17/17 1320  BP: (!) 146/72  Pulse: 88  Resp: 19  Temp: 98.2 F (36.8 C)  SpO2: 98%   Filed Weights   03/17/17 1320  Weight: 196 lb 9.6 oz (89.2 kg)    GENERAL:alert, no distress and comfortable SKIN: skin color,  texture, turgor are normal, no rashes or significant lesions EYES: normal, Conjunctiva are pink and non-injected, sclera clear OROPHARYNX:no exudate, no erythema and lips, buccal mucosa, and tongue normal  NECK: supple, thyroid normal size, non-tender, without nodularity LYMPH:  no palpable lymphadenopathy in the cervical, axillary or inguinal LUNGS: clear to auscultation and percussion with normal breathing effort HEART: regular  rate & rhythm and no murmurs and no lower extremity edema ABDOMEN:abdomen soft, non-tender and normal bowel sounds MUSCULOSKELETAL:no cyanosis of digits and no clubbing  NEURO: alert & oriented x 3 with fluent speech, no focal motor/sensory deficits EXTREMITIES: No lower extremity edema BREAST: No palpable masses or nodules in either right or left breasts. No palpable axillary supraclavicular or infraclavicular adenopathy no breast tenderness or nipple discharge. (exam performed in the presence of a chaperone)  LABORATORY DATA:  I have reviewed the data as listed CMP Latest Ref Rng & Units 12/04/2015 03/08/2015 01/25/2015  Glucose 65 - 99 mg/dL 212(H) 189(H) 111(H)  BUN 6 - 20 mg/dL 8 12.6 11  Creatinine 0.44 - 1.00 mg/dL 0.76 0.8 0.54  Sodium 135 - 145 mmol/L 136 141 139  Potassium 3.5 - 5.1 mmol/L 3.6 4.2 4.2  Chloride 101 - 111 mmol/L 102 - 106  CO2 22 - 32 mmol/L '26 26 27  '$ Calcium 8.9 - 10.3 mg/dL 8.6(L) 9.3 9.1  Total Protein 6.4 - 8.3 g/dL - 7.8 7.1  Total Bilirubin 0.20 - 1.20 mg/dL - 1.45(H) 1.0  Alkaline Phos 40 - 150 U/L - 112 103  AST 5 - 34 U/L - 14 16  ALT 0 - 55 U/L - 19 21    Lab Results  Component Value Date   WBC 7.2 12/04/2015   HGB 9.6 (L) 12/04/2015   HCT 28.3 (L) 12/04/2015   MCV 80.6 12/04/2015   PLT 180 12/04/2015   NEUTROABS 4.7 12/04/2015    ASSESSMENT & PLAN:  Breast cancer of lower-outer quadrant of right female breast Right breast invasive ductal carcinoma ER/PR positive HER-2 negative stage II A. T3, N1, M0 with PTEN mutation Current treatment: Adjuvant Zoladex and exemestane started 07/03/2013, plan is for 5 years; changed to every 3 month Zoladex injection from May 2016  Toxicities of therapy: 1. Hot flashes : Much better 2. Injection site pain patient uses ice cube prior to injection  Surveillance for breast cancer: 1. Breast exam 03/17/2017 normal 2. Mammogram 10/15/2016 normal, Density cat B 3. MRI January 2015 revealed changes in the  right breast, most likely postsurgical in nature 4. Bone density done July 2015 was normal next bone density will need to be done this year.  The patient's father and 2 cousins passed away recently.  She has been working hard around the house to help clean up the 50 acres of land that their family owns.  Return to clinic in 1 year for follow-up and q 3 months for Zoladex injections.  If she does get hysterectomy and salpingo-nephrectomy, we can discontinue Zoladex.     I spent 25 minutes talking to the patient of which more than half was spent in counseling and coordination of care.  No orders of the defined types were placed in this encounter.  The patient has a good understanding of the overall plan. she agrees with it. she will call with any problems that may develop before the next visit here.   Harriette Ohara, MD 03/17/17

## 2017-03-17 NOTE — Telephone Encounter (Signed)
Gave avs n

## 2017-03-17 NOTE — Patient Instructions (Signed)
Goserelin injection What is this medicine? GOSERELIN (GOE se rel in) is similar to a hormone found in the body. It lowers the amount of sex hormones that the body makes. Men will have lower testosterone levels and women will have lower estrogen levels while taking this medicine. In men, this medicine is used to treat prostate cancer; the injection is either given once per month or once every 12 weeks. A once per month injection (only) is used to treat women with endometriosis, dysfunctional uterine bleeding, or advanced breast cancer. This medicine may be used for other purposes; ask your health care provider or pharmacist if you have questions. COMMON BRAND NAME(S): Zoladex What should I tell my health care provider before I take this medicine? They need to know if you have any of these conditions (some only apply to women): -diabetes -heart disease or previous heart attack -high blood pressure -high cholesterol -kidney disease -osteoporosis or low bone density -problems passing urine -spinal cord injury -stroke -tobacco smoker -an unusual or allergic reaction to goserelin, hormone therapy, other medicines, foods, dyes, or preservatives -pregnant or trying to get pregnant -breast-feeding How should I use this medicine? This medicine is for injection under the skin. It is given by a health care professional in a hospital or clinic setting. Men receive this injection once every 4 weeks or once every 12 weeks. Women will only receive the once every 4 weeks injection. Talk to your pediatrician regarding the use of this medicine in children. Special care may be needed. Overdosage: If you think you have taken too much of this medicine contact a poison control center or emergency room at once. NOTE: This medicine is only for you. Do not share this medicine with others. What if I miss a dose? It is important not to miss your dose. Call your doctor or health care professional if you are unable to  keep an appointment. What may interact with this medicine? -female hormones like estrogen -herbal or dietary supplements like black cohosh, chasteberry, or DHEA -female hormones like testosterone -prasterone This list may not describe all possible interactions. Give your health care provider a list of all the medicines, herbs, non-prescription drugs, or dietary supplements you use. Also tell them if you smoke, drink alcohol, or use illegal drugs. Some items may interact with your medicine. What should I watch for while using this medicine? Visit your doctor or health care professional for regular checks on your progress. Your symptoms may appear to get worse during the first weeks of this therapy. Tell your doctor or healthcare professional if your symptoms do not start to get better or if they get worse after this time. Your bones may get weaker if you take this medicine for a long time. If you smoke or frequently drink alcohol you may increase your risk of bone loss. A family history of osteoporosis, chronic use of drugs for seizures (convulsions), or corticosteroids can also increase your risk of bone loss. Talk to your doctor about how to keep your bones strong. This medicine should stop regular monthly menstration in women. Tell your doctor if you continue to menstrate. Women should not become pregnant while taking this medicine or for 12 weeks after stopping this medicine. Women should inform their doctor if they wish to become pregnant or think they might be pregnant. There is a potential for serious side effects to an unborn child. Talk to your health care professional or pharmacist for more information. Do not breast-feed an infant while taking   this medicine. Men should inform their doctors if they wish to father a child. This medicine may lower sperm counts. Talk to your health care professional or pharmacist for more information. What side effects may I notice from receiving this  medicine? Side effects that you should report to your doctor or health care professional as soon as possible: -allergic reactions like skin rash, itching or hives, swelling of the face, lips, or tongue -bone pain -breathing problems -changes in vision -chest pain -feeling faint or lightheaded, falls -fever, chills -pain, swelling, warmth in the leg -pain, tingling, numbness in the hands or feet -signs and symptoms of low blood pressure like dizziness; feeling faint or lightheaded, falls; unusually weak or tired -stomach pain -swelling of the ankles, feet, hands -trouble passing urine or change in the amount of urine -unusually high or low blood pressure -unusually weak or tired Side effects that usually do not require medical attention (report to your doctor or health care professional if they continue or are bothersome): -change in sex drive or performance -changes in breast size in both males and females -changes in emotions or moods -headache -hot flashes -irritation at site where injected -loss of appetite -skin problems like acne, dry skin -vaginal dryness This list may not describe all possible side effects. Call your doctor for medical advice about side effects. You may report side effects to FDA at 1-800-FDA-1088. Where should I keep my medicine? This drug is given in a hospital or clinic and will not be stored at home. NOTE: This sheet is a summary. It may not cover all possible information. If you have questions about this medicine, talk to your doctor, pharmacist, or health care provider.  2015, [employer]. (2013-03-28 11:10:35)  

## 2017-03-17 NOTE — Assessment & Plan Note (Signed)
Right breast invasive ductal carcinoma ER/PR positive HER-2 negative stage II A. T3, N1, M0 with PTEN mutation Current treatment: Adjuvant Zoladex and exemestane started 07/03/2013, plan is for 5 years; changed to every 3 month Zoladex injection from May 2016  Toxicities of therapy: 1. Hot flashes : Much better 2. Injection site pain patient uses ice cube prior to injection  Surveillance for breast cancer: 1. Breast exam 03/17/2017 normal 2. Mammogram 10/15/2016 normal, Density cat B 3. MRI January 2015 revealed changes in the right breast, most likely postsurgical in nature 4. Bone density done July 2015 was normal next bone density will need to be done this year.   Return to clinic in 1 year for follow-up and q 3 months for Zoladex injections.

## 2017-04-15 ENCOUNTER — Other Ambulatory Visit: Payer: Self-pay | Admitting: Radiology

## 2017-04-20 ENCOUNTER — Other Ambulatory Visit: Payer: Self-pay | Admitting: General Surgery

## 2017-04-20 DIAGNOSIS — C50411 Malignant neoplasm of upper-outer quadrant of right female breast: Secondary | ICD-10-CM

## 2017-04-22 ENCOUNTER — Ambulatory Visit
Admission: RE | Admit: 2017-04-22 | Discharge: 2017-04-22 | Disposition: A | Discharge status: Home / Self Care | Payer: BLUE CROSS/BLUE SHIELD | Source: Ambulatory Visit | Attending: General Surgery | Admitting: General Surgery

## 2017-04-22 DIAGNOSIS — C50411 Malignant neoplasm of upper-outer quadrant of right female breast: Secondary | ICD-10-CM

## 2017-04-22 MED ORDER — GADOBENATE DIMEGLUMINE 529 MG/ML IV SOLN
19.0000 mL | Freq: Once | INTRAVENOUS | Status: AC | PRN
  Administered 2017-04-22: 19 mL via INTRAVENOUS

## 2017-04-27 ENCOUNTER — Other Ambulatory Visit: Payer: Self-pay | Admitting: General Surgery

## 2017-04-27 ENCOUNTER — Inpatient Hospital Stay: Payer: BLUE CROSS/BLUE SHIELD | Attending: Hematology and Oncology | Admitting: Hematology and Oncology

## 2017-04-27 VITALS — BP 123/65 | HR 90 | Temp 98.4°F | Resp 18 | Ht 65.0 in | Wt 196.6 lb

## 2017-04-27 DIAGNOSIS — Z170 Estrogen receptor positive status [ER+]: Secondary | ICD-10-CM

## 2017-04-27 DIAGNOSIS — C50511 Malignant neoplasm of lower-outer quadrant of right female breast: Secondary | ICD-10-CM | POA: Insufficient documentation

## 2017-04-27 NOTE — Assessment & Plan Note (Signed)
Breast cancer of lower-outer quadrant of right female breast Right breast invasive ductal carcinoma ER/PR positive HER-2 negative stage II A. T3, N1, M0 with PTEN mutation Prior treatment: Adjuvant Zoladex and exemestane started 07/03/2013, plan is for 5 years; changed to every 3 month Zoladex injection from May 2016 ------------------------------------------------------------------------------ Breast MRI 04/22/2017: 2.3 cm malignancy lower outer quadrant right breast within the lumpectomy scar  Recommendation: 1.  Mastectomy followed by  2. adjuvant chemotherapy 3.  The patient undergoes mastectomy there is no role of radiation. 4.  Resume antiestrogen therapy   Return to clinic after surgery to discuss final pathology report

## 2017-04-27 NOTE — Progress Notes (Signed)
Patient Care Team: Forrest Moron, MD as PCP - General (Internal Medicine) Nicholas Lose, MD as Consulting Physician (Hematology and Oncology)  DIAGNOSIS:  Encounter Diagnosis  Name Primary?  . Malignant neoplasm of lower-outer quadrant of right breast of female, estrogen receptor positive ([place])     SUMMARY OF ONCOLOGIC HISTORY:   Breast cancer of lower-outer quadrant of right female breast ([place])   09/08/2012 Initial Diagnosis    Breast cancer of lower-outer quadrant of right female breast: ER 99% PR 100% HER-2 negative, Ki-67 70%      10/07/2012 - 03/17/2013 Neo-Adjuvant Chemotherapy    Dose dense FEC followed by weekly Taxol      04/06/2013 Surgery    Right breast lumpectomy residual 1.1 cm IDC grade 3 with high-grade DCIS, PNI +ve, 1/2 SLN positive, no MVI, T1 C. N1 A. stage IIA, ER 97% PR 95% HER-2 1.1 ratio negative      05/08/2013 - 06/22/2013 Radiation Therapy    Radiation therapy to lumpectomy site      07/03/2013 - 04/27/2017 Anti-estrogen oral therapy    Zoladex plus Aromasin       Procedure    Genetic testing:PTEN C.-([id number])del 26 on [employer] testing      04/15/2017 Relapse/Recurrence    Right breast biopsy: Invasive ductal carcinoma, grade 3, ER 10%, PR 0%, Ki-67 30%, HER-2 equivocal, ratio 1.36, copy #5.2, IHC 1+ negative      04/22/2017 Breast MRI    Malignancy LOQ right breast 2.3 cm within the lumpectomy scar, left breast normal       CHIEF COMPLIANT: Follow-up to discuss neoadjuvant chemotherapy  INTERVAL HISTORY: Mary Dougherty is a 47 year old with above-mentioned history of right breast cancer treated with neoadjuvant chemotherapy followed by lumpectomy and radiation along with antiestrogen therapy with Zoladex.  She presented with recurrence of breast cancer in the right breast and was biopsy-proven to be invasive ductal carcinoma grade 3 that is ER weakly positive at 10% PR negative and HER-2 was equivocal by FISH but IHC was Neg. she is here to  discuss during neoadjuvant chemotherapy to shrink the tumor.  REVIEW OF SYSTEMS:   Constitutional: Denies fevers, chills or abnormal weight loss Eyes: Denies blurriness of vision Ears, nose, mouth, throat, and face: Denies mucositis or sore throat Respiratory: Denies cough, dyspnea or wheezes Cardiovascular: Denies palpitation, chest discomfort Gastrointestinal:  Denies nausea, heartburn or change in bowel habits Skin: Denies abnormal skin rashes Lymphatics: Denies new lymphadenopathy or easy bruising Neurological:Denies numbness, tingling or new weaknesses Behavioral/Psych: Mood is stable, no new changes  Extremities: No lower extremity edema  All other systems were reviewed with the patient and are negative.  I have reviewed the past medical history, past surgical history, social history and family history with the patient and they are unchanged from previous note.  ALLERGIES:  has No Known Allergies.  MEDICATIONS:  Current Outpatient Medications  Medication Sig Dispense Refill  . exemestane (AROMASIN) 25 MG tablet TAKE 1 TABLET BY MOUTH EVERY DAY AFTER BREAKFAST 90 tablet 3  . lidocaine-prilocaine (EMLA) cream Apply 1 application topically as needed. 30 g 0  . loratadine (CLARITIN) 10 MG tablet TAKE 1 TABLET BY MOUTH EVERY DAY 30 tablet 3   No current facility-administered medications for this visit.     PHYSICAL EXAMINATION: ECOG PERFORMANCE STATUS: 1 - Symptomatic but completely ambulatory  Vitals:   04/27/17 1442  BP: 123/65  Pulse: 90  Resp: 18  Temp: 98.4 F (36.9 C)  SpO2: 99%  Filed Weights   04/27/17 1442  Weight: 196 lb 9.6 oz (89.2 kg)    GENERAL:alert, no distress and comfortable SKIN: skin color, texture, turgor are normal, no rashes or significant lesions EYES: normal, Conjunctiva are pink and non-injected, sclera clear OROPHARYNX:no exudate, no erythema and lips, buccal mucosa, and tongue normal  NECK: supple, thyroid normal size, non-tender,  without nodularity LYMPH:  no palpable lymphadenopathy in the cervical, axillary or inguinal LUNGS: clear to auscultation and percussion with normal breathing effort HEART: regular rate & rhythm and no murmurs and no lower extremity edema ABDOMEN:abdomen soft, non-tender and normal bowel sounds MUSCULOSKELETAL:no cyanosis of digits and no clubbing  NEURO: alert & oriented x 3 with fluent speech, no focal motor/sensory deficits EXTREMITIES: No lower extremity edema   LABORATORY DATA:  I have reviewed the data as listed CMP Latest Ref Rng & Units 12/04/2015 03/08/2015 01/25/2015  Glucose 65 - 99 mg/dL 212(H) 189(H) 111(H)  BUN 6 - 20 mg/dL 8 12.6 11  Creatinine 0.44 - 1.00 mg/dL 0.76 0.8 0.54  Sodium 135 - 145 mmol/L 136 141 139  Potassium 3.5 - 5.1 mmol/L 3.6 4.2 4.2  Chloride 101 - 111 mmol/L 102 - 106  CO2 22 - 32 mmol/L _0 Calcium 8.9 - 10.3 mg/dL 8.6(L) 9.3 9.1  Total Protein 6.4 - 8.3 g/dL - 7.8 7.1  Total Bilirubin 0.20 - 1.20 mg/dL - 1.45(H) 1.0  Alkaline Phos 40 - 150 U/L - 112 103  AST 5 - 34 U/L - 14 16  ALT 0 - 55 U/L - 19 21    Lab Results  Component Value Date   WBC 7.2 12/04/2015   HGB 9.6 (L) 12/04/2015   HCT 28.3 (L) 12/04/2015   MCV 80.6 12/04/2015   PLT 180 12/04/2015   NEUTROABS 4.7 12/04/2015    ASSESSMENT & PLAN:  Breast cancer of lower-outer quadrant of right female breast Right breast invasive ductal carcinoma ER/PR positive HER-2 negative stage II A. T3, N1, M0 with PTEN mutation Prior treatment: Adjuvant Zoladex and exemestane started 07/03/2013, plan is for 5 years; changed to every 3 month Zoladex injection from May 2016 ------------------------------------------------------------------------------ Breast MRI 04/22/2017: 2.3 cm malignancy lower outer quadrant right breast within the lumpectomy scar  Recommendation: 1.  Neoadjuvant chemotherapy with carboplatin and gemcitabine day 1 and 8 every 3 weeks for 6 cycles 2. followed by  mastectomy 3.  Followed by continued antiestrogen therapy  PET/CT scan for staging  :Chemotherapy Counseling: I discussed the risks and benefits of chemotherapy including the risks of nausea/ vomiting, risk of infection from low WBC count, fatigue due to chemo or anemia, bruising or bleeding due to low platelets, mouth sores, loss/ change in taste and decreased appetite. Liver and kidney function will be monitored through out chemotherapy as abnormalities in liver and kidney function may be a side effect of treatment. Risk of permanent bone marrow dysfunction due to chemo were also discussed.  Return to clinic to start neoadjuvant chemotherapy soon as a port is in place.   I spent 25 minutes talking to the patient of which more than half was spent in counseling and coordination of care.  Orders Placed This Encounter  Procedures  . NM PET Image Restag (PS) Skull Base To Thigh    Standing Status:   Future    Standing Expiration Date:   04/27/2018    Order Specific Question:   If indicated for the ordered procedure, I authorize the administration of a radiopharmaceutical per  Radiology protocol    Answer:   Yes    Order Specific Question:   Is the patient pregnant?    Answer:   No    Order Specific Question:   Preferred imaging location?    Answer:   [facility]    Order Specific Question:   Radiology Contrast Protocol - do NOT remove file path    Answer:   [email or web address]    Order Specific Question:   Reason for Exam additional comments    Answer:   relapsed recurrent breast cancer   The patient has a good understanding of the overall plan. she agrees with it. she will call with any problems that may develop before the next visit here.   Harriette Ohara, MD 04/28/17

## 2017-04-28 DIAGNOSIS — C50511 Malignant neoplasm of lower-outer quadrant of right female breast: Secondary | ICD-10-CM | POA: Insufficient documentation

## 2017-04-28 DIAGNOSIS — Z170 Estrogen receptor positive status [ER+]: Secondary | ICD-10-CM

## 2017-04-28 MED ORDER — LORAZEPAM 0.5 MG PO TABS: 0.5000 mg | ORAL_TABLET | Freq: Every evening | ORAL | 0 refills | Status: DC | PRN

## 2017-04-28 MED ORDER — PROCHLORPERAZINE MALEATE 10 MG PO TABS: 10.0000 mg | ORAL_TABLET | Freq: Four times a day (QID) | ORAL | 1 refills | Status: DC | PRN

## 2017-04-28 MED ORDER — ONDANSETRON HCL 8 MG PO TABS: 8.0000 mg | ORAL_TABLET | Freq: Two times a day (BID) | ORAL | 1 refills | Status: DC | PRN

## 2017-04-28 MED ORDER — LIDOCAINE-PRILOCAINE 2.5-2.5 % EX CREA: TOPICAL_CREAM | CUTANEOUS | 3 refills | Status: DC

## 2017-04-28 NOTE — Progress Notes (Signed)
START OFF PATHWAY REGIMEN - Breast   [id number]:Gemcitabine + Carboplatin (1000/2) q21 Days:   A cycle is every 21 days:     Gemcitabine      Carboplatin   **Always confirm dose/schedule in your pharmacy ordering system**    Patient Characteristics: Preoperative or Nonsurgical Candidate (Clinical Staging), Neoadjuvant Therapy followed by Surgery, Invasive Disease, Chemotherapy, HER2 Negative/Unknown/Equivocal, ER Positive Therapeutic Status: Preoperative or Nonsurgical Candidate (Clinical Staging) AJCC M Category: cM0 AJCC Grade: G3 Breast Surgical Plan: Neoadjuvant Therapy followed by Surgery ER Status: Positive (+) AJCC 8 Stage Grouping: IIB HER2 Status: Negative (-) AJCC T Category: cT2 AJCC N Category: cN0 PR Status: Negative (-) Intent of Therapy: Curative Intent, Discussed with Patient

## 2017-04-29 ENCOUNTER — Other Ambulatory Visit: Payer: Self-pay | Admitting: Hematology and Oncology

## 2017-04-29 ENCOUNTER — Ambulatory Visit: Payer: Self-pay

## 2017-04-29 ENCOUNTER — Telehealth: Payer: Self-pay | Admitting: *Deleted

## 2017-04-29 ENCOUNTER — Telehealth: Payer: Self-pay

## 2017-04-29 NOTE — Telephone Encounter (Signed)
Called pt and LVM to let her know of her PET Scan appt next Tuesday. Requested for pt to call back and confirm receipt of message. Pt to arrive at [facility] on 05/04/17 at 7am. NPO after midnight, no sugars, gum or candy.

## 2017-04-29 NOTE — Telephone Encounter (Signed)
Pt. Had a random fingerstick at a health fair last Saturday - 376. She is not diagnosed with diabetes. She has been running greater than 200 fasting every morning this week. This morning it was 301. No other symptoms noted. Appointment made for 05/01/17.  Reason for Disposition . New onset Diabetes suspected (e.g., frequent urination, weakness, weight loss)  Answer Assessment - Initial Assessment Questions 1. BLOOD GLUCOSE: "What is your blood glucose level?"      301 2. ONSET: "When did you check the blood glucose?"     Before breakfast 3. USUAL RANGE: "What is your glucose level usually?" (e.g., usual fasting morning value, usual evening value)     Not diagnosed diabetic 4. KETONES: "Do you check for ketones (urine or blood test strips)?" If yes, ask: "What does the test show now?"      No 5. TYPE 1 or 2:  "Do you know what type of diabetes you have?"  (e.g., Type 1, Type 2, Gestational; doesn't know)      No  6. INSULIN: "Do you take insulin?" If yes, ask: "Have you missed any shots recently?"     No 7. DIABETES PILLS: "Do you take any pills for your diabetes?" If yes, ask: "Have you missed taking any pills recently?"     No 8. OTHER SYMPTOMS: "Do you have any symptoms?" (e.g., fever, frequent urination, difficulty breathing, dizziness, weakness, vomiting)     No 9. PREGNANCY: "Is there any chance you are pregnant?" "When was your last menstrual period?"     No  Protocols used: DIABETES - HIGH BLOOD SUGAR-A-AH

## 2017-04-29 NOTE — Telephone Encounter (Signed)
Left vm regarding PET scan and port. Contact information provided for return call.

## 2017-04-30 ENCOUNTER — Telehealth: Payer: Self-pay | Admitting: Hematology and Oncology

## 2017-04-30 NOTE — Telephone Encounter (Signed)
Spoke to patient regarding upcoming April and may appointments per 3/28 sch message.

## 2017-05-01 ENCOUNTER — Encounter: Payer: Self-pay | Admitting: Family Medicine

## 2017-05-01 ENCOUNTER — Other Ambulatory Visit: Payer: Self-pay

## 2017-05-01 ENCOUNTER — Ambulatory Visit: Payer: BLUE CROSS/BLUE SHIELD | Admitting: Family Medicine

## 2017-05-01 VITALS — BP 126/72 | HR 80 | Temp 98.0°F | Resp 16 | Ht 65.95 in | Wt 193.0 lb

## 2017-05-01 DIAGNOSIS — E119 Type 2 diabetes mellitus without complications: Secondary | ICD-10-CM

## 2017-05-01 DIAGNOSIS — C50511 Malignant neoplasm of lower-outer quadrant of right female breast: Secondary | ICD-10-CM

## 2017-05-01 DIAGNOSIS — R739 Hyperglycemia, unspecified: Secondary | ICD-10-CM

## 2017-05-01 DIAGNOSIS — D580 Hereditary spherocytosis: Secondary | ICD-10-CM

## 2017-05-01 DIAGNOSIS — M25512 Pain in left shoulder: Secondary | ICD-10-CM | POA: Diagnosis not present

## 2017-05-01 DIAGNOSIS — Z23 Encounter for immunization: Secondary | ICD-10-CM

## 2017-05-01 LAB — POCT URINALYSIS DIP (MANUAL ENTRY)
Bilirubin, UA: NEGATIVE
Blood, UA: NEGATIVE
Glucose, UA: NEGATIVE mg/dL
Ketones, POC UA: NEGATIVE mg/dL
NITRITE UA: NEGATIVE
PH UA: 5.5 (ref 5.0–8.0)
PROTEIN UA: NEGATIVE mg/dL
Spec Grav, UA: 1.01 (ref 1.010–1.025)
Urobilinogen, UA: 1 E.U./dL

## 2017-05-01 LAB — POCT CBC
GRANULOCYTE PERCENT: 70.5 % (ref 37–80)
HEMATOCRIT: 30.4 % — AB (ref 37.7–47.9)
Hemoglobin: 10.8 g/dL — AB (ref 12.2–16.2)
Lymph, poc: 2.8 (ref 0.6–3.4)
MCH, POC: 28.6 pg (ref 27–31.2)
MCHC: 35.4 g/dL (ref 31.8–35.4)
MCV: 80.7 fL (ref 80–97)
MID (CBC): 0.6 (ref 0–0.9)
MPV: 7.7 fL (ref 0–99.8)
POC GRANULOCYTE: 8.1 — AB (ref 2–6.9)
POC LYMPH PERCENT: 24 %L (ref 10–50)
POC MID %: 5.5 % (ref 0–12)
Platelet Count, POC: 241 10*3/uL (ref 142–424)
RBC: 3.76 M/uL — AB (ref 4.04–5.48)
RDW, POC: 20.7 %
WBC: 11.5 10*3/uL — AB (ref 4.6–10.2)

## 2017-05-01 LAB — POCT GLYCOSYLATED HEMOGLOBIN (HGB A1C): HEMOGLOBIN A1C: 7.9

## 2017-05-01 MED ORDER — NAPROXEN 500 MG PO TABS: 500.0000 mg | ORAL_TABLET | Freq: Two times a day (BID) | ORAL | 0 refills | Status: DC

## 2017-05-01 MED ORDER — GLUCOSE BLOOD VI STRP: ORAL_STRIP | 12 refills | Status: DC

## 2017-05-01 MED ORDER — LANCING DEVICE MISC: 0 refills | Status: DC

## 2017-05-01 MED ORDER — BLOOD GLUCOSE MONITORING SUPPL W/DEVICE KIT: PACK | 0 refills | Status: DC

## 2017-05-01 MED ORDER — LANCETS ULTRA THIN 30G MISC: 3 refills | Status: DC

## 2017-05-01 MED ORDER — METFORMIN HCL 500 MG PO TABS: 500.0000 mg | ORAL_TABLET | Freq: Every day | ORAL | 0 refills | Status: DC

## 2017-05-01 NOTE — Patient Instructions (Addendum)
Apply aspercreme with lidocaine to the painful areas You can take naproxen '500mg'$  with food   Follow up with the chiropractor as you planned  Return to clinic in one month for teaching on how to check blood glucose Will order testing supplies   IF you received an x-ray today, you will receive an invoice from [facility]. Please contact [facility] at [phone] with questions or concerns regarding your invoice.   IF you received labwork today, you will receive an invoice from [facility]. Please contact [facility] at [phone] with questions or concerns regarding your invoice.   Our billing staff will not be able to assist you with questions regarding bills from these companies.  You will be contacted with the lab results as soon as they are available. The fastest way to get your results is to activate your My Chart account. Instructions are located on the last page of this paperwork. If you have not heard from [place] regarding the results in 2 weeks, please contact this office.      Diabetes Mellitus and Standards of Medical Care Managing diabetes (diabetes mellitus) can be complicated. Your diabetes treatment may be managed by a team of health care providers, including:  A diet and nutrition specialist (registered dietitian).  A nurse.  A certified diabetes educator (CDE).  A diabetes specialist (endocrinologist).  An eye doctor.  A primary care provider.  A dentist.  Your health care providers follow a schedule in order to help you get the best quality of care. The following schedule is a general guideline for your diabetes management plan. Your health care providers may also give you more specific instructions. HbA1c ( hemoglobin A1c) test This test provides information about blood sugar (glucose) control over the previous 2-3 months. It is used to check whether your diabetes management plan needs to be adjusted.  If you are meeting your treatment goals,  this test is done at least 2 times a year.  If you are not meeting treatment goals or if your treatment goals have changed, this test is done 4 times a year.  Blood pressure test  This test is done at every routine medical visit. For most people, the goal is less than 130/80. Ask your health care provider what your goal blood pressure should be. Dental and eye exams  Visit your dentist two times a year.  If you have type 1 diabetes, get an eye exam 3-5 years after you are diagnosed, and then once a year after your first exam. ? If you were diagnosed with type 1 diabetes as a child, get an eye exam when you are age 51 or older and have had diabetes for 3-5 years. After the first exam, you should get an eye exam once a year.  If you have type 2 diabetes, have an eye exam as soon as you are diagnosed, and then once a year after your first exam. Foot care exam  Visual foot exams are done at every routine medical visit. The exams check for cuts, bruises, redness, blisters, sores, or other problems with the feet.  A complete foot exam is done by your health care provider once a year. This exam includes an inspection of the structure and skin of your feet, and a check of the pulses and sensation in your feet. ? Type 1 diabetes: Get your first exam 3-5 years after diagnosis. ? Type 2 diabetes: Get your first exam as soon as you are diagnosed.  Check your feet every day  for cuts, bruises, redness, blisters, or sores. If you have any of these or other problems that are not healing, contact your health care provider. Kidney function test ( urine microalbumin)  This test is done once a year. ? Type 1 diabetes: Get your first test 5 years after diagnosis. ? Type 2 diabetes: Get your first test as soon as you are diagnosed.  If you have chronic kidney disease (CKD), get a serum creatinine and estimated glomerular filtration rate (eGFR) test once a year. Lipid profile (cholesterol, HDL, LDL,  triglycerides)  This test should be done when you are diagnosed with diabetes, and every 5 years after the first test. If you are on medicines to lower your cholesterol, you may need to get this test done every year. ? The goal for LDL is less than 100 mg/dL (5.5 mmol/L). If you are at high risk, the goal is less than 70 mg/dL (3.9 mmol/L). ? The goal for HDL is 40 mg/dL (2.2 mmol/L) for men and 50 mg/dL(2.8 mmol/L) for women. An HDL cholesterol of 60 mg/dL (3.3 mmol/L) or higher gives some protection against heart disease. ? The goal for triglycerides is less than 150 mg/dL (8.3 mmol/L). Immunizations  The yearly flu (influenza) vaccine is recommended for everyone 6 months or older who has diabetes.  The pneumonia (pneumococcal) vaccine is recommended for everyone 2 years or older who has diabetes. If you are 51 or older, you may get the pneumonia vaccine as a series of two separate shots.  The hepatitis B vaccine is recommended for adults shortly after they have been diagnosed with diabetes.  The Tdap (tetanus, diphtheria, and pertussis) vaccine should be given: ? According to normal childhood vaccination schedules, for children. ? Every 10 years, for adults who have diabetes.  The shingles vaccine is recommended for people who have had chicken pox and are 50 years or older. Mental and emotional health  Screening for symptoms of eating disorders, anxiety, and depression is recommended at the time of diagnosis and afterward as needed. If your screening shows that you have symptoms (you have a positive screening result), you may need further evaluation and be referred to a mental health care provider. Diabetes self-management education  Education about how to manage your diabetes is recommended at diagnosis and ongoing as needed. Treatment plan  Your treatment plan will be reviewed at every medical visit. Summary  Managing diabetes (diabetes mellitus) can be complicated. Your diabetes  treatment may be managed by a team of health care providers.  Your health care providers follow a schedule in order to help you get the best quality of care.  Standards of care including having regular physical exams, blood tests, blood pressure monitoring, immunizations, screening tests, and education about how to manage your diabetes.  Your health care providers may also give you more specific instructions based on your individual health. This information is not intended to replace advice given to you by your health care provider. Make sure you discuss any questions you have with your health care provider. Document Released: 11/16/2008 Document Revised: 10/18/2015 Document Reviewed: 10/18/2015 Elsevier Interactive Patient Education  [employer].

## 2017-05-01 NOTE — Progress Notes (Signed)
Chief Complaint  Patient presents with  . Elavated Glucose     pt would like to check and make sure she is not dibetic because she has [facility] and will be starting Chemo on May 1. Pt states she went to a health fair last Sat. and her glucose was 345 so she has been concerned     HPI   Diabetes Mellitus Type II, Initial Visit: Patient here for an initial evaluation of Type 2 diabetes mellitus.  She had some abnormal tests at a health fair.  Current symptoms/problems include hyperglycemia and have been worsening. Symptoms have been present for 1 month.    The patient was initially diagnosed with Type 2 diabetes mellitus based on the following criteria:  a1c >6.5%  Known diabetic complications: none Cardiovascular risk factors: obesity (BMI >= 30 kg/m2) and sedentary lifestyle Current diabetic medications include none.   Eye exam current (within one year): no Weight trend: stable Prior visit with dietician: no Current diet: in general, a "healthy" diet   Current exercise: none  Current monitoring regimen: home blood tests - 3-4 times weekly Home blood sugar records: fasting range: elevated around 180-200 Any episodes of hypoglycemia? no  Is She on ACE inhibitor or angiotensin II receptor blocker?  No     Right breast cancer Recurrence of right breast cancer in the lower outer quadrant  Pt denies breast pain Was documented on MRI She will start chemo soon then move on to surgery in the fall  Spherocytosis Has a history of spherocytosis which keeps her anemic States that she gets some lightheadedness No palpitations No heavy bleeding  Left shoulder pain Right handed female with left shoulder pain with range of motion and numbness and tingling when arm is bent to hold the phone for long periods after sleeping on the couch for a while No grasp weakness     Past Medical History:  Diagnosis Date  . Anemia   . Blood transfusion without reported diagnosis   . Chronic  anemia 03/03/13  . Hereditary spherocytosis ([place])   . Invasive ductal carcinoma of right breast ([place]) 09/2012   ER(99%), PR(100%), Ki-67(70%), 1/5 nodes positive  . S/P radiation therapy 05/08/2013-06/22/2013   1) Right breast / 50.4 Gy in 28 fractions / 2) Right Supraclavicular fossa/ 50.4 Gy in 28 fractions /3) Right Posterior Axillary boost / 11.9 Gy in 28 fractions /4) Right breast boost / 10 Gy in 5 fractions   . Spherocytosis ([place]) 1989  . Status post chemotherapy 10/07/12 - 12/16/12    Neoadjuvant chemotherapy with dose dense FEC (5-FU/epirubicin/Cytoxan) from 10/07/2012 through 12/16/12.    . Thyroid disease   . Use of exemestane (Aromasin) 07/25/13  . Use of goserelin acetate (Zoladex) 07/25/13  . Wears glasses     Current Outpatient Medications  Medication Sig Dispense Refill  . exemestane (AROMASIN) 25 MG tablet TAKE 1 TABLET BY MOUTH EVERY DAY AFTER BREAKFAST 90 tablet 3  . lidocaine-prilocaine (EMLA) cream Apply 1 application topically as needed. 30 g 0  . lidocaine-prilocaine (EMLA) cream Apply to affected area once 30 g 3  . Blood Glucose Monitoring Suppl w/Device KIT Use to check blood glucose daily. E11.9 1 each 0  . glucose blood (IGLUCOSE TEST STRIPS) test strip Use as instructed. E11.9. Use to check blood glucose. Dispense type covered by insurance 100 each 12  . Lancet Devices (LANCING DEVICE) MISC Use to check blood glucose once daily. E11.9. Dispense type covered by insurance. 1 each 0  .  LANCETS ULTRA THIN 30G MISC Use to check blood glucose E11.9 100 each 3  . metFORMIN (GLUCOPHAGE) 500 MG tablet Take 1 tablet (500 mg total) by mouth daily with breakfast. 30 tablet 0  . naproxen (NAPROSYN) 500 MG tablet Take 1 tablet (500 mg total) by mouth 2 (two) times daily with a meal. 30 tablet 0   No current facility-administered medications for this visit.     Allergies: No Known Allergies  Past Surgical History:  Procedure Laterality Date  . BREAST LUMPECTOMY WITH SENTINEL  LYMPH NODE BIOPSY Right 04/06/2013   [name]  . BREAST SURGERY     left breast lumpectomy/left ax snbx  . PORT-A-CATH REMOVAL N/A 04/06/2013   Procedure: REMOVAL PORT-A-CATH;  Surgeon: [name], MD;  Location: [facility];  Service: General;  Laterality: N/A;  . PORTACATH PLACEMENT Left 09/20/2012   Procedure: INSERTION PORT-A-CATH;  Surgeon: [name], MD;  Location: [facility];  Service: General;  Laterality: Left;  . WISDOM TOOTH EXTRACTION      Social History   Socioeconomic History  . Marital status: Single    Spouse name: Not on file  . Number of children: Not on file  . Years of education: Not on file  . Highest education level: Not on file  Occupational History  . Not on file  Social Needs  . Financial resource strain: Not on file  . Food insecurity:    Worry: Not on file    Inability: Not on file  . Transportation needs:    Medical: Not on file    Non-medical: Not on file  Tobacco Use  . Smoking status: Never Smoker  . Smokeless tobacco: Never Used  Substance and Sexual Activity  . Alcohol use: No  . Drug use: No  . Sexual activity: Never    Birth control/protection: Abstinence    Comment: menarche age 53, P65,  last menstrual cycle 08/26/2012  Lifestyle  . Physical activity:    Days per week: Not on file    Minutes per session: Not on file  . Stress: Not on file  Relationships  . Social connections:    Talks on phone: Not on file    Gets together: Not on file    Attends religious service: Not on file    Active member of club or organization: Not on file    Attends meetings of clubs or organizations: Not on file    Relationship status: Not on file  Other Topics Concern  . Not on file  Social History Narrative  . Not on file    Family History  Problem Relation Age of Onset  . Pulmonary embolism Maternal Uncle 44       died instantly  . Melanoma Cousin        paternal cousin dx in her 11s  . Heart  disease Father   . Diabetes Father   . Thyroid disease Mother      ROS Review of Systems See HPI Constitution: No fevers or chills No malaise No diaphoresis Skin: No rash or itching Eyes: no blurry vision, no double vision  GU: no dysuria or hematuria Neuro: no dizziness or headaches She has numbness and tingling of her left index finger  all others reviewed and negative   Objective: Vitals:   05/01/17 0840  BP: 126/72  Pulse: 80  Resp: 16  Temp: 98 F (36.7 C)  TempSrc: Oral  SpO2: 97%  Weight: 193 lb (87.5 kg)  Height:  5' 5.95" (1.675 m)   Diabetic Foot Exam - Simple   Simple Foot Form Visual Inspection No deformities, no ulcerations, no other skin breakdown bilaterally:  Yes Sensation Testing Intact to touch and monofilament testing bilaterally:  Yes Pulse Check Posterior Tibialis and Dorsalis pulse intact bilaterally:  Yes Comments      Physical Exam  Physical Exam  Constitutional: She is oriented to person, place, and time. She appears well-developed and well-nourished.  HENT:  Head: Normocephalic and atraumatic.  Eyes: Conjunctivae and EOM are normal.  Cardiovascular: Normal rate, regular rhythm and normal heart sounds.   Pulmonary/Chest: Effort normal and breath sounds normal. No respiratory distress. She has no wheezes.  Abdominal: Normal appearance and bowel sounds are normal. There is no tenderness. There is no CVA tenderness.  Neurological: She is alert and oriented to person, place, and time.   Shoulder exam Right shoulder with crepitus on range of motion Left should glides smoothly without crepitus Left shoulder tender to palpation of the ac joint Pain on the left from range of motion greater than 100 degrees to above the head No effusion   Lab Results  Component Value Date   WBC 11.5 (A) 05/01/2017   HGB 10.8 (A) 05/01/2017   HCT 30.4 (A) 05/01/2017   MCV 80.7 05/01/2017   PLT 180 12/04/2015    Assessment and Plan Jessi was  seen today for elavated glucose .  Diagnoses and all orders for this visit:  Hyperglycemia -     POCT glycosylated hemoglobin (Hb A1C) -     Comprehensive metabolic panel -     POCT urinalysis dipstick  Spherocytosis (HCC)- currently discussed hemoglobin that is slightly reduced -     POCT CBC  New onset type 2 diabetes mellitus ([place])- discussed standards of care for diabetes management -  Updated vaccines Discussed medication metformin and common side effects Advised to take with breakfast -     HM Diabetes Foot Exam -     Microalbumin, urine -     metFORMIN (GLUCOPHAGE) 500 MG tablet; Take 1 tablet (500 mg total) by mouth daily with breakfast. -     Ambulatory referral to diabetic education  Need for prophylactic vaccination and inoculation against viral hepatitis Need for prophylactic vaccination against Streptococcus pneumoniae (pneumococcus) -   Vaccines reviewed and reviewed diabetic guidelines  Malignant neoplasm of lower-outer quadrant of right female breast, unspecified estrogen receptor status ([place])-  Discussed that she will get additional labs with chemo  She should discuss her diabetes with oncology as they often administer high dose steroids  Acute pain of left shoulder- only mild pain with preserved range of motion Discuss nsaids and topical meds (aspercreme) She plans to go to a chiropractor  She will also sleep on a bed instead of the couch   Other orders -     Hepatitis B vaccine adult IM -     Pneumococcal polysaccharide vaccine 23-valent greater than or equal to 2yo subcutaneous/IM -     naproxen (NAPROSYN) 500 MG tablet; Take 1 tablet (500 mg total) by mouth 2 (two) times daily with a meal. -     Blood Glucose Monitoring Suppl w/Device KIT; Use to check blood glucose daily. E11.9 -     LANCETS ULTRA THIN 30G MISC; Use to check blood glucose E11.9 -     Lancet Devices (LANCING DEVICE) MISC; Use to check blood glucose once daily. E11.9. Dispense type covered  by insurance. -     glucose blood (  IGLUCOSE TEST STRIPS) test strip; Use as instructed. E11.9. Use to check blood glucose. Dispense type covered by insurance     [place]

## 2017-05-02 LAB — COMPREHENSIVE METABOLIC PANEL
A/G RATIO: 1.4 (ref 1.2–2.2)
ALK PHOS: 109 IU/L (ref 39–117)
ALT: 29 IU/L (ref 0–32)
AST: 23 IU/L (ref 0–40)
Albumin: 4.2 g/dL (ref 3.5–5.5)
BILIRUBIN TOTAL: 1.4 mg/dL — AB (ref 0.0–1.2)
BUN/Creatinine Ratio: 13 (ref 9–23)
BUN: 9 mg/dL (ref 6–24)
CHLORIDE: 101 mmol/L (ref 96–106)
CO2: 22 mmol/L (ref 20–29)
Calcium: 8.9 mg/dL (ref 8.7–10.2)
Creatinine, Ser: 0.67 mg/dL (ref 0.57–1.00)
GFR calc non Af Amer: 104 mL/min/{1.73_m2} (ref >59)
GFR, EST AFRICAN AMERICAN: 119 mL/min/{1.73_m2} (ref >59)
Globulin, Total: 2.9 g/dL (ref 1.5–4.5)
Glucose: 222 mg/dL — ABNORMAL HIGH (ref 65–99)
POTASSIUM: 4.2 mmol/L (ref 3.5–5.2)
Sodium: 137 mmol/L (ref 134–144)
Total Protein: 7.1 g/dL (ref 6.0–8.5)

## 2017-05-02 LAB — MICROALBUMIN, URINE: MICROALBUM., U, RANDOM: 10.6 ug/mL

## 2017-05-04 ENCOUNTER — Telehealth: Payer: Self-pay | Admitting: Hematology and Oncology

## 2017-05-04 ENCOUNTER — Ambulatory Visit (HOSPITAL_COMMUNITY)
Admission: RE | Admit: 2017-05-04 | Discharge: 2017-05-04 | Disposition: A | Discharge status: Home / Self Care | Payer: BLUE CROSS/BLUE SHIELD | Source: Ambulatory Visit | Attending: Hematology and Oncology | Admitting: Hematology and Oncology

## 2017-05-04 ENCOUNTER — Encounter (HOSPITAL_COMMUNITY): Payer: Self-pay | Admitting: Radiology

## 2017-05-04 DIAGNOSIS — Z170 Estrogen receptor positive status [ER+]: Secondary | ICD-10-CM | POA: Diagnosis not present

## 2017-05-04 DIAGNOSIS — C50511 Malignant neoplasm of lower-outer quadrant of right female breast: Secondary | ICD-10-CM | POA: Insufficient documentation

## 2017-05-04 DIAGNOSIS — K760 Fatty (change of) liver, not elsewhere classified: Secondary | ICD-10-CM | POA: Insufficient documentation

## 2017-05-04 LAB — GLUCOSE, CAPILLARY: Glucose-Capillary: 178 mg/dL — ABNORMAL HIGH (ref 65–99)

## 2017-05-04 MED ORDER — FLUDEOXYGLUCOSE F - 18 (FDG) INJECTION
9.6200 | Freq: Once | INTRAVENOUS | Status: AC | PRN
  Administered 2017-05-04: 9.62 via INTRAVENOUS

## 2017-05-04 NOTE — Telephone Encounter (Signed)
I left a message for the patient that the PET/CT did not show any evidence of distant metastatic disease.

## 2017-05-07 ENCOUNTER — Ambulatory Visit (HOSPITAL_COMMUNITY): Payer: BLUE CROSS/BLUE SHIELD

## 2017-05-11 ENCOUNTER — Ambulatory Visit (INDEPENDENT_AMBULATORY_CARE_PROVIDER_SITE_OTHER): Payer: BLUE CROSS/BLUE SHIELD | Admitting: Physician Assistant

## 2017-05-11 ENCOUNTER — Encounter: Payer: Self-pay | Admitting: Physician Assistant

## 2017-05-11 ENCOUNTER — Other Ambulatory Visit: Payer: Self-pay

## 2017-05-11 VITALS — BP 120/80 | HR 100 | Temp 98.1°F | Ht 66.14 in | Wt 191.4 lb

## 2017-05-11 DIAGNOSIS — J029 Acute pharyngitis, unspecified: Secondary | ICD-10-CM

## 2017-05-11 DIAGNOSIS — R0981 Nasal congestion: Secondary | ICD-10-CM | POA: Diagnosis not present

## 2017-05-11 DIAGNOSIS — J3489 Other specified disorders of nose and nasal sinuses: Secondary | ICD-10-CM

## 2017-05-11 MED ORDER — MOMETASONE FUROATE 50 MCG/ACT NA SUSP: 2.0000 | Freq: Every day | NASAL | 0 refills | Status: DC

## 2017-05-11 NOTE — Patient Instructions (Addendum)
I recommend we treat this symptomatically at this time. You can use nasal spray and oral zyrtec for your symptoms. You can use cool mist humidifier in your room at night. I also recommend Tylenol or ibuprofen over-the-counter for your sore throat. Tea recipe for sore throat: boil water, add 2 inches shaved ginger root, steep 15 minutes, add juice from 2 full lemons, and 2 tbsp honey.Please let me know if you are not seeing any improvement or get worse in 5-7 days. Allergic Rhinitis, Adult Allergic rhinitis is an allergic reaction that affects the mucous membrane inside the nose. It causes sneezing, a runny or stuffy nose, and the feeling of mucus going down the back of the throat (postnasal drip). Allergic rhinitis can be mild to severe. There are two types of allergic rhinitis:  Seasonal. This type is also called hay fever. It happens only during certain seasons.  Perennial. This type can happen at any time of the year.  What are the causes? This condition happens when the body's defense system (immune system) responds to certain harmless substances called allergens as though they were germs.  Seasonal allergic rhinitis is triggered by pollen, which can come from grasses, trees, and weeds. Perennial allergic rhinitis may be caused by:  House dust mites.  Pet dander.  Mold spores.  What are the signs or symptoms? Symptoms of this condition include:  Sneezing.  Runny or stuffy nose (nasal congestion).  Postnasal drip.  Itchy nose.  Tearing of the eyes.  Trouble sleeping.  Daytime sleepiness.  How is this diagnosed? This condition may be diagnosed based on:  Your medical history.  A physical exam.  Tests to check for related conditions, such as: ? Asthma. ? Pink eye. ? Ear infection. ? Upper respiratory infection.  Tests to find out which allergens trigger your symptoms. These may include skin or blood tests.  How is this treated? There is no cure for this  condition, but treatment can help control symptoms. Treatment may include:  Taking medicines that block allergy symptoms, such as antihistamines. Medicine may be given as a shot, nasal spray, or pill.  Avoiding the allergen.  Desensitization. This treatment involves getting ongoing shots until your body becomes less sensitive to the allergen. This treatment may be done if other treatments do not help.  If taking medicine and avoiding the allergen does not work, new, stronger medicines may be prescribed.  Follow these instructions at home:  Find out what you are allergic to. Common allergens include smoke, dust, and pollen.  Avoid the things you are allergic to. These are some things you can do to help avoid allergens: ? Replace carpet with wood, tile, or vinyl flooring. Carpet can trap dander and dust. ? Do not smoke. Do not allow smoking in your home. ? Change your heating and air conditioning filter at least once a month. ? During allergy season:  Keep windows closed as much as possible.  Plan outdoor activities when pollen counts are lowest. This is usually during the evening hours.  When coming indoors, change clothing and shower before sitting on furniture or bedding.  Take over-the-counter and prescription medicines only as told by your health care provider.  Keep all follow-up visits as told by your health care provider. This is important. Contact a health care provider if:  You have a fever.  You develop a persistent cough.  You make whistling sounds when you breathe (you wheeze).  Your symptoms interfere with your normal daily activities. Get  help right away if:  You have shortness of breath. Summary  This condition can be managed by taking medicines as directed and avoiding allergens.  Contact your health care provider if you develop a persistent cough or fever.  During allergy season, keep windows closed as much as possible. This information is not intended  to replace advice given to you by your health care provider. Make sure you discuss any questions you have with your health care provider. Document Released: 10/14/2000 Document Revised: 02/27/2016 Document Reviewed: 02/27/2016 Elsevier Interactive Patient Education  2018 [employer].    IF you received an x-ray today, you will receive an invoice from [facility]. Please contact [facility] at [phone] with questions or concerns regarding your invoice.   IF you received labwork today, you will receive an invoice from [facility]. Please contact [facility] at [phone] with questions or concerns regarding your invoice.   Our billing staff will not be able to assist you with questions regarding bills from these companies.  You will be contacted with the lab results as soon as they are available. The fastest way to get your results is to activate your My Chart account. Instructions are located on the last page of this paperwork. If you have not heard from [place] regarding the results in 2 weeks, please contact this office.

## 2017-05-11 NOTE — Progress Notes (Deleted)
   [facility]  MRN: [id number] DOB: 1970/02/17  Subjective:  Mary Dougherty is a 47 y.o. female seen in office today for a chief complaint of ***  Review of Systems  Patient Active Problem List   Diagnosis Date Noted  . Malignant neoplasm of lower-outer quadrant of right breast of female, estrogen receptor positive ([place]) 04/28/2017  . HS (hereditary spherocytosis) ([place]) 09/20/2013  . Anemia, unspecified 01/27/2013  . Syncope, vasovagal 01/17/2013    Class: Chronic  . Genital herpes 10/14/2012  . Breast cancer of lower-outer quadrant of right female breast ([place]) 09/08/2012    Current Outpatient Medications on File Prior to Visit  Medication Sig Dispense Refill  . exemestane (AROMASIN) 25 MG tablet TAKE 1 TABLET BY MOUTH EVERY DAY AFTER BREAKFAST 90 tablet 3  . metFORMIN (GLUCOPHAGE) 500 MG tablet Take 1 tablet (500 mg total) by mouth daily with breakfast. 30 tablet 0  . Blood Glucose Monitoring Suppl w/Device KIT Use to check blood glucose daily. E11.9 (Patient not taking: Reported on 05/11/2017) 1 each 0  . glucose blood (IGLUCOSE TEST STRIPS) test strip Use as instructed. E11.9. Use to check blood glucose. Dispense type covered by insurance (Patient not taking: Reported on 05/11/2017) 100 each 12  . Lancet Devices (LANCING DEVICE) MISC Use to check blood glucose once daily. E11.9. Dispense type covered by insurance. (Patient not taking: Reported on 05/11/2017) 1 each 0  . LANCETS ULTRA THIN 30G MISC Use to check blood glucose E11.9 (Patient not taking: Reported on 05/11/2017) 100 each 3  . lidocaine-prilocaine (EMLA) cream Apply 1 application topically as needed. (Patient not taking: Reported on 05/11/2017) 30 g 0  . lidocaine-prilocaine (EMLA) cream Apply to affected area once (Patient not taking: Reported on 05/11/2017) 30 g 3  . naproxen (NAPROSYN) 500 MG tablet Take 1 tablet (500 mg total) by mouth 2 (two) times daily with a meal. (Patient not taking: Reported on 05/11/2017) 30 tablet 0    No current facility-administered medications on file prior to visit.     No Known Allergies   Objective:  BP 120/80 (BP Location: Left Arm, Patient Position: Sitting, Cuff Size: Normal)   Pulse 100   Temp 98.1 F (36.7 C) (Oral)   Ht 5' 6.14" (1.68 m)   Wt 191 lb 6.4 oz (86.8 kg)   SpO2 98%   BMI 30.76 kg/m   Physical Exam  Assessment and Plan :  *** There are no diagnoses linked to this encounter.   Tenna Delaine PA-C  [facility] 05/11/2017 11:29 AM

## 2017-05-11 NOTE — Progress Notes (Signed)
MRN: [id number] DOB: 1970-04-08  Subjective:   Mary Dougherty is a 47 y.o. female presenting for chief complaint of Allergic Reaction (seasonal sinus allergy, current allergy med make her sleepy. Needs something that will not cause drowsiness. has surgery on May 1st to have port put in for chemo tx no infection can be present) .  Reports 1 day history of sinus pressure, sore itchy throat, sneezing, ear fullness. Has tried benedryl with no full relief. Denies fever, cough,  itchy watery eyes, difficulty swallowing, wheezing, shortness of breath, chest pain and myalgia, nausea, vomiting, abdominal pain and diarrhea. Has had sick contact with coworkers. Has history of seasonal allergies, but has not taken anything consistently this season. No history of asthma. Has current right breast cancer, having port placed in one week. Newly dx T2DM, on metformin.  Denies smoking. Denies any other aggravating or relieving factors, no other questions or concerns.  Mary Dougherty has a current medication list which includes the following prescription(s): exemestane, metformin, blood glucose monitoring suppl, glucose blood, lancing device, lancets ultra thin 30g, lidocaine-prilocaine, lidocaine-prilocaine, and naproxen. Also has No Known Allergies.  Mary Dougherty  has a past medical history of Anemia, Blood transfusion without reported diagnosis, Chronic anemia (03/03/13), Hereditary spherocytosis ([place]), Invasive ductal carcinoma of right breast ([place]) (09/2012), S/P radiation therapy (05/08/2013-06/22/2013), Spherocytosis ([place]) (1989), Status post chemotherapy (10/07/12 - 12/16/12), Thyroid disease, Use of exemestane (Aromasin) (07/25/13), Use of goserelin acetate (Zoladex) (07/25/13), and Wears glasses. Also  has a past surgical history that includes Wisdom tooth extraction; Portacath placement (Left, 09/20/2012); Port-a-cath removal (N/A, 04/06/2013); Breast surgery; and Breast lumpectomy with sentinel lymph node bx (Right, 04/06/2013).   Objective:   Vitals: BP 120/80 (BP Location: Left Arm, Patient Position: Sitting, Cuff Size: Normal)   Pulse 100   Temp 98.1 F (36.7 C) (Oral)   Ht 5' 6.14" (1.68 m)   Wt 191 lb 6.4 oz (86.8 kg)   SpO2 98%   BMI 30.76 kg/m   Physical Exam  Constitutional: She is oriented to person, place, and time. She appears well-developed and well-nourished. No distress.  HENT:  Head: Normocephalic and atraumatic.  Right Ear: External ear and ear canal normal. Tympanic membrane is not erythematous and not bulging. A middle ear effusion is present.  Left Ear: External ear and ear canal normal. Tympanic membrane is not erythematous and not bulging. A middle ear effusion is present.  Nose: Mucosal edema (nasal turbinates are pale and boggy bilaterally) present. Right sinus exhibits maxillary sinus tenderness (mild). Right sinus exhibits no frontal sinus tenderness. Left sinus exhibits maxillary sinus tenderness (mild). Left sinus exhibits no frontal sinus tenderness.  Mouth/Throat: Uvula is midline and mucous membranes are normal. Posterior oropharyngeal erythema (cobblestoning noted) present. No posterior oropharyngeal edema or tonsillar abscesses. Tonsils are 1+ on the right. Tonsils are 1+ on the left. No tonsillar exudate.  Eyes: Conjunctivae are normal.  Neck: Normal range of motion.  Cardiovascular: Normal rate, regular rhythm, normal heart sounds and intact distal pulses.  Pulmonary/Chest: Effort normal and breath sounds normal. She has no decreased breath sounds. She has no wheezes. She has no rhonchi. She has no rales.  Lymphadenopathy:       Head (right side): No submental, no submandibular, no tonsillar, no preauricular, no posterior auricular and no occipital adenopathy present.       Head (left side): No submental, no submandibular, no tonsillar, no preauricular, no posterior auricular and no occipital adenopathy present.    She has no cervical  adenopathy.       Right: No supraclavicular  adenopathy present.       Left: No supraclavicular adenopathy present.  Neurological: She is alert and oriented to person, place, and time.  Skin: Skin is warm and dry.  Psychiatric: She has a normal mood and affect.  Vitals reviewed.   No results found for this or any previous visit (from the past 24 hour(s)).  Assessment and Plan :  1. Sore throat 2. Sinus pressure 3. Nasal congestion -DDx includes seasonal allergies vs viral URI. Do not suspect bacterial etiology d/t reassuring vital signs and physical exam findings.  - Advised supportive care, offered symptomatic relief. - Return to clinic if symptoms worsen or fail to improve in 5-7 days, otherwise return to clinic as needed. - mometasone (NASONEX) 50 MCG/ACT nasal spray; Place 2 sprays into the nose daily.  Dispense: 17 g; Refill: 0  Tenna Delaine, PA-C  [facility] [place] 05/11/2017 11:35 AM

## 2017-05-13 ENCOUNTER — Ambulatory Visit: Payer: Self-pay

## 2017-05-13 NOTE — Telephone Encounter (Signed)
Pt. C/o dizziness with movement, changing positions. States she knows she has "fluid in her ears." Seen this week with sinus congestion - those symptoms continue. States she needs to get well - she is having a port placed next week for chemo. Request a Saturday appointment due to her work schedule. Appointment made. Reason for Disposition . [1] MILD dizziness (e.g., walking normally) AND [2] has NOT been evaluated by physician for this  (Exception: dizziness caused by heat exposure, sudden standing, or poor fluid intake)  Answer Assessment - Initial Assessment Questions 1. DESCRIPTION: "Describe your dizziness."     Lightheaded 2. LIGHTHEADED: "Do you feel lightheaded?" (e.g., somewhat faint, woozy, weak upon standing)     Changing position 3. VERTIGO: "Do you feel like either you or the room is spinning or tilting?" (i.e. vertigo)     No 4. SEVERITY: "How bad is it?"  "Do you feel like you are going to faint?" "Can you stand and walk?"   - MILD - walking normally   - MODERATE - interferes with normal activities (e.g., work, school)    - SEVERE - unable to stand, requires support to walk, feels like passing out now.      Mild 5. ONSET:  "When did the dizziness begin?"     Tuesday 6. AGGRAVATING FACTORS: "Does anything make it worse?" (e.g., standing, change in head position)     Standing 7. HEART RATE: "Can you tell me your heart rate?" "How many beats in 15 seconds?"  (Note: not all patients can do this)       No problems 8. CAUSE: "What do you think is causing the dizziness?"     Sinus 9. RECURRENT SYMPTOM: "Have you had dizziness before?" If so, ask: "When was the last time?" "What happened that time?"     Yes 10. OTHER SYMPTOMS: "Do you have any other symptoms?" (e.g., fever, chest pain, vomiting, diarrhea, bleeding)       Sinus congestion 11. PREGNANCY: "Is there any chance you are pregnant?" "When was your last menstrual period?"       No  Protocols used: DIZZINESS [facility]

## 2017-05-15 ENCOUNTER — Ambulatory Visit: Payer: Self-pay | Admitting: Physician Assistant

## 2017-05-17 ENCOUNTER — Other Ambulatory Visit: Payer: Self-pay

## 2017-05-17 ENCOUNTER — Encounter (HOSPITAL_COMMUNITY): Payer: Self-pay | Admitting: *Deleted

## 2017-05-17 NOTE — Progress Notes (Signed)
Mary Dougherty reports that CBG this am was 146, this was the lowest she has had seen she started checking it.  Patient was just diagnosed with diabetes 05/01/17.  I instructed patient to not take metformin in am.  I instructed patient to check CBG after awaking and every 2 hours until arrival  to the hospital.  I Instructed patient if CBG is less than 70 to drink 1/2 cup of a clear juice. Recheck CBG in 15 minutes then call pre- op desk at [phone].

## 2017-05-18 ENCOUNTER — Encounter (HOSPITAL_COMMUNITY)
Admission: RE | Disposition: A | Discharge status: Home / Self Care | Payer: Self-pay | Source: Ambulatory Visit | Attending: General Surgery

## 2017-05-18 ENCOUNTER — Ambulatory Visit (HOSPITAL_COMMUNITY): Payer: BLUE CROSS/BLUE SHIELD

## 2017-05-18 ENCOUNTER — Ambulatory Visit (HOSPITAL_COMMUNITY)
Admission: RE | Admit: 2017-05-18 | Discharge: 2017-05-18 | Disposition: A | Discharge status: Home / Self Care | Payer: BLUE CROSS/BLUE SHIELD | Source: Ambulatory Visit | Attending: General Surgery | Admitting: General Surgery

## 2017-05-18 ENCOUNTER — Encounter (HOSPITAL_COMMUNITY): Payer: Self-pay | Admitting: *Deleted

## 2017-05-18 ENCOUNTER — Ambulatory Visit (HOSPITAL_COMMUNITY): Payer: BLUE CROSS/BLUE SHIELD | Admitting: Certified Registered Nurse Anesthetist

## 2017-05-18 DIAGNOSIS — Z95828 Presence of other vascular implants and grafts: Secondary | ICD-10-CM

## 2017-05-18 DIAGNOSIS — Z923 Personal history of irradiation: Secondary | ICD-10-CM | POA: Insufficient documentation

## 2017-05-18 DIAGNOSIS — Z8249 Family history of ischemic heart disease and other diseases of the circulatory system: Secondary | ICD-10-CM | POA: Diagnosis not present

## 2017-05-18 DIAGNOSIS — Z79899 Other long term (current) drug therapy: Secondary | ICD-10-CM | POA: Insufficient documentation

## 2017-05-18 DIAGNOSIS — Z9221 Personal history of antineoplastic chemotherapy: Secondary | ICD-10-CM | POA: Insufficient documentation

## 2017-05-18 DIAGNOSIS — Z419 Encounter for procedure for purposes other than remedying health state, unspecified: Secondary | ICD-10-CM

## 2017-05-18 DIAGNOSIS — Z7984 Long term (current) use of oral hypoglycemic drugs: Secondary | ICD-10-CM | POA: Diagnosis not present

## 2017-05-18 DIAGNOSIS — E119 Type 2 diabetes mellitus without complications: Secondary | ICD-10-CM | POA: Diagnosis not present

## 2017-05-18 DIAGNOSIS — Z170 Estrogen receptor positive status [ER+]: Secondary | ICD-10-CM | POA: Insufficient documentation

## 2017-05-18 DIAGNOSIS — C50511 Malignant neoplasm of lower-outer quadrant of right female breast: Secondary | ICD-10-CM | POA: Diagnosis present

## 2017-05-18 HISTORY — PX: PORTACATH PLACEMENT: SHX2246

## 2017-05-18 HISTORY — DX: Syncope and collapse: R55

## 2017-05-18 HISTORY — DX: Family history of other specified conditions: Z84.89

## 2017-05-18 HISTORY — DX: Dyspnea, unspecified: R06.00

## 2017-05-18 LAB — BASIC METABOLIC PANEL
ANION GAP: 10 (ref 5–15)
BUN: 7 mg/dL (ref 6–20)
CO2: 22 mmol/L (ref 22–32)
Calcium: 8.6 mg/dL — ABNORMAL LOW (ref 8.9–10.3)
Chloride: 104 mmol/L (ref 101–111)
Creatinine, Ser: 0.6 mg/dL (ref 0.44–1.00)
GLUCOSE: 127 mg/dL — AB (ref 65–99)
POTASSIUM: 3.5 mmol/L (ref 3.5–5.1)
SODIUM: 136 mmol/L (ref 135–145)

## 2017-05-18 LAB — GLUCOSE, CAPILLARY: Glucose-Capillary: 136 mg/dL — ABNORMAL HIGH (ref 65–99)

## 2017-05-18 LAB — CBC
HEMATOCRIT: 27.3 % — AB (ref 36.0–46.0)
HEMOGLOBIN: 9.2 g/dL — AB (ref 12.0–15.0)
MCH: 28.4 pg (ref 26.0–34.0)
MCHC: 33.7 g/dL (ref 30.0–36.0)
MCV: 84.3 fL (ref 78.0–100.0)
Platelets: 239 10*3/uL (ref 150–400)
RBC: 3.24 MIL/uL — AB (ref 3.87–5.11)
RDW: 20.9 % — ABNORMAL HIGH (ref 11.5–15.5)
WBC: 11.9 10*3/uL — AB (ref 4.0–10.5)

## 2017-05-18 SURGERY — INSERTION, TUNNELED CENTRAL VENOUS DEVICE, WITH PORT
Anesthesia: General | Site: Chest | Laterality: Right

## 2017-05-18 MED ORDER — TRAMADOL HCL 50 MG PO TABS: 100.0000 mg | ORAL_TABLET | Freq: Four times a day (QID) | ORAL | 0 refills | Status: DC | PRN

## 2017-05-18 MED ORDER — ONDANSETRON HCL 4 MG/2ML IJ SOLN: 4.0000 mg | Freq: Once | INTRAMUSCULAR | Status: DC | PRN

## 2017-05-18 MED ORDER — ROCURONIUM BROMIDE 10 MG/ML (PF) SYRINGE
PREFILLED_SYRINGE | INTRAVENOUS | Status: AC
  Filled 2017-05-18: qty 5

## 2017-05-18 MED ORDER — PROPOFOL 10 MG/ML IV BOLUS
INTRAVENOUS | Status: DC | PRN
  Administered 2017-05-18: 170 mg via INTRAVENOUS

## 2017-05-18 MED ORDER — MIDAZOLAM HCL 5 MG/5ML IJ SOLN
INTRAMUSCULAR | Status: DC | PRN
  Administered 2017-05-18: 2 mg via INTRAVENOUS

## 2017-05-18 MED ORDER — LACTATED RINGERS IV SOLN
INTRAVENOUS | Status: DC
  Administered 2017-05-18: 11:00:00 via INTRAVENOUS

## 2017-05-18 MED ORDER — BUPIVACAINE HCL (PF) 0.25 % IJ SOLN
INTRAMUSCULAR | Status: AC
  Filled 2017-05-18: qty 30

## 2017-05-18 MED ORDER — HEPARIN SODIUM (PORCINE) 5000 UNIT/ML IJ SOLN
INTRAMUSCULAR | Status: DC | PRN
  Administered 2017-05-18: 500 mL

## 2017-05-18 MED ORDER — DEXAMETHASONE SODIUM PHOSPHATE 10 MG/ML IJ SOLN
INTRAMUSCULAR | Status: AC
  Filled 2017-05-18: qty 1

## 2017-05-18 MED ORDER — MIDAZOLAM HCL 2 MG/2ML IJ SOLN
INTRAMUSCULAR | Status: AC
  Filled 2017-05-18: qty 2

## 2017-05-18 MED ORDER — ACETAMINOPHEN 500 MG PO TABS
1000.0000 mg | ORAL_TABLET | ORAL | Status: AC
  Administered 2017-05-18: 1000 mg via ORAL
  Filled 2017-05-18: qty 2

## 2017-05-18 MED ORDER — ONDANSETRON HCL 4 MG/2ML IJ SOLN
INTRAMUSCULAR | Status: AC
  Filled 2017-05-18: qty 2

## 2017-05-18 MED ORDER — HEPARIN SOD (PORK) LOCK FLUSH 100 UNIT/ML IV SOLN
INTRAVENOUS | Status: AC
  Filled 2017-05-18: qty 5

## 2017-05-18 MED ORDER — OXYCODONE HCL 5 MG/5ML PO SOLN: 5.0000 mg | Freq: Once | ORAL | Status: AC | PRN

## 2017-05-18 MED ORDER — FENTANYL CITRATE (PF) 100 MCG/2ML IJ SOLN
INTRAMUSCULAR | Status: AC
  Filled 2017-05-18: qty 2

## 2017-05-18 MED ORDER — 0.9 % SODIUM CHLORIDE (POUR BTL) OPTIME
TOPICAL | Status: DC | PRN
  Administered 2017-05-18: 1000 mL

## 2017-05-18 MED ORDER — FENTANYL CITRATE (PF) 250 MCG/5ML IJ SOLN
INTRAMUSCULAR | Status: DC | PRN
  Administered 2017-05-18 (×2): 25 ug via INTRAVENOUS

## 2017-05-18 MED ORDER — ONDANSETRON HCL 4 MG/2ML IJ SOLN
INTRAMUSCULAR | Status: DC | PRN
  Administered 2017-05-18: 4 mg via INTRAVENOUS

## 2017-05-18 MED ORDER — CEFAZOLIN SODIUM-DEXTROSE 2-4 GM/100ML-% IV SOLN
2.0000 g | INTRAVENOUS | Status: AC
  Administered 2017-05-18: 2 g via INTRAVENOUS
  Filled 2017-05-18: qty 100

## 2017-05-18 MED ORDER — PROPOFOL 10 MG/ML IV BOLUS
INTRAVENOUS | Status: AC
  Filled 2017-05-18: qty 20

## 2017-05-18 MED ORDER — LIDOCAINE 2% (20 MG/ML) 5 ML SYRINGE
INTRAMUSCULAR | Status: DC | PRN
  Administered 2017-05-18: 50 mg via INTRAVENOUS

## 2017-05-18 MED ORDER — KETOROLAC TROMETHAMINE 30 MG/ML IJ SOLN
INTRAMUSCULAR | Status: AC
  Filled 2017-05-18: qty 1

## 2017-05-18 MED ORDER — OXYCODONE HCL 5 MG PO TABS
5.0000 mg | ORAL_TABLET | Freq: Once | ORAL | Status: AC | PRN
  Administered 2017-05-18: 5 mg via ORAL

## 2017-05-18 MED ORDER — DEXAMETHASONE SODIUM PHOSPHATE 10 MG/ML IJ SOLN
INTRAMUSCULAR | Status: DC | PRN
  Administered 2017-05-18: 10 mg via INTRAVENOUS

## 2017-05-18 MED ORDER — OXYCODONE HCL 5 MG PO TABS
ORAL_TABLET | ORAL | Status: AC
  Filled 2017-05-18: qty 1

## 2017-05-18 MED ORDER — HEPARIN SOD (PORK) LOCK FLUSH 100 UNIT/ML IV SOLN
INTRAVENOUS | Status: DC | PRN
  Administered 2017-05-18: 400 [IU]

## 2017-05-18 MED ORDER — FENTANYL CITRATE (PF) 250 MCG/5ML IJ SOLN
INTRAMUSCULAR | Status: AC
  Filled 2017-05-18: qty 5

## 2017-05-18 MED ORDER — EPHEDRINE 5 MG/ML INJ
INTRAVENOUS | Status: AC
  Filled 2017-05-18: qty 10

## 2017-05-18 MED ORDER — NEOSTIGMINE METHYLSULFATE 5 MG/5ML IV SOSY
PREFILLED_SYRINGE | INTRAVENOUS | Status: AC
  Filled 2017-05-18: qty 5

## 2017-05-18 MED ORDER — SODIUM CHLORIDE 0.9 % IV SOLN
INTRAVENOUS | Status: AC
  Filled 2017-05-18: qty 1.2

## 2017-05-18 MED ORDER — BUPIVACAINE HCL (PF) 0.25 % IJ SOLN
INTRAMUSCULAR | Status: DC | PRN
  Administered 2017-05-18: 8 mL

## 2017-05-18 MED ORDER — GABAPENTIN 300 MG PO CAPS
300.0000 mg | ORAL_CAPSULE | ORAL | Status: AC
  Administered 2017-05-18: 300 mg via ORAL
  Filled 2017-05-18: qty 1

## 2017-05-18 MED ORDER — DIPHENHYDRAMINE HCL 50 MG/ML IJ SOLN
INTRAMUSCULAR | Status: AC
  Filled 2017-05-18: qty 1

## 2017-05-18 MED ORDER — CELECOXIB 200 MG PO CAPS
200.0000 mg | ORAL_CAPSULE | ORAL | Status: AC
  Administered 2017-05-18: 200 mg via ORAL
  Filled 2017-05-18: qty 1

## 2017-05-18 MED ORDER — LIDOCAINE 2% (20 MG/ML) 5 ML SYRINGE
INTRAMUSCULAR | Status: AC
  Filled 2017-05-18: qty 10

## 2017-05-18 MED ORDER — FENTANYL CITRATE (PF) 100 MCG/2ML IJ SOLN
25.0000 ug | INTRAMUSCULAR | Status: DC | PRN
  Administered 2017-05-18: 50 ug via INTRAVENOUS

## 2017-05-18 SURGICAL SUPPLY — 47 items
ADH SKN CLS APL DERMABOND .7 (GAUZE/BANDAGES/DRESSINGS) ×1
BAG DECANTER FOR FLEXI CONT (MISCELLANEOUS) ×2 IMPLANT
BLADE SURG 11 STRL SS (BLADE) ×2 IMPLANT
BLADE SURG 15 STRL LF DISP TIS (BLADE) ×1 IMPLANT
BLADE SURG 15 STRL SS (BLADE) ×2
CHLORAPREP W/TINT 26ML (MISCELLANEOUS) ×2 IMPLANT
COVER SURGICAL LIGHT HANDLE (MISCELLANEOUS) ×2 IMPLANT
COVER TRANSDUCER ULTRASND GEL (DRAPE) ×2 IMPLANT
CRADLE DONUT ADULT HEAD (MISCELLANEOUS) ×2 IMPLANT
DECANTER SPIKE VIAL GLASS SM (MISCELLANEOUS) ×2 IMPLANT
DERMABOND ADVANCED (GAUZE/BANDAGES/DRESSINGS) ×1
DERMABOND ADVANCED .7 DNX12 (GAUZE/BANDAGES/DRESSINGS) ×1 IMPLANT
DRAPE C-ARM 42X72 X-RAY (DRAPES) ×2 IMPLANT
DRAPE CHEST BREAST 15X10 FENES (DRAPES) ×2 IMPLANT
DRAPE UTILITY XL STRL (DRAPES) ×2 IMPLANT
ELECT CAUTERY BLADE 6.4 (BLADE) ×2 IMPLANT
ELECT REM PT RETURN 9FT ADLT (ELECTROSURGICAL) ×2
ELECTRODE REM PT RTRN 9FT ADLT (ELECTROSURGICAL) ×1 IMPLANT
GAUZE SPONGE 4X4 16PLY XRAY LF (GAUZE/BANDAGES/DRESSINGS) ×2 IMPLANT
GEL ULTRASOUND 20GR AQUASONIC (MISCELLANEOUS) ×2 IMPLANT
GLOVE BIO SURGEON STRL SZ7 (GLOVE) ×2 IMPLANT
GLOVE BIOGEL PI IND STRL 6.5 (GLOVE) IMPLANT
GLOVE BIOGEL PI IND STRL 7.5 (GLOVE) ×1 IMPLANT
GLOVE BIOGEL PI INDICATOR 6.5 (GLOVE) ×1
GLOVE BIOGEL PI INDICATOR 7.5 (GLOVE) ×1
GLOVE ECLIPSE 6.0 STRL STRAW (GLOVE) ×1 IMPLANT
GLOVE SURG SS PI 6.5 STRL IVOR (GLOVE) ×1 IMPLANT
GOWN STRL REUS W/ TWL LRG LVL3 (GOWN DISPOSABLE) ×2 IMPLANT
GOWN STRL REUS W/TWL LRG LVL3 (GOWN DISPOSABLE) ×4
KIT BASIN OR (CUSTOM PROCEDURE TRAY) ×2 IMPLANT
KIT PORT POWER 8FR ISP CVUE (Port) ×1 IMPLANT
KIT TURNOVER KIT B (KITS) ×2 IMPLANT
NEEDLE HYPO 25GX1X1/2 BEV (NEEDLE) ×2 IMPLANT
NS IRRIG 1000ML POUR BTL (IV SOLUTION) ×2 IMPLANT
PACK SURGICAL SETUP 50X90 (CUSTOM PROCEDURE TRAY) ×2 IMPLANT
PAD ARMBOARD 7.5X6 YLW CONV (MISCELLANEOUS) ×4 IMPLANT
PENCIL BUTTON HOLSTER BLD 10FT (ELECTRODE) ×2 IMPLANT
SUT MNCRL AB 4-0 PS2 18 (SUTURE) ×2 IMPLANT
SUT PROLENE 2 0 SH DA (SUTURE) ×3 IMPLANT
SUT SILK 2 0 (SUTURE) ×2
SUT SILK 2-0 18XBRD TIE 12 (SUTURE) IMPLANT
SUT VIC AB 3-0 SH 27 (SUTURE) ×2
SUT VIC AB 3-0 SH 27XBRD (SUTURE) ×1 IMPLANT
SYR 20ML ECCENTRIC (SYRINGE) ×4 IMPLANT
SYR 5ML LUER SLIP (SYRINGE) ×2 IMPLANT
SYR CONTROL 10ML LL (SYRINGE) ×1 IMPLANT
TOWEL GREEN STERILE FF (TOWEL DISPOSABLE) ×2 IMPLANT

## 2017-05-18 NOTE — Anesthesia Procedure Notes (Signed)
Procedure Name: LMA Insertion Date/Time: 05/18/2017 12:02 PM Performed by: Valda Favia, CRNA Pre-anesthesia Checklist: Patient identified, Emergency Drugs available, Suction available and Patient being monitored Patient Re-evaluated:Patient Re-evaluated prior to induction Oxygen Delivery Method: Circle System Utilized Preoxygenation: Pre-oxygenation with 100% oxygen Induction Type: IV induction Ventilation: Mask ventilation without difficulty LMA: LMA inserted LMA Size: 4.0 Number of attempts: 1 Placement Confirmation: positive ETCO2 Tube secured with: Tape Dental Injury: Teeth and Oropharynx as per pre-operative assessment

## 2017-05-18 NOTE — Interval H&P Note (Signed)
History and Physical Interval Note:  05/18/2017 11:36 AM  Mary Dougherty  has presented today for surgery, with the diagnosis of BREAST CANCER  The various methods of treatment have been discussed with the patient and family. After consideration of risks, benefits and other options for treatment, the patient has consented to  Procedure(s): INSERTION PORT-A-CATH WITH ULTRASOUND (N/A) as a surgical intervention .  The patient's history has been reviewed, patient examined, no change in status, stable for surgery.  I have reviewed the patient's chart and labs.  Questions were answered to the patient's satisfaction.     [name]

## 2017-05-18 NOTE — Transfer of Care (Signed)
Immediate Anesthesia Transfer of Care Note  Patient: Mary Dougherty  Procedure(s) Performed: INSERTION PORT-A-CATH WITH ULTRASOUND (Right Chest)  Patient Location: PACU  Anesthesia Type:General  Level of Consciousness: awake and alert   Airway & Oxygen Therapy: Patient Spontanous Breathing and Patient connected to nasal cannula oxygen  Post-op Assessment: Report given to RN and Post -op Vital signs reviewed and stable  Post vital signs: Reviewed and stable  Last Vitals:  Vitals Value Taken Time  BP 121/67 05/18/2017  1:01 PM  Temp 36.6 C 05/18/2017 12:55 PM  Pulse 74 05/18/2017  1:03 PM  Resp 0 05/18/2017  1:03 PM  SpO2 99 % 05/18/2017  1:03 PM  Vitals shown include unvalidated device data.  Last Pain:  Vitals:   05/18/17 1300  TempSrc:   PainSc: 0-No pain      Patients Stated Pain Goal: 1 (50/93/26 7124)  Complications: No apparent anesthesia complications

## 2017-05-18 NOTE — Discharge Instructions (Signed)
    PORT-A-CATH: POST OP INSTRUCTIONS  Always review your discharge instruction sheet given to you by the facility where your surgery was performed.   1. A prescription for pain medication may be given to you upon discharge. Take your pain medication as prescribed, if needed. If narcotic pain medicine is not needed, then you make take acetaminophen (Tylenol) or ibuprofen (Advil) as needed.  2. Take your usually prescribed medications unless otherwise directed. 3. If you need a refill on your pain medication, please contact our office. All narcotic pain medicine now requires a paper prescription.  Phoned in and fax refills are no longer allowed by law.  Prescriptions will not be filled after 5 pm or on weekends.  4. You should follow a light diet for the remainder of the day after your procedure. 5. Most patients will experience some mild swelling and/or bruising in the area of the incision. It may take several days to resolve. 6. It is common to experience some constipation if taking pain medication after surgery. Increasing fluid intake and taking a stool softener (such as Colace) will usually help or prevent this problem from occurring. A mild laxative (Milk of Magnesia or Miralax) should be taken according to package directions if there are no bowel movements after 48 hours.  7. Unless discharge instructions indicate otherwise, you may remove your bandages 48 hours after surgery, and you may shower at that time. You may have steri-strips (small white skin tapes) in place directly over the incision.  These strips should be left on the skin for 7-10 days.  If your surgeon used Dermabond (skin glue) on the incision, you may shower in 24 hours.  The glue will flake off over the next 2-3 weeks.  8. If your port is left accessed at the end of surgery (needle left in port), the dressing cannot get wet and should only by changed by a healthcare professional. When the port is no longer accessed (when the  needle has been removed), follow step 7.   9. ACTIVITIES:  Limit activity involving your arms for the next 72 hours. Do no strenuous exercise or activity for 1 week. You may drive when you are no longer taking prescription pain medication, you can comfortably wear a seatbelt, and you can maneuver your car. 10.You may need to see your doctor in the office for a follow-up appointment.  Please       check with your doctor.  11.When you receive a new Port-a-Cath, you will get a product guide and        ID card.  Please keep them in case you need them.  WHEN TO CALL YOUR DOCTOR ([phone]): 1. Fever over 101.0 2. Chills 3. Continued bleeding from incision 4. Increased redness and tenderness at the site 5. Shortness of breath, difficulty breathing   The clinic staff is available to answer your questions during regular business hours. Please don't hesitate to call and ask to speak to one of the nurses or medical assistants for clinical concerns. If you have a medical emergency, go to the nearest emergency room or call 911.  A surgeon from [facility] is always on call at the hospital.     For further information, please visit [email or web address]      

## 2017-05-18 NOTE — H&P (Signed)
Mary Dougherty is an 47 y.o. female.   Chief Complaint: breast cancer HPI: 25 yof referred by Dr Mary Dougherty for recurrent right breast cancer. Mary Dougherty was diagnosed in 2014 with right breast cancer that was er pos at 49, pr pos at 30, her 2 negative and Ki was 70%. she was given dd FEC followed by weekly taxol. her tumor was 2.7 cm on mri prior to chemotherapy. she underwent lump/sn biopsy that showed a residual 1.1 cm grade III IDC with hg dcis, 1/2 sn positive. she elected to not undergo alnd at that time. she did have radiotherapy and has been on aromasin plus zoladex. she has a pten mutation that has not been shown to be concerning per genetics counselors. she had area noted 6 months ago thought to be scarring that she just underwent 6 month f/u mm. she now feels a mass. her density is a. she has a 2 cm mass in region of prior lumpectomy. [place] confirms. core biopsy was done as well. this shows a grade III IDC that is 10% er pos, pr neg, her 2 negative with Ki of 30. mri shows left breast to be negative, all nodes appear normal and has a 2.3 cm right loq mass consistent with biopsied cancer. she is here to discuss options    Past Medical History:  Diagnosis Date  . Anemia   . Blood transfusion without reported diagnosis   . Chronic anemia 03/03/13  . Diabetes mellitus without complication (Mary Dougherty)    diabetes II  . Dyspnea    with exertion  . Family history of adverse reaction to anesthesia     father- hard to awaken  . Hereditary spherocytosis ([place])   . Invasive ductal carcinoma of right breast ([place]) 09/2012   ER(99%), PR(100%), Ki-67(70%), 1/5 nodes positive  . S/P radiation therapy 05/08/2013-06/22/2013   1) Right breast / 50.4 Gy in 28 fractions / 2) Right Supraclavicular fossa/ 50.4 Gy in 28 fractions /3) Right Posterior Axillary boost / 11.9 Gy in 28 fractions /4) Right breast boost / 10 Gy in 5 fractions   . Spherocytosis ([place]) 1989  . Status post chemotherapy 10/07/12 - 12/16/12     Neoadjuvant chemotherapy with dose dense FEC (5-FU/epirubicin/Cytoxan) from 10/07/2012 through 12/16/12.    Marland Kitchen Syncope and collapse 1989   "thats when I found out I have Spherocytosis"  . Thyroid disease   . Use of exemestane (Aromasin) 07/25/13  . Use of goserelin acetate (Zoladex) 07/25/13  . Wears glasses     Past Surgical History:  Procedure Laterality Date  . BREAST LUMPECTOMY WITH SENTINEL LYMPH NODE BIOPSY Right 04/06/2013   Mary Dougherty  . BREAST SURGERY     left breast lumpectomy/left ax snbx  . COLONOSCOPY  2015  . PORT-A-CATH REMOVAL N/A 04/06/2013   Procedure: REMOVAL PORT-A-CATH;  Surgeon: Mary Bookbinder, MD;  Location: [facility];  Service: General;  Laterality: N/A;  . PORTACATH PLACEMENT Left 09/20/2012   Procedure: INSERTION PORT-A-CATH;  Surgeon: Mary Bookbinder, MD;  Location: [facility];  Service: General;  Laterality: Left;  . WISDOM TOOTH EXTRACTION      Family History  Problem Relation Age of Onset  . Pulmonary embolism Maternal Uncle 67       died instantly  . Melanoma Cousin        paternal cousin dx in her 9s  . Heart disease Father   . Diabetes Father   . Thyroid disease Mother    Social History:  reports that she has never smoked. She has never used smokeless tobacco. She reports that she does not drink alcohol or use drugs.  Allergies: No Known Allergies  Medications Prior to Admission  Medication Sig Dispense Refill  . exemestane (AROMASIN) 25 MG tablet TAKE 1 TABLET BY MOUTH EVERY DAY AFTER BREAKFAST 90 tablet 3  . metFORMIN (GLUCOPHAGE) 500 MG tablet Take 1 tablet (500 mg total) by mouth daily with breakfast. 30 tablet 0  . mometasone (NASONEX) 50 MCG/ACT nasal spray Place 2 sprays into the nose daily. 17 g 0  . Blood Glucose Monitoring Suppl w/Device KIT Use to check blood glucose daily. E11.9 (Patient not taking: Reported on 05/11/2017) 1 each 0  . glucose blood (IGLUCOSE TEST STRIPS) test strip Use as  instructed. E11.9. Use to check blood glucose. Dispense type covered by insurance (Patient not taking: Reported on 05/11/2017) 100 each 12  . Lancet Devices (LANCING DEVICE) MISC Use to check blood glucose once daily. E11.9. Dispense type covered by insurance. (Patient not taking: Reported on 05/11/2017) 1 each 0  . LANCETS ULTRA THIN 30G MISC Use to check blood glucose E11.9 (Patient not taking: Reported on 05/11/2017) 100 each 3  . lidocaine-prilocaine (EMLA) cream Apply 1 application topically as needed. (Patient not taking: Reported on 05/11/2017) 30 g 0  . lidocaine-prilocaine (EMLA) cream Apply to affected area once (Patient not taking: Reported on 05/11/2017) 30 g 3  . naproxen (NAPROSYN) 500 MG tablet Take 1 tablet (500 mg total) by mouth 2 (two) times daily with a meal. (Patient not taking: Reported on 05/11/2017) 30 tablet 0    Results for orders placed or performed during the hospital encounter of 05/18/17 (from the past 48 hour(s))  Glucose, capillary     Status: Abnormal   Collection Time: 05/18/17 10:16 AM  Result Value Ref Range   Glucose-Capillary 136 (H) 65 - 99 mg/dL   No results found.  Review of Systems  All other systems reviewed and are negative.   Blood pressure (!) 120/58, pulse 67, temperature 97.9 F (36.6 C), temperature source Oral, resp. rate 18, height _0  (1.651 m), weight 87.5 kg (193 lb), SpO2 97 %. Physical Exam  Vitals Mary Dougherty; 04/23/2017 11:12 AM) 04/23/2017 11:11 AM Weight: 193.25 lb Height: 64.5in Body Surface Area: 1.94 m Body Mass Index: 32.66 kg/m  Temp.: 99.70F(Oral)  Pulse: 99 (Regular)  BP: 110/70 (Sitting, Left Arm, Standard) Physical Exam Mary Bookbinder MD; 04/23/2017 1:25 PM) General Mental Status-Alert. Orientation-Oriented X3. Head and Neck Trachea-midline. Thyroid Gland Characteristics - normal size and consistency. Eye Sclera/Conjunctiva - Bilateral-No scleral icterus. Chest and Lung Exam Chest and lung  exam reveals -quiet, even and easy respiratory effort with no use of accessory muscles and on auscultation, normal breath sounds, no adventitious sounds and normal vocal resonance. Breast Nipples-No Discharge. Note: some telengectasias around right nac, has about 2 cm mass with scarring around old lumpectomy scar Cardiovascular Cardiovascular examination reveals -normal heart sounds, regular rate and rhythm with no murmurs. Abdomen Note: soft nontender no hepatomegaly Lymphatic Head & Neck General Head & Neck Lymphatics: Bilateral - Description - Normal. Axillary General Axillary Region: Bilateral - Description - Normal. Note: no B[place]nell adenopathy  Assessment & Plan Mary Bookbinder MD; 04/23/2017 1:33 PM) BREAST CANCER OF LOWER-OUTER QUADRANT OF RIGHT FEMALE BREAST (C50.511)  Right mastectomy, attempt right axillary sn biopsy after primary chemotherapy Port placement today to begin chemothearpy we discussed recurrence of breast cancer. this is not one the AI would prevent. it is  morphologically different. I recommended mastectomy since she has failed standard bct at this point and cannot get further radiation therapy. we discussed options for mastectomy including nsm with reconstruction, ssm with reconstruction or mastectomy without reconstruction. she understands with prior radiation likely would need flap based reconstruction. I am going to have her see Dr Iran Planas at some point to discuss reconstruction. I will also attempt a sn biopsy again as well. I do think she will get chemotherapy for this tumor given close to tn status and size. If we are going to do chemo I think starting with that is reasonable given time course to surgery especially with reconstruction.  Mary Bookbinder, MD 05/18/2017, 11:34 AM

## 2017-05-18 NOTE — Op Note (Signed)
Preoperative diagnosis:breast cancer, recurrent  Postoperative diagnosis: same as above Procedure: right ij US guided powerport insertion Surgeon: Dr Serita Grammes EBL: minimal Anes: general  Specimens none Complications none Drains none Sponge count correct Dispo to pacu stable  Indications: This is a52 yof with recurrent right breast cancer. She is indicated for chemotherapy.We discussed port placement.  Procedure: After informed consent was obtained the patient was taken to the operating room. She was given antibiotics. Sequential compression devices were on her legs. She was then placed under general anesthesia with an LMA. Then she was prepped and draped in the standard sterile surgical fashion. Surgical timeout was then performed.  I used the ultrasound to identify the right internal jugular vein.  I then accessed the vein using the ultrasound.This aspirated blood. I then placed the wire. The wire was confirmed by fluoroscopy and ultrasound to be in the correct position.I tunneled the line between the 2 sites.I then dilated the tract and placed the dilator assembly with the sheath. This was done under fluoroscopy. I then removed the sheath and dilator. The wire was also removed. The line was then pulled back to be in the venacava. I hooked this up to the port. I sutured this into place with 2-0 Prolene in 2 places. This aspirated blood and flushed easily.This was confirmed with a final fluoroscopy. I then closed this with 2-0 Vicryl and 4-0 Monocryl. This withdrew blood and I placed heparin in it. Dermabond was placed on both the incisions.A dressing was placed. She tolerated this well and was transferred to the recovery room in stable condition

## 2017-05-18 NOTE — Anesthesia Preprocedure Evaluation (Addendum)
Anesthesia Evaluation  Patient identified by MRN, date of birth, ID band Patient awake    Reviewed: Allergy & Precautions, NPO status , Patient's Chart, lab work & pertinent test results  Airway Mallampati: II  TM Distance: >3 FB Neck ROM: Full    Dental  (+) Teeth Intact, Dental Advisory Given   Pulmonary    breath sounds clear to auscultation       Cardiovascular  Rhythm:Regular Rate:Normal     Neuro/Psych    GI/Hepatic   Endo/Other  diabetes  Renal/GU      Musculoskeletal   Abdominal   Peds  Hematology   Anesthesia Other Findings   Reproductive/Obstetrics                             Anesthesia Physical Anesthesia Plan  ASA: II  Anesthesia Plan: General   Post-op Pain Management:    Induction: Intravenous  PONV Risk Score and Plan: Ondansetron  Airway Management Planned: LMA  Additional Equipment:   Intra-op Plan:   Post-operative Plan:   Informed Consent: I have reviewed the patients History and Physical, chart, labs and discussed the procedure including the risks, benefits and alternatives for the proposed anesthesia with the patient or authorized representative who has indicated his/her understanding and acceptance.   Dental advisory given  Plan Discussed with: CRNA and Anesthesiologist  Anesthesia Plan Comments:         Anesthesia Quick Evaluation

## 2017-05-18 NOTE — Anesthesia Postprocedure Evaluation (Signed)
Anesthesia Post Note  Patient: Mary Dougherty  Procedure(s) Performed: INSERTION PORT-A-CATH WITH ULTRASOUND (Right Chest)     Patient location during evaluation: PACU Anesthesia Type: General Level of consciousness: awake and alert Pain management: pain level controlled Vital Signs Assessment: post-procedure vital signs reviewed and stable Respiratory status: spontaneous breathing, nonlabored ventilation, respiratory function stable and patient connected to nasal cannula oxygen Cardiovascular status: blood pressure returned to baseline and stable Postop Assessment: no apparent nausea or vomiting Anesthetic complications: no    Last Vitals:  Vitals:   05/18/17 1345 05/18/17 1400  BP: 107/68 105/66  Pulse: 75 72  Resp: 14 12  Temp:  36.7 C  SpO2: 94% 95%    Last Pain:  Vitals:   05/18/17 1400  TempSrc:   PainSc: 2                  Maudell Stanbrough COKER

## 2017-05-19 ENCOUNTER — Encounter (HOSPITAL_COMMUNITY): Payer: Self-pay | Admitting: General Surgery

## 2017-05-19 LAB — GLUCOSE, CAPILLARY: GLUCOSE-CAPILLARY: 121 mg/dL — AB (ref 65–99)

## 2017-05-23 ENCOUNTER — Other Ambulatory Visit: Payer: Self-pay | Admitting: Family Medicine

## 2017-05-23 DIAGNOSIS — E119 Type 2 diabetes mellitus without complications: Secondary | ICD-10-CM

## 2017-05-24 NOTE — Telephone Encounter (Signed)
Metformin refill Last OV: 05/01/17 Last Refill:05/01/17 #30 tabs no refill Pharmacy:[facility] [address] Last Hbg A1C 7.9  PCP: Debroah Baller

## 2017-05-28 ENCOUNTER — Encounter: Payer: Self-pay | Admitting: Pharmacist

## 2017-05-29 ENCOUNTER — Ambulatory Visit: Payer: BLUE CROSS/BLUE SHIELD | Admitting: Family Medicine

## 2017-05-29 ENCOUNTER — Other Ambulatory Visit: Payer: Self-pay

## 2017-05-29 ENCOUNTER — Encounter: Payer: Self-pay | Admitting: Family Medicine

## 2017-05-29 VITALS — BP 118/66 | HR 84 | Temp 98.3°F | Resp 16 | Ht 65.0 in | Wt 188.0 lb

## 2017-05-29 DIAGNOSIS — Z6831 Body mass index (BMI) 31.0-31.9, adult: Secondary | ICD-10-CM | POA: Diagnosis not present

## 2017-05-29 DIAGNOSIS — E6609 Other obesity due to excess calories: Secondary | ICD-10-CM | POA: Diagnosis not present

## 2017-05-29 DIAGNOSIS — E119 Type 2 diabetes mellitus without complications: Secondary | ICD-10-CM | POA: Diagnosis not present

## 2017-05-29 NOTE — Patient Instructions (Addendum)
Low Glycemic Foods (20-49)  Breakfast Cereals: All-Bran                All-Bran Fruit ' n Oats Fiber One               Oatmeal (not instant)  Oat bran Fruits and fruit juices: (Limit to 1-2 servings per day) Apples               Apricots (fresh & dried)  Blackberries            Blueberries Cherries                  Cranberries             Peaches                  Pears                       Plums                       Prunes Raspberries            Strawberries           Tangerine Apple juice              Tomato juice  Beans and legumes (fresh-cooked): Black-eyed peas     Butter beans Chick peas              Lentils     Green beans           Lima beans               Kidney beans          Navy beans  Non-starchy vegetables: Asparagus, bok choy, broccoli, cabbage, cauliflower, celery, cucumber, greens, lettuce, mushrooms, peppers, tomatoes, okra, onions, snow peas, spinach, summer squash  Grains: Barley                                Bulgur Rye                                    Wild rice  Nuts and oils : Almonds         Peanuts     Sunflower seeds  Flax seeds Hazelnuts      Pecans          Walnuts Oils that are liquid at room temperature  Dairy, fish, and meat: Milk, skim                         Lowfat cheese Yogurt, lowfat, fruit sugar sweetened Lean red meat                      Fish  Skinless chicken & [place]    Shellfish Moderate Glycemic Foods (50-69)  Breakfast Cereals: Bran Buds                             Bran Chex Just Right                            Mini-Wheats Special K         Swiss  muesli  Fruits: Banana (under-ripe)             Dates Figs                                      Grapes Kiwi                                      Mango Oranges                               Raisins  Fruit Juices: Cranberry juice                    Orange juice  Beans and legumes: [place]-type baked beans Canned pinto, kidney, or navy beans Green  peas  Vegetables: Beets                          Raw Carrots  Sweet potato               Yam Corn on the cob  Breads: Pita (pocket) bread          Oat bran bread Pumpernickel bread           Rye bread Wheat bread, high fiber       Grains: Cornmeal                           Rice, brown   Rice, white                         Couscous Pasta: Macaroni                           Pizza  cheese Raviolimeat filled           Spaghetti, white        Nuts: Cashews                           Macadamia  Snacks: Chocolate                     Ice cream, lowfat   Muffin                               Popcorn High Glycemic Foods (70-100)   Breakfast Cereals: Cheerios                 Corn Chex Corn Flakes            Cream of Wheat Grape Nuts              Grape Nut Flakes Life                 Nutri-Grain       Puffed Rice               Puffed Wheat Rice Chex                 Rice Krispies Shredded Wheat  Team Total Fruits: Pineapple                 Watermelon Banana (over-ripe)  Beverages: Sodas, sweet tea, pineapple juice  Vegetables: Potato, baked, boiled, fried, mashed [place] fries Canned or frozen corn Cooked carrots Parsnips Winter squash  Breads: Most breads (white and whole grain) Bagels                     Bread sticks Bread stuffing          Kaiser roll Dinner rolls  Grains: Rice, instant          Tapioca, with milk  Candy and most cookies Snacks: Donuts                      Corn chips        Jelly beans                 Pretzels Pastries                             Restaurant and ethnic foods Most [place] food (sugar in stir fry or wok sauces) Teriyaki-style meats and vegetables       IF you received an x-ray today, you will receive an invoice from [facility]. Please contact [facility] at [phone] with questions or concerns regarding your invoice.   IF you received labwork today, you will receive an invoice  from [facility]. Please contact [facility] at [phone] with questions or concerns regarding your invoice.   Our billing staff will not be able to assist you with questions regarding bills from these companies.  You will be contacted with the lab results as soon as they are available. The fastest way to get your results is to activate your My Chart account. Instructions are located on the last page of this paperwork. If you have not heard from [place] regarding the results in 2 weeks, please contact this office.

## 2017-05-29 NOTE — Progress Notes (Signed)
Chief Complaint  Patient presents with  . Diabetes    diabetic education and testing supplies     HPI  New Onset Diabetes Patient reports that she is getting home blood glucose readings of 150-180s She states that when her sugars drop to less than 120 "she feels sick as a dog" She reports that she checks her sugars daily She states that she has been eating more peanut  She has been eating her peanut butter with a banana Lab Results  Component Value Date   HGBA1C 7.9 05/01/2017   Obesity Body mass index is 31.28 kg/m.   She reports that with the dietary changes and avoiding sodas she is also losing weight Wt Readings from Last 3 Encounters:  05/29/17 188 lb (85.3 kg)  05/18/17 193 lb (87.5 kg)  05/11/17 191 lb 6.4 oz (86.8 kg)   She states that she is down a size in clothes She reports that she is not exercising yet but plans to start The food choices and portions is really what she is focusing on  Past Medical History:  Diagnosis Date  . Anemia   . Blood transfusion without reported diagnosis   . Chronic anemia 03/03/13  . Diabetes mellitus without complication ([place])    diabetes II  . Dyspnea    with exertion  . Family history of adverse reaction to anesthesia     father- hard to awaken  . Hereditary spherocytosis ([place])   . Invasive ductal carcinoma of right breast ([place]) 09/2012   ER(99%), PR(100%), Ki-67(70%), 1/5 nodes positive  . S/P radiation therapy 05/08/2013-06/22/2013   1) Right breast / 50.4 Gy in 28 fractions / 2) Right Supraclavicular fossa/ 50.4 Gy in 28 fractions /3) Right Posterior Axillary boost / 11.9 Gy in 28 fractions /4) Right breast boost / 10 Gy in 5 fractions   . Spherocytosis ([place]) 1989  . Status post chemotherapy 10/07/12 - 12/16/12    Neoadjuvant chemotherapy with dose dense FEC (5-FU/epirubicin/Cytoxan) from 10/07/2012 through 12/16/12.    Marland Kitchen Syncope and collapse 1989   "thats when I found out I have Spherocytosis"  . Thyroid disease   . Use of  exemestane (Aromasin) 07/25/13  . Use of goserelin acetate (Zoladex) 07/25/13  . Wears glasses     Current Outpatient Medications  Medication Sig Dispense Refill  . exemestane (AROMASIN) 25 MG tablet TAKE 1 TABLET BY MOUTH EVERY DAY AFTER BREAKFAST (Patient taking differently: Take 25 mg by mouth daily after breakfast. ) 90 tablet 3  . metFORMIN (GLUCOPHAGE) 500 MG tablet TAKE 1 TABLET BY MOUTH EVERY DAY WITH BREAKFAST 30 tablet 0  . mometasone (NASONEX) 50 MCG/ACT nasal spray Place 2 sprays into the nose daily. 17 g 0  . traMADol (ULTRAM) 50 MG tablet Take 2 tablets (100 mg total) by mouth every 6 (six) hours as needed. 10 tablet 0   No current facility-administered medications for this visit.     Allergies: No Known Allergies  Past Surgical History:  Procedure Laterality Date  . BREAST LUMPECTOMY WITH SENTINEL LYMPH NODE BIOPSY Right 04/06/2013   [name]  . BREAST SURGERY     left breast lumpectomy/left ax snbx  . COLONOSCOPY  2015  . PORT-A-CATH REMOVAL N/A 04/06/2013   Procedure: REMOVAL PORT-A-CATH;  Surgeon: [name], MD;  Location: [facility];  Service: General;  Laterality: N/A;  . PORTACATH PLACEMENT Left 09/20/2012   Procedure: INSERTION PORT-A-CATH;  Surgeon: [name], MD;  Location: [facility];  Service:  General;  Laterality: Left;  . PORTACATH PLACEMENT Right 05/18/2017   Procedure: INSERTION PORT-A-CATH WITH ULTRASOUND;  Surgeon: [name], MD;  Location: [facility];  Service: General;  Laterality: Right;  . WISDOM TOOTH EXTRACTION      Social History   Socioeconomic History  . Marital status: Single    Spouse name: Not on file  . Number of children: Not on file  . Years of education: Not on file  . Highest education level: Not on file  Occupational History  . Not on file  Social Needs  . Financial resource strain: Not on file  . Food insecurity:    Worry: Not on file    Inability: Not on file  .  Transportation needs:    Medical: Not on file    Non-medical: Not on file  Tobacco Use  . Smoking status: Never Smoker  . Smokeless tobacco: Never Used  Substance and Sexual Activity  . Alcohol use: No  . Drug use: No  . Sexual activity: Yes    Birth control/protection: Abstinence    Comment: menarche age 52, P63,  last menstrual cycle 08/26/2012  Lifestyle  . Physical activity:    Days per week: Not on file    Minutes per session: Not on file  . Stress: Not on file  Relationships  . Social connections:    Talks on phone: Not on file    Gets together: Not on file    Attends religious service: Not on file    Active member of club or organization: Not on file    Attends meetings of clubs or organizations: Not on file    Relationship status: Not on file  Other Topics Concern  . Not on file  Social History Narrative  . Not on file    Family History  Problem Relation Age of Onset  . Pulmonary embolism Maternal Uncle 31       died instantly  . Melanoma Cousin        paternal cousin dx in her 65s  . Heart disease Father   . Diabetes Father   . Thyroid disease Mother      ROS Review of Systems See HPI Constitution: No fevers or chills No malaise No diaphoresis Skin: No rash or itching Eyes: no blurry vision, no double vision GU: no dysuria or hematuria Neuro: no dizziness or headaches  all others reviewed and negative   Objective: Vitals:   05/29/17 0828  BP: 118/66  Pulse: 84  Resp: 16  Temp: 98.3 F (36.8 C)  SpO2: 98%  Weight: 188 lb (85.3 kg)  Height: _0  (1.651 m)  Body mass index is 31.28 kg/m.  Wt Readings from Last 3 Encounters:  05/29/17 188 lb (85.3 kg)  05/18/17 193 lb (87.5 kg)  05/11/17 191 lb 6.4 oz (86.8 kg)     Physical Exam Physical Exam  Constitutional: She is oriented to person, place, and time. She appears well-developed and well-nourished.  HENT:  Head: Normocephalic and atraumatic.  Eyes: Conjunctivae and EOM are normal.    Cardiovascular: Normal rate, regular rhythm and normal heart sounds.   Pulmonary/Chest: Effort normal and breath sounds normal. No respiratory distress. She has no wheezes.  Neurological: She is alert and oriented to person, place, and time.    Assessment and Plan Armenta was seen today for diabetes.  Diagnoses and all orders for this visit:  New onset type 2 diabetes mellitus ([place]) discussed home blood glucose monitoring  Discussed glycemic index  and foods  Discussed metformin and goals for a1c  Class 1 obesity due to excess calories with serious comorbidity and body mass index (BMI) of 31.0 to 31.9 in adult -  Discussed her current weight loss strategies -  Already down 5 pounds  A total of 25 minutes were spent face-to-face with the patient during this encounter and over half of that time was spent on counseling and coordination of care. Reviewed carb counting books and websites Referral already placed for diabetic education Also reviewed [email or web address] with patient   Skylen Danielsen A Nolon Rod

## 2017-06-02 ENCOUNTER — Inpatient Hospital Stay: Payer: BLUE CROSS/BLUE SHIELD

## 2017-06-02 ENCOUNTER — Inpatient Hospital Stay: Payer: BLUE CROSS/BLUE SHIELD | Attending: Hematology and Oncology

## 2017-06-02 ENCOUNTER — Inpatient Hospital Stay (HOSPITAL_BASED_OUTPATIENT_CLINIC_OR_DEPARTMENT_OTHER): Payer: BLUE CROSS/BLUE SHIELD | Admitting: Hematology and Oncology

## 2017-06-02 DIAGNOSIS — E119 Type 2 diabetes mellitus without complications: Secondary | ICD-10-CM | POA: Diagnosis not present

## 2017-06-02 DIAGNOSIS — D638 Anemia in other chronic diseases classified elsewhere: Secondary | ICD-10-CM | POA: Insufficient documentation

## 2017-06-02 DIAGNOSIS — Z170 Estrogen receptor positive status [ER+]: Secondary | ICD-10-CM | POA: Diagnosis not present

## 2017-06-02 DIAGNOSIS — D649 Anemia, unspecified: Secondary | ICD-10-CM | POA: Diagnosis not present

## 2017-06-02 DIAGNOSIS — R945 Abnormal results of liver function studies: Secondary | ICD-10-CM | POA: Insufficient documentation

## 2017-06-02 DIAGNOSIS — K5900 Constipation, unspecified: Secondary | ICD-10-CM | POA: Diagnosis not present

## 2017-06-02 DIAGNOSIS — Z95828 Presence of other vascular implants and grafts: Secondary | ICD-10-CM

## 2017-06-02 DIAGNOSIS — C50511 Malignant neoplasm of lower-outer quadrant of right female breast: Secondary | ICD-10-CM | POA: Insufficient documentation

## 2017-06-02 DIAGNOSIS — D580 Hereditary spherocytosis: Secondary | ICD-10-CM | POA: Diagnosis not present

## 2017-06-02 DIAGNOSIS — Z5111 Encounter for antineoplastic chemotherapy: Secondary | ICD-10-CM | POA: Diagnosis present

## 2017-06-02 LAB — CBC WITH DIFFERENTIAL (CANCER CENTER ONLY)
Basophils Absolute: 0.1 10*3/uL (ref 0.0–0.1)
Basophils Relative: 1 %
EOS PCT: 3 %
Eosinophils Absolute: 0.3 10*3/uL (ref 0.0–0.5)
HCT: 27.8 % — ABNORMAL LOW (ref 34.8–46.6)
HEMOGLOBIN: 9.7 g/dL — AB (ref 11.6–15.9)
LYMPHS ABS: 1.8 10*3/uL (ref 0.9–3.3)
Lymphocytes Relative: 19 %
MCH: 29.2 pg (ref 25.1–34.0)
MCHC: 35 g/dL (ref 31.5–36.0)
MCV: 83.4 fL (ref 79.5–101.0)
MONOS PCT: 4 %
Monocytes Absolute: 0.4 10*3/uL (ref 0.1–0.9)
NEUTROS PCT: 73 %
Neutro Abs: 7 10*3/uL — ABNORMAL HIGH (ref 1.5–6.5)
Platelet Count: 192 10*3/uL (ref 145–400)
RBC: 3.34 MIL/uL — ABNORMAL LOW (ref 3.70–5.45)
RDW: 21.6 % — ABNORMAL HIGH (ref 11.2–14.5)
WBC Count: 9.5 10*3/uL (ref 3.9–10.3)

## 2017-06-02 LAB — CMP (CANCER CENTER ONLY)
ALT: 19 U/L (ref 0–55)
AST: 17 U/L (ref 5–34)
Albumin: 3.8 g/dL (ref 3.5–5.0)
Alkaline Phosphatase: 96 U/L (ref 40–150)
Anion gap: 7 (ref 3–11)
BUN: 10 mg/dL (ref 7–26)
CHLORIDE: 107 mmol/L (ref 98–109)
CO2: 24 mmol/L (ref 22–29)
Calcium: 8.8 mg/dL (ref 8.4–10.4)
Creatinine: 0.7 mg/dL (ref 0.60–1.10)
GFR, Est AFR Am: >60 mL/min (ref >60)
GFR, Estimated: >60 mL/min (ref >60)
GLUCOSE: 214 mg/dL — AB (ref 70–140)
Potassium: 3.9 mmol/L (ref 3.5–5.1)
SODIUM: 138 mmol/L (ref 136–145)
Total Bilirubin: 1.2 mg/dL (ref 0.2–1.2)
Total Protein: 7 g/dL (ref 6.4–8.3)

## 2017-06-02 MED ORDER — PALONOSETRON HCL INJECTION 0.25 MG/5ML
INTRAVENOUS | Status: AC
  Filled 2017-06-02: qty 5

## 2017-06-02 MED ORDER — CETIRIZINE HCL 5 MG/5ML PO SOLN: 5.0000 mg | Freq: Every day | ORAL | Status: DC

## 2017-06-02 MED ORDER — PALONOSETRON HCL INJECTION 0.25 MG/5ML
0.2500 mg | Freq: Once | INTRAVENOUS | Status: AC
  Administered 2017-06-02: 0.25 mg via INTRAVENOUS

## 2017-06-02 MED ORDER — DEXAMETHASONE SODIUM PHOSPHATE 10 MG/ML IJ SOLN
INTRAMUSCULAR | Status: AC
  Filled 2017-06-02: qty 1

## 2017-06-02 MED ORDER — SODIUM CHLORIDE 0.9 % IV SOLN
289.6000 mg | Freq: Once | INTRAVENOUS | Status: AC
  Administered 2017-06-02: 290 mg via INTRAVENOUS
  Filled 2017-06-02: qty 29

## 2017-06-02 MED ORDER — SODIUM CHLORIDE 0.9 % IV SOLN
Freq: Once | INTRAVENOUS | Status: AC
  Administered 2017-06-02: 09:00:00 via INTRAVENOUS

## 2017-06-02 MED ORDER — HEPARIN SOD (PORK) LOCK FLUSH 100 UNIT/ML IV SOLN
500.0000 [IU] | Freq: Once | INTRAVENOUS | Status: AC | PRN
  Administered 2017-06-02: 500 [IU]
  Filled 2017-06-02: qty 5

## 2017-06-02 MED ORDER — PROCHLORPERAZINE MALEATE 10 MG PO TABS: 10.0000 mg | ORAL_TABLET | Freq: Four times a day (QID) | ORAL | 0 refills | Status: DC | PRN

## 2017-06-02 MED ORDER — SODIUM CHLORIDE 0.9% FLUSH
10.0000 mL | INTRAVENOUS | Status: DC | PRN
  Administered 2017-06-02: 10 mL via INTRAVENOUS
  Filled 2017-06-02: qty 10

## 2017-06-02 MED ORDER — SODIUM CHLORIDE 0.9% FLUSH
10.0000 mL | INTRAVENOUS | Status: DC | PRN
  Administered 2017-06-02: 10 mL
  Filled 2017-06-02: qty 10

## 2017-06-02 MED ORDER — ONDANSETRON HCL 8 MG PO TABS: 8.0000 mg | ORAL_TABLET | Freq: Three times a day (TID) | ORAL | 3 refills | Status: DC | PRN

## 2017-06-02 MED ORDER — SODIUM CHLORIDE 0.9 % IV SOLN
1000.0000 mg/m2 | Freq: Once | INTRAVENOUS | Status: AC
  Administered 2017-06-02: 2014 mg via INTRAVENOUS
  Filled 2017-06-02: qty 52.97

## 2017-06-02 NOTE — Patient Instructions (Addendum)
[facility] Discharge Instructions for Patients Receiving Chemotherapy  Today you received the following chemotherapy agents Gemzar,carboplatin,  To help prevent nausea and vomiting after your treatment, we encourage you to take your nausea medication as directed   If you develop nausea and vomiting that is not controlled by your nausea medication, call the clinic.   BELOW ARE SYMPTOMS THAT SHOULD BE REPORTED IMMEDIATELY:  *FEVER GREATER THAN 100.5 F  *CHILLS WITH OR WITHOUT FEVER  NAUSEA AND VOMITING THAT IS NOT CONTROLLED WITH YOUR NAUSEA MEDICATION  *UNUSUAL SHORTNESS OF BREATH  *UNUSUAL BRUISING OR BLEEDING  TENDERNESS IN MOUTH AND THROAT WITH OR WITHOUT PRESENCE OF ULCERS  *URINARY PROBLEMS  *BOWEL PROBLEMS  UNUSUAL RASH Items with * indicate a potential emergency and should be followed up as soon as possible.  Feel free to call the clinic should you have any questions or concerns. The clinic phone number is [phone].  Please show the [place] at check-in to the Emergency Department and triage nurse.    Gemcitabine injection What is this medicine? GEMCITABINE (jem SIT a been) is a chemotherapy drug. This medicine is used to treat many types of cancer like breast cancer, lung cancer, pancreatic cancer, and ovarian cancer. This medicine may be used for other purposes; ask your health care provider or pharmacist if you have questions. COMMON BRAND NAME(S): Gemzar What should I tell my health care provider before I take this medicine? They need to know if you have any of these conditions: -blood disorders -infection -kidney disease -liver disease -recent or ongoing radiation therapy -an unusual or allergic reaction to gemcitabine, other chemotherapy, other medicines, foods, dyes, or preservatives -pregnant or trying to get pregnant -breast-feeding How should I use this medicine? This drug is given as an infusion into a vein. It is  administered in a hospital or clinic by a specially trained health care professional. Talk to your pediatrician regarding the use of this medicine in children. Special care may be needed. Overdosage: If you think you have taken too much of this medicine contact a poison control center or emergency room at once. NOTE: This medicine is only for you. Do not share this medicine with others. What if I miss a dose? It is important not to miss your dose. Call your doctor or health care professional if you are unable to keep an appointment. What may interact with this medicine? -medicines to increase blood counts like filgrastim, pegfilgrastim, sargramostim -some other chemotherapy drugs like cisplatin -vaccines Talk to your doctor or health care professional before taking any of these medicines: -acetaminophen -aspirin -ibuprofen -ketoprofen -naproxen This list may not describe all possible interactions. Give your health care provider a list of all the medicines, herbs, non-prescription drugs, or dietary supplements you use. Also tell them if you smoke, drink alcohol, or use illegal drugs. Some items may interact with your medicine. What should I watch for while using this medicine? Visit your doctor for checks on your progress. This drug may make you feel generally unwell. This is not uncommon, as chemotherapy can affect healthy cells as well as cancer cells. Report any side effects. Continue your course of treatment even though you feel ill unless your doctor tells you to stop. In some cases, you may be given additional medicines to help with side effects. Follow all directions for their use. Call your doctor or health care professional for advice if you get a fever, chills or sore throat, or other symptoms of a  cold or flu. Do not treat yourself. This drug decreases your body's ability to fight infections. Try to avoid being around people who are sick. This medicine may increase your risk to bruise  or bleed. Call your doctor or health care professional if you notice any unusual bleeding. Be careful brushing and flossing your teeth or using a toothpick because you may get an infection or bleed more easily. If you have any dental work done, tell your dentist you are receiving this medicine. Avoid taking products that contain aspirin, acetaminophen, ibuprofen, naproxen, or ketoprofen unless instructed by your doctor. These medicines may hide a fever. Women should inform their doctor if they wish to become pregnant or think they might be pregnant. There is a potential for serious side effects to an unborn child. Talk to your health care professional or pharmacist for more information. Do not breast-feed an infant while taking this medicine. What side effects may I notice from receiving this medicine? Side effects that you should report to your doctor or health care professional as soon as possible: -allergic reactions like skin rash, itching or hives, swelling of the face, lips, or tongue -low blood counts - this medicine may decrease the number of white blood cells, red blood cells and platelets. You may be at increased risk for infections and bleeding. -signs of infection - fever or chills, cough, sore throat, pain or difficulty passing urine -signs of decreased platelets or bleeding - bruising, pinpoint red spots on the skin, black, tarry stools, blood in the urine -signs of decreased red blood cells - unusually weak or tired, fainting spells, lightheadedness -breathing problems -chest pain -mouth sores -nausea and vomiting -pain, swelling, redness at site where injected -pain, tingling, numbness in the hands or feet -stomach pain -swelling of ankles, feet, hands -unusual bleeding Side effects that usually do not require medical attention (report to your doctor or health care professional if they continue or are bothersome): -constipation -diarrhea -hair loss -loss of appetite -stomach  upset This list may not describe all possible side effects. Call your doctor for medical advice about side effects. You may report side effects to FDA at 1-800-FDA-1088. Where should I keep my medicine? This drug is given in a hospital or clinic and will not be stored at home. NOTE: This sheet is a summary. It may not cover all possible information. If you have questions about this medicine, talk to your doctor, pharmacist, or health care provider.  2018 Elsevier/Gold Standard (2007-05-31 18:45:54)   Carboplatin injection What is this medicine? CARBOPLATIN (KAR boe pla tin) is a chemotherapy drug. It targets fast dividing cells, like cancer cells, and causes these cells to die. This medicine is used to treat ovarian cancer and many other cancers. This medicine may be used for other purposes; ask your health care provider or pharmacist if you have questions. COMMON BRAND NAME(S): Paraplatin What should I tell my health care provider before I take this medicine? They need to know if you have any of these conditions: -blood disorders -hearing problems -kidney disease -recent or ongoing radiation therapy -an unusual or allergic reaction to carboplatin, cisplatin, other chemotherapy, other medicines, foods, dyes, or preservatives -pregnant or trying to get pregnant -breast-feeding How should I use this medicine? This drug is usually given as an infusion into a vein. It is administered in a hospital or clinic by a specially trained health care professional. Talk to your pediatrician regarding the use of this medicine in children. Special care may be needed.  Overdosage: If you think you have taken too much of this medicine contact a poison control center or emergency room at once. NOTE: This medicine is only for you. Do not share this medicine with others. What if I miss a dose? It is important not to miss a dose. Call your doctor or health care professional if you are unable to keep an  appointment. What may interact with this medicine? -medicines for seizures -medicines to increase blood counts like filgrastim, pegfilgrastim, sargramostim -some antibiotics like amikacin, gentamicin, neomycin, streptomycin, tobramycin -vaccines Talk to your doctor or health care professional before taking any of these medicines: -acetaminophen -aspirin -ibuprofen -ketoprofen -naproxen This list may not describe all possible interactions. Give your health care provider a list of all the medicines, herbs, non-prescription drugs, or dietary supplements you use. Also tell them if you smoke, drink alcohol, or use illegal drugs. Some items may interact with your medicine. What should I watch for while using this medicine? Your condition will be monitored carefully while you are receiving this medicine. You will need important blood work done while you are taking this medicine. This drug may make you feel generally unwell. This is not uncommon, as chemotherapy can affect healthy cells as well as cancer cells. Report any side effects. Continue your course of treatment even though you feel ill unless your doctor tells you to stop. In some cases, you may be given additional medicines to help with side effects. Follow all directions for their use. Call your doctor or health care professional for advice if you get a fever, chills or sore throat, or other symptoms of a cold or flu. Do not treat yourself. This drug decreases your body's ability to fight infections. Try to avoid being around people who are sick. This medicine may increase your risk to bruise or bleed. Call your doctor or health care professional if you notice any unusual bleeding. Be careful brushing and flossing your teeth or using a toothpick because you may get an infection or bleed more easily. If you have any dental work done, tell your dentist you are receiving this medicine. Avoid taking products that contain aspirin, acetaminophen,  ibuprofen, naproxen, or ketoprofen unless instructed by your doctor. These medicines may hide a fever. Do not become pregnant while taking this medicine. Women should inform their doctor if they wish to become pregnant or think they might be pregnant. There is a potential for serious side effects to an unborn child. Talk to your health care professional or pharmacist for more information. Do not breast-feed an infant while taking this medicine. What side effects may I notice from receiving this medicine? Side effects that you should report to your doctor or health care professional as soon as possible: -allergic reactions like skin rash, itching or hives, swelling of the face, lips, or tongue -signs of infection - fever or chills, cough, sore throat, pain or difficulty passing urine -signs of decreased platelets or bleeding - bruising, pinpoint red spots on the skin, black, tarry stools, nosebleeds -signs of decreased red blood cells - unusually weak or tired, fainting spells, lightheadedness -breathing problems -changes in hearing -changes in vision -chest pain -high blood pressure -low blood counts - This drug may decrease the number of white blood cells, red blood cells and platelets. You may be at increased risk for infections and bleeding. -nausea and vomiting -pain, swelling, redness or irritation at the injection site -pain, tingling, numbness in the hands or feet -problems with balance, talking, walking -trouble   passing urine or change in the amount of urine Side effects that usually do not require medical attention (report to your doctor or health care professional if they continue or are bothersome): -hair loss -loss of appetite -metallic taste in the mouth or changes in taste This list may not describe all possible side effects. Call your doctor for medical advice about side effects. You may report side effects to FDA at 1-800-FDA-1088. Where should I keep my medicine? This drug  is given in a hospital or clinic and will not be stored at home. NOTE: This sheet is a summary. It may not cover all possible information. If you have questions about this medicine, talk to your doctor, pharmacist, or health care provider.  2018 Elsevier/Gold Standard (2007-04-26 14:38:05).

## 2017-06-02 NOTE — Progress Notes (Signed)
Patient Care Team: Forrest Moron, MD as PCP - General (Internal Medicine) Nicholas Lose, MD as Consulting Physician (Hematology and Oncology)  DIAGNOSIS:  Encounter Diagnosis  Name Primary?  . Malignant neoplasm of lower-outer quadrant of right breast of female, estrogen receptor positive ([place])     SUMMARY OF ONCOLOGIC HISTORY:   Breast cancer of lower-outer quadrant of right female breast ([place])   09/08/2012 Initial Diagnosis    Breast cancer of lower-outer quadrant of right female breast: ER 99% PR 100% HER-2 negative, Ki-67 70%      10/07/2012 - 03/17/2013 Neo-Adjuvant Chemotherapy    Dose dense FEC followed by weekly Taxol      04/06/2013 Surgery    Right breast lumpectomy residual 1.1 cm IDC grade 3 with high-grade DCIS, PNI +ve, 1/2 SLN positive, no MVI, T1 C. N1 A. stage IIA, ER 97% PR 95% HER-2 1.1 ratio negative      05/08/2013 - 06/22/2013 Radiation Therapy    Radiation therapy to lumpectomy site      07/03/2013 - 04/27/2017 Anti-estrogen oral therapy    Zoladex plus Aromasin       Procedure    Genetic testing:PTEN C.-([id number])del 26 on [employer] testing      04/15/2017 Relapse/Recurrence    Right breast biopsy: Invasive ductal carcinoma, grade 3, ER 10%, PR 0%, Ki-67 30%, HER-2 equivocal, ratio 1.36, copy #5.2, IHC 1+ negative      04/22/2017 Breast MRI    Malignancy LOQ right breast 2.3 cm within the lumpectomy scar, left breast normal       Malignant neoplasm of lower-outer quadrant of right breast of female, estrogen receptor positive ([place])   04/28/2017 Initial Diagnosis    Malignant neoplasm of lower-outer quadrant of right breast of female, estrogen receptor positive ([place])      04/28/2017 -  Chemotherapy    The patient had palonosetron (ALOXI) injection 0.25 mg, 0.25 mg, Intravenous,  Once, 0 of 6 cycles CARBOplatin (PARAPLATIN) 290 mg in sodium chloride 0.9 % 100 mL chemo infusion, 290 mg (100 % of original dose 289.6 mg), Intravenous,  Once, 0 of 6  cycles Dose modification:   (original dose 289.6 mg, Cycle 1) gemcitabine (GEMZAR) 2,014 mg in sodium chloride 0.9 % 250 mL chemo infusion, 1,000 mg/m2 = 2,014 mg, Intravenous,  Once, 0 of 6 cycles  for chemotherapy treatment.        CHIEF COMPLIANT: Cycle 1 day 1 of carboplatin and gemcitabine  INTERVAL HISTORY: Mary Dougherty is a 47 year old with above-mentioned history of relapsed right breast cancer who is starting neoadjuvant chemotherapy with carboplatin and gemcitabine and today's cycle 1 day 1.  She is anxious to get start with the treatment.  REVIEW OF SYSTEMS:   Constitutional: Denies fevers, chills or abnormal weight loss Eyes: Denies blurriness of vision Ears, nose, mouth, throat, and face: Denies mucositis or sore throat Respiratory: Denies cough, dyspnea or wheezes Cardiovascular: Denies palpitation, chest discomfort Gastrointestinal:  Denies nausea, heartburn or change in bowel habits Skin: Denies abnormal skin rashes Lymphatics: Denies new lymphadenopathy or easy bruising Neurological:Denies numbness, tingling or new weaknesses Behavioral/Psych: Mood is stable, no new changes  Extremities: No lower extremity edema  All other systems were reviewed with the patient and are negative.  I have reviewed the past medical history, past surgical history, social history and family history with the patient and they are unchanged from previous note.  ALLERGIES:  has No Known Allergies.  MEDICATIONS:  Current Outpatient Medications  Medication Sig Dispense  Refill  . cetirizine HCl (ZYRTEC) 5 MG/5ML SOLN Take 5 mLs (5 mg total) by mouth daily. 300 mL   . metFORMIN (GLUCOPHAGE) 500 MG tablet TAKE 1 TABLET BY MOUTH EVERY DAY WITH BREAKFAST 30 tablet 0  . ondansetron (ZOFRAN) 8 MG tablet Take 1 tablet (8 mg total) by mouth every 8 (eight) hours as needed for nausea. 30 tablet 3  . prochlorperazine (COMPAZINE) 10 MG tablet Take 1 tablet (10 mg total) by mouth every 6 (six)  hours as needed for nausea or vomiting. 30 tablet 0   No current facility-administered medications for this visit.     PHYSICAL EXAMINATION: ECOG PERFORMANCE STATUS: 0 - Asymptomatic  Vitals:   06/02/17 0822  BP: 121/62  Pulse: 64  Resp: 18  Temp: 97.6 F (36.4 C)  SpO2: 100%   Filed Weights   06/02/17 0822  Weight: 191 lb 11.2 oz (87 kg)    GENERAL:alert, no distress and comfortable SKIN: skin color, texture, turgor are normal, no rashes or significant lesions EYES: normal, Conjunctiva are pink and non-injected, sclera clear OROPHARYNX:no exudate, no erythema and lips, buccal mucosa, and tongue normal  NECK: supple, thyroid normal size, non-tender, without nodularity LYMPH:  no palpable lymphadenopathy in the cervical, axillary or inguinal LUNGS: clear to auscultation and percussion with normal breathing effort HEART: regular rate & rhythm and no murmurs and no lower extremity edema ABDOMEN:abdomen soft, non-tender and normal bowel sounds MUSCULOSKELETAL:no cyanosis of digits and no clubbing  NEURO: alert & oriented x 3 with fluent speech, no focal motor/sensory deficits EXTREMITIES: No lower extremity edema  LABORATORY DATA:  I have reviewed the data as listed CMP Latest Ref Rng & Units 05/18/2017 05/01/2017 12/04/2015  Glucose 65 - 99 mg/dL 127(H) 222(H) 212(H)  BUN 6 - 20 mg/dL _0 Creatinine 0.44 - 1.00 mg/dL 0.60 0.67 0.76  Sodium 135 - 145 mmol/L 136 137 136  Potassium 3.5 - 5.1 mmol/L 3.5 4.2 3.6  Chloride 101 - 111 mmol/L 104 101 102  CO2 22 - 32 mmol/L _1 Calcium 8.9 - 10.3 mg/dL 8.6(L) 8.9 8.6(L)  Total Protein 6.0 - 8.5 g/dL - 7.1 -  Total Bilirubin 0.0 - 1.2 mg/dL - 1.4(H) -  Alkaline Phos 39 - 117 IU/L - 109 -  AST 0 - 40 IU/L - 23 -  ALT 0 - 32 IU/L - 29 -    Lab Results  Component Value Date   WBC 9.5 06/02/2017   HGB 9.7 (L) 06/02/2017   HCT 27.8 (L) 06/02/2017   MCV 83.4 06/02/2017   PLT 192 06/02/2017   NEUTROABS 7.0 (H) 06/02/2017     ASSESSMENT & PLAN:  Breast cancer of lower-outer quadrant of right female breast Right breast invasive ductal carcinoma ER/PR positive HER-2 negative stage II A. T3, N1, M0 with PTEN mutation Prior treatment: Adjuvant Zoladex and exemestane started 07/03/2013, plan is for 5 years; changed to every 3 month Zoladex injection from May 2016 ------------------------------------------------------------------------------ Breast MRI 04/22/2017: 2.3 cm malignancy lower outer quadrant right breast within the lumpectomy scar  Recommendation: 1.  Neoadjuvant chemotherapy with carboplatin and gemcitabine day 1 and 8 every 3 weeks for 6 cycles 2. followed by mastectomy 3.  Followed by continued antiestrogen therapy  PET/CT scan: Negative for distant metastasis -------------------------------------------------------------------------------- Current treatment: Cycle 1 day 1 carboplatin and gemcitabine  Chemo education completed Chemo consent obtained Labs reviewed Antiemetics were reviewed  Normocytic anemia: Due to chronic disease We will need to monitor  her anemia. Diabetes mellitus: I will remove dexamethasone from her treatment plan  Return to clinic in 1 week for cycle 1 day 8 and for toxicity evaluation.   No orders of the defined types were placed in this encounter.  The patient has a good understanding of the overall plan. she agrees with it. she will call with any problems that may develop before the next visit here.   Harriette Ohara, MD 06/02/17

## 2017-06-02 NOTE — Progress Notes (Signed)
Discharge instructions reviewed and given to the pt. Patient verbalized understanding.

## 2017-06-02 NOTE — Assessment & Plan Note (Signed)
Right breast invasive ductal carcinoma ER/PR positive HER-2 negative stage II A. T3, N1, M0 with PTEN mutation Prior treatment: Adjuvant Zoladex and exemestane started 07/03/2013, plan is for 5 years; changed to every 3 month Zoladex injection from May 2016 ------------------------------------------------------------------------------ Breast MRI 04/22/2017: 2.3 cm malignancy lower outer quadrant right breast within the lumpectomy scar  Recommendation: 1.  Neoadjuvant chemotherapy with carboplatin and gemcitabine day 1 and 8 every 3 weeks for 6 cycles 2. followed by mastectomy 3.  Followed by continued antiestrogen therapy  PET/CT scan: Negative for distant metastasis --------------------------------------------------------------------------------Current treatment: Cycle 1 day 1 carboplatin and gemcitabine  Chemo education completed Chemo consent obtained Labs reviewed Antiemetics were reviewed Return to clinic in 1 week for cycle 1 day 8 and for toxicity evaluation.

## 2017-06-03 ENCOUNTER — Telehealth: Payer: Self-pay | Admitting: Emergency Medicine

## 2017-06-03 NOTE — Telephone Encounter (Signed)
TCT patient for firs ttime chemo follow up, no answer.  Left message asking pt to call back.

## 2017-06-03 NOTE — Telephone Encounter (Signed)
-----   Message from [place], [place] sent at 06/02/2017 12:05 PM EDT ----- Regarding: Dr.Gudena first time chemo follow up call First time Gemzar and carboplatin infusion, pt tolerated well the infusion.

## 2017-06-04 ENCOUNTER — Other Ambulatory Visit: Payer: Self-pay | Admitting: Physician Assistant

## 2017-06-04 DIAGNOSIS — R0981 Nasal congestion: Secondary | ICD-10-CM

## 2017-06-09 ENCOUNTER — Inpatient Hospital Stay: Payer: BLUE CROSS/BLUE SHIELD

## 2017-06-09 ENCOUNTER — Other Ambulatory Visit: Payer: Self-pay | Admitting: *Deleted

## 2017-06-09 VITALS — BP 107/59 | HR 76 | Temp 98.2°F | Resp 18

## 2017-06-09 DIAGNOSIS — Z170 Estrogen receptor positive status [ER+]: Principal | ICD-10-CM

## 2017-06-09 DIAGNOSIS — D649 Anemia, unspecified: Secondary | ICD-10-CM

## 2017-06-09 DIAGNOSIS — Z95828 Presence of other vascular implants and grafts: Secondary | ICD-10-CM

## 2017-06-09 DIAGNOSIS — C50511 Malignant neoplasm of lower-outer quadrant of right female breast: Secondary | ICD-10-CM

## 2017-06-09 DIAGNOSIS — Z5111 Encounter for antineoplastic chemotherapy: Secondary | ICD-10-CM | POA: Diagnosis not present

## 2017-06-09 LAB — CMP (CANCER CENTER ONLY)
ALBUMIN: 3.8 g/dL (ref 3.5–5.0)
ALT: 64 U/L — AB (ref 0–55)
AST: 30 U/L (ref 5–34)
Alkaline Phosphatase: 94 U/L (ref 40–150)
Anion gap: 5 (ref 3–11)
BILIRUBIN TOTAL: 1.2 mg/dL (ref 0.2–1.2)
BUN: 12 mg/dL (ref 7–26)
CHLORIDE: 108 mmol/L (ref 98–109)
CO2: 27 mmol/L (ref 22–29)
CREATININE: 0.67 mg/dL (ref 0.60–1.10)
Calcium: 9.1 mg/dL (ref 8.4–10.4)
GFR, Est AFR Am: >60 mL/min (ref >60)
GLUCOSE: 159 mg/dL — AB (ref 70–140)
POTASSIUM: 4 mmol/L (ref 3.5–5.1)
Sodium: 140 mmol/L (ref 136–145)
TOTAL PROTEIN: 6.8 g/dL (ref 6.4–8.3)

## 2017-06-09 LAB — CBC WITH DIFFERENTIAL (CANCER CENTER ONLY)
Basophils Absolute: 0 10*3/uL (ref 0.0–0.1)
Basophils Relative: 0 %
EOS PCT: 1 %
Eosinophils Absolute: 0.1 10*3/uL (ref 0.0–0.5)
HEMATOCRIT: 21.6 % — AB (ref 34.8–46.6)
Hemoglobin: 7.4 g/dL — ABNORMAL LOW (ref 11.6–15.9)
LYMPHS PCT: 35 %
Lymphs Abs: 1.7 10*3/uL (ref 0.9–3.3)
MCH: 27.8 pg (ref 25.1–34.0)
MCHC: 34.3 g/dL (ref 31.5–36.0)
MCV: 81.2 fL (ref 79.5–101.0)
MONO ABS: 0.3 10*3/uL (ref 0.1–0.9)
MONOS PCT: 5 %
NEUTROS ABS: 2.9 10*3/uL (ref 1.5–6.5)
Neutrophils Relative %: 59 %
PLATELETS: 144 10*3/uL — AB (ref 145–400)
RBC: 2.66 MIL/uL — ABNORMAL LOW (ref 3.70–5.45)
RDW: 18.6 % — ABNORMAL HIGH (ref 11.2–14.5)
WBC Count: 5 10*3/uL (ref 3.9–10.3)

## 2017-06-09 LAB — ABO/RH: ABO/RH(D): B POS

## 2017-06-09 LAB — PREPARE RBC (CROSSMATCH)

## 2017-06-09 MED ORDER — PALONOSETRON HCL INJECTION 0.25 MG/5ML
INTRAVENOUS | Status: AC
  Filled 2017-06-09: qty 5

## 2017-06-09 MED ORDER — SODIUM CHLORIDE 0.9 % IV SOLN
1000.0000 mg/m2 | Freq: Once | INTRAVENOUS | Status: AC
  Administered 2017-06-09: 2014 mg via INTRAVENOUS
  Filled 2017-06-09: qty 52.97

## 2017-06-09 MED ORDER — HEPARIN SOD (PORK) LOCK FLUSH 100 UNIT/ML IV SOLN
500.0000 [IU] | Freq: Once | INTRAVENOUS | Status: AC | PRN
  Administered 2017-06-09: 500 [IU]
  Filled 2017-06-09: qty 5

## 2017-06-09 MED ORDER — GOSERELIN ACETATE 10.8 MG ~~LOC~~ IMPL
10.8000 mg | DRUG_IMPLANT | Freq: Once | SUBCUTANEOUS | Status: DC
  Filled 2017-06-09: qty 10.8

## 2017-06-09 MED ORDER — SODIUM CHLORIDE 0.9% FLUSH
10.0000 mL | INTRAVENOUS | Status: DC | PRN
  Administered 2017-06-09: 10 mL
  Filled 2017-06-09: qty 10

## 2017-06-09 MED ORDER — SODIUM CHLORIDE 0.9% FLUSH
10.0000 mL | INTRAVENOUS | Status: DC | PRN
  Administered 2017-06-09: 10 mL via INTRAVENOUS
  Filled 2017-06-09: qty 10

## 2017-06-09 MED ORDER — DIPHENHYDRAMINE HCL 25 MG PO CAPS
ORAL_CAPSULE | ORAL | Status: AC
  Filled 2017-06-09: qty 1

## 2017-06-09 MED ORDER — SODIUM CHLORIDE 0.9 % IV SOLN
289.6000 mg | Freq: Once | INTRAVENOUS | Status: AC
  Administered 2017-06-09: 290 mg via INTRAVENOUS
  Filled 2017-06-09: qty 29

## 2017-06-09 MED ORDER — SODIUM CHLORIDE 0.9 % IV SOLN
250.0000 mL | Freq: Once | INTRAVENOUS | Status: AC
  Administered 2017-06-09: 250 mL via INTRAVENOUS

## 2017-06-09 MED ORDER — SODIUM CHLORIDE 0.9 % IV SOLN
Freq: Once | INTRAVENOUS | Status: AC
  Administered 2017-06-09: 09:00:00 via INTRAVENOUS

## 2017-06-09 MED ORDER — ACETAMINOPHEN 325 MG PO TABS
ORAL_TABLET | ORAL | Status: AC
  Filled 2017-06-09: qty 2

## 2017-06-09 MED ORDER — PALONOSETRON HCL INJECTION 0.25 MG/5ML
0.2500 mg | Freq: Once | INTRAVENOUS | Status: AC
  Administered 2017-06-09: 0.25 mg via INTRAVENOUS

## 2017-06-09 MED ORDER — ACETAMINOPHEN 325 MG PO TABS
650.0000 mg | ORAL_TABLET | Freq: Once | ORAL | Status: AC
  Administered 2017-06-09: 650 mg via ORAL

## 2017-06-09 MED ORDER — DIPHENHYDRAMINE HCL 25 MG PO CAPS
25.0000 mg | ORAL_CAPSULE | Freq: Once | ORAL | Status: AC
  Administered 2017-06-09: 25 mg via ORAL

## 2017-06-09 MED ORDER — GOSERELIN ACETATE 3.6 MG ~~LOC~~ IMPL
DRUG_IMPLANT | SUBCUTANEOUS | Status: AC
  Filled 2017-06-09: qty 3.6

## 2017-06-09 NOTE — Progress Notes (Signed)
Okay to treat with today's CBC per Dr. Lindi Adie.

## 2017-06-09 NOTE — Patient Instructions (Addendum)
[facility] Discharge Instructions for Patients Receiving Chemotherapy  Today you received the following chemotherapy agents Gemzar,Carboplatin To help prevent nausea and vomiting after your treatment, we encourage you to take your nausea medication as directed   If you develop nausea and vomiting that is not controlled by your nausea medication, call the clinic.   BELOW ARE SYMPTOMS THAT SHOULD BE REPORTED IMMEDIATELY:  *FEVER GREATER THAN 100.5 F  *CHILLS WITH OR WITHOUT FEVER  NAUSEA AND VOMITING THAT IS NOT CONTROLLED WITH YOUR NAUSEA MEDICATION  *UNUSUAL SHORTNESS OF BREATH  *UNUSUAL BRUISING OR BLEEDING  TENDERNESS IN MOUTH AND THROAT WITH OR WITHOUT PRESENCE OF ULCERS  *URINARY PROBLEMS  *BOWEL PROBLEMS  UNUSUAL RASH Items with * indicate a potential emergency and should be followed up as soon as possible.  Feel free to call the clinic should you have any questions or concerns. The clinic phone number is [phone].  Please show the [facility] at check-in to the Emergency Department and triage nurse.    Blood Transfusion, Care After This sheet gives you information about how to care for yourself after your procedure. Your doctor may also give you more specific instructions. If you have problems or questions, contact your doctor. Follow these instructions at home:  Take over-the-counter and prescription medicines only as told by your doctor.  Go back to your normal activities as told by your doctor.  Follow instructions from your doctor about how to take care of the area where an IV tube was put into your vein (insertion site). Make sure you: ? Wash your hands with soap and water before you change your bandage (dressing). If there is no soap and water, use hand sanitizer. ? Change your bandage as told by your doctor.  Check your IV insertion site every day for signs of infection. Check for: ? More redness, swelling, or pain. ? More fluid  or blood. ? Warmth. ? Pus or a bad smell. Contact a doctor if:  You have more redness, swelling, or pain around the IV insertion site..  You have more fluid or blood coming from the IV insertion site.  Your IV insertion site feels warm to the touch.  You have pus or a bad smell coming from the IV insertion site.  Your pee (urine) turns pink, red, or brown.  You feel weak after doing your normal activities. Get help right away if:  You have signs of a serious allergic or body defense (immune) system reaction, including: ? Itchiness. ? Hives. ? Trouble breathing. ? Anxiety. ? Pain in your chest or lower back. ? Fever, flushing, and chills. ? Fast pulse. ? Rash. ? Watery poop (diarrhea). ? Throwing up (vomiting). ? Dark pee. ? Serious headache. ? Dizziness. ? Stiff neck. ? Yellow color in your face or the white parts of your eyes (jaundice). Summary  After a blood transfusion, return to your normal activities as told by your doctor.  Every day, check for signs of infection where the IV tube was put into your vein.  Some signs of infection are warm skin, more redness and pain, more fluid or blood, and pus or a bad smell where the needle went in.  Contact your doctor if you feel weak or have any unusual symptoms. This information is not intended to replace advice given to you by your health care provider. Make sure you discuss any questions you have with your health care provider. Document Released: 02/09/2014 Document Revised: 09/13/2015 Document Reviewed:  09/13/2015 Elsevier Interactive Patient Education  2017 [employer].

## 2017-06-09 NOTE — Patient Instructions (Signed)

## 2017-06-10 LAB — TYPE AND SCREEN
ABO/RH(D): B POS
ANTIBODY SCREEN: NEGATIVE
UNIT DIVISION: 0

## 2017-06-10 LAB — BPAM RBC
BLOOD PRODUCT EXPIRATION DATE: 201905232359
ISSUE DATE / TIME: 201905081407
UNIT TYPE AND RH: 7300

## 2017-06-11 ENCOUNTER — Other Ambulatory Visit: Payer: Self-pay

## 2017-06-11 ENCOUNTER — Observation Stay (HOSPITAL_COMMUNITY)
Admission: EM | Admit: 2017-06-11 | Discharge: 2017-06-12 | Disposition: A | Discharge status: Home / Self Care | Payer: BLUE CROSS/BLUE SHIELD | Attending: Family Medicine | Admitting: Family Medicine

## 2017-06-11 ENCOUNTER — Encounter (HOSPITAL_COMMUNITY): Payer: Self-pay

## 2017-06-11 ENCOUNTER — Emergency Department (HOSPITAL_COMMUNITY): Payer: BLUE CROSS/BLUE SHIELD

## 2017-06-11 DIAGNOSIS — Z7984 Long term (current) use of oral hypoglycemic drugs: Secondary | ICD-10-CM | POA: Insufficient documentation

## 2017-06-11 DIAGNOSIS — D696 Thrombocytopenia, unspecified: Secondary | ICD-10-CM

## 2017-06-11 DIAGNOSIS — Z8249 Family history of ischemic heart disease and other diseases of the circulatory system: Secondary | ICD-10-CM | POA: Diagnosis not present

## 2017-06-11 DIAGNOSIS — Z79899 Other long term (current) drug therapy: Secondary | ICD-10-CM | POA: Insufficient documentation

## 2017-06-11 DIAGNOSIS — R7989 Other specified abnormal findings of blood chemistry: Secondary | ICD-10-CM | POA: Insufficient documentation

## 2017-06-11 DIAGNOSIS — I959 Hypotension, unspecified: Secondary | ICD-10-CM

## 2017-06-11 DIAGNOSIS — C50511 Malignant neoplasm of lower-outer quadrant of right female breast: Secondary | ICD-10-CM | POA: Diagnosis not present

## 2017-06-11 DIAGNOSIS — D580 Hereditary spherocytosis: Secondary | ICD-10-CM | POA: Insufficient documentation

## 2017-06-11 DIAGNOSIS — Z853 Personal history of malignant neoplasm of breast: Secondary | ICD-10-CM | POA: Insufficient documentation

## 2017-06-11 DIAGNOSIS — Z923 Personal history of irradiation: Secondary | ICD-10-CM | POA: Insufficient documentation

## 2017-06-11 DIAGNOSIS — R55 Syncope and collapse: Principal | ICD-10-CM

## 2017-06-11 DIAGNOSIS — K8020 Calculus of gallbladder without cholecystitis without obstruction: Secondary | ICD-10-CM | POA: Diagnosis not present

## 2017-06-11 DIAGNOSIS — D649 Anemia, unspecified: Secondary | ICD-10-CM

## 2017-06-11 DIAGNOSIS — K760 Fatty (change of) liver, not elsewhere classified: Secondary | ICD-10-CM | POA: Diagnosis not present

## 2017-06-11 DIAGNOSIS — Z9221 Personal history of antineoplastic chemotherapy: Secondary | ICD-10-CM | POA: Diagnosis not present

## 2017-06-11 DIAGNOSIS — Z170 Estrogen receptor positive status [ER+]: Secondary | ICD-10-CM | POA: Diagnosis not present

## 2017-06-11 DIAGNOSIS — E119 Type 2 diabetes mellitus without complications: Secondary | ICD-10-CM | POA: Insufficient documentation

## 2017-06-11 DIAGNOSIS — D630 Anemia in neoplastic disease: Secondary | ICD-10-CM | POA: Diagnosis not present

## 2017-06-11 LAB — CBC WITH DIFFERENTIAL/PLATELET
BASOS PCT: 0 %
Basophils Absolute: 0 10*3/uL (ref 0.0–0.1)
Eosinophils Absolute: 0 10*3/uL (ref 0.0–0.7)
Eosinophils Relative: 1 %
HEMATOCRIT: 20.9 % — AB (ref 36.0–46.0)
HEMOGLOBIN: 7.2 g/dL — AB (ref 12.0–15.0)
LYMPHS ABS: 0.4 10*3/uL — AB (ref 0.7–4.0)
Lymphocytes Relative: 12 %
MCH: 28.3 pg (ref 26.0–34.0)
MCHC: 34.4 g/dL (ref 30.0–36.0)
MCV: 82.3 fL (ref 78.0–100.0)
MONOS PCT: 4 %
Monocytes Absolute: 0.1 10*3/uL (ref 0.1–1.0)
NEUTROS ABS: 3 10*3/uL (ref 1.7–7.7)
NEUTROS PCT: 83 %
Platelets: 116 10*3/uL — ABNORMAL LOW (ref 150–400)
RBC: 2.54 MIL/uL — ABNORMAL LOW (ref 3.87–5.11)
RDW: 17.9 % — ABNORMAL HIGH (ref 11.5–15.5)
WBC: 3.6 10*3/uL — ABNORMAL LOW (ref 4.0–10.5)

## 2017-06-11 LAB — URINALYSIS, ROUTINE W REFLEX MICROSCOPIC
BACTERIA UA: NONE SEEN
BILIRUBIN URINE: NEGATIVE
GLUCOSE, UA: NEGATIVE mg/dL
HGB URINE DIPSTICK: NEGATIVE
Ketones, ur: 5 mg/dL — AB
NITRITE: NEGATIVE
PROTEIN: 30 mg/dL — AB
Specific Gravity, Urine: 1.015 (ref 1.005–1.030)
pH: 5 (ref 5.0–8.0)

## 2017-06-11 LAB — COMPREHENSIVE METABOLIC PANEL
ALT: 103 U/L — AB (ref 14–54)
AST: 63 U/L — ABNORMAL HIGH (ref 15–41)
Albumin: 3.5 g/dL (ref 3.5–5.0)
Alkaline Phosphatase: 65 U/L (ref 38–126)
Anion gap: 8 (ref 5–15)
BUN: 14 mg/dL (ref 6–20)
CHLORIDE: 108 mmol/L (ref 101–111)
CO2: 22 mmol/L (ref 22–32)
Calcium: 7.7 mg/dL — ABNORMAL LOW (ref 8.9–10.3)
Creatinine, Ser: 0.64 mg/dL (ref 0.44–1.00)
GFR calc Af Amer: >60 mL/min (ref >60)
GLUCOSE: 182 mg/dL — AB (ref 65–99)
POTASSIUM: 4 mmol/L (ref 3.5–5.1)
SODIUM: 138 mmol/L (ref 135–145)
Total Bilirubin: 1.5 mg/dL — ABNORMAL HIGH (ref 0.3–1.2)
Total Protein: 6.1 g/dL — ABNORMAL LOW (ref 6.5–8.1)

## 2017-06-11 LAB — CBC
HEMATOCRIT: 25.1 % — AB (ref 36.0–46.0)
HEMOGLOBIN: 8.4 g/dL — AB (ref 12.0–15.0)
MCH: 27.7 pg (ref 26.0–34.0)
MCHC: 33.5 g/dL (ref 30.0–36.0)
MCV: 82.8 fL (ref 78.0–100.0)
Platelets: 120 10*3/uL — ABNORMAL LOW (ref 150–400)
RBC: 3.03 MIL/uL — AB (ref 3.87–5.11)
RDW: 18 % — ABNORMAL HIGH (ref 11.5–15.5)
WBC: 4.2 10*3/uL (ref 4.0–10.5)

## 2017-06-11 LAB — SAMPLE TO BLOOD BANK

## 2017-06-11 LAB — LACTIC ACID, PLASMA: Lactic Acid, Venous: 1.1 mmol/L (ref 0.5–1.9)

## 2017-06-11 LAB — PREPARE RBC (CROSSMATCH)

## 2017-06-11 LAB — TROPONIN I

## 2017-06-11 MED ORDER — POLYETHYLENE GLYCOL 3350 17 G PO PACK: 17.0000 g | PACK | Freq: Every day | ORAL | Status: DC | PRN

## 2017-06-11 MED ORDER — ONDANSETRON HCL 4 MG PO TABS: 8.0000 mg | ORAL_TABLET | Freq: Three times a day (TID) | ORAL | Status: DC | PRN

## 2017-06-11 MED ORDER — PROCHLORPERAZINE MALEATE 10 MG PO TABS
10.0000 mg | ORAL_TABLET | Freq: Four times a day (QID) | ORAL | Status: DC | PRN
  Filled 2017-06-11: qty 1

## 2017-06-11 MED ORDER — SODIUM CHLORIDE 0.9 % IV BOLUS
1000.0000 mL | Freq: Once | INTRAVENOUS | Status: AC
  Administered 2017-06-11: 1000 mL via INTRAVENOUS

## 2017-06-11 MED ORDER — SODIUM CHLORIDE 0.9 % IV SOLN
INTRAVENOUS | Status: DC
  Administered 2017-06-11: 15:00:00 via INTRAVENOUS

## 2017-06-11 MED ORDER — SODIUM CHLORIDE 0.9 % IV SOLN: INTRAVENOUS | Status: DC

## 2017-06-11 MED ORDER — SODIUM CHLORIDE 0.9 % IV BOLUS
1000.0000 mL | Freq: Once | INTRAVENOUS | Status: AC
Start: 2017-06-11 — End: 2017-06-11
  Administered 2017-06-11: 1000 mL via INTRAVENOUS

## 2017-06-11 MED ORDER — SODIUM CHLORIDE 0.9 % IV SOLN
10.0000 mL/h | Freq: Once | INTRAVENOUS | Status: AC
  Administered 2017-06-11: 10 mL/h via INTRAVENOUS

## 2017-06-11 MED ORDER — TRAZODONE HCL 50 MG PO TABS: 50.0000 mg | ORAL_TABLET | Freq: Once | ORAL | Status: DC

## 2017-06-11 MED ORDER — METFORMIN HCL 500 MG PO TABS
500.0000 mg | ORAL_TABLET | Freq: Every day | ORAL | Status: DC
  Administered 2017-06-12: 500 mg via ORAL
  Filled 2017-06-11: qty 1

## 2017-06-11 NOTE — Progress Notes (Signed)
Hem-Onc Chart reviewed and patient was seen in ED Mary Dougherty is a 47 year old who had relapsed breast cancer and received first cycle of carboplatin and gemcitabine chemotherapy on Jun 02, 2017 and day 8 of chemotherapy on Jun 09, 2017. She was noted to be severely anemic and gave her 1 unit of PRBC on 5/8. After receiving chemotherapy she felt markedly worse. She came into the emergency room after syncope with hypotension and hemoglobin of 7.2. She is being admitted for supportive care. Chest Xray, CT Head and [place] abd were normal I spoke to the patient and her family. Thanks for your help in taking care of her

## 2017-06-11 NOTE — H&P (Signed)
HPI  Mary Dougherty ZOX:[id number] DOB: [birth year] DOA: 06/11/2017  PCP: Forrest Moron, MD   Chief Complaint: Syncope weakness  HPI:  73 known history right breast cancer S/B XRT, hereditary spherocytosis, repeat blood transfusions, DM TY 2, chronic anemia-under care of Dr. Cyndi Bender see excellent summarization 06/02/2017 in his progress note] Has been on neoadjuvant carboplatin gemcitabine and continued antiestrogen and currently is on cycle 1 of carboplatinum and gemcitabine which she started on 5/1--day 8 chemotherapy 5/8   Baseline hemoglobin seems to be in the 9-10 range as of 05/18/2017--transfused on 5/8--  States that after 5 8 treatment felt weak and fatigued he did not go to work on 5/9 this a.m. was tending to her yard and livestock--felt a little woozy--Passed out in recliner after feeling lightheaded This is happened to her before-whenever she gets a scare or Friday she seems to become lightheaded  Present to ED-with low hemoglobin and had blood transfusion at [facility] for low hemoglobin as well blood pressure initially 60/40 status post bolus 93/53 and given 1000 cc en route from EMS  ED Course: kept on ivf given NS 125 cc/h  Review of Systems:   Negative for fever, visual changes, sore throat, rash, new muscle aches, chest pain, SOB, dysuria, bleeding, n/v/abdominal pain.  He did have an episode which was slight unresponsiveness but this completely resolved  Past Medical History:  Diagnosis Date  . Anemia   . Blood transfusion without reported diagnosis   . Chronic anemia 03/03/13  . Diabetes mellitus without complication ([place])    diabetes II  . Dyspnea    with exertion  . Family history of adverse reaction to anesthesia     father- hard to awaken  . Hereditary spherocytosis ([place])   . Invasive ductal carcinoma of right breast ([place]) 09/2012   ER(99%), PR(100%), Ki-67(70%), 1/5 nodes positive  . S/P radiation therapy 05/08/2013-06/22/2013   1) Right  breast / 50.4 Gy in 28 fractions / 2) Right Supraclavicular fossa/ 50.4 Gy in 28 fractions /3) Right Posterior Axillary boost / 11.9 Gy in 28 fractions /4) Right breast boost / 10 Gy in 5 fractions   . Spherocytosis ([place]) 1989  . Status post chemotherapy 10/07/12 - 12/16/12    Neoadjuvant chemotherapy with dose dense FEC (5-FU/epirubicin/Cytoxan) from 10/07/2012 through 12/16/12.    Marland Kitchen Syncope and collapse 1989   "thats when I found out I have Spherocytosis"  . Thyroid disease   . Use of exemestane (Aromasin) 07/25/13  . Use of goserelin acetate (Zoladex) 07/25/13  . Wears glasses     Past Surgical History:  Procedure Laterality Date  . BREAST LUMPECTOMY WITH SENTINEL LYMPH NODE BIOPSY Right 04/06/2013   [name]  . BREAST SURGERY     left breast lumpectomy/left ax snbx  . COLONOSCOPY  2015  . PORT-A-CATH REMOVAL N/A 04/06/2013   Procedure: REMOVAL PORT-A-CATH;  Surgeon: [name], MD;  Location: [facility];  Service: General;  Laterality: N/A;  . PORTACATH PLACEMENT Left 09/20/2012   Procedure: INSERTION PORT-A-CATH;  Surgeon: [name], MD;  Location: [facility];  Service: General;  Laterality: Left;  . PORTACATH PLACEMENT Right 05/18/2017   Procedure: INSERTION PORT-A-CATH WITH ULTRASOUND;  Surgeon: [name], MD;  Location: [facility];  Service: General;  Laterality: Right;  . WISDOM TOOTH EXTRACTION       reports that she has never smoked. She has never used smokeless tobacco. She reports that she does not drink alcohol or  use drugs. 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 Pulmonary embolism Maternal Uncle 66       died instantly  . Melanoma Cousin        paternal cousin dx in her 51s  . Heart disease Father   . Diabetes Father   . Thyroid disease  Mother      Prior to Admission medications   Medication Sig Start Date End Date Taking? Authorizing Provider  cetirizine HCl (ZYRTEC) 5 MG/5ML SOLN Take 5 mLs (5 mg total) by mouth daily. 06/02/17  Yes Nicholas Lose, MD  folic acid (FOLVITE) 1 MG tablet Take 1 mg by mouth daily.   Yes [provider]  metFORMIN (GLUCOPHAGE) 500 MG tablet TAKE 1 TABLET BY MOUTH EVERY DAY WITH BREAKFAST 05/24/17  Yes Stallings, Zoe A, MD  polyethylene glycol (MIRALAX / GLYCOLAX) packet Take 17 g by mouth daily as needed for mild constipation or moderate constipation.   Yes [provider]  ondansetron (ZOFRAN) 8 MG tablet Take 1 tablet (8 mg total) by mouth every 8 (eight) hours as needed for nausea. 06/02/17   Nicholas Lose, MD  prochlorperazine (COMPAZINE) 10 MG tablet Take 1 tablet (10 mg total) by mouth every 6 (six) hours as needed for nausea or vomiting. 06/02/17   Nicholas Lose, MD    Physical Exam:  Vitals:   06/11/17 1100 06/11/17 1130  BP: (!) 97/51 (!) 95/48  Pulse: 74 79  Resp: (!) 22 19  Temp:    SpO2: 100% 95%     Alert oriented pleasant mildly obese no pallor no icterus no lymphadenopathy no thyromegaly  S1-S2 no murmur rub or gallop  Chest clinically clear no added sound  Abdomen soft no rebound no guarding no lower extremity edema  No CVA tenderness  Neurologically intact moving all 4 limbs equally power 5/5 reflexes 2/3 major muscle groups intact musculoskeletal benign no limitation of R OM psychiatric euthymic  I have personally reviewed following labs and imaging studies  Labs:   plt 116, Hb 7.2, WBC 3.6---> all cell lines depressed from 5/8 down from WBC 5 hemoglobin 7.4 platelet 144  Imaging studies:   Ct head without contrast showed no findings  Medical tests:   EKG independently reviewed: Normal sinus rhythm PR 0.04 early repolarization QRS axis 45 degrees  Test discussed with performing physician:  I have discussed the case with Dr. Thurnell Garbe and  agreed to admission as an inpatient on telemetry floor  Decision to obtain old records:   Reviewed  Review and summation of old records:   Reviewed  Active Problems:   * No active hospital problems. *   Assessment/Plan Pancytopenia anemia and syncope-patient had a fall earlier today in the recliner-patient was given 1 unit packed red blood cells, may need G-CSF agent and I have asked oncology to be consulted by EDP who agrees to do the same Keep 2 large-bore IVs Recheck blood count every 12 and if still below 8 we will give another transfusion Might need dose adjustment of chemo?  Defer to oncologist  More likely than not vasovagal syncope-EKG benign no further issues however keep on telemetry overnight suspect this was secondary to her responsive vagal reflex Had this multiple times before  Dietary spherocytosis-stable at this time defer to oncology/hematology  Diabetes mellitus may continue  metformin at prior to admission doses  Constipation related to chemotherapy/pain medications-continue home regimen       Severity of Illness: The appropriate patient status for this patient is INPATIENT. Inpatient status is judged to be reasonable and necessary in order to provide the required intensity of service to ensure the patient's safety. The patient's presenting symptoms, physical exam findings, and initial radiographic and laboratory data in the context of their chronic comorbidities is felt to place them at high risk for further clinical deterioration. Furthermore, it is not anticipated that the patient will be medically stable for discharge from the hospital within 2 midnights of admission. The following factors support the patient status of inpatient.   " The patient's presenting symptoms include syncope. " The worrisome physical exam findings include syncope collapse and low blood pressure. " The initial radiographic and laboratory data are worrisome because of concern for  worsening. " The chronic co-morbidities include cancer.   * I certify that at the point of admission it is my clinical judgment that the patient will require inpatient hospital care spanning beyond 2 midnights from the point of admission due to high intensity of service, high risk for further deterioration and high frequency of surveillance required.*     DVT prophylaxis: SCDs as patient platelets and other counts are low at this time Code Status: Presumed full Family Communication: None present Consults called: Requested oncologist Dr. Lindi Adie be consulted by EDP  Time spent: 5 minutes  Princes Finger, MD  [facility] Direct contact: [phone] --[place] [place]  --[email or web address]; password [email or web address]  7PM-7AM contact night coverage as above  06/11/2017, 12:50 PM

## 2017-06-11 NOTE — ED Provider Notes (Signed)
[facility] DEPT Provider Note   CSN: [id number] Arrival date & time: 06/11/17  7939     History   Chief Complaint Chief Complaint  Patient presents with  . Hypotension  . Fatigue    HPI Mary Dougherty is a 47 y.o. female.     Pt was seen at 0945. Per pt and her family, c/o sudden onset and resolution of one episode of brief syncope that occurred PTA while sitting in her recliner. Pt states she felt lightheaded before syncopal episode. Pt has been c/o generalized fatigue since her LD chemo 2 days ago (LD before that was 1 week ago). Pt states she received 1 unit PRBC 2 days ago. EMS states pt's SBP was "60," and IVF 1L NS bolus was given with SBP improving to "93." Denies CP/palpitations, no SOB/cough, no abd pain, no N/V/D, no focal motor weakness, no tingling/numbness in extremities, no fevers.    Past Medical History:  Diagnosis Date  . Anemia   . Blood transfusion without reported diagnosis   . Chronic anemia 03/03/13  . Diabetes mellitus without complication ([place])    diabetes II  . Dyspnea    with exertion  . Family history of adverse reaction to anesthesia     father- hard to awaken  . Hereditary spherocytosis ([place])   . Invasive ductal carcinoma of right breast ([place]) 09/2012   ER(99%), PR(100%), Ki-67(70%), 1/5 nodes positive  . S/P radiation therapy 05/08/2013-06/22/2013   1) Right breast / 50.4 Gy in 28 fractions / 2) Right Supraclavicular fossa/ 50.4 Gy in 28 fractions /3) Right Posterior Axillary boost / 11.9 Gy in 28 fractions /4) Right breast boost / 10 Gy in 5 fractions   . Spherocytosis ([place]) 1989  . Status post chemotherapy 10/07/12 - 12/16/12    Neoadjuvant chemotherapy with dose dense FEC (5-FU/epirubicin/Cytoxan) from 10/07/2012 through 12/16/12.    Marland Kitchen Syncope and collapse 1989   "thats when I found out I have Spherocytosis"  . Thyroid disease   . Use of exemestane (Aromasin) 07/25/13  . Use of goserelin acetate (Zoladex)  07/25/13  . Wears glasses     Patient Active Problem List   Diagnosis Date Noted  . Diabetes mellitus without complication ([place]) 03/00/9233  . Malignant neoplasm of lower-outer quadrant of right breast of female, estrogen receptor positive ([facility]) 04/28/2017  . HS (hereditary spherocytosis) ([place]) 09/20/2013  . Anemia, unspecified 01/27/2013  . Syncope, vasovagal 01/17/2013    Class: Chronic  . Genital herpes 10/14/2012  . Breast cancer of lower-outer quadrant of right female breast ([place]) 09/08/2012    Past Surgical History:  Procedure Laterality Date  . BREAST LUMPECTOMY WITH SENTINEL LYMPH NODE BIOPSY Right 04/06/2013   [name]  . BREAST SURGERY     left breast lumpectomy/left ax snbx  . COLONOSCOPY  2015  . PORT-A-CATH REMOVAL N/A 04/06/2013   Procedure: REMOVAL PORT-A-CATH;  Surgeon: [name], MD;  Location: [facility];  Service: General;  Laterality: N/A;  . PORTACATH PLACEMENT Left 09/20/2012   Procedure: INSERTION PORT-A-CATH;  Surgeon: [name], MD;  Location: [facility];  Service: General;  Laterality: Left;  . PORTACATH PLACEMENT Right 05/18/2017   Procedure: INSERTION PORT-A-CATH WITH ULTRASOUND;  Surgeon: [name], MD;  Location: [facility];  Service: General;  Laterality: Right;  . WISDOM TOOTH EXTRACTION       OB History    Gravida  0   Para  0   Term  Preterm      AB      Living        SAB      TAB      Ectopic      Multiple      Live Births           Obstetric Comments   menarche at age 83 she and her last menstrual cycle was on 08/26/2012. She is nulliparous         Home Medications    Prior to Admission medications   Medication Sig Start Date End Date Taking? Authorizing Provider  cetirizine HCl (ZYRTEC) 5 MG/5ML SOLN Take 5 mLs (5 mg total) by mouth daily. 06/02/17  Yes Nicholas Lose, MD  folic acid (FOLVITE) 1 MG tablet Take 1 mg by mouth daily.   Yes [provider]  metFORMIN (GLUCOPHAGE) 500 MG tablet TAKE 1 TABLET BY MOUTH EVERY DAY WITH BREAKFAST 05/24/17  Yes Stallings, Zoe A, MD  polyethylene glycol (MIRALAX / GLYCOLAX) packet Take 17 g by mouth daily as needed for mild constipation or moderate constipation.   Yes [provider]  ondansetron (ZOFRAN) 8 MG tablet Take 1 tablet (8 mg total) by mouth every 8 (eight) hours as needed for nausea. 06/02/17   Nicholas Lose, MD  prochlorperazine (COMPAZINE) 10 MG tablet Take 1 tablet (10 mg total) by mouth every 6 (six) hours as needed for nausea or vomiting. 06/02/17   Nicholas Lose, MD    Family History Family History  Problem Relation Age of Onset  . Pulmonary embolism Maternal Uncle 59       died instantly  . Melanoma Cousin        paternal cousin dx in her 65s  . Heart disease Father   . Diabetes Father   . Thyroid disease Mother     Social History Social History   Tobacco Use  . Smoking status: Never Smoker  . Smokeless tobacco: Never Used  Substance Use Topics  . Alcohol use: No  . Drug use: No     Allergies   Patient has no known allergies.   Review of Systems Review of Systems ROS: Statement: All systems negative except as marked or noted in the HPI; Constitutional: Negative for fever and chills. +generalized fatigue.; ; Eyes: Negative for eye pain, redness and discharge. ; ; ENMT: Negative for ear pain, hoarseness, nasal congestion, sinus pressure and sore throat. ; ; Cardiovascular: Negative for chest pain, palpitations, diaphoresis, dyspnea and peripheral edema. ; ; Respiratory: Negative for cough, wheezing and stridor. ; ; Gastrointestinal: Negative for nausea, vomiting, diarrhea, abdominal pain, blood in stool, hematemesis, jaundice and rectal bleeding. . ; ; Genitourinary: Negative for dysuria, flank pain and hematuria. ; ; Musculoskeletal: Negative for back pain and neck pain. Negative for swelling and trauma.; ; Skin: Negative for pruritus, rash, abrasions, blisters,  bruising and skin lesion.; ; Neuro: Negative for headache and neck stiffness. Negative for extremity weakness, paresthesias, involuntary movement, seizure and +lightheadedness, syncope.      Physical Exam Updated Vital Signs BP (!) 88/54   Pulse 79   Temp 98.2 F (36.8 C)   Resp 16   SpO2 98%    Patient Vitals for the past 24 hrs:  BP Temp Pulse Resp SpO2  06/11/17 1130 (!) 95/48 - 79 19 95 %  06/11/17 1100 (!) 97/51 - 74 (!) 22 100 %  06/11/17 1030 93/73 - 91 20 100 %  06/11/17 0920 (!) 88/54 98.2 F (  36.8 C) 79 16 98 %    10:26 Orthostatic Vital Signs RB  Orthostatic Lying   BP- Lying: 102/53   Pulse- Lying: 77       Orthostatic Sitting  BP- Sitting: 99/56   Pulse- Sitting: 83       Orthostatic Standing at 0 minutes  BP- Standing at 0 minutes: 99/46   Pulse- Standing at 0 minutes: 77      Physical Exam 0950: Physical examination:  Nursing notes reviewed; Vital signs and O2 SAT reviewed;  Constitutional: Well developed, Well nourished, In no acute distress; Head:  Normocephalic, atraumatic; Eyes: EOMI, PERRL, No scleral icterus; ENMT: Mouth and pharynx normal, Mucous membranes dry; Neck: Supple, Full range of motion, No lymphadenopathy; Cardiovascular: Regular rate and rhythm, No gallop; Respiratory: Breath sounds clear & equal bilaterally, No wheezes. Speaking full sentences with ease, Normal respiratory effort/excursion; Chest: Nontender, Movement normal; Abdomen: Soft, Nontender, Nondistended, Normal bowel sounds; Genitourinary: No CVA tenderness; Extremities: Peripheral pulses normal, No tenderness, No edema, No calf edema or asymmetry.; Neuro: AA&Ox3, Major CN grossly intact. No facial droop.  Speech clear. No gross focal motor or sensory deficits in extremities.; Skin: Color pale, Warm, Dry.    ED Treatments / Results  Labs (all labs ordered are listed, but only abnormal results are displayed)   EKG EKG Interpretation  Date/Time:  Friday Jun 11 2017  10:15:54 EDT Ventricular Rate:  78 PR Interval:    QRS Duration: 80 QT Interval:  413 QTC Calculation: 471 R Axis:   56 Text Interpretation:  Sinus rhythm Abnormal R-wave progression, early transition When compared with ECG of 05/18/2017 No significant change was found Confirmed by Francine Graven [phone]) on 06/11/2017 10:41:24 AM   Radiology   Procedures Procedures (including critical care time)  Medications Ordered in ED Medications  sodium chloride 0.9 % bolus 1,000 mL (1,000 mLs Intravenous New Bag/Given 06/11/17 1017)     Initial Impression / Assessment and Plan / ED Course  I have reviewed the triage vital signs and the nursing notes.  Pertinent labs & imaging results that were available during my care of the patient were reviewed by me and considered in my medical decision making (see chart for details).  MDM Reviewed: previous chart, nursing note and vitals Reviewed previous: labs and ECG Interpretation: labs, ECG, x-ray, CT scan and ultrasound Total time providing critical care: 30-74 minutes. This excludes time spent performing separately reportable procedures and services. Consults: admitting MD   CRITICAL CARE Performed by: Alfonzo Feller Total critical care time: 35 minutes Critical care time was exclusive of separately billable procedures and treating other patients. Critical care was necessary to treat or prevent imminent or life-threatening deterioration. Critical care was time spent personally by me on the following activities: development of treatment plan with patient and/or surrogate as well as nursing, discussions with consultants, evaluation of patient's response to treatment, examination of patient, obtaining history from patient or surrogate, ordering and performing treatments and interventions, ordering and review of laboratory studies, ordering and review of radiographic studies, pulse oximetry and re-evaluation of patient's condition.   Results  for orders placed or performed during the hospital encounter of 06/11/17  Comprehensive metabolic panel  Result Value Ref Range   Sodium 138 135 - 145 mmol/L   Potassium 4.0 3.5 - 5.1 mmol/L   Chloride 108 101 - 111 mmol/L   CO2 22 22 - 32 mmol/L   Glucose, Bld 182 (H) 65 - 99 mg/dL   BUN 14 6 -  20 mg/dL   Creatinine, Ser 0.64 0.44 - 1.00 mg/dL   Calcium 7.7 (L) 8.9 - 10.3 mg/dL   Total Protein 6.1 (L) 6.5 - 8.1 g/dL   Albumin 3.5 3.5 - 5.0 g/dL   AST 63 (H) 15 - 41 U/L   ALT 103 (H) 14 - 54 U/L   Alkaline Phosphatase 65 38 - 126 U/L   Total Bilirubin 1.5 (H) 0.3 - 1.2 mg/dL   GFR calc non Af Amer >60 >60 mL/min   GFR calc Af Amer >60 >60 mL/min   Anion gap 8 5 - 15  Troponin I  Result Value Ref Range   Troponin I <0.03 <0.03 ng/mL  Lactic acid, plasma  Result Value Ref Range   Lactic Acid, Venous 1.1 0.5 - 1.9 mmol/L  CBC with Differential  Result Value Ref Range   WBC 3.6 (L) 4.0 - 10.5 K/uL   RBC 2.54 (L) 3.87 - 5.11 MIL/uL   Hemoglobin 7.2 (L) 12.0 - 15.0 g/dL   HCT 20.9 (L) 36.0 - 46.0 %   MCV 82.3 78.0 - 100.0 fL   MCH 28.3 26.0 - 34.0 pg   MCHC 34.4 30.0 - 36.0 g/dL   RDW 17.9 (H) 11.5 - 15.5 %   Platelets 116 (L) 150 - 400 K/uL   Neutrophils Relative % 83 %   Neutro Abs 3.0 1.7 - 7.7 K/uL   Lymphocytes Relative 12 %   Lymphs Abs 0.4 (L) 0.7 - 4.0 K/uL   Monocytes Relative 4 %   Monocytes Absolute 0.1 0.1 - 1.0 K/uL   Eosinophils Relative 1 %   Eosinophils Absolute 0.0 0.0 - 0.7 K/uL   Basophils Relative 0 %   Basophils Absolute 0.0 0.0 - 0.1 K/uL  Urinalysis, Routine w reflex microscopic  Result Value Ref Range   Color, Urine YELLOW YELLOW   APPearance CLEAR CLEAR   Specific Gravity, Urine 1.015 1.005 - 1.030   pH 5.0 5.0 - 8.0   Glucose, UA NEGATIVE NEGATIVE mg/dL   Hgb urine dipstick NEGATIVE NEGATIVE   Bilirubin Urine NEGATIVE NEGATIVE   Ketones, ur 5 (A) NEGATIVE mg/dL   Protein, ur 30 (A) NEGATIVE mg/dL   Nitrite NEGATIVE NEGATIVE   Leukocytes,  UA TRACE (A) NEGATIVE   RBC / HPF 0-5 0 - 5 RBC/hpf   WBC, UA 0-5 0 - 5 WBC/hpf   Bacteria, UA NONE SEEN NONE SEEN   Squamous Epithelial / LPF 0-5 0 - 5   Mucus PRESENT    Hyaline Casts, UA PRESENT   Sample to Blood Bank  Result Value Ref Range   Blood Bank Specimen SAMPLE AVAILABLE FOR TESTING    Sample Expiration      06/14/2017 Performed at [facility], [place] [address] [place], [place] [address]    Dg Chest 2 View Result Date: 06/11/2017 CLINICAL DATA:  Syncope today, breast cancer, chemotherapy 2 days ago, diabetes mellitus, history spherocytosis EXAM: CHEST - 2 VIEW COMPARISON:  05/18/2017 FINDINGS: RIGHT jugular Port-A-Cath with tip projecting over SVC. Enlargement of cardiac silhouette. Mediastinal contours and pulmonary vascularity normal. Lungs clear. No pleural effusion or pneumothorax. Bones unremarkable. IMPRESSION: Enlargement of cardiac silhouette without acute abnormalities. Electronically Signed   By: Lavonia Dana M.D.   On: 06/11/2017 10:25   Ct Head Wo Contrast Result Date: 06/11/2017 CLINICAL DATA:  46 year old female with altered level of consciousness and syncope. History of breast cancer, on chemotherapy EXAM: CT HEAD WITHOUT CONTRAST TECHNIQUE: Contiguous axial images were obtained  from the base of the skull through the vertex without intravenous contrast. COMPARISON:  05/04/2017 PET CT FINDINGS: Brain: No evidence of acute infarction, hemorrhage, hydrocephalus, extra-axial collection or mass lesion/mass effect. Mild generalized volume loss noted. Vascular: No hyperdense vessel or unexpected calcification. Skull: Normal. Negative for fracture or focal lesion. Sinuses/Orbits: No acute finding. Other: None. IMPRESSION: No evidence of acute intracranial abnormality. Electronically Signed   By: Margarette Canada M.D.   On: 06/11/2017 11:16   US Abdomen Complete Result Date: 06/11/2017 CLINICAL DATA:  Elevated liver function tests. EXAM: ABDOMEN ULTRASOUND  COMPLETE COMPARISON:  PET CT scan 05/04/2016. FINDINGS: Gallbladder: A few tiny stones are seen layering dependently. No wall thickening or pericholecystic fluid. Sonographer reports negative Murphy's sign. Common bile duct: Diameter: 0.3 cm Liver: Echogenicity is diffusely increased consistent with fatty infiltration. No focal lesion. Portal vein is patent on color Doppler imaging with normal direction of blood flow towards the liver. IVC: No abnormality visualized. Pancreas: Visualized portion unremarkable. Spleen: No focal lesion. Splenic volume is 802 cc. Upper limits of normal is 411 cc. Right Kidney: Length: 12.0 cm. Echogenicity within normal limits. No mass or hydronephrosis visualized. Left Kidney: Length: 12.2 cm. Echogenicity within normal limits. 0.9 cm cyst upper pole incidentally noted. No hydronephrosis visualized. Abdominal aorta: No aneurysm visualized. Other findings: None. IMPRESSION: No acute abnormality. Fatty infiltration of the liver. A few tiny gallstones are present.  No cholecystitis. Electronically Signed   By: Inge Rise M.D.   On: 06/11/2017 12:41    Results for SYONA, WROBLEWSKI (MRN [id number]) as of 06/11/2017 12:44  Ref. Range 05/01/2017 09:07 05/18/2017 11:00 06/02/2017 08:01 06/09/2017 08:09 06/11/2017 10:05  Hemoglobin Latest Ref Range: 12.0 - 15.0 g/dL 10.8 (A) 9.2 (L) 9.7 (L) 7.4 (L) 7.2 (L)  HCT Latest Ref Range: 36.0 - 46.0 % 30.4 (A) 27.3 (L) 27.8 (L) 21.6 (L) 20.9 (L)  Platelets Latest Ref Range: 150 - 400 K/uL  239 192 144 (L) 116 (L)    1255:  Hypotensive on arrival; IVF boluses continue with slow improvement. H/H lower than previous; PRBC's transfusion ordered. T/C returned from [facility] Dr. Verlon Au, case discussed, including:  HPI, pertinent PM/SHx, VS/PE, dx testing, ED course and treatment:  Agreeable to admit, requests to call Onc MD. T/C returned from [facility] Dr. Sonny Dandy, case discussed, including:  HPI, pertinent PM/SHx, VS/PE, dx testing, ED course and treatment:   Agreeable to consult.     Final Clinical Impressions(s) / ED Diagnoses   Final diagnoses:  None    ED Discharge Orders    None       Francine Graven, DO 06/13/17 1311

## 2017-06-11 NOTE — ED Notes (Signed)
Bed: JE83 Expected date:  Expected time:  Means of arrival:  Comments: Ems hypotension

## 2017-06-11 NOTE — ED Triage Notes (Signed)
EMS reports from home did not feel well since yesterday, c/o fatigue, near syncopal episode, family member called EMS. Breast Cancer pt, rec'd chemo Wednesday. Had Blood transfusion due to low HGB Wednesday as well  BP 60/40 Post bolus 93/53 HR 50 Post bolus 74 Resp 16 Sp02 98 RA CBG 176  20ga LForearm  1064ml NS enroute

## 2017-06-12 DIAGNOSIS — R55 Syncope and collapse: Secondary | ICD-10-CM | POA: Diagnosis not present

## 2017-06-12 LAB — COMPREHENSIVE METABOLIC PANEL
ALBUMIN: 3.3 g/dL — AB (ref 3.5–5.0)
ALT: 93 U/L — ABNORMAL HIGH (ref 14–54)
ANION GAP: 8 (ref 5–15)
AST: 46 U/L — ABNORMAL HIGH (ref 15–41)
Alkaline Phosphatase: 66 U/L (ref 38–126)
BILIRUBIN TOTAL: 1.4 mg/dL — AB (ref 0.3–1.2)
BUN: 9 mg/dL (ref 6–20)
CHLORIDE: 108 mmol/L (ref 101–111)
CO2: 22 mmol/L (ref 22–32)
Calcium: 8.3 mg/dL — ABNORMAL LOW (ref 8.9–10.3)
Creatinine, Ser: 0.46 mg/dL (ref 0.44–1.00)
GFR calc Af Amer: >60 mL/min (ref >60)
GLUCOSE: 153 mg/dL — AB (ref 65–99)
POTASSIUM: 3.8 mmol/L (ref 3.5–5.1)
Sodium: 138 mmol/L (ref 135–145)
TOTAL PROTEIN: 6.4 g/dL — AB (ref 6.5–8.1)

## 2017-06-12 LAB — CBC
HCT: 24.6 % — ABNORMAL LOW (ref 36.0–46.0)
Hemoglobin: 8.5 g/dL — ABNORMAL LOW (ref 12.0–15.0)
MCH: 28.6 pg (ref 26.0–34.0)
MCHC: 34.6 g/dL (ref 30.0–36.0)
MCV: 82.8 fL (ref 78.0–100.0)
PLATELETS: 111 10*3/uL — AB (ref 150–400)
RBC: 2.97 MIL/uL — AB (ref 3.87–5.11)
RDW: 18 % — ABNORMAL HIGH (ref 11.5–15.5)
WBC: 3.1 10*3/uL — ABNORMAL LOW (ref 4.0–10.5)

## 2017-06-12 LAB — URINE CULTURE: Culture: NO GROWTH

## 2017-06-12 LAB — PROTIME-INR
INR: 1.13
Prothrombin Time: 14.4 seconds (ref 11.4–15.2)

## 2017-06-12 MED ORDER — SODIUM CHLORIDE 0.9% FLUSH: 10.0000 mL | INTRAVENOUS | Status: DC | PRN

## 2017-06-12 MED ORDER — HEPARIN SOD (PORK) LOCK FLUSH 100 UNIT/ML IV SOLN
500.0000 [IU] | INTRAVENOUS | Status: AC | PRN
  Administered 2017-06-12: 500 [IU]

## 2017-06-12 NOTE — Discharge Summary (Signed)
Physician Discharge Summary  Mary Dougherty BTD:[id number] DOB: 28-Apr-1970 DOA: 06/11/2017  PCP: Forrest Moron, MD  Admit date: 06/11/2017 Discharge date: 06/12/2017  Time spent: > 35  minutes  Recommendations for Outpatient Follow-up:  1. Monitor hemoglobin levels 2. Ensure patient follows up with Oncologist after hospital discharge.   Discharge Diagnoses:  Active Problems:   Syncope   Anemia   Discharge Condition: carb modified.  Diet recommendation: regular diet  Filed Weights   06/11/17 1514  Weight: 86.6 kg (191 lb)    History of present illness:  36 known history right breast cancer S/B XRT, hereditary spherocytosis, repeat blood transfusions, DM TY 2, chronic anemia-under care of Dr. Lindi Adie  Has been on neoadjuvant carboplatin gemcitabine and continued antiestrogen and currently is on cycle 1 of carboplatinum and gemcitabine which she started on 5/1--day 8 chemotherapy 5/8   Baseline hemoglobin seems to be in the 9-10 range as of   Presented with syncope after exerting herself at her house with low hemoglobin of 7.2  Hospital Course:  Syncope - resolved after blood transfusion. Pt has been ambulating with no problems - recommend she follow up with her oncologist on hospital discharge. - transfused with most recent hgb at 8.5  Otherwise continue prior to admission medication regimen for known medical problems  Procedures:  none  Consultations:  None  Discharge Exam: Vitals:   06/11/17 2138 06/12/17 0631  BP: 125/69 (!) 113/58  Pulse: 83 75  Resp: 18 18  Temp: 98.2 F (36.8 C) 98.3 F (36.8 C)  SpO2: 98% 98%    General: Pt in nad, alert and awake Cardiovascular: rrr, no rubs Respiratory: no increased wob, no wheezes  Discharge Instructions   Discharge Instructions    Call MD for:  extreme fatigue   Complete by:  As directed    Call MD for:  temperature >100.4   Complete by:  As directed    Diet - low sodium heart healthy    Complete by:  As directed    Discharge instructions   Complete by:  As directed    Please follow up with your heme/onc specialist within the next week after hospital discharge.   Increase activity slowly   Complete by:  As directed      Allergies as of 06/12/2017   No Known Allergies     Medication List    TAKE these medications   cetirizine HCl 5 MG/5ML Soln Commonly known as:  Zyrtec Take 5 mLs (5 mg total) by mouth daily.   folic acid 1 MG tablet Commonly known as:  FOLVITE Take 1 mg by mouth daily.   metFORMIN 500 MG tablet Commonly known as:  GLUCOPHAGE TAKE 1 TABLET BY MOUTH EVERY DAY WITH BREAKFAST   ondansetron 8 MG tablet Commonly known as:  ZOFRAN Take 1 tablet (8 mg total) by mouth every 8 (eight) hours as needed for nausea.   polyethylene glycol packet Commonly known as:  MIRALAX / GLYCOLAX Take 17 g by mouth daily as needed for mild constipation or moderate constipation.   prochlorperazine 10 MG tablet Commonly known as:  COMPAZINE Take 1 tablet (10 mg total) by mouth every 6 (six) hours as needed for nausea or vomiting.      No Known Allergies    The results of significant diagnostics from this hospitalization (including imaging, microbiology, ancillary and laboratory) are listed below for reference.    Significant Diagnostic Studies: Dg Chest 2 View  Result Date: 06/11/2017 CLINICAL DATA:  Syncope today, breast cancer, chemotherapy 2 days ago, diabetes mellitus, history spherocytosis EXAM: CHEST - 2 VIEW COMPARISON:  05/18/2017 FINDINGS: RIGHT jugular Port-A-Cath with tip projecting over SVC. Enlargement of cardiac silhouette. Mediastinal contours and pulmonary vascularity normal. Lungs clear. No pleural effusion or pneumothorax. Bones unremarkable. IMPRESSION: Enlargement of cardiac silhouette without acute abnormalities. Electronically Signed   By: Lavonia Dana M.D.   On: 06/11/2017 10:25   Ct Head Wo Contrast  Result Date: 06/11/2017 CLINICAL  DATA:  47 year old female with altered level of consciousness and syncope. History of breast cancer, on chemotherapy EXAM: CT HEAD WITHOUT CONTRAST TECHNIQUE: Contiguous axial images were obtained from the base of the skull through the vertex without intravenous contrast. COMPARISON:  05/04/2017 PET CT FINDINGS: Brain: No evidence of acute infarction, hemorrhage, hydrocephalus, extra-axial collection or mass lesion/mass effect. Mild generalized volume loss noted. Vascular: No hyperdense vessel or unexpected calcification. Skull: Normal. Negative for fracture or focal lesion. Sinuses/Orbits: No acute finding. Other: None. IMPRESSION: No evidence of acute intracranial abnormality. Electronically Signed   By: Margarette Canada M.D.   On: 06/11/2017 11:16   US Abdomen Complete  Result Date: 06/11/2017 CLINICAL DATA:  Elevated liver function tests. EXAM: ABDOMEN ULTRASOUND COMPLETE COMPARISON:  PET CT scan 05/04/2016. FINDINGS: Gallbladder: A few tiny stones are seen layering dependently. No wall thickening or pericholecystic fluid. Sonographer reports negative Murphy's sign. Common bile duct: Diameter: 0.3 cm Liver: Echogenicity is diffusely increased consistent with fatty infiltration. No focal lesion. Portal vein is patent on color Doppler imaging with normal direction of blood flow towards the liver. IVC: No abnormality visualized. Pancreas: Visualized portion unremarkable. Spleen: No focal lesion. Splenic volume is 802 cc. Upper limits of normal is 411 cc. Right Kidney: Length: 12.0 cm. Echogenicity within normal limits. No mass or hydronephrosis visualized. Left Kidney: Length: 12.2 cm. Echogenicity within normal limits. 0.9 cm cyst upper pole incidentally noted. No hydronephrosis visualized. Abdominal aorta: No aneurysm visualized. Other findings: None. IMPRESSION: No acute abnormality. Fatty infiltration of the liver. A few tiny gallstones are present.  No cholecystitis. Electronically Signed   By: Inge Rise  M.D.   On: 06/11/2017 12:41   Dg Chest Port 1 View  Result Date: 05/18/2017 CLINICAL DATA:  47 year old female with a history of port catheter placement EXAM: PORTABLE CHEST 1 VIEW COMPARISON:  Chest x-ray 09/20/2012 FINDINGS: Cardiomediastinal silhouette within normal limits, unchanged in size and contour. No pneumothorax.  No pleural effusion or confluent airspace disease. Interval removal of left subclavian port catheter with placement of right IJ port catheter with the tip appearing to terminate superior vena cava. Unremarkable musculoskeletal tissues. IMPRESSION: Interval removal of left subclavian port catheter and placement of right IJ port catheter without complicating features. Electronically Signed   By: Corrie Mckusick D.O.   On: 05/18/2017 13:20   Dg Fluoro Guide Cv Line-no Report  Result Date: 05/18/2017 Fluoroscopy was utilized by the requesting physician.  No radiographic interpretation.    Microbiology: Recent Results (from the past 240 hour(s))  Urine culture     Status: None   Collection Time: 06/11/17 11:51 AM  Result Value Ref Range Status   Specimen Description   Final    URINE, RANDOM Performed at [facility] [address] [place], [place] [address]    Special Requests   Final    NONE Performed at [facility], [place] [address] [place], [place] [address]    Culture   Final    NO GROWTH Performed  at [facility], [place] [address], [place],  [address]    Report Status 06/12/2017 FINAL  Final     Labs: Basic Metabolic Panel: Recent Labs  Lab 06/09/17 0809 06/11/17 1005 06/12/17 0800  NA 140 138 138  K 4.0 4.0 3.8  CL 108 108 108  CO2 27 22 22   GLUCOSE 159* 182* 153*  BUN 12 14 9   CREATININE 0.67 0.64 0.46  CALCIUM 9.1 7.7* 8.3*   Liver Function Tests: Recent Labs  Lab 06/09/17 0809 06/11/17 1005 06/12/17 0800  AST 30 63* 46*  ALT 64* 103* 93*  ALKPHOS 94 65 66  BILITOT 1.2 1.5* 1.4*  PROT 6.8  6.1* 6.4*  ALBUMIN 3.8 3.5 3.3*   No results for input(s): LIPASE, AMYLASE in the last 168 hours. No results for input(s): AMMONIA in the last 168 hours. CBC: Recent Labs  Lab 06/09/17 0809 06/11/17 1005 06/11/17 2051 06/12/17 0800  WBC 5.0 3.6* 4.2 3.1*  NEUTROABS 2.9 3.0  --   --   HGB 7.4* 7.2* 8.4* 8.5*  HCT 21.6* 20.9* 25.1* 24.6*  MCV 81.2 82.3 82.8 82.8  PLT 144* 116* 120* 111*   Cardiac Enzymes: Recent Labs  Lab 06/11/17 1005  TROPONINI <0.03   BNP: BNP (last 3 results) No results for input(s): BNP in the last 8760 hours.  ProBNP (last 3 results) No results for input(s): PROBNP in the last 8760 hours.  CBG: No results for input(s): GLUCAP in the last 168 hours.   Signed:  Velvet Bathe MD.  [facility] 06/12/2017, 11:48 AM

## 2017-06-15 LAB — TYPE AND SCREEN
ABO/RH(D): B POS
Antibody Screen: NEGATIVE
UNIT DIVISION: 0
Unit division: 0

## 2017-06-15 LAB — BPAM RBC
BLOOD PRODUCT EXPIRATION DATE: 201906112359
Blood Product Expiration Date: 201906112359
ISSUE DATE / TIME: 201905101559
UNIT TYPE AND RH: 7300
Unit Type and Rh: 7300

## 2017-06-17 ENCOUNTER — Ambulatory Visit: Admit: 2017-06-17 | Payer: BLUE CROSS/BLUE SHIELD | Admitting: Obstetrics and Gynecology

## 2017-06-17 SURGERY — HYSTERECTOMY, TOTAL, ROBOT-ASSISTED, LAPAROSCOPIC, WITH BILATERAL SALPINGO-OOPHORECTOMY
Anesthesia: General | Laterality: Bilateral

## 2017-06-18 ENCOUNTER — Other Ambulatory Visit: Payer: Self-pay | Admitting: Family Medicine

## 2017-06-18 ENCOUNTER — Ambulatory Visit: Payer: BLUE CROSS/BLUE SHIELD

## 2017-06-18 DIAGNOSIS — E119 Type 2 diabetes mellitus without complications: Secondary | ICD-10-CM

## 2017-06-23 ENCOUNTER — Inpatient Hospital Stay: Payer: BLUE CROSS/BLUE SHIELD | Admitting: Hematology and Oncology

## 2017-06-23 ENCOUNTER — Inpatient Hospital Stay: Payer: BLUE CROSS/BLUE SHIELD

## 2017-06-23 VITALS — BP 120/71 | HR 73 | Temp 97.8°F | Resp 18 | Ht 65.0 in | Wt 189.0 lb

## 2017-06-23 DIAGNOSIS — D649 Anemia, unspecified: Secondary | ICD-10-CM

## 2017-06-23 DIAGNOSIS — E119 Type 2 diabetes mellitus without complications: Secondary | ICD-10-CM

## 2017-06-23 DIAGNOSIS — Z170 Estrogen receptor positive status [ER+]: Secondary | ICD-10-CM

## 2017-06-23 DIAGNOSIS — D580 Hereditary spherocytosis: Secondary | ICD-10-CM | POA: Diagnosis not present

## 2017-06-23 DIAGNOSIS — K5900 Constipation, unspecified: Secondary | ICD-10-CM

## 2017-06-23 DIAGNOSIS — C50511 Malignant neoplasm of lower-outer quadrant of right female breast: Secondary | ICD-10-CM

## 2017-06-23 DIAGNOSIS — Z5111 Encounter for antineoplastic chemotherapy: Secondary | ICD-10-CM | POA: Diagnosis not present

## 2017-06-23 DIAGNOSIS — R945 Abnormal results of liver function studies: Secondary | ICD-10-CM

## 2017-06-23 DIAGNOSIS — Z95828 Presence of other vascular implants and grafts: Secondary | ICD-10-CM

## 2017-06-23 LAB — CBC WITH DIFFERENTIAL (CANCER CENTER ONLY)
Basophils Absolute: 0 10*3/uL (ref 0.0–0.1)
Basophils Relative: 1 %
Eosinophils Absolute: 0 10*3/uL (ref 0.0–0.5)
Eosinophils Relative: 1 %
HCT: 28.1 % — ABNORMAL LOW (ref 34.8–46.6)
Hemoglobin: 9.8 g/dL — ABNORMAL LOW (ref 11.6–15.9)
Lymphocytes Relative: 27 %
Lymphs Abs: 1.5 10*3/uL (ref 0.9–3.3)
MCH: 30.3 pg (ref 25.1–34.0)
MCHC: 34.9 g/dL (ref 31.5–36.0)
MCV: 86.9 fL (ref 79.5–101.0)
Monocytes Absolute: 0.4 10*3/uL (ref 0.1–0.9)
Monocytes Relative: 7 %
Neutro Abs: 3.5 10*3/uL (ref 1.5–6.5)
Neutrophils Relative %: 64 %
Platelet Count: 168 10*3/uL (ref 145–400)
RBC: 3.24 MIL/uL — ABNORMAL LOW (ref 3.70–5.45)
RDW: 26.6 % — ABNORMAL HIGH (ref 11.2–14.5)
WBC Count: 5.4 10*3/uL (ref 3.9–10.3)

## 2017-06-23 LAB — CMP (CANCER CENTER ONLY)
ALBUMIN: 3.8 g/dL (ref 3.5–5.0)
ALT: 42 U/L (ref 0–55)
AST: 22 U/L (ref 5–34)
Alkaline Phosphatase: 117 U/L (ref 40–150)
Anion gap: 8 (ref 3–11)
BUN: 9 mg/dL (ref 7–26)
CHLORIDE: 107 mmol/L (ref 98–109)
CO2: 27 mmol/L (ref 22–29)
CREATININE: 0.66 mg/dL (ref 0.60–1.10)
Calcium: 8.8 mg/dL (ref 8.4–10.4)
GFR, Est AFR Am: >60 mL/min (ref >60)
GFR, Estimated: >60 mL/min (ref >60)
Glucose, Bld: 162 mg/dL — ABNORMAL HIGH (ref 70–140)
POTASSIUM: 4.1 mmol/L (ref 3.5–5.1)
SODIUM: 142 mmol/L (ref 136–145)
Total Bilirubin: 1.5 mg/dL — ABNORMAL HIGH (ref 0.2–1.2)
Total Protein: 6.8 g/dL (ref 6.4–8.3)

## 2017-06-23 MED ORDER — PALONOSETRON HCL INJECTION 0.25 MG/5ML
INTRAVENOUS | Status: AC
  Filled 2017-06-23: qty 5

## 2017-06-23 MED ORDER — SODIUM CHLORIDE 0.9 % IV SOLN
1000.0000 mg/m2 | Freq: Once | INTRAVENOUS | Status: AC
  Administered 2017-06-23: 2014 mg via INTRAVENOUS
  Filled 2017-06-23: qty 52.97

## 2017-06-23 MED ORDER — PALONOSETRON HCL INJECTION 0.25 MG/5ML
0.2500 mg | Freq: Once | INTRAVENOUS | Status: AC
  Administered 2017-06-23: 0.25 mg via INTRAVENOUS

## 2017-06-23 MED ORDER — SODIUM CHLORIDE 0.9% FLUSH
10.0000 mL | INTRAVENOUS | Status: DC | PRN
  Administered 2017-06-23: 10 mL
  Filled 2017-06-23: qty 10

## 2017-06-23 MED ORDER — HEPARIN SOD (PORK) LOCK FLUSH 100 UNIT/ML IV SOLN
500.0000 [IU] | Freq: Once | INTRAVENOUS | Status: AC | PRN
  Administered 2017-06-23: 500 [IU]
  Filled 2017-06-23: qty 5

## 2017-06-23 MED ORDER — SODIUM CHLORIDE 0.9 % IV SOLN
Freq: Once | INTRAVENOUS | Status: AC
  Administered 2017-06-23: 11:00:00 via INTRAVENOUS

## 2017-06-23 MED ORDER — HEPARIN SOD (PORK) LOCK FLUSH 100 UNIT/ML IV SOLN
500.0000 [IU] | Freq: Once | INTRAVENOUS | Status: DC
  Filled 2017-06-23: qty 5

## 2017-06-23 MED ORDER — ALTEPLASE 2 MG IJ SOLR
2.0000 mg | Freq: Once | INTRAMUSCULAR | Status: DC | PRN
  Filled 2017-06-23: qty 2

## 2017-06-23 MED ORDER — GOSERELIN ACETATE 10.8 MG ~~LOC~~ IMPL
10.8000 mg | DRUG_IMPLANT | Freq: Once | SUBCUTANEOUS | Status: AC
  Administered 2017-06-23: 10.8 mg via SUBCUTANEOUS
  Filled 2017-06-23: qty 10.8

## 2017-06-23 MED ORDER — SODIUM CHLORIDE 0.9% FLUSH
10.0000 mL | INTRAVENOUS | Status: DC | PRN
  Administered 2017-06-23: 10 mL via INTRAVENOUS
  Filled 2017-06-23: qty 10

## 2017-06-23 MED ORDER — SODIUM CHLORIDE 0.9 % IV SOLN
289.6000 mg | Freq: Once | INTRAVENOUS | Status: AC
  Administered 2017-06-23: 290 mg via INTRAVENOUS
  Filled 2017-06-23: qty 29

## 2017-06-23 NOTE — Patient Instructions (Signed)
[facility] Discharge Instructions for Patients Receiving Chemotherapy  Today you received the following chemotherapy agents Gemzar and Carboplatin   To help prevent nausea and vomiting after your treatment, we encourage you to take your nausea medication as directed.    If you develop nausea and vomiting that is not controlled by your nausea medication, call the clinic.   BELOW ARE SYMPTOMS THAT SHOULD BE REPORTED IMMEDIATELY:  *FEVER GREATER THAN 100.5 F  *CHILLS WITH OR WITHOUT FEVER  NAUSEA AND VOMITING THAT IS NOT CONTROLLED WITH YOUR NAUSEA MEDICATION  *UNUSUAL SHORTNESS OF BREATH  *UNUSUAL BRUISING OR BLEEDING  TENDERNESS IN MOUTH AND THROAT WITH OR WITHOUT PRESENCE OF ULCERS  *URINARY PROBLEMS  *BOWEL PROBLEMS  UNUSUAL RASH Items with * indicate a potential emergency and should be followed up as soon as possible.  Feel free to call the clinic should you have any questions or concerns. The clinic phone number is [phone].  Please show the CHEMO ALERT CARD at check-in to the Emergency Department and triage nurse.   

## 2017-06-23 NOTE — Progress Notes (Signed)
[facility] Follow up:    Mary Moron, MD [address] [place] [address]   DIAGNOSIS: Cancer Staging Breast cancer of lower-outer quadrant of right female breast [facility]) Staging form: Breast, AJCC 7th Edition - Clinical: No stage assigned - Unsigned - Pathologic: Stage IIA (T3, N1, cM0) - Unsigned Staging comments: EP+PR+ Her2Neu-    SUMMARY OF ONCOLOGIC HISTORY:   Breast cancer of lower-outer quadrant of right female breast ([place])   09/08/2012 Initial Diagnosis    Breast cancer of lower-outer quadrant of right female breast: ER 99% PR 100% HER-2 negative, Ki-67 70%      10/07/2012 - 03/17/2013 Neo-Adjuvant Chemotherapy    Dose dense FEC followed by weekly Taxol      04/06/2013 Surgery    Right breast lumpectomy residual 1.1 cm IDC grade 3 with high-grade DCIS, PNI +ve, 1/2 SLN positive, no MVI, T1 C. N1 A. stage IIA, ER 97% PR 95% HER-2 1.1 ratio negative      05/08/2013 - 06/22/2013 Radiation Therapy    Radiation therapy to lumpectomy site      07/03/2013 - 04/27/2017 Anti-estrogen oral therapy    Zoladex plus Aromasin       Procedure    Genetic testing:PTEN C.-([id number])del 26 on [employer] testing      04/15/2017 Relapse/Recurrence    Right breast biopsy: Invasive ductal carcinoma, grade 3, ER 10%, PR 0%, Ki-67 30%, HER-2 equivocal, ratio 1.36, copy #5.2, IHC 1+ negative      04/22/2017 Breast MRI    Malignancy LOQ right breast 2.3 cm within the lumpectomy scar, left breast normal       Malignant neoplasm of lower-outer quadrant of right breast of female, estrogen receptor positive ([place])   04/28/2017 Initial Diagnosis    Malignant neoplasm of lower-outer quadrant of right breast of female, estrogen receptor positive ([place])      04/28/2017 -  Chemotherapy    The patient had palonosetron (ALOXI) injection 0.25 mg, 0.25 mg, Intravenous,  Once, 1 of 6 cycles Administration: 0.25 mg (06/02/2017), 0.25 mg (06/09/2017) CARBOplatin (PARAPLATIN) 290 mg in  sodium chloride 0.9 % 250 mL chemo infusion, 290 mg (100 % of original dose 289.6 mg), Intravenous,  Once, 1 of 6 cycles Dose modification:   (original dose 289.6 mg, Cycle 1) Administration: 290 mg (06/02/2017), 290 mg (06/09/2017) gemcitabine (GEMZAR) 2,014 mg in sodium chloride 0.9 % 250 mL chemo infusion, 1,000 mg/m2 = 2,014 mg, Intravenous,  Once, 1 of 6 cycles Administration: 2,014 mg (06/02/2017), 2,014 mg (06/09/2017)  for chemotherapy treatment.        CURRENT THERAPY: Gemcitabine/Carboplatin cycle 2 day 1  INTERVAL HISTORY: Mary Dougherty 47 y.o. female returns for cycle 2 of carboplatin and gemcitabine.  She appears to have tolerated cycle 1 fairly well.  She did have constipation for which she is using MiraLAX.  Did not have any nausea vomiting.  She has not taken any of her nausea medications.  She has constipation for which she takes MiraLAX intermittently.  Denies any mouth sores.  Denies any fevers or chills.   Patient Active Problem List   Diagnosis Date Noted  . Syncope 06/11/2017  . Anemia 06/11/2017  . Diabetes mellitus without complication ([place]) 15/17/6160  . Malignant neoplasm of lower-outer quadrant of right breast of female, estrogen receptor positive ([facility]) 04/28/2017  . HS (hereditary spherocytosis) ([place]) 09/20/2013  . Anemia, unspecified 01/27/2013  . Syncope, vasovagal 01/17/2013    Class: Chronic  . Genital herpes 10/14/2012  .  Breast cancer of lower-outer quadrant of right female breast ([facility]) 09/08/2012    has No Known Allergies.  MEDICAL HISTORY: Past Medical History:  Diagnosis Date  . Anemia   . Blood transfusion without reported diagnosis   . Chronic anemia 03/03/13  . Diabetes mellitus without complication ([place])    diabetes II  . Dyspnea    with exertion  . Family history of adverse reaction to anesthesia     father- hard to awaken  . Hereditary spherocytosis ([place])   . Invasive ductal carcinoma of right breast ([place]) 09/2012   ER(99%), PR(100%),  Ki-67(70%), 1/5 nodes positive  . S/P radiation therapy 05/08/2013-06/22/2013   1) Right breast / 50.4 Gy in 28 fractions / 2) Right Supraclavicular fossa/ 50.4 Gy in 28 fractions /3) Right Posterior Axillary boost / 11.9 Gy in 28 fractions /4) Right breast boost / 10 Gy in 5 fractions   . Spherocytosis ([place]) 1989  . Status post chemotherapy 10/07/12 - 12/16/12    Neoadjuvant chemotherapy with dose dense FEC (5-FU/epirubicin/Cytoxan) from 10/07/2012 through 12/16/12.    Marland Kitchen Syncope and collapse 1989   "thats when I found out I have Spherocytosis"  . Thyroid disease   . Use of exemestane (Aromasin) 07/25/13  . Use of goserelin acetate (Zoladex) 07/25/13  . Wears glasses     SURGICAL HISTORY: Past Surgical History:  Procedure Laterality Date  . BREAST LUMPECTOMY WITH SENTINEL LYMPH NODE BIOPSY Right 04/06/2013   [name]  . BREAST SURGERY     left breast lumpectomy/left ax snbx  . COLONOSCOPY  2015  . PORT-A-CATH REMOVAL N/A 04/06/2013   Procedure: REMOVAL PORT-A-CATH;  Surgeon: [name], MD;  Location: [facility];  Service: General;  Laterality: N/A;  . PORTACATH PLACEMENT Left 09/20/2012   Procedure: INSERTION PORT-A-CATH;  Surgeon: [name], MD;  Location: [facility];  Service: General;  Laterality: Left;  . PORTACATH PLACEMENT Right 05/18/2017   Procedure: INSERTION PORT-A-CATH WITH ULTRASOUND;  Surgeon: [name], MD;  Location: [facility];  Service: General;  Laterality: Right;  . WISDOM TOOTH EXTRACTION      SOCIAL HISTORY: Social History   Socioeconomic History  . Marital status: Single    Spouse name: Not on file  . Number of children: Not on file  . Years of education: Not on file  . Highest education level: Not on file  Occupational History  . Not on file  Social Needs  . Financial resource strain: Not on file  . Food insecurity:    Worry: Not on file    Inability: Not on file  . Transportation needs:    Medical:  Not on file    Non-medical: Not on file  Tobacco Use  . Smoking status: Never Smoker  . Smokeless tobacco: Never Used  Substance and Sexual Activity  . Alcohol use: No  . Drug use: No  . Sexual activity: Yes    Birth control/protection: Abstinence    Comment: menarche age 66, P54,  last menstrual cycle 08/26/2012  Lifestyle  . Physical activity:    Days per week: Not on file    Minutes per session: Not on file  . Stress: Not on file  Relationships  . Social connections:    Talks on phone: Not on file    Gets together: Not on file    Attends religious service: Not on file    Active member of club or organization: Not on file    Attends meetings of clubs  or organizations: Not on file    Relationship status: Not on file  . Intimate partner violence:    Fear of current or ex partner: Not on file    Emotionally abused: Not on file    Physically abused: Not on file    Forced sexual activity: Not on file  Other Topics Concern  . Not on file  Social History Narrative  . Not on file    FAMILY HISTORY: Family History  Problem Relation Age of Onset  . Pulmonary embolism Maternal Uncle 58       died instantly  . Melanoma Cousin        paternal cousin dx in her 106s  . Heart disease Father   . Diabetes Father   . Thyroid disease Mother     Review of Systems - Oncology    PHYSICAL EXAMINATION  ECOG PERFORMANCE STATUS: 1 - Symptomatic but completely ambulatory  Vitals:   06/23/17 1028  BP: 120/71  Pulse: 73  Resp: 18  Temp: 97.8 F (36.6 C)  SpO2: 100%    Physical Exam  HEENT: No ulcers Lungs: Clear Heart: S1-S2 normal  abdomen soft nontender nondistended Extremities no edema Neurologic exam grossly intact  LABORATORY DATA:  CBC    Component Value Date/Time   WBC 5.4 06/23/2017 0935   WBC 3.1 (L) 06/12/2017 0800   RBC 3.24 (L) 06/23/2017 0935   HGB 9.8 (L) 06/23/2017 0935   HGB 11.0 (L) 03/08/2015 1544   HCT 28.1 (L) 06/23/2017 0935   HCT 31.6 (L)  03/08/2015 1544   PLT 168 06/23/2017 0935   PLT 220 03/08/2015 1544   MCV 86.9 06/23/2017 0935   MCV 80.7 05/01/2017 0907   MCV 83.4 03/08/2015 1544   MCH 30.3 06/23/2017 0935   MCHC 34.9 06/23/2017 0935   RDW 26.6 (H) 06/23/2017 0935   RDW 18.7 (H) 03/08/2015 1544   LYMPHSABS 1.5 06/23/2017 0935   LYMPHSABS 2.2 03/08/2015 1544   MONOABS 0.4 06/23/2017 0935   MONOABS 0.6 03/08/2015 1544   EOSABS 0.0 06/23/2017 0935   EOSABS 0.2 03/08/2015 1544   BASOSABS 0.0 06/23/2017 0935   BASOSABS 0.1 03/08/2015 1544    CMP     Component Value Date/Time   NA 138 06/12/2017 0800   NA 137 05/01/2017 0922   NA 141 03/08/2015 1544   K 3.8 06/12/2017 0800   K 4.2 03/08/2015 1544   CL 108 06/12/2017 0800   CO2 22 06/12/2017 0800   CO2 26 03/08/2015 1544   GLUCOSE 153 (H) 06/12/2017 0800   GLUCOSE 189 (H) 03/08/2015 1544   BUN 9 06/12/2017 0800   BUN 9 05/01/2017 0922   BUN 12.6 03/08/2015 1544   CREATININE 0.46 06/12/2017 0800   CREATININE 0.67 06/09/2017 0809   CREATININE 0.8 03/08/2015 1544   CALCIUM 8.3 (L) 06/12/2017 0800   CALCIUM 9.3 03/08/2015 1544   PROT 6.4 (L) 06/12/2017 0800   PROT 7.1 05/01/2017 0922   PROT 7.8 03/08/2015 1544   ALBUMIN 3.3 (L) 06/12/2017 0800   ALBUMIN 4.2 05/01/2017 0922   ALBUMIN 4.0 03/08/2015 1544   AST 46 (H) 06/12/2017 0800   AST 30 06/09/2017 0809   AST 14 03/08/2015 1544   ALT 93 (H) 06/12/2017 0800   ALT 64 (H) 06/09/2017 0809   ALT 19 03/08/2015 1544   ALKPHOS 66 06/12/2017 0800   ALKPHOS 112 03/08/2015 1544   BILITOT 1.4 (H) 06/12/2017 0800   BILITOT 1.2 06/09/2017 0809   BILITOT 1.45 (  H) 03/08/2015 1544   GFRNONAA >60 06/12/2017 0800   GFRNONAA >60 06/09/2017 0809   GFRNONAA >89 01/25/2015 0859   GFRAA >60 06/12/2017 0800   GFRAA >60 06/09/2017 0809   GFRAA >89 01/25/2015 0859         ASSESSMENT and THERAPY PLAN:   Breast cancer of lower-outer quadrant of right female breast Right breast invasive ductal carcinoma ER/PR  positive HER-2 negative stage II A. T3, N1, M0 with PTEN mutation Prior treatment: Adjuvant Zoladex and exemestane started 07/03/2013, plan is for 5 years; changed to every 3 month Zoladex injection from May 2016 ------------------------------------------------------------------------------ Breast MRI 04/22/2017: 2.3 cm malignancy lower outer quadrant right breast within the lumpectomy scar  Recommendation: 1.  Neoadjuvant chemotherapy with carboplatin and gemcitabine day 1 and 8 every 3 weeks for 6 cycles 2. followed by mastectomy 3.  Followed by continued antiestrogen therapy  PET/CT scan: Negative for distant metastasis -------------------------------------------------------------------------------- Current treatment: Cycle 2 day 1 carboplatin and gemcitabine Chemo toxicities: 1 fatigue.  2. constipation she uses MiraLAX 3.  Elevated LFTs: Monitoring closely.  She may have fatty liver. Diabetes mellitus: Watching her blood sugars as well.  Normocytic anemia: Patient has spherocytosis.  She received blood transfusion and her hemoglobin today is better.  We will have to closely monitor her.  She is trying to eat more meat to increase her iron in the diet.  Return to clinic in 1 week for cycle 2-day 8 and in 3 weeks for cycle 3 and follow-up with me. No orders of the defined types were placed in this encounter.   All questions were answered. The patient knows to call the clinic with any problems, questions or concerns. We can certainly see the patient much sooner if necessary. This note was electronically signed. Harriette Ohara, MD 06/23/2017

## 2017-06-23 NOTE — Assessment & Plan Note (Signed)
Right breast invasive ductal carcinoma ER/PR positive HER-2 negative stage II A. T3, N1, M0 with PTEN mutation Prior treatment: Adjuvant Zoladex and exemestane started 07/03/2013, plan is for 5 years; changed to every 3 month Zoladex injection from May 2016 ------------------------------------------------------------------------------ Breast MRI 04/22/2017: 2.3 cm malignancy lower outer quadrant right breast within the lumpectomy scar  Recommendation: 1.  Neoadjuvant chemotherapy with carboplatin and gemcitabine day 1 and 8 every 3 weeks for 6 cycles 2. followed by mastectomy 3.  Followed by continued antiestrogen therapy  PET/CT scan: Negative for distant metastasis --------------------------------------------------------------------------------Current treatment: Cycle 2 day 1 carboplatin and gemcitabine

## 2017-06-26 ENCOUNTER — Encounter: Payer: Self-pay | Admitting: Family Medicine

## 2017-06-26 ENCOUNTER — Other Ambulatory Visit: Payer: Self-pay

## 2017-06-26 ENCOUNTER — Ambulatory Visit: Payer: BLUE CROSS/BLUE SHIELD | Admitting: Family Medicine

## 2017-06-26 VITALS — BP 108/67 | HR 86 | Temp 97.6°F | Resp 17 | Ht 65.0 in | Wt 185.2 lb

## 2017-06-26 DIAGNOSIS — R17 Unspecified jaundice: Secondary | ICD-10-CM | POA: Diagnosis not present

## 2017-06-26 DIAGNOSIS — E118 Type 2 diabetes mellitus with unspecified complications: Secondary | ICD-10-CM | POA: Diagnosis not present

## 2017-06-26 DIAGNOSIS — R530 Neoplastic (malignant) related fatigue: Secondary | ICD-10-CM | POA: Diagnosis not present

## 2017-06-26 DIAGNOSIS — D580 Hereditary spherocytosis: Secondary | ICD-10-CM

## 2017-06-26 LAB — POCT GLYCOSYLATED HEMOGLOBIN (HGB A1C): HEMOGLOBIN A1C: 5.8 % — AB (ref 4.0–5.6)

## 2017-06-26 NOTE — Assessment & Plan Note (Signed)
Discussed stopping metformin due to well controlled sugars, weight loss and a healthy diet She is instructed to continue checking her blood sugars at home

## 2017-06-26 NOTE — Progress Notes (Signed)
Chief Complaint  Patient presents with  . A1C    HPI  Type 2 Diabetes She reports that she eats smaller portion She is taking chemo and tries to avoid eating too much since it causes stomach pains She is complaint with her metformin once a day  Lab Results  Component Value Date   HGBA1C 5.8 (A) 06/26/2017    Wt Readings from Last 3 Encounters:  06/26/17 185 lb 3.2 oz (84 kg)  06/23/17 189 lb (85.7 kg)  06/11/17 191 lb (86.6 kg)   She is getting chemo on Wednesdays She states that she is doing six units 2 Wednesday in a row is a unit She has 9 more units to go She should be able to get her surgery mid September  Right breast cancer Anemia  She reports that her mood is pretty good She denies depressed moods She is sleeping well She reports that she stays tired She reports that 2 weeks ago her second treatment took her "way down" and dropped her hemoglobin and required a unit of prbcs after her treatment.  She states that she passed out due to severe anemia and hypotension and was hospitalized.  She states that she was given 6L of fluid and another unit of red blood cells. Lab Results  Component Value Date   WBC 5.4 06/23/2017   HGB 9.8 (L) 06/23/2017   HCT 28.1 (L) 06/23/2017   MCV 86.9 06/23/2017   PLT 168 06/23/2017    Depression screen PHQ 2/9 06/26/2017 05/29/2017 05/11/2017 05/01/2017 11/04/2016  Decreased Interest 0 0 0 0 0  Down, Depressed, Hopeless 0 0 0 0 0  PHQ - 2 Score 0 0 0 0 0  Some recent data might be hidden       Past Medical History:  Diagnosis Date  . Anemia   . Blood transfusion without reported diagnosis   . Chronic anemia 03/03/13  . Diabetes mellitus without complication ([place])    diabetes II  . Dyspnea    with exertion  . Family history of adverse reaction to anesthesia     father- hard to awaken  . Hereditary spherocytosis ([place])   . Invasive ductal carcinoma of right breast ([place]) 09/2012   ER(99%), PR(100%), Ki-67(70%), 1/5 nodes  positive  . S/P radiation therapy 05/08/2013-06/22/2013   1) Right breast / 50.4 Gy in 28 fractions / 2) Right Supraclavicular fossa/ 50.4 Gy in 28 fractions /3) Right Posterior Axillary boost / 11.9 Gy in 28 fractions /4) Right breast boost / 10 Gy in 5 fractions   . Spherocytosis ([place]) 1989  . Status post chemotherapy 10/07/12 - 12/16/12    Neoadjuvant chemotherapy with dose dense FEC (5-FU/epirubicin/Cytoxan) from 10/07/2012 through 12/16/12.    Marland Kitchen Syncope and collapse 1989   "thats when I found out I have Spherocytosis"  . Thyroid disease   . Use of exemestane (Aromasin) 07/25/13  . Use of goserelin acetate (Zoladex) 07/25/13  . Wears glasses     Current Outpatient Medications  Medication Sig Dispense Refill  . cetirizine HCl (ZYRTEC) 5 MG/5ML SOLN Take 5 mLs (5 mg total) by mouth daily. 601 mL   . folic acid (FOLVITE) 1 MG tablet Take 1 mg by mouth daily.    . metFORMIN (GLUCOPHAGE) 500 MG tablet TAKE 1 TABLET BY MOUTH EVERY DAY WITH BREAKFAST 30 tablet 0  . polyethylene glycol (MIRALAX / GLYCOLAX) packet Take 17 g by mouth daily as needed for mild constipation or moderate constipation.    Marland Kitchen  ondansetron (ZOFRAN) 8 MG tablet Take 1 tablet (8 mg total) by mouth every 8 (eight) hours as needed for nausea. (Patient not taking: Reported on 06/26/2017) 30 tablet 3  . prochlorperazine (COMPAZINE) 10 MG tablet Take 1 tablet (10 mg total) by mouth every 6 (six) hours as needed for nausea or vomiting. (Patient not taking: Reported on 06/26/2017) 30 tablet 0   No current facility-administered medications for this visit.     Allergies: No Known Allergies  Past Surgical History:  Procedure Laterality Date  . BREAST LUMPECTOMY WITH SENTINEL LYMPH NODE BIOPSY Right 04/06/2013   [name]  . BREAST SURGERY     left breast lumpectomy/left ax snbx  . COLONOSCOPY  2015  . PORT-A-CATH REMOVAL N/A 04/06/2013   Procedure: REMOVAL PORT-A-CATH;  Surgeon: [name], MD;  Location: [facility];  Service: General;  Laterality: N/A;  . PORTACATH PLACEMENT Left 09/20/2012   Procedure: INSERTION PORT-A-CATH;  Surgeon: [name], MD;  Location: [facility];  Service: General;  Laterality: Left;  . PORTACATH PLACEMENT Right 05/18/2017   Procedure: INSERTION PORT-A-CATH WITH ULTRASOUND;  Surgeon: [name], MD;  Location: [facility];  Service: General;  Laterality: Right;  . WISDOM TOOTH EXTRACTION      Social History   Socioeconomic History  . Marital status: Single    Spouse name: Not on file  . Number of children: Not on file  . Years of education: Not on file  . Highest education level: Not on file  Occupational History  . Not on file  Social Needs  . Financial resource strain: Not on file  . Food insecurity:    Worry: Not on file    Inability: Not on file  . Transportation needs:    Medical: Not on file    Non-medical: Not on file  Tobacco Use  . Smoking status: Never Smoker  . Smokeless tobacco: Never Used  Substance and Sexual Activity  . Alcohol use: No  . Drug use: No  . Sexual activity: Yes    Birth control/protection: Abstinence    Comment: menarche age 89, P59,  last menstrual cycle 08/26/2012  Lifestyle  . Physical activity:    Days per week: Not on file    Minutes per session: Not on file  . Stress: Not on file  Relationships  . Social connections:    Talks on phone: Not on file    Gets together: Not on file    Attends religious service: Not on file    Active member of club or organization: Not on file    Attends meetings of clubs or organizations: Not on file    Relationship status: Not on file  Other Topics Concern  . Not on file  Social History Narrative  . Not on file    Family History  Problem Relation Age of Onset  . Pulmonary embolism Maternal Uncle 44       died instantly  . Melanoma Cousin        paternal cousin dx in her 53s  . Heart disease Father   . Diabetes Father   . Thyroid disease  Mother      ROS Review of Systems See HPI Constitution: No fevers or chills No malaise No diaphoresis Skin: No rash or itching Eyes: no blurry vision, no double vision GU: no dysuria or hematuria Neuro: no dizziness or headaches all others reviewed and negative   Objective: Vitals:   06/26/17 0845  BP: 108/67  Pulse: 86  Resp: 17  Temp: 97.6 F (36.4 C)  TempSrc: Oral  SpO2: 100%  Weight: 185 lb 3.2 oz (84 kg)  Height: _0  (1.651 m)    Physical Exam Physical Exam  Constitutional: She is oriented to person, place, and time. She appears well-developed and well-nourished.  HENT:  Head: Normocephalic and atraumatic.  Eyes: Conjunctivae and EOM are normal.  Cardiovascular: Normal rate, regular rhythm and normal heart sounds.   Pulmonary/Chest: Effort normal and breath sounds normal. No respiratory distress. She has no wheezes.  Abdominal: Normal appearance and bowel sounds are normal. There is no tenderness. There is no CVA tenderness. Percussed the liver and the liver edge is in the appropriate space. Neurological: She is alert and oriented to person, place, and time.   Component     Latest Ref Rng & Units 06/23/2017  Sodium     136 - 145 mmol/L 142  Potassium     3.5 - 5.1 mmol/L 4.1  Chloride     98 - 109 mmol/L 107  CO2     22 - 29 mmol/L 27  Glucose     70 - 140 mg/dL 162 (H)  BUN     7 - 26 mg/dL 9  Creatinine     0.60 - 1.10 mg/dL 0.66  Calcium     8.4 - 10.4 mg/dL 8.8  Total Protein     6.4 - 8.3 g/dL 6.8  Albumin     3.5 - 5.0 g/dL 3.8  AST     5 - 34 U/L 22  ALT     0 - 55 U/L 42  Alkaline Phosphatase     40 - 150 U/L 117  Total Bilirubin     0.2 - 1.2 mg/dL 1.5 (H)  GFR, Est Non African American     >60 mL/min >60  GFR, Est African American     >60 mL/min >60  Anion gap     3 - 11 8   Assessment and Plan Geni was seen today for a1c.  Diagnoses and all orders for this visit:   Problem List Items Addressed This Visit       Endocrine   Type 2 diabetes mellitus with complication, without long-term current use of insulin ([place]) - Primary    Discussed stopping metformin due to well controlled sugars, weight loss and a healthy diet She is instructed to continue checking her blood sugars at home       Relevant Orders   POCT glycosylated hemoglobin (Hb A1C) (Completed)     Other   Anemia, unspecified   Elevated bilirubin    Discussed that her total bilirubin could be due to hemolysis of her rbcs She is being monitored for this by her Oncology      Neoplastic malignant related fatigue    Patient with multiple factors for fatigue - anemia - neoplasm - diabetes - chemo Her diabetes is well controlled with a1c 5.8% will stop the metformin as the risk of hypoglycemia is great and will be hard to identify since her chemo side effects are almost the same as hypoglycemia. She understands that correction of her anemia will also help.            [place]

## 2017-06-26 NOTE — Patient Instructions (Addendum)
Stop metformin  Continue to check blood glucose Call if your readings go up into the 240s or higher and stays there.     IF you received an x-ray today, you will receive an invoice from [facility]. Please contact [facility] at [phone] with questions or concerns regarding your invoice.   IF you received labwork today, you will receive an invoice from [facility]. Please contact [facility] at [phone] with questions or concerns regarding your invoice.   Our billing staff will not be able to assist you with questions regarding bills from these companies.  You will be contacted with the lab results as soon as they are available. The fastest way to get your results is to activate your My Chart account. Instructions are located on the last page of this paperwork. If you have not heard from [place] regarding the results in 2 weeks, please contact this office.

## 2017-06-26 NOTE — Assessment & Plan Note (Addendum)
Discussed that her total bilirubin could be due to hemolysis of her rbcs She is being monitored for this by her Oncology She is also s/p transfusions

## 2017-06-26 NOTE — Assessment & Plan Note (Signed)
Patient with multiple factors for fatigue - anemia - neoplasm - diabetes - chemo Her diabetes is well controlled with a1c 5.8% will stop the metformin as the risk of hypoglycemia is great and will be hard to identify since her chemo side effects are almost the same as hypoglycemia. She understands that correction of her anemia will also help.

## 2017-06-30 ENCOUNTER — Inpatient Hospital Stay: Payer: BLUE CROSS/BLUE SHIELD

## 2017-06-30 ENCOUNTER — Ambulatory Visit: Payer: BLUE CROSS/BLUE SHIELD

## 2017-06-30 ENCOUNTER — Other Ambulatory Visit: Payer: Self-pay

## 2017-06-30 DIAGNOSIS — Z170 Estrogen receptor positive status [ER+]: Principal | ICD-10-CM

## 2017-06-30 DIAGNOSIS — N189 Chronic kidney disease, unspecified: Secondary | ICD-10-CM

## 2017-06-30 DIAGNOSIS — D631 Anemia in chronic kidney disease: Secondary | ICD-10-CM

## 2017-06-30 DIAGNOSIS — Z95828 Presence of other vascular implants and grafts: Secondary | ICD-10-CM

## 2017-06-30 DIAGNOSIS — Z5111 Encounter for antineoplastic chemotherapy: Secondary | ICD-10-CM | POA: Diagnosis not present

## 2017-06-30 DIAGNOSIS — C50511 Malignant neoplasm of lower-outer quadrant of right female breast: Secondary | ICD-10-CM

## 2017-06-30 LAB — CBC WITH DIFFERENTIAL (CANCER CENTER ONLY)
BASOS ABS: 0 10*3/uL (ref 0.0–0.1)
BASOS PCT: 1 %
EOS ABS: 0 10*3/uL (ref 0.0–0.5)
Eosinophils Relative: 0 %
HCT: 23.4 % — ABNORMAL LOW (ref 34.8–46.6)
HEMOGLOBIN: 7.9 g/dL — AB (ref 11.6–15.9)
Lymphocytes Relative: 31 %
Lymphs Abs: 1.3 10*3/uL (ref 0.9–3.3)
MCH: 28.8 pg (ref 25.1–34.0)
MCHC: 33.8 g/dL (ref 31.5–36.0)
MCV: 85.4 fL (ref 79.5–101.0)
Monocytes Absolute: 0.3 10*3/uL (ref 0.1–0.9)
Monocytes Relative: 8 %
NEUTROS PCT: 60 %
Neutro Abs: 2.6 10*3/uL (ref 1.5–6.5)
Platelet Count: 114 10*3/uL — ABNORMAL LOW (ref 145–400)
RBC: 2.74 MIL/uL — ABNORMAL LOW (ref 3.70–5.45)
RDW: 21.8 % — ABNORMAL HIGH (ref 11.2–14.5)
WBC Count: 4.3 10*3/uL (ref 3.9–10.3)

## 2017-06-30 LAB — CMP (CANCER CENTER ONLY)
ALBUMIN: 3.8 g/dL (ref 3.5–5.0)
ALK PHOS: 105 U/L (ref 40–150)
ALT: 68 U/L — ABNORMAL HIGH (ref 0–55)
ANION GAP: 9 (ref 3–11)
AST: 28 U/L (ref 5–34)
BILIRUBIN TOTAL: 1.4 mg/dL — AB (ref 0.2–1.2)
BUN: 9 mg/dL (ref 7–26)
CALCIUM: 8.6 mg/dL (ref 8.4–10.4)
CO2: 24 mmol/L (ref 22–29)
Chloride: 107 mmol/L (ref 98–109)
Creatinine: 0.62 mg/dL (ref 0.60–1.10)
GFR, Est AFR Am: >60 mL/min (ref >60)
GFR, Estimated: >60 mL/min (ref >60)
Glucose, Bld: 158 mg/dL — ABNORMAL HIGH (ref 70–140)
POTASSIUM: 4.1 mmol/L (ref 3.5–5.1)
SODIUM: 140 mmol/L (ref 136–145)
TOTAL PROTEIN: 6.8 g/dL (ref 6.4–8.3)

## 2017-06-30 LAB — PREPARE RBC (CROSSMATCH)

## 2017-06-30 MED ORDER — DIPHENHYDRAMINE HCL 25 MG PO CAPS
25.0000 mg | ORAL_CAPSULE | Freq: Once | ORAL | Status: AC
  Administered 2017-06-30: 25 mg via ORAL

## 2017-06-30 MED ORDER — HEPARIN SOD (PORK) LOCK FLUSH 100 UNIT/ML IV SOLN
500.0000 [IU] | Freq: Every day | INTRAVENOUS | Status: AC | PRN
  Administered 2017-06-30: 500 [IU]
  Filled 2017-06-30: qty 5

## 2017-06-30 MED ORDER — ACETAMINOPHEN 325 MG PO TABS
650.0000 mg | ORAL_TABLET | Freq: Once | ORAL | Status: AC
  Administered 2017-06-30: 650 mg via ORAL

## 2017-06-30 MED ORDER — ALTEPLASE 2 MG IJ SOLR
2.0000 mg | Freq: Once | INTRAMUSCULAR | Status: DC | PRN
  Filled 2017-06-30: qty 2

## 2017-06-30 MED ORDER — HEPARIN SOD (PORK) LOCK FLUSH 100 UNIT/ML IV SOLN
500.0000 [IU] | Freq: Once | INTRAVENOUS | Status: DC
  Filled 2017-06-30: qty 5

## 2017-06-30 MED ORDER — SODIUM CHLORIDE 0.9 % IV SOLN
250.0000 mL | Freq: Once | INTRAVENOUS | Status: AC
  Administered 2017-06-30: 250 mL via INTRAVENOUS

## 2017-06-30 MED ORDER — SODIUM CHLORIDE 0.9% FLUSH
10.0000 mL | INTRAVENOUS | Status: AC | PRN
  Administered 2017-06-30: 10 mL
  Filled 2017-06-30: qty 10

## 2017-06-30 MED ORDER — ACETAMINOPHEN 325 MG PO TABS
ORAL_TABLET | ORAL | Status: AC
  Filled 2017-06-30: qty 2

## 2017-06-30 MED ORDER — DIPHENHYDRAMINE HCL 25 MG PO CAPS
ORAL_CAPSULE | ORAL | Status: AC
  Filled 2017-06-30: qty 1

## 2017-06-30 MED ORDER — SODIUM CHLORIDE 0.9% FLUSH
10.0000 mL | INTRAVENOUS | Status: DC | PRN
  Administered 2017-06-30: 10 mL via INTRAVENOUS
  Filled 2017-06-30: qty 10

## 2017-06-30 NOTE — Progress Notes (Signed)
Chemotherapy held today for blood transfusion 2 units per Dr Lindi Adie.  Will r/s chemo for this week.

## 2017-06-30 NOTE — Patient Instructions (Signed)

## 2017-07-01 ENCOUNTER — Inpatient Hospital Stay: Payer: BLUE CROSS/BLUE SHIELD

## 2017-07-01 VITALS — BP 130/66 | HR 75 | Temp 98.0°F | Resp 18

## 2017-07-01 DIAGNOSIS — C50511 Malignant neoplasm of lower-outer quadrant of right female breast: Secondary | ICD-10-CM

## 2017-07-01 DIAGNOSIS — Z170 Estrogen receptor positive status [ER+]: Principal | ICD-10-CM

## 2017-07-01 DIAGNOSIS — Z5111 Encounter for antineoplastic chemotherapy: Secondary | ICD-10-CM | POA: Diagnosis not present

## 2017-07-01 LAB — TYPE AND SCREEN
ABO/RH(D): B POS
Antibody Screen: NEGATIVE
UNIT DIVISION: 0
Unit division: 0

## 2017-07-01 LAB — BPAM RBC
BLOOD PRODUCT EXPIRATION DATE: 201906142359
BLOOD PRODUCT EXPIRATION DATE: 201906142359
ISSUE DATE / TIME: 201905291110
ISSUE DATE / TIME: 201905291110
UNIT TYPE AND RH: 7300
Unit Type and Rh: 7300

## 2017-07-01 MED ORDER — PALONOSETRON HCL INJECTION 0.25 MG/5ML
INTRAVENOUS | Status: AC
  Filled 2017-07-01: qty 5

## 2017-07-01 MED ORDER — CARBOPLATIN CHEMO INJECTION 450 MG/45ML
290.0000 mg | Freq: Once | INTRAVENOUS | Status: AC
  Administered 2017-07-01: 290 mg via INTRAVENOUS
  Filled 2017-07-01: qty 29

## 2017-07-01 MED ORDER — SODIUM CHLORIDE 0.9 % IV SOLN
Freq: Once | INTRAVENOUS | Status: AC
  Administered 2017-07-01: 15:00:00 via INTRAVENOUS

## 2017-07-01 MED ORDER — SODIUM CHLORIDE 0.9 % IV SOLN
1000.0000 mg/m2 | Freq: Once | INTRAVENOUS | Status: AC
  Administered 2017-07-01: 2014 mg via INTRAVENOUS
  Filled 2017-07-01: qty 52.97

## 2017-07-01 MED ORDER — HEPARIN SOD (PORK) LOCK FLUSH 100 UNIT/ML IV SOLN
500.0000 [IU] | Freq: Once | INTRAVENOUS | Status: AC | PRN
  Administered 2017-07-01: 500 [IU]
  Filled 2017-07-01: qty 5

## 2017-07-01 MED ORDER — SODIUM CHLORIDE 0.9% FLUSH
10.0000 mL | INTRAVENOUS | Status: DC | PRN
  Administered 2017-07-01: 10 mL
  Filled 2017-07-01: qty 10

## 2017-07-01 MED ORDER — PALONOSETRON HCL INJECTION 0.25 MG/5ML
0.2500 mg | Freq: Once | INTRAVENOUS | Status: AC
  Administered 2017-07-01: 0.25 mg via INTRAVENOUS

## 2017-07-01 NOTE — Progress Notes (Signed)
Ok to treat with 06/30/17 lab results per Dr. Lindi Adie. Patient received 2 units of packed red blood cells on 06/30/17.

## 2017-07-01 NOTE — Patient Instructions (Signed)
[facility] Discharge Instructions for Patients Receiving Chemotherapy  Today you received the following chemotherapy agents: Gemzar, Carboplatin   To help prevent nausea and vomiting after your treatment, we encourage you to take your nausea medication as directed.   If you develop nausea and vomiting that is not controlled by your nausea medication, call the clinic.   BELOW ARE SYMPTOMS THAT SHOULD BE REPORTED IMMEDIATELY:  *FEVER GREATER THAN 100.5 F  *CHILLS WITH OR WITHOUT FEVER  NAUSEA AND VOMITING THAT IS NOT CONTROLLED WITH YOUR NAUSEA MEDICATION  *UNUSUAL SHORTNESS OF BREATH  *UNUSUAL BRUISING OR BLEEDING  TENDERNESS IN MOUTH AND THROAT WITH OR WITHOUT PRESENCE OF ULCERS  *URINARY PROBLEMS  *BOWEL PROBLEMS  UNUSUAL RASH Items with * indicate a potential emergency and should be followed up as soon as possible.  Feel free to call the clinic should you have any questions or concerns. The clinic phone number is [phone].  Please show the CHEMO ALERT CARD at check-in to the Emergency Department and triage nurse.   

## 2017-07-14 ENCOUNTER — Inpatient Hospital Stay: Payer: BLUE CROSS/BLUE SHIELD | Admitting: Hematology and Oncology

## 2017-07-14 ENCOUNTER — Inpatient Hospital Stay: Payer: BLUE CROSS/BLUE SHIELD | Attending: Hematology and Oncology

## 2017-07-14 ENCOUNTER — Telehealth: Payer: Self-pay | Admitting: Hematology and Oncology

## 2017-07-14 ENCOUNTER — Inpatient Hospital Stay: Payer: BLUE CROSS/BLUE SHIELD

## 2017-07-14 VITALS — BP 113/57 | HR 69 | Temp 98.0°F | Resp 17 | Ht 65.0 in | Wt 187.8 lb

## 2017-07-14 DIAGNOSIS — C50511 Malignant neoplasm of lower-outer quadrant of right female breast: Secondary | ICD-10-CM | POA: Diagnosis not present

## 2017-07-14 DIAGNOSIS — R5383 Other fatigue: Secondary | ICD-10-CM

## 2017-07-14 DIAGNOSIS — Z5111 Encounter for antineoplastic chemotherapy: Secondary | ICD-10-CM | POA: Diagnosis not present

## 2017-07-14 DIAGNOSIS — E119 Type 2 diabetes mellitus without complications: Secondary | ICD-10-CM | POA: Diagnosis not present

## 2017-07-14 DIAGNOSIS — R945 Abnormal results of liver function studies: Secondary | ICD-10-CM

## 2017-07-14 DIAGNOSIS — T451X5A Adverse effect of antineoplastic and immunosuppressive drugs, initial encounter: Secondary | ICD-10-CM

## 2017-07-14 DIAGNOSIS — Z170 Estrogen receptor positive status [ER+]: Secondary | ICD-10-CM

## 2017-07-14 DIAGNOSIS — D649 Anemia, unspecified: Secondary | ICD-10-CM | POA: Insufficient documentation

## 2017-07-14 DIAGNOSIS — Z95828 Presence of other vascular implants and grafts: Secondary | ICD-10-CM

## 2017-07-14 DIAGNOSIS — D580 Hereditary spherocytosis: Secondary | ICD-10-CM | POA: Insufficient documentation

## 2017-07-14 DIAGNOSIS — Z7984 Long term (current) use of oral hypoglycemic drugs: Secondary | ICD-10-CM | POA: Diagnosis not present

## 2017-07-14 DIAGNOSIS — K5900 Constipation, unspecified: Secondary | ICD-10-CM | POA: Insufficient documentation

## 2017-07-14 DIAGNOSIS — D61810 Antineoplastic chemotherapy induced pancytopenia: Secondary | ICD-10-CM

## 2017-07-14 LAB — CBC WITH DIFFERENTIAL (CANCER CENTER ONLY)
BASOS ABS: 0.1 10*3/uL (ref 0.0–0.1)
Basophils Relative: 1 %
Eosinophils Absolute: 0.1 10*3/uL (ref 0.0–0.5)
Eosinophils Relative: 2 %
HEMATOCRIT: 28.7 % — AB (ref 34.8–46.6)
HEMOGLOBIN: 9.9 g/dL — AB (ref 11.6–15.9)
LYMPHS ABS: 1.2 10*3/uL (ref 0.9–3.3)
LYMPHS PCT: 24 %
MCH: 30.2 pg (ref 25.1–34.0)
MCHC: 34.4 g/dL (ref 31.5–36.0)
MCV: 87.6 fL (ref 79.5–101.0)
Monocytes Absolute: 0.4 10*3/uL (ref 0.1–0.9)
Monocytes Relative: 8 %
NEUTROS ABS: 3.4 10*3/uL (ref 1.5–6.5)
NEUTROS PCT: 65 %
Platelet Count: 148 10*3/uL (ref 145–400)
RBC: 3.27 MIL/uL — AB (ref 3.70–5.45)
RDW: 24 % — ABNORMAL HIGH (ref 11.2–14.5)
WBC: 5.1 10*3/uL (ref 3.9–10.3)

## 2017-07-14 LAB — CMP (CANCER CENTER ONLY)
ALK PHOS: 117 U/L (ref 40–150)
ALT: 39 U/L (ref 0–55)
AST: 21 U/L (ref 5–34)
Albumin: 3.9 g/dL (ref 3.5–5.0)
Anion gap: 9 (ref 3–11)
BILIRUBIN TOTAL: 1.1 mg/dL (ref 0.2–1.2)
BUN: 11 mg/dL (ref 7–26)
CALCIUM: 8.7 mg/dL (ref 8.4–10.4)
CHLORIDE: 104 mmol/L (ref 98–109)
CO2: 27 mmol/L (ref 22–29)
CREATININE: 0.65 mg/dL (ref 0.60–1.10)
GFR, Est AFR Am: >60 mL/min (ref >60)
Glucose, Bld: 192 mg/dL — ABNORMAL HIGH (ref 70–140)
Potassium: 4.1 mmol/L (ref 3.5–5.1)
Sodium: 140 mmol/L (ref 136–145)
Total Protein: 7 g/dL (ref 6.4–8.3)

## 2017-07-14 MED ORDER — SODIUM CHLORIDE 0.9% FLUSH
10.0000 mL | INTRAVENOUS | Status: DC | PRN
  Administered 2017-07-14: 10 mL
  Filled 2017-07-14: qty 10

## 2017-07-14 MED ORDER — SODIUM CHLORIDE 0.9 % IV SOLN
1000.0000 mg/m2 | Freq: Once | INTRAVENOUS | Status: AC
  Administered 2017-07-14: 2014 mg via INTRAVENOUS
  Filled 2017-07-14: qty 52.97

## 2017-07-14 MED ORDER — PALONOSETRON HCL INJECTION 0.25 MG/5ML
INTRAVENOUS | Status: AC
  Filled 2017-07-14: qty 5

## 2017-07-14 MED ORDER — SODIUM CHLORIDE 0.9 % IV SOLN
Freq: Once | INTRAVENOUS | Status: AC
  Administered 2017-07-14: 09:00:00 via INTRAVENOUS

## 2017-07-14 MED ORDER — HEPARIN SOD (PORK) LOCK FLUSH 100 UNIT/ML IV SOLN
500.0000 [IU] | Freq: Once | INTRAVENOUS | Status: AC | PRN
  Administered 2017-07-14: 500 [IU]
  Filled 2017-07-14: qty 5

## 2017-07-14 MED ORDER — SODIUM CHLORIDE 0.9% FLUSH
10.0000 mL | INTRAVENOUS | Status: DC | PRN
  Administered 2017-07-14: 10 mL via INTRAVENOUS
  Filled 2017-07-14: qty 10

## 2017-07-14 MED ORDER — PALONOSETRON HCL INJECTION 0.25 MG/5ML
0.2500 mg | Freq: Once | INTRAVENOUS | Status: AC
  Administered 2017-07-14: 0.25 mg via INTRAVENOUS

## 2017-07-14 MED ORDER — SODIUM CHLORIDE 0.9 % IV SOLN
289.6000 mg | Freq: Once | INTRAVENOUS | Status: AC
  Administered 2017-07-14: 290 mg via INTRAVENOUS
  Filled 2017-07-14: qty 29

## 2017-07-14 NOTE — Progress Notes (Signed)
 Patient Care Team: Stallings, Zoe A, MD as PCP - General (Internal Medicine) Gudena, Vinay, MD as Consulting Physician (Hematology and Oncology)  DIAGNOSIS:  Encounter Diagnosis  Name Primary?  . Malignant neoplasm of lower-outer quadrant of right breast of female, estrogen receptor positive (HCC)     SUMMARY OF ONCOLOGIC HISTORY:   Breast cancer of lower-outer quadrant of right female breast (HCC)   09/08/2012 Initial Diagnosis    Breast cancer of lower-outer quadrant of right female breast: ER 99% PR 100% HER-2 negative, Ki-67 70%      10/07/2012 - 03/17/2013 Neo-Adjuvant Chemotherapy    Dose dense FEC followed by weekly Taxol      04/06/2013 Surgery    Right breast lumpectomy residual 1.1 cm IDC grade 3 with high-grade DCIS, PNI +ve, 1/2 SLN positive, no MVI, T1 C. N1 A. stage IIA, ER 97% PR 95% HER-2 1.1 ratio negative      05/08/2013 - 06/22/2013 Radiation Therapy    Radiation therapy to lumpectomy site      07/03/2013 - 04/27/2017 Anti-estrogen oral therapy    Zoladex plus Aromasin       Procedure    Genetic testing:PTEN C.-([id number])del 26 on [employer] testing      04/15/2017 Relapse/Recurrence    Right breast biopsy: Invasive ductal carcinoma, grade 3, ER 10%, PR 0%, Ki-67 30%, HER-2 equivocal, ratio 1.36, copy #5.2, IHC 1+ negative      04/22/2017 Breast MRI    Malignancy LOQ right breast 2.3 cm within the lumpectomy scar, left breast normal      06/11/2017 - 06/12/2017 Hospital Admission    Syncope due to anemia required blood transfusion       Malignant neoplasm of lower-outer quadrant of right breast of female, estrogen receptor positive (HCC)   04/28/2017 Initial Diagnosis    Malignant neoplasm of lower-outer quadrant of right breast of female, estrogen receptor positive (HCC)      04/28/2017 -  Chemotherapy    The patient had palonosetron (ALOXI) injection 0.25 mg, 0.25 mg, Intravenous,  Once, 2 of 6 cycles Administration: 0.25 mg (06/02/2017), 0.25 mg  (06/09/2017), 0.25 mg (06/23/2017), 0.25 mg (07/01/2017) CARBOplatin (PARAPLATIN) 290 mg in sodium chloride 0.9 % 250 mL chemo infusion, 290 mg (100 % of original dose 289.6 mg), Intravenous,  Once, 2 of 6 cycles Dose modification:   (original dose 289.6 mg, Cycle 1) Administration: 290 mg (06/02/2017), 290 mg (06/09/2017), 290 mg (06/23/2017), 290 mg (07/01/2017) gemcitabine (GEMZAR) 2,014 mg in sodium chloride 0.9 % 250 mL chemo infusion, 1,000 mg/m2 = 2,014 mg, Intravenous,  Once, 2 of 6 cycles Administration: 2,014 mg (06/02/2017), 2,014 mg (06/09/2017), 2,014 mg (06/23/2017), 2,014 mg (07/01/2017)  for chemotherapy treatment.        CHIEF COMPLIANT: Cycle 3 carboplatin and gemcitabine  INTERVAL HISTORY: Mary Dougherty is a 47-year-old with above-mentioned his right breast cancer who is currently on neoadjuvant chemotherapy with carboplatin and gemcitabine.  Today is cycle 3-day 1.  Her biggest issue has been anemia.  She has received blood transfusion which appears to be helping her.  She also has fatigue and constipation.  Otherwise she is tolerating chemo fairly well.  REVIEW OF SYSTEMS:   Constitutional: Denies fevers, chills or abnormal weight loss, complains of fatigue Eyes: Denies blurriness of vision Ears, nose, mouth, throat, and face: Denies mucositis or sore throat Respiratory: Denies cough, dyspnea or wheezes Cardiovascular: Denies palpitation, chest discomfort Gastrointestinal: Constipation Skin: Denies abnormal skin rashes Lymphatics: Denies new lymphadenopathy or easy bruising   Neurological:Denies numbness, tingling or new weaknesses Behavioral/Psych: Mood is stable, no new changes  Extremities: No lower extremity edema  All other systems were reviewed with the patient and are negative.  I have reviewed the past medical history, past surgical history, social history and family history with the patient and they are unchanged from previous note.  ALLERGIES:  has No Known  Allergies.  MEDICATIONS:  Current Outpatient Medications  Medication Sig Dispense Refill  . cetirizine HCl (ZYRTEC) 5 MG/5ML SOLN Take 5 mLs (5 mg total) by mouth daily. 938 mL   . folic acid (FOLVITE) 1 MG tablet Take 1 mg by mouth daily.    . metFORMIN (GLUCOPHAGE) 500 MG tablet TAKE 1 TABLET BY MOUTH EVERY DAY WITH BREAKFAST 30 tablet 0  . ondansetron (ZOFRAN) 8 MG tablet Take 1 tablet (8 mg total) by mouth every 8 (eight) hours as needed for nausea. (Patient not taking: Reported on 06/26/2017) 30 tablet 3  . polyethylene glycol (MIRALAX / GLYCOLAX) packet Take 17 g by mouth daily as needed for mild constipation or moderate constipation.    . prochlorperazine (COMPAZINE) 10 MG tablet Take 1 tablet (10 mg total) by mouth every 6 (six) hours as needed for nausea or vomiting. (Patient not taking: Reported on 06/26/2017) 30 tablet 0   No current facility-administered medications for this visit.     PHYSICAL EXAMINATION: ECOG PERFORMANCE STATUS: 1 - Symptomatic but completely ambulatory  Vitals:   07/14/17 0810  BP: (!) 113/57  Pulse: 69  Resp: 17  Temp: 98 F (36.7 C)  SpO2: 100%   Filed Weights   07/14/17 0810  Weight: 187 lb 12.8 oz (85.2 kg)    GENERAL:alert, no distress and comfortable SKIN: skin color, texture, turgor are normal, no rashes or significant lesions EYES: normal, Conjunctiva are pink and non-injected, sclera clear OROPHARYNX:no exudate, no erythema and lips, buccal mucosa, and tongue normal  NECK: supple, thyroid normal size, non-tender, without nodularity LYMPH:  no palpable lymphadenopathy in the cervical, axillary or inguinal LUNGS: clear to auscultation and percussion with normal breathing effort HEART: regular rate & rhythm and no murmurs and no lower extremity edema ABDOMEN:abdomen soft, non-tender and normal bowel sounds MUSCULOSKELETAL:no cyanosis of digits and no clubbing  NEURO: alert & oriented x 3 with fluent speech, no focal motor/sensory  deficits EXTREMITIES: No lower extremity edema  LABORATORY DATA:  I have reviewed the data as listed CMP Latest Ref Rng & Units 07/14/2017 06/30/2017 06/23/2017  Glucose 70 - 140 mg/dL 192(H) 158(H) 162(H)  BUN 7 - 26 mg/dL _0 Creatinine 0.60 - 1.10 mg/dL 0.65 0.62 0.66  Sodium 136 - 145 mmol/L 140 140 142  Potassium 3.5 - 5.1 mmol/L 4.1 4.1 4.1  Chloride 98 - 109 mmol/L 104 107 107  CO2 22 - 29 mmol/L _1 Calcium 8.4 - 10.4 mg/dL 8.7 8.6 8.8  Total Protein 6.4 - 8.3 g/dL 7.0 6.8 6.8  Total Bilirubin 0.2 - 1.2 mg/dL 1.1 1.4(H) 1.5(H)  Alkaline Phos 40 - 150 U/L 117 105 117  AST 5 - 34 U/L _2 ALT 0 - 55 U/L 39 68(H) 42    Lab Results  Component Value Date   WBC 5.1 07/14/2017   HGB 9.9 (L) 07/14/2017   HCT 28.7 (L) 07/14/2017   MCV 87.6 07/14/2017   PLT 148 07/14/2017   NEUTROABS 3.4 07/14/2017    ASSESSMENT & PLAN:  Breast cancer of lower-outer quadrant of right female breast  Right breast invasive ductal carcinoma ER/PR positive HER-2 negative stage II A. T3, N1, M0 with PTEN mutation Priortreatment: Adjuvant Zoladex and exemestane started 07/03/2013, plan is for 5 years; changed to every 3 month Zoladex injection from May 2016 ------------------------------------------------------------------------------ Breast MRI 04/22/2017: 2.3 cm malignancy lower outer quadrant right breast within the lumpectomy scar  Recommendation: 1.Neoadjuvant chemotherapy with carboplatin and gemcitabine day 1 and 8 every 3 weeks for 6 cycles 2.followed by mastectomy 3.Followed by continuedantiestrogen therapy  PET/CT scan: Negative for distant metastasis -------------------------------------------------------------------------------- Current treatment: Cycle 3 day 1 carboplatin and gemcitabine Chemo toxicities: 1 fatigue.  2. constipation she uses MiraLAX 3.  Elevated LFTs: Monitoring closely.  She may have fatty liver. Diabetes mellitus: Watching her blood sugars as  well.  Normocytic anemia: Patient has spherocytosis.  Patient had hospitalization for anemia and required blood transfusion.  Monitoring closely and transfusions as needed. Patient appears to need transfusions in the 8 of every cycle.  Return to clinic in 3 weeks for cycle 4   No orders of the defined types were placed in this encounter.  The patient has a good understanding of the overall plan. she agrees with it. she will call with any problems that may develop before the next visit here.   Viinay K Gudena, MD 07/14/17    

## 2017-07-14 NOTE — Assessment & Plan Note (Signed)
Right breast invasive ductal carcinoma ER/PR positive HER-2 negative stage II A. T3, N1, M0 with PTEN mutation Priortreatment: Adjuvant Zoladex and exemestane started 07/03/2013, plan is for 5 years; changed to every 3 month Zoladex injection from May 2016 ------------------------------------------------------------------------------ Breast MRI 04/22/2017: 2.3 cm malignancy lower outer quadrant right breast within the lumpectomy scar  Recommendation: 1.Neoadjuvant chemotherapy with carboplatin and gemcitabine day 1 and 8 every 3 weeks for 6 cycles 2.followed by mastectomy 3.Followed by continuedantiestrogen therapy  PET/CT scan: Negative for distant metastasis -------------------------------------------------------------------------------- Current treatment: Cycle 3 day 1 carboplatin and gemcitabine Chemo toxicities: 1 fatigue.  2. constipation she uses MiraLAX 3.  Elevated LFTs: Monitoring closely.  She may have fatty liver. Diabetes mellitus: Watching her blood sugars as well.  Normocytic anemia: Patient has spherocytosis.  Patient had hospitalization for anemia and required blood transfusion.  Monitoring closely and transfusions as needed.  Return to clinic in 3 weeks for cycle 4

## 2017-07-14 NOTE — Telephone Encounter (Signed)
Gave avs and calendar ° °

## 2017-07-14 NOTE — Patient Instructions (Signed)
[facility] Discharge Instructions for Patients Receiving Chemotherapy  Today you received the following chemotherapy agents: Gemzar, Carboplatin   To help prevent nausea and vomiting after your treatment, we encourage you to take your nausea medication as directed.   If you develop nausea and vomiting that is not controlled by your nausea medication, call the clinic.   BELOW ARE SYMPTOMS THAT SHOULD BE REPORTED IMMEDIATELY:  *FEVER GREATER THAN 100.5 F  *CHILLS WITH OR WITHOUT FEVER  NAUSEA AND VOMITING THAT IS NOT CONTROLLED WITH YOUR NAUSEA MEDICATION  *UNUSUAL SHORTNESS OF BREATH  *UNUSUAL BRUISING OR BLEEDING  TENDERNESS IN MOUTH AND THROAT WITH OR WITHOUT PRESENCE OF ULCERS  *URINARY PROBLEMS  *BOWEL PROBLEMS  UNUSUAL RASH Items with * indicate a potential emergency and should be followed up as soon as possible.  Feel free to call the clinic should you have any questions or concerns. The clinic phone number is [phone].  Please show the CHEMO ALERT CARD at check-in to the Emergency Department and triage nurse.   

## 2017-07-14 NOTE — Addendum Note (Signed)
Addended by: Marlynn Perking on: 07/14/2017 09:37 AM   Modules accepted: Orders

## 2017-07-20 NOTE — Progress Notes (Signed)
FMLA successfully faxed to [phone]. Mailed copy to patient address on file.

## 2017-07-21 ENCOUNTER — Other Ambulatory Visit: Payer: Self-pay

## 2017-07-21 ENCOUNTER — Inpatient Hospital Stay: Payer: BLUE CROSS/BLUE SHIELD

## 2017-07-21 DIAGNOSIS — D649 Anemia, unspecified: Secondary | ICD-10-CM

## 2017-07-21 DIAGNOSIS — Z170 Estrogen receptor positive status [ER+]: Principal | ICD-10-CM

## 2017-07-21 DIAGNOSIS — D61810 Antineoplastic chemotherapy induced pancytopenia: Secondary | ICD-10-CM

## 2017-07-21 DIAGNOSIS — Z95828 Presence of other vascular implants and grafts: Secondary | ICD-10-CM

## 2017-07-21 DIAGNOSIS — D631 Anemia in chronic kidney disease: Secondary | ICD-10-CM

## 2017-07-21 DIAGNOSIS — T451X5A Adverse effect of antineoplastic and immunosuppressive drugs, initial encounter: Secondary | ICD-10-CM

## 2017-07-21 DIAGNOSIS — C50511 Malignant neoplasm of lower-outer quadrant of right female breast: Secondary | ICD-10-CM

## 2017-07-21 DIAGNOSIS — N189 Chronic kidney disease, unspecified: Secondary | ICD-10-CM

## 2017-07-21 DIAGNOSIS — Z5111 Encounter for antineoplastic chemotherapy: Secondary | ICD-10-CM | POA: Diagnosis not present

## 2017-07-21 LAB — CBC WITH DIFFERENTIAL (CANCER CENTER ONLY)
BASOS ABS: 0 10*3/uL (ref 0.0–0.1)
Basophils Relative: 0 %
EOS PCT: 0 %
Eosinophils Absolute: 0 10*3/uL (ref 0.0–0.5)
HCT: 21.2 % — ABNORMAL LOW (ref 34.8–46.6)
Hemoglobin: 7.2 g/dL — ABNORMAL LOW (ref 11.6–15.9)
LYMPHS ABS: 1.1 10*3/uL (ref 0.9–3.3)
Lymphocytes Relative: 33 %
MCH: 28.9 pg (ref 25.1–34.0)
MCHC: 34.1 g/dL (ref 31.5–36.0)
MCV: 84.8 fL (ref 79.5–101.0)
Monocytes Absolute: 0.2 10*3/uL (ref 0.1–0.9)
Monocytes Relative: 6 %
NEUTROS PCT: 61 %
Neutro Abs: 2 10*3/uL (ref 1.5–6.5)
Platelet Count: 126 10*3/uL — ABNORMAL LOW (ref 145–400)
RBC: 2.5 MIL/uL — AB (ref 3.70–5.45)
RDW: 20.8 % — ABNORMAL HIGH (ref 11.2–14.5)
WBC: 3.4 10*3/uL — AB (ref 3.9–10.3)

## 2017-07-21 LAB — CMP (CANCER CENTER ONLY)
ALBUMIN: 3.6 g/dL (ref 3.5–5.0)
ALT: 67 U/L — ABNORMAL HIGH (ref 0–55)
AST: 25 U/L (ref 5–34)
Alkaline Phosphatase: 107 U/L (ref 40–150)
Anion gap: 9 (ref 3–11)
BUN: 10 mg/dL (ref 7–26)
CHLORIDE: 104 mmol/L (ref 98–109)
CO2: 25 mmol/L (ref 22–29)
Calcium: 8.4 mg/dL (ref 8.4–10.4)
Creatinine: 0.74 mg/dL (ref 0.60–1.10)
GFR, Est AFR Am: >60 mL/min (ref >60)
GFR, Estimated: >60 mL/min (ref >60)
GLUCOSE: 257 mg/dL — AB (ref 70–140)
POTASSIUM: 3.7 mmol/L (ref 3.5–5.1)
Sodium: 138 mmol/L (ref 136–145)
Total Bilirubin: 0.9 mg/dL (ref 0.2–1.2)
Total Protein: 6.6 g/dL (ref 6.4–8.3)

## 2017-07-21 LAB — SAMPLE TO BLOOD BANK

## 2017-07-21 LAB — PREPARE RBC (CROSSMATCH)

## 2017-07-21 MED ORDER — HEPARIN SOD (PORK) LOCK FLUSH 100 UNIT/ML IV SOLN
500.0000 [IU] | Freq: Once | INTRAVENOUS | Status: DC
  Filled 2017-07-21: qty 5

## 2017-07-21 MED ORDER — DIPHENHYDRAMINE HCL 25 MG PO CAPS
25.0000 mg | ORAL_CAPSULE | Freq: Once | ORAL | Status: AC
  Administered 2017-07-21: 25 mg via ORAL

## 2017-07-21 MED ORDER — SODIUM CHLORIDE 0.9 % IV SOLN
250.0000 mL | Freq: Once | INTRAVENOUS | Status: AC
  Administered 2017-07-21: 250 mL via INTRAVENOUS

## 2017-07-21 MED ORDER — HEPARIN SOD (PORK) LOCK FLUSH 100 UNIT/ML IV SOLN
500.0000 [IU] | Freq: Every day | INTRAVENOUS | Status: AC | PRN
  Administered 2017-07-21: 500 [IU]
  Filled 2017-07-21: qty 5

## 2017-07-21 MED ORDER — SODIUM CHLORIDE 0.9% FLUSH
10.0000 mL | INTRAVENOUS | Status: DC | PRN
  Administered 2017-07-21: 10 mL via INTRAVENOUS
  Filled 2017-07-21: qty 10

## 2017-07-21 MED ORDER — SODIUM CHLORIDE 0.9% FLUSH
10.0000 mL | INTRAVENOUS | Status: AC | PRN
  Administered 2017-07-21: 10 mL
  Filled 2017-07-21: qty 10

## 2017-07-21 MED ORDER — ACETAMINOPHEN 325 MG PO TABS
650.0000 mg | ORAL_TABLET | Freq: Once | ORAL | Status: AC
  Administered 2017-07-21: 650 mg via ORAL

## 2017-07-21 MED ORDER — DIPHENHYDRAMINE HCL 25 MG PO CAPS
ORAL_CAPSULE | ORAL | Status: AC
  Filled 2017-07-21: qty 1

## 2017-07-21 MED ORDER — ALTEPLASE 2 MG IJ SOLR
2.0000 mg | Freq: Once | INTRAMUSCULAR | Status: DC | PRN
  Filled 2017-07-21: qty 2

## 2017-07-21 MED ORDER — ACETAMINOPHEN 325 MG PO TABS
ORAL_TABLET | ORAL | Status: AC
Start: 2017-07-21 — End: ?
  Filled 2017-07-21: qty 2

## 2017-07-21 NOTE — Patient Instructions (Addendum)

## 2017-07-21 NOTE — Progress Notes (Signed)
Pt hg 7.2. Per Dr.Gudena, hold chemo today and administer 2 units prbc today. Reschedule c3d8 of gemza/carbo. Notified pharmacy and they are aware. Will send message to scheduling for an appt next week. Infusion RN made aware of treatment plan today.

## 2017-07-21 NOTE — Progress Notes (Signed)
Pt Hgb 7.2.  Per May, RN for Dr. Lindi Adie pt to not receive chemo treatment today.  Instead pt will receive 2 units of PRBC's

## 2017-07-22 LAB — BPAM RBC
BLOOD PRODUCT EXPIRATION DATE: 201906262359
BLOOD PRODUCT EXPIRATION DATE: 201907162359
ISSUE DATE / TIME: 201906191031
ISSUE DATE / TIME: 201906191031
UNIT TYPE AND RH: 7300
Unit Type and Rh: 7300

## 2017-07-22 LAB — TYPE AND SCREEN
ABO/RH(D): B POS
ANTIBODY SCREEN: NEGATIVE
UNIT DIVISION: 0
Unit division: 0

## 2017-07-26 ENCOUNTER — Telehealth: Payer: Self-pay | Admitting: Hematology and Oncology

## 2017-07-26 NOTE — Telephone Encounter (Signed)
Call pt re appts that were moved to 6/26 - left vm for pt re appts.

## 2017-07-28 ENCOUNTER — Other Ambulatory Visit: Payer: Self-pay | Admitting: Hematology and Oncology

## 2017-07-28 ENCOUNTER — Inpatient Hospital Stay: Payer: BLUE CROSS/BLUE SHIELD

## 2017-07-28 VITALS — BP 128/77 | HR 81 | Temp 98.5°F | Resp 16

## 2017-07-28 DIAGNOSIS — Z5111 Encounter for antineoplastic chemotherapy: Secondary | ICD-10-CM | POA: Diagnosis not present

## 2017-07-28 DIAGNOSIS — Z170 Estrogen receptor positive status [ER+]: Principal | ICD-10-CM

## 2017-07-28 DIAGNOSIS — C50511 Malignant neoplasm of lower-outer quadrant of right female breast: Secondary | ICD-10-CM

## 2017-07-28 DIAGNOSIS — Z95828 Presence of other vascular implants and grafts: Secondary | ICD-10-CM

## 2017-07-28 LAB — CMP (CANCER CENTER ONLY)
ALT: 31 U/L (ref 0–44)
AST: 17 U/L (ref 15–41)
Albumin: 3.9 g/dL (ref 3.5–5.0)
Alkaline Phosphatase: 109 U/L (ref 38–126)
Anion gap: 7 (ref 5–15)
BILIRUBIN TOTAL: 1.5 mg/dL — AB (ref 0.3–1.2)
BUN: 15 mg/dL (ref 6–20)
CHLORIDE: 105 mmol/L (ref 98–111)
CO2: 27 mmol/L (ref 22–32)
CREATININE: 0.67 mg/dL (ref 0.44–1.00)
Calcium: 8.9 mg/dL (ref 8.9–10.3)
GFR, Est AFR Am: >60 mL/min (ref >60)
GFR, Estimated: >60 mL/min (ref >60)
GLUCOSE: 153 mg/dL — AB (ref 70–99)
Potassium: 3.9 mmol/L (ref 3.5–5.1)
Sodium: 139 mmol/L (ref 135–145)
Total Protein: 7.1 g/dL (ref 6.5–8.1)

## 2017-07-28 LAB — CBC WITH DIFFERENTIAL (CANCER CENTER ONLY)
Basophils Absolute: 0.1 10*3/uL (ref 0.0–0.1)
Basophils Relative: 1 %
EOS ABS: 0.1 10*3/uL (ref 0.0–0.5)
Eosinophils Relative: 1 %
HEMATOCRIT: 30.9 % — AB (ref 34.8–46.6)
Hemoglobin: 10.6 g/dL — ABNORMAL LOW (ref 11.6–15.9)
LYMPHS ABS: 1.8 10*3/uL (ref 0.9–3.3)
Lymphocytes Relative: 26 %
MCH: 30.2 pg (ref 25.1–34.0)
MCHC: 34.4 g/dL (ref 31.5–36.0)
MCV: 87.6 fL (ref 79.5–101.0)
MONO ABS: 0.5 10*3/uL (ref 0.1–0.9)
MONOS PCT: 8 %
Neutro Abs: 4.5 10*3/uL (ref 1.5–6.5)
Neutrophils Relative %: 64 %
Platelet Count: 103 10*3/uL — ABNORMAL LOW (ref 145–400)
RBC: 3.53 MIL/uL — ABNORMAL LOW (ref 3.70–5.45)
RDW: 21.5 % — AB (ref 11.2–14.5)
WBC Count: 7 10*3/uL (ref 3.9–10.3)

## 2017-07-28 MED ORDER — PALONOSETRON HCL INJECTION 0.25 MG/5ML
INTRAVENOUS | Status: AC
Start: 2017-07-28 — End: ?
  Filled 2017-07-28: qty 5

## 2017-07-28 MED ORDER — PALONOSETRON HCL INJECTION 0.25 MG/5ML
0.2500 mg | Freq: Once | INTRAVENOUS | Status: AC
  Administered 2017-07-28: 0.25 mg via INTRAVENOUS

## 2017-07-28 MED ORDER — HEPARIN SOD (PORK) LOCK FLUSH 100 UNIT/ML IV SOLN
500.0000 [IU] | Freq: Once | INTRAVENOUS | Status: AC | PRN
  Administered 2017-07-28: 500 [IU]
  Filled 2017-07-28: qty 5

## 2017-07-28 MED ORDER — SODIUM CHLORIDE 0.9% FLUSH
10.0000 mL | INTRAVENOUS | Status: DC | PRN
  Administered 2017-07-28: 10 mL via INTRAVENOUS
  Filled 2017-07-28: qty 10

## 2017-07-28 MED ORDER — SODIUM CHLORIDE 0.9 % IV SOLN
289.6000 mg | Freq: Once | INTRAVENOUS | Status: AC
  Administered 2017-07-28: 290 mg via INTRAVENOUS
  Filled 2017-07-28: qty 29

## 2017-07-28 MED ORDER — SODIUM CHLORIDE 0.9% FLUSH
10.0000 mL | INTRAVENOUS | Status: DC | PRN
  Administered 2017-07-28: 10 mL
  Filled 2017-07-28: qty 10

## 2017-07-28 MED ORDER — SODIUM CHLORIDE 0.9 % IV SOLN
1000.0000 mg/m2 | Freq: Once | INTRAVENOUS | Status: AC
Start: 2017-07-28 — End: 2017-07-28
  Administered 2017-07-28: 2014 mg via INTRAVENOUS
  Filled 2017-07-28: qty 52.97

## 2017-07-28 MED ORDER — SODIUM CHLORIDE 0.9 % IV SOLN
Freq: Once | INTRAVENOUS | Status: AC
  Administered 2017-07-28: 13:00:00 via INTRAVENOUS

## 2017-07-28 NOTE — Patient Instructions (Signed)
[facility] Discharge Instructions for Patients Receiving Chemotherapy  Today you received the following chemotherapy agents: Gemzar, Carboplatin   To help prevent nausea and vomiting after your treatment, we encourage you to take your nausea medication as directed.   If you develop nausea and vomiting that is not controlled by your nausea medication, call the clinic.   BELOW ARE SYMPTOMS THAT SHOULD BE REPORTED IMMEDIATELY:  *FEVER GREATER THAN 100.5 F  *CHILLS WITH OR WITHOUT FEVER  NAUSEA AND VOMITING THAT IS NOT CONTROLLED WITH YOUR NAUSEA MEDICATION  *UNUSUAL SHORTNESS OF BREATH  *UNUSUAL BRUISING OR BLEEDING  TENDERNESS IN MOUTH AND THROAT WITH OR WITHOUT PRESENCE OF ULCERS  *URINARY PROBLEMS  *BOWEL PROBLEMS  UNUSUAL RASH Items with * indicate a potential emergency and should be followed up as soon as possible.  Feel free to call the clinic should you have any questions or concerns. The clinic phone number is [phone].  Please show the CHEMO ALERT CARD at check-in to the Emergency Department and triage nurse.   

## 2017-07-30 ENCOUNTER — Other Ambulatory Visit: Payer: BLUE CROSS/BLUE SHIELD

## 2017-07-30 ENCOUNTER — Ambulatory Visit: Payer: BLUE CROSS/BLUE SHIELD

## 2017-08-06 ENCOUNTER — Other Ambulatory Visit: Payer: BLUE CROSS/BLUE SHIELD

## 2017-08-06 ENCOUNTER — Ambulatory Visit: Payer: BLUE CROSS/BLUE SHIELD

## 2017-08-06 ENCOUNTER — Ambulatory Visit: Payer: BLUE CROSS/BLUE SHIELD | Admitting: Hematology and Oncology

## 2017-08-13 ENCOUNTER — Inpatient Hospital Stay: Payer: BLUE CROSS/BLUE SHIELD

## 2017-08-13 ENCOUNTER — Other Ambulatory Visit: Payer: Self-pay | Admitting: *Deleted

## 2017-08-13 ENCOUNTER — Inpatient Hospital Stay: Payer: BLUE CROSS/BLUE SHIELD | Attending: Hematology and Oncology

## 2017-08-13 ENCOUNTER — Telehealth: Payer: Self-pay | Admitting: Hematology and Oncology

## 2017-08-13 ENCOUNTER — Inpatient Hospital Stay: Payer: BLUE CROSS/BLUE SHIELD | Admitting: Lab

## 2017-08-13 ENCOUNTER — Inpatient Hospital Stay: Payer: BLUE CROSS/BLUE SHIELD | Admitting: Hematology and Oncology

## 2017-08-13 DIAGNOSIS — C50511 Malignant neoplasm of lower-outer quadrant of right female breast: Secondary | ICD-10-CM

## 2017-08-13 DIAGNOSIS — Z5111 Encounter for antineoplastic chemotherapy: Secondary | ICD-10-CM | POA: Insufficient documentation

## 2017-08-13 DIAGNOSIS — D649 Anemia, unspecified: Secondary | ICD-10-CM | POA: Diagnosis not present

## 2017-08-13 DIAGNOSIS — D580 Hereditary spherocytosis: Secondary | ICD-10-CM

## 2017-08-13 DIAGNOSIS — Z170 Estrogen receptor positive status [ER+]: Secondary | ICD-10-CM

## 2017-08-13 DIAGNOSIS — E119 Type 2 diabetes mellitus without complications: Secondary | ICD-10-CM

## 2017-08-13 DIAGNOSIS — Z95828 Presence of other vascular implants and grafts: Secondary | ICD-10-CM

## 2017-08-13 LAB — CMP (CANCER CENTER ONLY)
ALBUMIN: 3.9 g/dL (ref 3.5–5.0)
ALT: 33 U/L (ref 0–44)
AST: 19 U/L (ref 15–41)
Alkaline Phosphatase: 109 U/L (ref 38–126)
Anion gap: 8 (ref 5–15)
BILIRUBIN TOTAL: 1.2 mg/dL (ref 0.3–1.2)
BUN: 9 mg/dL (ref 6–20)
CALCIUM: 8.7 mg/dL — AB (ref 8.9–10.3)
CO2: 26 mmol/L (ref 22–32)
CREATININE: 0.72 mg/dL (ref 0.44–1.00)
Chloride: 106 mmol/L (ref 98–111)
GFR, Estimated: >60 mL/min (ref >60)
GLUCOSE: 211 mg/dL — AB (ref 70–99)
Potassium: 3.8 mmol/L (ref 3.5–5.1)
Sodium: 140 mmol/L (ref 135–145)
TOTAL PROTEIN: 7.1 g/dL (ref 6.5–8.1)

## 2017-08-13 LAB — CBC WITH DIFFERENTIAL (CANCER CENTER ONLY)
BASOS ABS: 0 10*3/uL (ref 0.0–0.1)
BASOS PCT: 0 %
EOS ABS: 0.1 10*3/uL (ref 0.0–0.5)
Eosinophils Relative: 2 %
HCT: 25.4 % — ABNORMAL LOW (ref 34.8–46.6)
Hemoglobin: 8.3 g/dL — ABNORMAL LOW (ref 11.6–15.9)
Lymphocytes Relative: 25 %
Lymphs Abs: 1.3 10*3/uL (ref 0.9–3.3)
MCH: 30 pg (ref 25.1–34.0)
MCHC: 32.7 g/dL (ref 31.5–36.0)
MCV: 91.7 fL (ref 79.5–101.0)
MONO ABS: 0.4 10*3/uL (ref 0.1–0.9)
Monocytes Relative: 7 %
NEUTROS ABS: 3.5 10*3/uL (ref 1.5–6.5)
Neutrophils Relative %: 66 %
PLATELETS: 151 10*3/uL (ref 145–400)
RBC: 2.77 MIL/uL — ABNORMAL LOW (ref 3.70–5.45)
RDW: 23.5 % — AB (ref 11.2–14.5)
WBC Count: 5.3 10*3/uL (ref 3.9–10.3)

## 2017-08-13 LAB — PREPARE RBC (CROSSMATCH)

## 2017-08-13 MED ORDER — SODIUM CHLORIDE 0.9 % IV SOLN
1000.0000 mg/m2 | Freq: Once | INTRAVENOUS | Status: AC
  Administered 2017-08-13: 2014 mg via INTRAVENOUS
  Filled 2017-08-13: qty 52.97

## 2017-08-13 MED ORDER — SODIUM CHLORIDE 0.9 % IV SOLN
289.6000 mg | Freq: Once | INTRAVENOUS | Status: AC
  Administered 2017-08-13: 290 mg via INTRAVENOUS
  Filled 2017-08-13: qty 29

## 2017-08-13 MED ORDER — PALONOSETRON HCL INJECTION 0.25 MG/5ML
INTRAVENOUS | Status: AC
  Filled 2017-08-13: qty 5

## 2017-08-13 MED ORDER — SODIUM CHLORIDE 0.9% FLUSH
10.0000 mL | Freq: Once | INTRAVENOUS | Status: AC
  Administered 2017-08-13: 10 mL
  Filled 2017-08-13: qty 10

## 2017-08-13 MED ORDER — HEPARIN SOD (PORK) LOCK FLUSH 100 UNIT/ML IV SOLN
500.0000 [IU] | Freq: Once | INTRAVENOUS | Status: AC | PRN
  Administered 2017-08-13: 500 [IU]
  Filled 2017-08-13: qty 5

## 2017-08-13 MED ORDER — PALONOSETRON HCL INJECTION 0.25 MG/5ML
0.2500 mg | Freq: Once | INTRAVENOUS | Status: AC
  Administered 2017-08-13: 0.25 mg via INTRAVENOUS

## 2017-08-13 MED ORDER — SODIUM CHLORIDE 0.9 % IV SOLN
Freq: Once | INTRAVENOUS | Status: AC
  Administered 2017-08-13: 10:00:00 via INTRAVENOUS

## 2017-08-13 MED ORDER — SODIUM CHLORIDE 0.9% FLUSH
10.0000 mL | INTRAVENOUS | Status: DC | PRN
  Administered 2017-08-13: 10 mL
  Filled 2017-08-13: qty 10

## 2017-08-13 NOTE — Assessment & Plan Note (Signed)
Right breast invasive ductal carcinoma ER/PR positive HER-2 negative stage II A. T3, N1, M0 with PTEN mutation Priortreatment: Adjuvant Zoladex and exemestane started 07/03/2013, plan is for 5 years; changed to every 3 month Zoladex injection from May 2016 ------------------------------------------------------------------------------ Breast MRI 04/22/2017: 2.3 cm malignancy lower outer quadrant right breast within the lumpectomy scar  Recommendation: 1.Neoadjuvant chemotherapy with carboplatin and gemcitabine day 1 and 8 every 3 weeks for 6 cycles 2.followed by mastectomy 3.Followed by continuedantiestrogen therapy  PET/CT scan: Negative for distant metastasis -------------------------------------------------------------------------------- Current treatment: Cycle3day 1 carboplatin and gemcitabine Chemo toxicities: 55ftigue.  2.constipation she uses MiraLAX 3.Elevated LFTs: Monitoring closely. She may have fatty liver. Diabetes mellitus: Watching her blood sugars as well.  Normocytic anemia: Patient hasspherocytosis.  Patient had hospitalization for anemia and required blood transfusion.  Monitoring closely and transfusions as needed.  Patient appears to need transfusions on day 8 of every cycle.  Return to clinic in 3 weeks for cycle 5

## 2017-08-13 NOTE — Progress Notes (Signed)
Patient Care Team: Forrest Moron, MD as PCP - General (Internal Medicine) Nicholas Lose, MD as Consulting Physician (Hematology and Oncology)  DIAGNOSIS:  Encounter Diagnosis  Name Primary?  . Malignant neoplasm of lower-outer quadrant of right breast of female, estrogen receptor positive ([place])     SUMMARY OF ONCOLOGIC HISTORY:   Breast cancer of lower-outer quadrant of right female breast (Mary Dougherty)   09/08/2012 Initial Diagnosis    Breast cancer of lower-outer quadrant of right female breast: ER 99% PR 100% HER-2 negative, Ki-67 70%      10/07/2012 - 03/17/2013 Neo-Adjuvant Chemotherapy    Dose dense FEC followed by weekly Taxol      04/06/2013 Surgery    Right breast lumpectomy residual 1.1 cm IDC grade 3 with high-grade DCIS, PNI +ve, 1/2 SLN positive, no MVI, T1 C. N1 A. stage IIA, ER 97% PR 95% HER-2 1.1 ratio negative      05/08/2013 - 06/22/2013 Radiation Therapy    Radiation therapy to lumpectomy site      07/03/2013 - 04/27/2017 Anti-estrogen oral therapy    Zoladex plus Aromasin       Procedure    Genetic testing:PTEN C.-([id number])del 26 on [employer] testing      04/15/2017 Relapse/Recurrence    Right breast biopsy: Invasive ductal carcinoma, grade 3, ER 10%, PR 0%, Ki-67 30%, HER-2 equivocal, ratio 1.36, copy #5.2, IHC 1+ negative      04/22/2017 Breast MRI    Malignancy LOQ right breast 2.3 cm within the lumpectomy scar, left breast normal      06/11/2017 - 06/12/2017 Hospital Admission    Syncope due to anemia required blood transfusion       Malignant neoplasm of lower-outer quadrant of right breast of female, estrogen receptor positive (Mary Dougherty)   04/28/2017 Initial Diagnosis    Malignant neoplasm of lower-outer quadrant of right breast of female, estrogen receptor positive (Mary Dougherty)      04/28/2017 -  Chemotherapy    The patient had palonosetron (ALOXI) injection 0.25 mg, 0.25 mg, Intravenous,  Once, 4 of 6 cycles Administration: 0.25 mg (06/02/2017), 0.25 mg  (06/09/2017), 0.25 mg (06/23/2017), 0.25 mg (07/01/2017), 0.25 mg (07/14/2017), 0.25 mg (07/28/2017) CARBOplatin (PARAPLATIN) 290 mg in sodium chloride 0.9 % 250 mL chemo infusion, 290 mg (100 % of original dose 289.6 mg), Intravenous,  Once, 4 of 6 cycles Dose modification:   (original dose 289.6 mg, Cycle 1) Administration: 290 mg (06/02/2017), 290 mg (06/09/2017), 290 mg (06/23/2017), 290 mg (07/01/2017), 290 mg (07/14/2017), 290 mg (07/28/2017) gemcitabine (GEMZAR) 2,014 mg in sodium chloride 0.9 % 250 mL chemo infusion, 1,000 mg/m2 = 2,014 mg, Intravenous,  Once, 4 of 6 cycles Administration: 2,014 mg (06/02/2017), 2,014 mg (06/09/2017), 2,014 mg (06/23/2017), 2,014 mg (07/01/2017), 2,014 mg (07/14/2017), 2,014 mg (07/28/2017)  for chemotherapy treatment.        CHIEF COMPLIANT: Cycle 5 of carbo Gemzar  INTERVAL HISTORY: Mary Dougherty is a 47 year old with above-mentioned history of recurrent right breast cancer who is currently on neoadjuvant chemotherapy with carboplatin and gemcitabine and today's cycle 5 of her treatment.  Overall she is tolerating chemo fairly well with exception of severe anemia.  She requires blood transfusion frequently.  REVIEW OF SYSTEMS:   Constitutional: Denies fevers, chills or abnormal weight loss Eyes: Denies blurriness of vision Ears, nose, mouth, throat, and face: Denies mucositis or sore throat Respiratory: Denies cough, dyspnea or wheezes Cardiovascular: Denies palpitation, chest discomfort Gastrointestinal:  Denies nausea, heartburn or change in bowel habits Skin:  Denies abnormal skin rashes Lymphatics: Denies new lymphadenopathy or easy bruising Neurological:Denies numbness, tingling or new weaknesses Behavioral/Psych: Mood is stable, no new changes  Extremities: No lower extremity edema  All other systems were reviewed with the patient and are negative.  I have reviewed the past medical history, past surgical history, social history and family history with the  patient and they are unchanged from previous note.  ALLERGIES:  has No Known Allergies.  MEDICATIONS:  Current Outpatient Medications  Medication Sig Dispense Refill  . cetirizine HCl (ZYRTEC) 5 MG/5ML SOLN Take 5 mLs (5 mg total) by mouth daily. 161 mL   . folic acid (FOLVITE) 1 MG tablet Take 1 mg by mouth daily.    . metFORMIN (GLUCOPHAGE) 500 MG tablet TAKE 1 TABLET BY MOUTH EVERY DAY WITH BREAKFAST 30 tablet 0  . ondansetron (ZOFRAN) 8 MG tablet Take 1 tablet (8 mg total) by mouth every 8 (eight) hours as needed for nausea. (Patient not taking: Reported on 06/26/2017) 30 tablet 3  . polyethylene glycol (MIRALAX / GLYCOLAX) packet Take 17 g by mouth daily as needed for mild constipation or moderate constipation.    . prochlorperazine (COMPAZINE) 10 MG tablet Take 1 tablet (10 mg total) by mouth every 6 (six) hours as needed for nausea or vomiting. (Patient not taking: Reported on 06/26/2017) 30 tablet 0   No current facility-administered medications for this visit.    Facility-Administered Medications Ordered in Other Visits  Medication Dose Route Frequency Provider Last Rate Last Dose  . CARBOplatin (PARAPLATIN) 290 mg in sodium chloride 0.9 % 250 mL chemo infusion  290 mg Intravenous Once Nicholas Lose, MD 558 mL/hr at 08/13/17 1229 290 mg at 08/13/17 1229  . heparin lock flush 100 unit/mL  500 Units Intracatheter Once PRN Nicholas Lose, MD      . sodium chloride flush (NS) 0.9 % injection 10 mL  10 mL Intracatheter PRN Nicholas Lose, MD        PHYSICAL EXAMINATION: ECOG PERFORMANCE STATUS: 1 - Symptomatic but completely ambulatory  Vitals:   08/13/17 0918  BP: (!) 132/59  Pulse: 77  Resp: 18  Temp: 98.1 F (36.7 C)  SpO2: 100%   Filed Weights   08/13/17 0918  Weight: 189 lb 9.6 oz (86 kg)    GENERAL:alert, no distress and comfortable SKIN: skin color, texture, turgor are normal, no rashes or significant lesions EYES: normal, Conjunctiva are pink and non-injected, sclera  clear OROPHARYNX:no exudate, no erythema and lips, buccal mucosa, and tongue normal  NECK: supple, thyroid normal size, non-tender, without nodularity LYMPH:  no palpable lymphadenopathy in the cervical, axillary or inguinal LUNGS: clear to auscultation and percussion with normal breathing effort HEART: regular rate & rhythm and no murmurs and no lower extremity edema ABDOMEN:abdomen soft, non-tender and normal bowel sounds MUSCULOSKELETAL:no cyanosis of digits and no clubbing  NEURO: alert & oriented x 3 with fluent speech, no focal motor/sensory deficits EXTREMITIES: No lower extremity edema  LABORATORY DATA:  I have reviewed the data as listed CMP Latest Ref Rng & Units 08/13/2017 07/28/2017 07/21/2017  Glucose 70 - 99 mg/dL 211(H) 153(H) 257(H)  BUN 6 - 20 mg/dL '9 15 10  '$ Creatinine 0.44 - 1.00 mg/dL 0.72 0.67 0.74  Sodium 135 - 145 mmol/L 140 139 138  Potassium 3.5 - 5.1 mmol/L 3.8 3.9 3.7  Chloride 98 - 111 mmol/L 106 105 104  CO2 22 - 32 mmol/L '26 27 25  '$ Calcium 8.9 - 10.3 mg/dL 8.7(L) 8.9 8.4  Total Protein 6.5 - 8.1 g/dL 7.1 7.1 6.6  Total Bilirubin 0.3 - 1.2 mg/dL 1.2 1.5(H) 0.9  Alkaline Phos 38 - 126 U/L 109 109 107  AST 15 - 41 U/L '19 17 25  '$ ALT 0 - 44 U/L 33 31 67(H)    Lab Results  Component Value Date   WBC 5.3 08/13/2017   HGB 8.3 (L) 08/13/2017   HCT 25.4 (L) 08/13/2017   MCV 91.7 08/13/2017   PLT 151 08/13/2017   NEUTROABS 3.5 08/13/2017    ASSESSMENT & PLAN:  Malignant neoplasm of lower-outer quadrant of right breast of female, estrogen receptor positive ([place]) Right breast invasive ductal carcinoma ER/PR positive HER-2 negative stage II A. T3, N1, M0 with PTEN mutation Priortreatment: Adjuvant Zoladex and exemestane started 07/03/2013, plan is for 5 years; changed to every 3 month Zoladex injection from May 2016 ------------------------------------------------------------------------------ Breast MRI 04/22/2017: 2.3 cm malignancy lower outer quadrant right  breast within the lumpectomy scar  Recommendation: 1.Neoadjuvant chemotherapy with carboplatin and gemcitabine day 1 and 8 every 3 weeks for 6 cycles 2.followed by mastectomy 3.Followed by continuedantiestrogen therapy  PET/CT scan: Negative for distant metastasis -------------------------------------------------------------------------------- Current treatment: Cycle3day 1 carboplatin and gemcitabine Chemo toxicities: 39ftigue.  2.constipation she uses MiraLAX 3.Elevated LFTs: Monitoring closely. She may have fatty liver. Diabetes mellitus: Watching her blood sugars as well.  Normocytic anemia: Patient hasspherocytosis.  Patient had hospitalization for anemia and required blood transfusion.  Monitoring closely and transfusions as needed.  Patient appears to need transfusions on day 8 of every cycle.  Return to clinic in 3 weeks for cycle 5      Orders Placed This Encounter  Procedures  . MR BREAST BILATERAL W WO CONTRAST INC CAD    Standing Status:   Future    Standing Expiration Date:   10/15/2018    Order Specific Question:   ** REASON FOR EXAM (FREE TEXT)    Answer:   Post neoadj chemo    Order Specific Question:   If indicated for the ordered procedure, I authorize the administration of contrast media per Radiology protocol    Answer:   Yes    Order Specific Question:   What is the patient's sedation requirement?    Answer:   No Sedation    Order Specific Question:   Does the patient have a pacemaker or implanted devices?    Answer:   No    Order Specific Question:   Radiology Contrast Protocol - do NOT remove file path    Answer:   [email or web address]    Order Specific Question:   [facility]    [facility]   The patient has a good understanding of the overall plan. she agrees with it. she will call with any problems that may develop before the next visit here.   VHarriette Ohara MD 08/13/17

## 2017-08-13 NOTE — Patient Instructions (Signed)
[facility] Discharge Instructions for Patients Receiving Chemotherapy  Today you received the following chemotherapy agents Gemzar and Carboplatin   To help prevent nausea and vomiting after your treatment, we encourage you to take your nausea medication as directed.    If you develop nausea and vomiting that is not controlled by your nausea medication, call the clinic.   BELOW ARE SYMPTOMS THAT SHOULD BE REPORTED IMMEDIATELY:  *FEVER GREATER THAN 100.5 F  *CHILLS WITH OR WITHOUT FEVER  NAUSEA AND VOMITING THAT IS NOT CONTROLLED WITH YOUR NAUSEA MEDICATION  *UNUSUAL SHORTNESS OF BREATH  *UNUSUAL BRUISING OR BLEEDING  TENDERNESS IN MOUTH AND THROAT WITH OR WITHOUT PRESENCE OF ULCERS  *URINARY PROBLEMS  *BOWEL PROBLEMS  UNUSUAL RASH Items with * indicate a potential emergency and should be followed up as soon as possible.  Feel free to call the clinic should you have any questions or concerns. The clinic phone number is [phone].  Please show the CHEMO ALERT CARD at check-in to the Emergency Department and triage nurse.   

## 2017-08-13 NOTE — Telephone Encounter (Signed)
Gave patient avs and calendar of upcoming July and aug appts.  °

## 2017-08-14 ENCOUNTER — Inpatient Hospital Stay: Payer: BLUE CROSS/BLUE SHIELD

## 2017-08-14 DIAGNOSIS — D649 Anemia, unspecified: Secondary | ICD-10-CM

## 2017-08-14 DIAGNOSIS — Z5111 Encounter for antineoplastic chemotherapy: Secondary | ICD-10-CM | POA: Diagnosis not present

## 2017-08-14 MED ORDER — DIPHENHYDRAMINE HCL 25 MG PO CAPS
25.0000 mg | ORAL_CAPSULE | Freq: Once | ORAL | Status: AC
  Administered 2017-08-14: 25 mg via ORAL

## 2017-08-14 MED ORDER — ACETAMINOPHEN 325 MG PO TABS
ORAL_TABLET | ORAL | Status: AC
  Filled 2017-08-14: qty 2

## 2017-08-14 MED ORDER — ACETAMINOPHEN 325 MG PO TABS
650.0000 mg | ORAL_TABLET | Freq: Once | ORAL | Status: AC
  Administered 2017-08-14: 650 mg via ORAL

## 2017-08-14 MED ORDER — SODIUM CHLORIDE 0.9 % IV SOLN
250.0000 mL | Freq: Once | INTRAVENOUS | Status: AC
  Administered 2017-08-14: 250 mL via INTRAVENOUS

## 2017-08-14 MED ORDER — SODIUM CHLORIDE 0.9% FLUSH
10.0000 mL | INTRAVENOUS | Status: AC | PRN
  Administered 2017-08-14: 10 mL
  Filled 2017-08-14: qty 10

## 2017-08-14 MED ORDER — DIPHENHYDRAMINE HCL 25 MG PO CAPS
ORAL_CAPSULE | ORAL | Status: AC
  Filled 2017-08-14: qty 1

## 2017-08-14 MED ORDER — HEPARIN SOD (PORK) LOCK FLUSH 100 UNIT/ML IV SOLN
500.0000 [IU] | Freq: Every day | INTRAVENOUS | Status: AC | PRN
  Administered 2017-08-14: 500 [IU]
  Filled 2017-08-14: qty 5

## 2017-08-14 NOTE — Patient Instructions (Signed)

## 2017-08-16 LAB — TYPE AND SCREEN
ABO/RH(D): B POS
ANTIBODY SCREEN: NEGATIVE
UNIT DIVISION: 0
UNIT DIVISION: 0

## 2017-08-16 LAB — BPAM RBC
BLOOD PRODUCT EXPIRATION DATE: 201908072359
Blood Product Expiration Date: 201908092359
ISSUE DATE / TIME: 201907130826
ISSUE DATE / TIME: 201907130826
UNIT TYPE AND RH: 7300
UNIT TYPE AND RH: 7300

## 2017-08-20 ENCOUNTER — Telehealth: Payer: Self-pay | Admitting: Hematology and Oncology

## 2017-08-20 ENCOUNTER — Other Ambulatory Visit: Payer: Self-pay | Admitting: Hematology and Oncology

## 2017-08-20 ENCOUNTER — Other Ambulatory Visit: Payer: Self-pay | Admitting: *Deleted

## 2017-08-20 ENCOUNTER — Inpatient Hospital Stay: Payer: BLUE CROSS/BLUE SHIELD | Admitting: Hematology and Oncology

## 2017-08-20 ENCOUNTER — Inpatient Hospital Stay: Payer: BLUE CROSS/BLUE SHIELD

## 2017-08-20 ENCOUNTER — Other Ambulatory Visit: Payer: Self-pay

## 2017-08-20 VITALS — BP 106/58 | HR 77 | Temp 98.5°F | Resp 16

## 2017-08-20 DIAGNOSIS — C50511 Malignant neoplasm of lower-outer quadrant of right female breast: Secondary | ICD-10-CM

## 2017-08-20 DIAGNOSIS — D649 Anemia, unspecified: Secondary | ICD-10-CM

## 2017-08-20 DIAGNOSIS — Z5111 Encounter for antineoplastic chemotherapy: Secondary | ICD-10-CM | POA: Diagnosis not present

## 2017-08-20 DIAGNOSIS — Z1589 Genetic susceptibility to other disease: Secondary | ICD-10-CM | POA: Insufficient documentation

## 2017-08-20 DIAGNOSIS — Z170 Estrogen receptor positive status [ER+]: Principal | ICD-10-CM

## 2017-08-20 DIAGNOSIS — Z95828 Presence of other vascular implants and grafts: Secondary | ICD-10-CM

## 2017-08-20 LAB — CBC WITH DIFFERENTIAL (CANCER CENTER ONLY)
BASOS ABS: 0 10*3/uL (ref 0.0–0.1)
Basophils Relative: 1 %
EOS PCT: 1 %
Eosinophils Absolute: 0 10*3/uL (ref 0.0–0.5)
HEMATOCRIT: 23.1 % — AB (ref 34.8–46.6)
Hemoglobin: 7.9 g/dL — ABNORMAL LOW (ref 11.6–15.9)
LYMPHS PCT: 40 %
Lymphs Abs: 1.2 10*3/uL (ref 0.9–3.3)
MCH: 29.4 pg (ref 25.1–34.0)
MCHC: 34.4 g/dL (ref 31.5–36.0)
MCV: 85.6 fL (ref 79.5–101.0)
MONO ABS: 0.2 10*3/uL (ref 0.1–0.9)
Monocytes Relative: 7 %
NEUTROS ABS: 1.6 10*3/uL (ref 1.5–6.5)
Neutrophils Relative %: 51 %
PLATELETS: 83 10*3/uL — AB (ref 145–400)
RBC: 2.7 MIL/uL — AB (ref 3.70–5.45)
RDW: 17.4 % — AB (ref 11.2–14.5)
WBC: 3 10*3/uL — AB (ref 3.9–10.3)

## 2017-08-20 LAB — CMP (CANCER CENTER ONLY)
ALK PHOS: 97 U/L (ref 38–126)
ALT: 70 U/L — ABNORMAL HIGH (ref 0–44)
ANION GAP: 8 (ref 5–15)
AST: 28 U/L (ref 15–41)
Albumin: 3.6 g/dL (ref 3.5–5.0)
BILIRUBIN TOTAL: 1 mg/dL (ref 0.3–1.2)
BUN: 14 mg/dL (ref 6–20)
CALCIUM: 8.6 mg/dL — AB (ref 8.9–10.3)
CO2: 26 mmol/L (ref 22–32)
Chloride: 105 mmol/L (ref 98–111)
Creatinine: 0.67 mg/dL (ref 0.44–1.00)
GFR, Est AFR Am: >60 mL/min (ref >60)
Glucose, Bld: 162 mg/dL — ABNORMAL HIGH (ref 70–99)
POTASSIUM: 3.7 mmol/L (ref 3.5–5.1)
Sodium: 139 mmol/L (ref 135–145)
TOTAL PROTEIN: 6.7 g/dL (ref 6.5–8.1)

## 2017-08-20 LAB — SAMPLE TO BLOOD BANK

## 2017-08-20 LAB — PREPARE RBC (CROSSMATCH)

## 2017-08-20 MED ORDER — HEPARIN SOD (PORK) LOCK FLUSH 100 UNIT/ML IV SOLN
500.0000 [IU] | Freq: Once | INTRAVENOUS | Status: AC | PRN
  Administered 2017-08-20: 500 [IU]
  Filled 2017-08-20: qty 5

## 2017-08-20 MED ORDER — PALONOSETRON HCL INJECTION 0.25 MG/5ML
0.2500 mg | Freq: Once | INTRAVENOUS | Status: AC
  Administered 2017-08-20: 0.25 mg via INTRAVENOUS

## 2017-08-20 MED ORDER — DIPHENHYDRAMINE HCL 25 MG PO CAPS
25.0000 mg | ORAL_CAPSULE | Freq: Once | ORAL | Status: AC
  Administered 2017-08-20: 25 mg via ORAL

## 2017-08-20 MED ORDER — PALONOSETRON HCL INJECTION 0.25 MG/5ML
INTRAVENOUS | Status: AC
  Filled 2017-08-20: qty 5

## 2017-08-20 MED ORDER — SODIUM CHLORIDE 0.9 % IV SOLN
289.6000 mg | Freq: Once | INTRAVENOUS | Status: AC
  Administered 2017-08-20: 290 mg via INTRAVENOUS
  Filled 2017-08-20: qty 29

## 2017-08-20 MED ORDER — SODIUM CHLORIDE 0.9% FLUSH
10.0000 mL | INTRAVENOUS | Status: DC | PRN
  Administered 2017-08-20: 10 mL
  Filled 2017-08-20: qty 10

## 2017-08-20 MED ORDER — SODIUM CHLORIDE 0.9 % IV SOLN
Freq: Once | INTRAVENOUS | Status: AC
  Administered 2017-08-20: 10:00:00 via INTRAVENOUS

## 2017-08-20 MED ORDER — ACETAMINOPHEN 325 MG PO TABS
ORAL_TABLET | ORAL | Status: AC
  Filled 2017-08-20: qty 2

## 2017-08-20 MED ORDER — DIPHENHYDRAMINE HCL 25 MG PO CAPS
ORAL_CAPSULE | ORAL | Status: AC
  Filled 2017-08-20: qty 1

## 2017-08-20 MED ORDER — ACETAMINOPHEN 325 MG PO TABS
650.0000 mg | ORAL_TABLET | Freq: Once | ORAL | Status: AC
  Administered 2017-08-20: 650 mg via ORAL

## 2017-08-20 MED ORDER — SODIUM CHLORIDE 0.9 % IV SOLN
1000.0000 mg/m2 | Freq: Once | INTRAVENOUS | Status: AC
  Administered 2017-08-20: 2014 mg via INTRAVENOUS
  Filled 2017-08-20: qty 52.97

## 2017-08-20 MED ORDER — SODIUM CHLORIDE 0.9% FLUSH
10.0000 mL | Freq: Once | INTRAVENOUS | Status: AC
  Administered 2017-08-20: 10 mL
  Filled 2017-08-20: qty 10

## 2017-08-20 NOTE — Telephone Encounter (Signed)
I have reviewed the blood test with the patient's nursing staff The patient would like to proceed with chemotherapy as scheduled despite pancytopenia I will authorize her to proceed with treatment but recommend a unit of blood transfusion today/tomorrow. I will order 1 unit of blood for her Given progressive pancytopenia, I recommend repeat blood count next week in case she need platelet transfusion

## 2017-08-20 NOTE — Progress Notes (Signed)
Per Dr. Alvy Bimler ok to tx with Hgb 7.9 and Plt 83. Pt to receive blood tomorrow 08/21/17.  Post vitals not obtained. Pt asymptomatic upon d/c.

## 2017-08-20 NOTE — Patient Instructions (Addendum)
[facility] Discharge Instructions for Patients Receiving Chemotherapy  Today you received the following chemotherapy agents gemcitabine (Gemzar) and carboplatin (Paraplatin)  To help prevent nausea and vomiting after your treatment, we encourage you to take your nausea medication as directed If you develop nausea and vomiting that is not controlled by your nausea medication, call the clinic.   BELOW ARE SYMPTOMS THAT SHOULD BE REPORTED IMMEDIATELY:  *FEVER GREATER THAN 100.5 F  *CHILLS WITH OR WITHOUT FEVER  NAUSEA AND VOMITING THAT IS NOT CONTROLLED WITH YOUR NAUSEA MEDICATION  *UNUSUAL SHORTNESS OF BREATH  *UNUSUAL BRUISING OR BLEEDING  TENDERNESS IN MOUTH AND THROAT WITH OR WITHOUT PRESENCE OF ULCERS  *URINARY PROBLEMS  *BOWEL PROBLEMS  UNUSUAL RASH Items with * indicate a potential emergency and should be followed up as soon as possible.  Feel free to call the clinic should you have any questions or concerns. The clinic phone number is [phone].  Please show the [place] at check-in to the Emergency Department and triage nurse.   Blood Transfusion, Care After This sheet gives you information about how to care for yourself after your procedure. Your doctor may also give you more specific instructions. If you have problems or questions, contact your doctor. Follow these instructions at home:  Take over-the-counter and prescription medicines only as told by your doctor.  Go back to your normal activities as told by your doctor.  Follow instructions from your doctor about how to take care of the area where an IV tube was put into your vein (insertion site). Make sure you: ? Wash your hands with soap and water before you change your bandage (dressing). If there is no soap and water, use hand sanitizer. ? Change your bandage as told by your doctor.  Check your IV insertion site every day for signs of infection. Check for: ? More redness,  swelling, or pain. ? More fluid or blood. ? Warmth. ? Pus or a bad smell. Contact a doctor if:  You have more redness, swelling, or pain around the IV insertion site..  You have more fluid or blood coming from the IV insertion site.  Your IV insertion site feels warm to the touch.  You have pus or a bad smell coming from the IV insertion site.  Your pee (urine) turns pink, red, or brown.  You feel weak after doing your normal activities. Get help right away if:  You have signs of a serious allergic or body defense (immune) system reaction, including: ? Itchiness. ? Hives. ? Trouble breathing. ? Anxiety. ? Pain in your chest or lower back. ? Fever, flushing, and chills. ? Fast pulse. ? Rash. ? Watery poop (diarrhea). ? Throwing up (vomiting). ? Dark pee. ? Serious headache. ? Dizziness. ? Stiff neck. ? Yellow color in your face or the white parts of your eyes (jaundice). Summary  After a blood transfusion, return to your normal activities as told by your doctor.  Every day, check for signs of infection where the IV tube was put into your vein.  Some signs of infection are warm skin, more redness and pain, more fluid or blood, and pus or a bad smell where the needle went in.  Contact your doctor if you feel weak or have any unusual symptoms. This information is not intended to replace advice given to you by your health care provider. Make sure you discuss any questions you have with your health care provider. Document Released: 02/09/2014 Document Revised: 09/13/2015  Document Reviewed: 09/13/2015 Elsevier Interactive Patient Education  2017 [employer].

## 2017-08-21 LAB — TYPE AND SCREEN
ABO/RH(D): B POS
Antibody Screen: NEGATIVE
Unit division: 0

## 2017-08-21 LAB — BPAM RBC
Blood Product Expiration Date: 201908032359
ISSUE DATE / TIME: 201907191243
Unit Type and Rh: 7300

## 2017-08-23 ENCOUNTER — Telehealth: Payer: Self-pay

## 2017-08-23 ENCOUNTER — Inpatient Hospital Stay: Payer: BLUE CROSS/BLUE SHIELD

## 2017-08-23 DIAGNOSIS — Z5111 Encounter for antineoplastic chemotherapy: Secondary | ICD-10-CM | POA: Diagnosis not present

## 2017-08-23 DIAGNOSIS — D649 Anemia, unspecified: Secondary | ICD-10-CM

## 2017-08-23 LAB — CBC WITH DIFFERENTIAL (CANCER CENTER ONLY)
BASOS PCT: 1 %
Basophils Absolute: 0 10*3/uL (ref 0.0–0.1)
EOS PCT: 2 %
Eosinophils Absolute: 0.1 10*3/uL (ref 0.0–0.5)
HCT: 27.2 % — ABNORMAL LOW (ref 34.8–46.6)
Hemoglobin: 9.3 g/dL — ABNORMAL LOW (ref 11.6–15.9)
Lymphocytes Relative: 28 %
Lymphs Abs: 0.9 10*3/uL (ref 0.9–3.3)
MCH: 28.9 pg (ref 25.1–34.0)
MCHC: 34.2 g/dL (ref 31.5–36.0)
MCV: 84.5 fL (ref 79.5–101.0)
MONO ABS: 0.1 10*3/uL (ref 0.1–0.9)
Monocytes Relative: 3 %
Neutro Abs: 2.1 10*3/uL (ref 1.5–6.5)
Neutrophils Relative %: 66 %
PLATELETS: 75 10*3/uL — AB (ref 145–400)
RBC: 3.22 MIL/uL — ABNORMAL LOW (ref 3.70–5.45)
RDW: 15.5 % — AB (ref 11.2–14.5)
WBC Count: 3.1 10*3/uL — ABNORMAL LOW (ref 3.9–10.3)

## 2017-08-23 NOTE — Telephone Encounter (Signed)
Pt states that she is on her way to see her surgeon and would like to know if she will need to have platelet transfusion this week. Pt received 1 unit prbc last Friday, after her chemo. Pt labs rechecked today and shows with hg 9.3/27.2. Platelets lower at 75. Per Dr.Gorsuch, pt does not need any transfusion unless pt is symptomatic. Pt reports she feels fine and would like to just wait and see how she feels. Pt has an off week this week and will return next week for chemo. Pt will call if she has any further concerns.

## 2017-09-02 ENCOUNTER — Other Ambulatory Visit: Payer: Self-pay | Admitting: *Deleted

## 2017-09-02 ENCOUNTER — Telehealth: Payer: Self-pay | Admitting: *Deleted

## 2017-09-02 DIAGNOSIS — C50511 Malignant neoplasm of lower-outer quadrant of right female breast: Secondary | ICD-10-CM

## 2017-09-02 DIAGNOSIS — C50911 Malignant neoplasm of unspecified site of right female breast: Secondary | ICD-10-CM

## 2017-09-02 DIAGNOSIS — Z170 Estrogen receptor positive status [ER+]: Principal | ICD-10-CM

## 2017-09-02 DIAGNOSIS — D649 Anemia, unspecified: Secondary | ICD-10-CM

## 2017-09-02 HISTORY — DX: Malignant neoplasm of unspecified site of right female breast: C50.911

## 2017-09-02 NOTE — Telephone Encounter (Signed)
This RN per call from nurse manager who was reviewing appointments for this Saturday- noted pt is scheduled for blood transfusion- no noted orders for TXX or blood.  Per chart review - pt is scheduled for chemo tomorrow post MD visit with blood if needed for Saturday.  This RN entered appropriate orders per above.

## 2017-09-03 ENCOUNTER — Inpatient Hospital Stay: Payer: BLUE CROSS/BLUE SHIELD | Admitting: Hematology and Oncology

## 2017-09-03 ENCOUNTER — Inpatient Hospital Stay: Payer: BLUE CROSS/BLUE SHIELD

## 2017-09-03 ENCOUNTER — Other Ambulatory Visit: Payer: Self-pay

## 2017-09-03 ENCOUNTER — Inpatient Hospital Stay: Payer: BLUE CROSS/BLUE SHIELD | Attending: Hematology and Oncology

## 2017-09-03 DIAGNOSIS — E119 Type 2 diabetes mellitus without complications: Secondary | ICD-10-CM

## 2017-09-03 DIAGNOSIS — C50511 Malignant neoplasm of lower-outer quadrant of right female breast: Secondary | ICD-10-CM

## 2017-09-03 DIAGNOSIS — D709 Neutropenia, unspecified: Secondary | ICD-10-CM | POA: Insufficient documentation

## 2017-09-03 DIAGNOSIS — Z170 Estrogen receptor positive status [ER+]: Secondary | ICD-10-CM | POA: Diagnosis not present

## 2017-09-03 DIAGNOSIS — Z95828 Presence of other vascular implants and grafts: Secondary | ICD-10-CM

## 2017-09-03 DIAGNOSIS — K5900 Constipation, unspecified: Secondary | ICD-10-CM | POA: Diagnosis not present

## 2017-09-03 DIAGNOSIS — Z5111 Encounter for antineoplastic chemotherapy: Secondary | ICD-10-CM | POA: Diagnosis present

## 2017-09-03 DIAGNOSIS — R5383 Other fatigue: Secondary | ICD-10-CM | POA: Insufficient documentation

## 2017-09-03 DIAGNOSIS — D696 Thrombocytopenia, unspecified: Secondary | ICD-10-CM | POA: Insufficient documentation

## 2017-09-03 DIAGNOSIS — D580 Hereditary spherocytosis: Secondary | ICD-10-CM

## 2017-09-03 DIAGNOSIS — D649 Anemia, unspecified: Secondary | ICD-10-CM | POA: Diagnosis not present

## 2017-09-03 DIAGNOSIS — R109 Unspecified abdominal pain: Secondary | ICD-10-CM | POA: Insufficient documentation

## 2017-09-03 LAB — CMP (CANCER CENTER ONLY)
ALT: 42 U/L (ref 0–44)
ANION GAP: 9 (ref 5–15)
AST: 21 U/L (ref 15–41)
Albumin: 3.6 g/dL (ref 3.5–5.0)
Alkaline Phosphatase: 119 U/L (ref 38–126)
BUN: 8 mg/dL (ref 6–20)
CHLORIDE: 104 mmol/L (ref 98–111)
CO2: 27 mmol/L (ref 22–32)
Calcium: 8.6 mg/dL — ABNORMAL LOW (ref 8.9–10.3)
Creatinine: 0.76 mg/dL (ref 0.44–1.00)
Glucose, Bld: 214 mg/dL — ABNORMAL HIGH (ref 70–99)
POTASSIUM: 3.9 mmol/L (ref 3.5–5.1)
Sodium: 140 mmol/L (ref 135–145)
Total Bilirubin: 0.8 mg/dL (ref 0.3–1.2)
Total Protein: 6.9 g/dL (ref 6.5–8.1)

## 2017-09-03 LAB — CBC WITH DIFFERENTIAL (CANCER CENTER ONLY)
BASOS ABS: 0 10*3/uL (ref 0.0–0.1)
Basophils Relative: 1 %
EOS PCT: 1 %
Eosinophils Absolute: 0.1 10*3/uL (ref 0.0–0.5)
HCT: 22.1 % — ABNORMAL LOW (ref 34.8–46.6)
Hemoglobin: 7.7 g/dL — ABNORMAL LOW (ref 11.6–15.9)
LYMPHS PCT: 32 %
Lymphs Abs: 1.2 10*3/uL (ref 0.9–3.3)
MCH: 31 pg (ref 25.1–34.0)
MCHC: 34.8 g/dL (ref 31.5–36.0)
MCV: 89 fL (ref 79.5–101.0)
MONO ABS: 0.3 10*3/uL (ref 0.1–0.9)
Monocytes Relative: 7 %
Neutro Abs: 2.2 10*3/uL (ref 1.5–6.5)
Neutrophils Relative %: 59 %
PLATELETS: 159 10*3/uL (ref 145–400)
RBC: 2.48 MIL/uL — ABNORMAL LOW (ref 3.70–5.45)
RDW: 15.2 % — AB (ref 11.2–14.5)
WBC Count: 3.7 10*3/uL — ABNORMAL LOW (ref 3.9–10.3)

## 2017-09-03 LAB — SAMPLE TO BLOOD BANK

## 2017-09-03 LAB — PREPARE RBC (CROSSMATCH)

## 2017-09-03 MED ORDER — PALONOSETRON HCL INJECTION 0.25 MG/5ML
INTRAVENOUS | Status: AC
  Filled 2017-09-03: qty 5

## 2017-09-03 MED ORDER — SODIUM CHLORIDE 0.9 % IV SOLN
Freq: Once | INTRAVENOUS | Status: AC
  Administered 2017-09-03: 12:00:00 via INTRAVENOUS
  Filled 2017-09-03: qty 250

## 2017-09-03 MED ORDER — HEPARIN SOD (PORK) LOCK FLUSH 100 UNIT/ML IV SOLN
500.0000 [IU] | Freq: Once | INTRAVENOUS | Status: AC | PRN
Start: 2017-09-03 — End: 2017-09-03
  Administered 2017-09-03: 500 [IU]
  Filled 2017-09-03: qty 5

## 2017-09-03 MED ORDER — SODIUM CHLORIDE 0.9 % IV SOLN
289.6000 mg | Freq: Once | INTRAVENOUS | Status: AC
  Administered 2017-09-03: 290 mg via INTRAVENOUS
  Filled 2017-09-03: qty 29

## 2017-09-03 MED ORDER — SODIUM CHLORIDE 0.9% FLUSH
10.0000 mL | INTRAVENOUS | Status: DC | PRN
  Administered 2017-09-03: 10 mL
  Filled 2017-09-03: qty 10

## 2017-09-03 MED ORDER — PALONOSETRON HCL INJECTION 0.25 MG/5ML
0.2500 mg | Freq: Once | INTRAVENOUS | Status: AC
  Administered 2017-09-03: 0.25 mg via INTRAVENOUS

## 2017-09-03 MED ORDER — SODIUM CHLORIDE 0.9 % IV SOLN
1000.0000 mg/m2 | Freq: Once | INTRAVENOUS | Status: AC
  Administered 2017-09-03: 2014 mg via INTRAVENOUS
  Filled 2017-09-03: qty 52.97

## 2017-09-03 MED ORDER — SODIUM CHLORIDE 0.9% FLUSH
10.0000 mL | Freq: Once | INTRAVENOUS | Status: AC
  Administered 2017-09-03: 10 mL
  Filled 2017-09-03: qty 10

## 2017-09-03 NOTE — Assessment & Plan Note (Signed)
Right breast invasive ductal carcinoma ER/PR positive HER-2 negative stage II A. T3, N1, M0 with PTEN mutation Priortreatment: Adjuvant Zoladex and exemestane started 07/03/2013, plan is for 5 years; changed to every 3 month Zoladex injection from May 2016 ------------------------------------------------------------------------------ Breast MRI 04/22/2017: 2.3 cm malignancy lower outer quadrant right breast within the lumpectomy scar  Recommendation: 1.Neoadjuvant chemotherapy with carboplatin and gemcitabine day 1 and 8 every 3 weeks for 6 cycles 2.followed by mastectomy 3.Followed by continuedantiestrogen therapy  PET/CT scan: Negative for distant metastasis -------------------------------------------------------------------------------- Current treatment: Cycle5day 1 carboplatin and gemcitabine  Chemo toxicities: 56ftigue.  2.constipation she uses MiraLAX 3.Elevated LFTs: Monitoring closely. She may have fatty liver. 4. Diabetes mellitus: Watching her blood sugars as well. 5.  Severe anemia: Requiring blood transfusions around the 8 of each cycle.  Normocytic anemia: Patient hasspherocytosis, transfusions as needed  Return to clinic in 3 weeks for cycle 6

## 2017-09-03 NOTE — Patient Instructions (Signed)
[facility] Discharge Instructions for Patients Receiving Chemotherapy  Today you received the following chemotherapy agents: Gemcitabine (Gemzar) and Carboplatin (Paraplatin)  To help prevent nausea and vomiting after your treatment, we encourage you to take your nausea medication as prescribed. Received Aloxi during treatment today-->Take Compazine (not Zofran) for the next 3 days as needed.    If you develop nausea and vomiting that is not controlled by your nausea medication, call the clinic.   BELOW ARE SYMPTOMS THAT SHOULD BE REPORTED IMMEDIATELY:  *FEVER GREATER THAN 100.5 F  *CHILLS WITH OR WITHOUT FEVER  NAUSEA AND VOMITING THAT IS NOT CONTROLLED WITH YOUR NAUSEA MEDICATION  *UNUSUAL SHORTNESS OF BREATH  *UNUSUAL BRUISING OR BLEEDING  TENDERNESS IN MOUTH AND THROAT WITH OR WITHOUT PRESENCE OF ULCERS  *URINARY PROBLEMS  *BOWEL PROBLEMS  UNUSUAL RASH Items with * indicate a potential emergency and should be followed up as soon as possible.  Feel free to call the clinic should you have any questions or concerns. The clinic phone number is [phone].  Please show the [place] at check-in to the Emergency Department and triage nurse.

## 2017-09-03 NOTE — Progress Notes (Signed)
Patient Care Team: Forrest Moron, MD as PCP - General (Internal Medicine) Nicholas Lose, MD as Consulting Physician (Hematology and Oncology)  DIAGNOSIS:  Encounter Diagnosis  Name Primary?  . Malignant neoplasm of lower-outer quadrant of right breast of female, estrogen receptor positive ([place])     SUMMARY OF ONCOLOGIC HISTORY:   Breast cancer of lower-outer quadrant of right female breast ([place])   09/08/2012 Initial Diagnosis    Breast cancer of lower-outer quadrant of right female breast: ER 99% PR 100% HER-2 negative, Ki-67 70%      10/07/2012 - 03/17/2013 Neo-Adjuvant Chemotherapy    Dose dense FEC followed by weekly Taxol      04/06/2013 Surgery    Right breast lumpectomy residual 1.1 cm IDC grade 3 with high-grade DCIS, PNI +ve, 1/2 SLN positive, no MVI, T1 C. N1 A. stage IIA, ER 97% PR 95% HER-2 1.1 ratio negative      05/08/2013 - 06/22/2013 Radiation Therapy    Radiation therapy to lumpectomy site      07/03/2013 - 04/27/2017 Anti-estrogen oral therapy    Zoladex plus Aromasin       Procedure    Genetic testing:PTEN C.-([id number])del 26 on [employer] testing      04/15/2017 Relapse/Recurrence    Right breast biopsy: Invasive ductal carcinoma, grade 3, ER 10%, PR 0%, Ki-67 30%, HER-2 equivocal, ratio 1.36, copy #5.2, IHC 1+ negative      04/22/2017 Breast MRI    Malignancy LOQ right breast 2.3 cm within the lumpectomy scar, left breast normal      06/02/2017 -  Neo-Adjuvant Chemotherapy    Carboplatin and gemcitabine neoadjuvant chemotherapy      06/11/2017 - 06/12/2017 Hospital Admission    Syncope due to anemia required blood transfusion       Malignant neoplasm of lower-outer quadrant of right breast of female, estrogen receptor positive ([place])    CHIEF COMPLIANT: Follow-up of neoadjuvant chemotherapy cycle 5 carboplatin and gemcitabine  INTERVAL HISTORY: BELLANIE MATTHEW is a 47 year old with above-mentioned history of recurrent breast cancer is currently  neoadjuvant chemotherapy with carboplatin and gemcitabine.  She appears to be tolerating the treatment fairly well.  She does have fatigue and she has required blood transfusions with each of her cycles of chemotherapy.  She is scheduled for blood transfusion tomorrow.  She denies any nausea or vomiting.  She met with plastic surgery and they felt that she needs to see a different plastic surgeon because Dr. Iran Planas does not do the rectus abdominis flaps.  REVIEW OF SYSTEMS:   Constitutional: Denies fevers, chills or abnormal weight loss Eyes: Denies blurriness of vision Ears, nose, mouth, throat, and face: Denies mucositis or sore throat Respiratory: Denies cough, dyspnea or wheezes Cardiovascular: Denies palpitation, chest discomfort Gastrointestinal:  Denies nausea, heartburn or change in bowel habits Skin: Denies abnormal skin rashes Lymphatics: Denies new lymphadenopathy or easy bruising Neurological:Denies numbness, tingling or new weaknesses Behavioral/Psych: Mood is stable, no new changes  Extremities: No lower extremity edema   All other systems were reviewed with the patient and are negative.  I have reviewed the past medical history, past surgical history, social history and family history with the patient and they are unchanged from previous note.  ALLERGIES:  has No Known Allergies.  MEDICATIONS:  Current Outpatient Medications  Medication Sig Dispense Refill  . cetirizine HCl (ZYRTEC) 5 MG/5ML SOLN Take 5 mLs (5 mg total) by mouth daily. 716 mL   . folic acid (FOLVITE) 1 MG tablet Take  1 mg by mouth daily.    . metFORMIN (GLUCOPHAGE) 500 MG tablet TAKE 1 TABLET BY MOUTH EVERY DAY WITH BREAKFAST 30 tablet 0  . ondansetron (ZOFRAN) 8 MG tablet Take 1 tablet (8 mg total) by mouth every 8 (eight) hours as needed for nausea. (Patient not taking: Reported on 06/26/2017) 30 tablet 3  . polyethylene glycol (MIRALAX / GLYCOLAX) packet Take 17 g by mouth daily as needed for mild  constipation or moderate constipation.    . prochlorperazine (COMPAZINE) 10 MG tablet Take 1 tablet (10 mg total) by mouth every 6 (six) hours as needed for nausea or vomiting. (Patient not taking: Reported on 06/26/2017) 30 tablet 0   No current facility-administered medications for this visit.     PHYSICAL EXAMINATION: ECOG PERFORMANCE STATUS: 1 - Symptomatic but completely ambulatory  Vitals:   09/03/17 1045  BP: 116/63  Pulse: 72  Resp: 18  Temp: 98.4 F (36.9 C)  SpO2: 100%   Filed Weights   09/03/17 1045  Weight: 192 lb 1.6 oz (87.1 kg)    GENERAL:alert, no distress and comfortable SKIN: skin color, texture, turgor are normal, no rashes or significant lesions EYES: normal, Conjunctiva are pink and non-injected, sclera clear OROPHARYNX:no exudate, no erythema and lips, buccal mucosa, and tongue normal  NECK: supple, thyroid normal size, non-tender, without nodularity LYMPH:  no palpable lymphadenopathy in the cervical, axillary or inguinal LUNGS: clear to auscultation and percussion with normal breathing effort HEART: regular rate & rhythm and no murmurs and no lower extremity edema ABDOMEN:abdomen soft, non-tender and normal bowel sounds MUSCULOSKELETAL:no cyanosis of digits and no clubbing  NEURO: alert & oriented x 3 with fluent speech, no focal motor/sensory deficits EXTREMITIES: No lower extremity edema   LABORATORY DATA:  I have reviewed the data as listed CMP Latest Ref Rng & Units 09/03/2017 08/20/2017 08/13/2017  Glucose 70 - 99 mg/dL 214(H) 162(H) 211(H)  BUN 6 - 20 mg/dL '8 14 9  '$ Creatinine 0.44 - 1.00 mg/dL 0.76 0.67 0.72  Sodium 135 - 145 mmol/L 140 139 140  Potassium 3.5 - 5.1 mmol/L 3.9 3.7 3.8  Chloride 98 - 111 mmol/L 104 105 106  CO2 22 - 32 mmol/L '27 26 26  '$ Calcium 8.9 - 10.3 mg/dL 8.6(L) 8.6(L) 8.7(L)  Total Protein 6.5 - 8.1 g/dL 6.9 6.7 7.1  Total Bilirubin 0.3 - 1.2 mg/dL 0.8 1.0 1.2  Alkaline Phos 38 - 126 U/L 119 97 109  AST 15 - 41 U/L '21 28  19  '$ ALT 0 - 44 U/L 42 70(H) 33    Lab Results  Component Value Date   WBC 3.7 (L) 09/03/2017   HGB 7.7 (L) 09/03/2017   HCT 22.1 (L) 09/03/2017   MCV 89.0 09/03/2017   PLT 159 09/03/2017   NEUTROABS 2.2 09/03/2017    ASSESSMENT & PLAN:  Malignant neoplasm of lower-outer quadrant of right breast of female, estrogen receptor positive ([facility]) Right breast invasive ductal carcinoma ER/PR positive HER-2 negative stage II A. T3, N1, M0 with PTEN mutation Priortreatment: Adjuvant Zoladex and exemestane started 07/03/2013, plan is for 5 years; changed to every 3 month Zoladex injection from May 2016 ------------------------------------------------------------------------------ Breast MRI 04/22/2017: 2.3 cm malignancy lower outer quadrant right breast within the lumpectomy scar  Recommendation: 1.Neoadjuvant chemotherapy with carboplatin and gemcitabine day 1 and 8 every 3 weeks for 6 cycles 2.followed by mastectomy 3.Followed by continuedantiestrogen therapy  PET/CT scan: Negative for distant metastasis -------------------------------------------------------------------------------- Current treatment: Cycle5day 1 carboplatin and gemcitabine  Chemo  toxicities: 53ftigue.  2.constipation she uses MiraLAX 3.Elevated LFTs: Monitoring closely. She may have fatty liver. 4. Diabetes mellitus: Watching her blood sugars as well. 5.  Severe anemia: Requiring blood transfusions around the 8 of each cycle.  Normocytic anemia: Patient hasspherocytosis, transfusions as needed She will receive 2 units of blood transfusion tomorrow and she will also be set up for blood transfusion on 10/02/2017.  Return to clinic in 3 weeks for cycle 6   No orders of the defined types were placed in this encounter.  The patient has a good understanding of the overall plan. she agrees with it. she will call with any problems that may develop before the next visit here.   VHarriette Ohara  MD 09/03/17

## 2017-09-04 ENCOUNTER — Inpatient Hospital Stay: Payer: BLUE CROSS/BLUE SHIELD

## 2017-09-04 DIAGNOSIS — Z5111 Encounter for antineoplastic chemotherapy: Secondary | ICD-10-CM | POA: Diagnosis not present

## 2017-09-04 DIAGNOSIS — D649 Anemia, unspecified: Secondary | ICD-10-CM

## 2017-09-04 MED ORDER — ACETAMINOPHEN 325 MG PO TABS
650.0000 mg | ORAL_TABLET | Freq: Once | ORAL | Status: AC
  Administered 2017-09-04: 650 mg via ORAL

## 2017-09-04 MED ORDER — DIPHENHYDRAMINE HCL 25 MG PO CAPS
ORAL_CAPSULE | ORAL | Status: AC
  Filled 2017-09-04: qty 1

## 2017-09-04 MED ORDER — ACETAMINOPHEN 325 MG PO TABS
ORAL_TABLET | ORAL | Status: AC
  Filled 2017-09-04: qty 2

## 2017-09-04 MED ORDER — SODIUM CHLORIDE 0.9% FLUSH
10.0000 mL | INTRAVENOUS | Status: AC | PRN
  Administered 2017-09-04: 10 mL
  Filled 2017-09-04: qty 10

## 2017-09-04 MED ORDER — SODIUM CHLORIDE 0.9% IV SOLUTION
250.0000 mL | Freq: Once | INTRAVENOUS | Status: AC
  Administered 2017-09-04: 250 mL via INTRAVENOUS
  Filled 2017-09-04: qty 250

## 2017-09-04 MED ORDER — HEPARIN SOD (PORK) LOCK FLUSH 100 UNIT/ML IV SOLN
500.0000 [IU] | Freq: Every day | INTRAVENOUS | Status: AC | PRN
  Administered 2017-09-04: 500 [IU]
  Filled 2017-09-04: qty 5

## 2017-09-04 MED ORDER — DIPHENHYDRAMINE HCL 25 MG PO CAPS
25.0000 mg | ORAL_CAPSULE | Freq: Once | ORAL | Status: AC
  Administered 2017-09-04: 25 mg via ORAL

## 2017-09-04 NOTE — Patient Instructions (Signed)

## 2017-09-06 ENCOUNTER — Telehealth: Payer: Self-pay | Admitting: Hematology and Oncology

## 2017-09-06 LAB — TYPE AND SCREEN
ABO/RH(D): B POS
Antibody Screen: NEGATIVE
UNIT DIVISION: 0
Unit division: 0

## 2017-09-06 LAB — BPAM RBC
Blood Product Expiration Date: 201908302359
Blood Product Expiration Date: 201908302359
ISSUE DATE / TIME: 201908030813
ISSUE DATE / TIME: 201908030813
UNIT TYPE AND RH: 7300
UNIT TYPE AND RH: 7300

## 2017-09-06 NOTE — Telephone Encounter (Signed)
Left message for patient regarding upcoming aug appts

## 2017-09-10 ENCOUNTER — Inpatient Hospital Stay: Payer: BLUE CROSS/BLUE SHIELD

## 2017-09-10 ENCOUNTER — Inpatient Hospital Stay (HOSPITAL_BASED_OUTPATIENT_CLINIC_OR_DEPARTMENT_OTHER): Payer: BLUE CROSS/BLUE SHIELD | Admitting: Medical

## 2017-09-10 VITALS — BP 126/85 | HR 65 | Temp 98.2°F | Resp 18

## 2017-09-10 DIAGNOSIS — C50511 Malignant neoplasm of lower-outer quadrant of right female breast: Secondary | ICD-10-CM

## 2017-09-10 DIAGNOSIS — Z95828 Presence of other vascular implants and grafts: Secondary | ICD-10-CM

## 2017-09-10 DIAGNOSIS — K5900 Constipation, unspecified: Secondary | ICD-10-CM

## 2017-09-10 DIAGNOSIS — Z170 Estrogen receptor positive status [ER+]: Secondary | ICD-10-CM | POA: Diagnosis not present

## 2017-09-10 DIAGNOSIS — R109 Unspecified abdominal pain: Secondary | ICD-10-CM

## 2017-09-10 DIAGNOSIS — Z5111 Encounter for antineoplastic chemotherapy: Secondary | ICD-10-CM | POA: Diagnosis not present

## 2017-09-10 LAB — CBC WITH DIFFERENTIAL (CANCER CENTER ONLY)
BASOS ABS: 0 10*3/uL (ref 0.0–0.1)
BASOS PCT: 1 %
EOS PCT: 1 %
Eosinophils Absolute: 0 10*3/uL (ref 0.0–0.5)
HCT: 24.3 % — ABNORMAL LOW (ref 34.8–46.6)
Hemoglobin: 8.4 g/dL — ABNORMAL LOW (ref 11.6–15.9)
LYMPHS PCT: 45 %
Lymphs Abs: 1 10*3/uL (ref 0.9–3.3)
MCH: 29 pg (ref 25.1–34.0)
MCHC: 34.6 g/dL (ref 31.5–36.0)
MCV: 83.8 fL (ref 79.5–101.0)
MONO ABS: 0.2 10*3/uL (ref 0.1–0.9)
Monocytes Relative: 9 %
NEUTROS ABS: 0.9 10*3/uL — AB (ref 1.5–6.5)
Neutrophils Relative %: 44 %
PLATELETS: 63 10*3/uL — AB (ref 145–400)
RBC: 2.9 MIL/uL — ABNORMAL LOW (ref 3.70–5.45)
RDW: 15.6 % — AB (ref 11.2–14.5)
WBC: 2.1 10*3/uL — AB (ref 3.9–10.3)

## 2017-09-10 LAB — CMP (CANCER CENTER ONLY)
ALT: 168 U/L — ABNORMAL HIGH (ref 0–44)
AST: 54 U/L — ABNORMAL HIGH (ref 15–41)
Albumin: 3.7 g/dL (ref 3.5–5.0)
Alkaline Phosphatase: 116 U/L (ref 38–126)
Anion gap: 9 (ref 5–15)
BILIRUBIN TOTAL: 1 mg/dL (ref 0.3–1.2)
BUN: 9 mg/dL (ref 6–20)
CHLORIDE: 105 mmol/L (ref 98–111)
CO2: 25 mmol/L (ref 22–32)
CREATININE: 0.65 mg/dL (ref 0.44–1.00)
Calcium: 8.5 mg/dL — ABNORMAL LOW (ref 8.9–10.3)
GFR, Estimated: >60 mL/min (ref >60)
Glucose, Bld: 167 mg/dL — ABNORMAL HIGH (ref 70–99)
POTASSIUM: 4 mmol/L (ref 3.5–5.1)
Sodium: 139 mmol/L (ref 135–145)
TOTAL PROTEIN: 7 g/dL (ref 6.5–8.1)

## 2017-09-10 MED ORDER — HEPARIN SOD (PORK) LOCK FLUSH 100 UNIT/ML IV SOLN
500.0000 [IU] | Freq: Once | INTRAVENOUS | Status: AC
  Administered 2017-09-10: 500 [IU]
  Filled 2017-09-10: qty 5

## 2017-09-10 MED ORDER — TBO-FILGRASTIM 480 MCG/0.8ML ~~LOC~~ SOSY
PREFILLED_SYRINGE | SUBCUTANEOUS | Status: AC
  Filled 2017-09-10: qty 0.8

## 2017-09-10 MED ORDER — TBO-FILGRASTIM 480 MCG/0.8ML ~~LOC~~ SOSY
480.0000 ug | PREFILLED_SYRINGE | Freq: Once | SUBCUTANEOUS | Status: AC
  Administered 2017-09-10: 480 ug via SUBCUTANEOUS

## 2017-09-10 MED ORDER — SODIUM CHLORIDE 0.9% FLUSH
10.0000 mL | Freq: Once | INTRAVENOUS | Status: AC
  Administered 2017-09-10: 10 mL
  Filled 2017-09-10: qty 10

## 2017-09-10 MED ORDER — GOSERELIN ACETATE 10.8 MG ~~LOC~~ IMPL
10.8000 mg | DRUG_IMPLANT | Freq: Once | SUBCUTANEOUS | Status: AC
  Administered 2017-09-10: 10.8 mg via SUBCUTANEOUS
  Filled 2017-09-10: qty 10.8

## 2017-09-10 MED ORDER — GOSERELIN ACETATE 3.6 MG ~~LOC~~ IMPL
DRUG_IMPLANT | SUBCUTANEOUS | Status: AC
  Filled 2017-09-10: qty 3.6

## 2017-09-10 NOTE — Patient Instructions (Signed)
Neutropenia Neutropenia is a condition that occurs when you have a lower-than-normal level of a type of white blood cell (neutrophil) in your body. Neutrophils are made in the spongy center of large bones (bone marrow) and they fight infections. Neutrophils are your body's main defense against bacterial and fungal infections. The fewer neutrophils you have and the longer your body remains without them, the greater your risk of getting a severe infection. What are the causes? This condition can occur if your body uses up or destroys neutrophils faster than your bone marrow can make them. This problem may happen because of:  Bacterial or fungal infection.  Allergic disorders.  Reactions to some medicines.  Autoimmune disease.  An enlarged spleen.  This condition can also occur if your bone marrow does not produce enough neutrophils. This problem may be caused by:  Cancer.  Cancer treatments, such as radiation or chemotherapy.  Viral infections.  Medicines, such as phenytoin.  Vitamin B12 deficiency.  Diseases of the bone marrow.  Environmental toxins, such as insecticides.  What are the signs or symptoms? This condition does not usually cause symptoms. If symptoms are present, they are usually caused by an underlying infection. Symptoms of an infection may include:  Fever.  Chills.  Swollen glands.  Oral or anal ulcers.  Cough and shortness of breath.  Rash.  Skin infection.  Fatigue.  How is this diagnosed? Your health care provider may suspect neutropenia if you have:  A condition that may cause neutropenia.  Symptoms of infection, especially fever.  Frequent and unusual infections.  You will have a medical history and physical exam. Tests will also be done, such as:  A complete blood count (CBC).  A procedure to collect a sample of bone marrow for examination (bone marrow biopsy).  A chest X-ray.  A urine culture.  A blood culture.  How is this  treated? Treatment depends on the underlying cause and severity of your condition. Mild neutropenia may not require treatment. Treatment may include medicines, such as:  Antibiotic medicine given through an IV tube.  Antiviral medicines.  Antifungal medicines.  A medicine to increase neutrophil production (colony-stimulating factor). You may get this drug through an IV tube or by injection.  Steroids given through an IV tube.  If an underlying condition is causing neutropenia, you may need treatment for that condition. If medicines you are taking are causing neutropenia, your health care provider may have you stop taking those medicines. Follow these instructions at home: Medicines  Take over-the-counter and prescription medicines only as told by your health care provider.  Get a seasonal flu shot (influenza vaccine). Lifestyle  Do not eat unpasteurized foods.Do not eat unwashed raw fruits or vegetables.  Avoid exposure to groups of people or children.  Avoid being around people who are sick.  Avoid being around dirt or dust, such as in construction areas or gardens.  Do not provide direct care for pets. Avoid animal droppings. Do not clean litter boxes and bird cages. Hygiene   Bathe daily.  Clean the area between the genitals and the anus (perineal area) after you urinate or have a bowel movement. If you are female, wipe from front to back.  Brush your teeth with a soft toothbrush before and after meals.  Do not use a razor that has a blade. Use an electric razor to remove hair.  Wash your hands often. Make sure others who come in contact with you also wash their hands. If soap and water  are not available, use hand sanitizer. General instructions  Do not have sex unless your health care provider has approved.  Take actions to avoid cuts and burns. For example: ? Be cautious when you use knives. Always cut away from yourself. ? Keep knives in protective sheaths or  guards when not in use. ? Use oven mitts when you cook with a hot stove, oven, or grill. ? Stand a safe distance away from open fires.  Avoid people who received a vaccine in the past 30 days if that vaccine contained a live version of the germ (live vaccine). You should not get a live vaccine. Common live vaccines are varicella, measles, mumps, and rubella.  Do not share food utensils.  Do not use tampons, enemas, or rectal suppositories unless your health care provider has approved.  Keep all appointments as told by your health care provider. This is important. Contact a health care provider if:  You have a fever.  You have chills or you start to shake.  You have: ? A sore throat. ? A warm, red, or tender area on your skin. ? A cough. ? Frequent or painful urination. ? Vaginal discharge or itching.  You develop: ? Sores in your mouth or anus. ? Swollen lymph nodes. ? Red streaks on the skin. ? A rash.  You feel: ? Nauseous or you vomit. ? Very fatigued. ? Short of breath. This information is not intended to replace advice given to you by your health care provider. Make sure you discuss any questions you have with your health care provider. Document Released: 07/11/2001 Document Revised: 06/27/2015 Document Reviewed: 08/01/2014 Elsevier Interactive Patient Education  2018 [employer].     Thrombocytopenia Thrombocytopenia means that you have a low number of platelets in your blood. Platelets are tiny cells in the blood. When you bleed, they clump together at the cut or injury to stop the bleeding. This is called blood clotting. Not having enough platelets can cause bleeding problems. Follow these instructions at home: General instructions  Check your skin and inside your mouth for bruises or blood as told by your doctor.  Check to see if there is blood in your spit (sputum), pee (urine), and poop (stool). Do this as told by your doctor.  Ask your doctor if you can  drink alcohol.  Take over-the-counter and prescription medicines only as told by your doctor.  Tell all of your doctors that you have this condition. Be sure to tell your dentist and eye doctor too. Activity  Do not do activities that can cause bumps or bruises until your doctor says it is okay.  Be careful not to cut yourself: ? When you shave. ? When you use scissors, needles, knives, or other tools.  Be careful not to burn yourself: ? When you use an iron. ? When you cook. Contact a doctor if:  You have bruises and you do not know why. Get help right away if:  You are bleeding anywhere on your body.  You have blood in your spit, pee, or poop. This information is not intended to replace advice given to you by your health care provider. Make sure you discuss any questions you have with your health care provider. Document Released: 01/08/2011 Document Revised: 09/22/2015 Document Reviewed: 07/23/2014 Elsevier Interactive Patient Education  [employer].

## 2017-09-10 NOTE — Progress Notes (Signed)
Per Dr. Lindi Adie, hold pt treatment today related to abnormal labs. Verbal orders received to administer 480 mcg of Granix today to help with pt ANC of 0.9

## 2017-09-13 ENCOUNTER — Telehealth: Payer: Self-pay | Admitting: Medical

## 2017-09-13 NOTE — Progress Notes (Signed)
[facility] Progress Note   Mary Dougherty [id number] 12-06-1970 47 y.o.  Mary Dougherty is managed by Dr. Nicholas Lose  Actively treated with chemotherapy/immunotherapy: yes  Current Therapy: Carboplatin and gemcitabine  Last Treated: 09/03/2017 (cycle 5, day 1)  Assessment: Plan:    Constipation, unspecified constipation type  Malignant neoplasm of lower-outer quadrant of right breast of female, estrogen receptor positive ([place])   Constipation: I discussed with the patient today that I cannot correlate why she is having abdominal pain and constipation when she receives packed red blood cells.  I have suggested to her that she begin senna-S, 1 to 2 tablets twice daily.  ER positive malignant neoplasm of the right breast: The patient continues to be followed by Dr. Lindi Adie and is status post cycle 5, day 1 of carboplatin and gemcitabine which was last dose on 09/03/2017.  The patient will return to see Dr. Lindi Adie in follow-up on 09/24/2017.  Please see After Visit Summary for patient specific instructions.  Future Appointments  Date Time Provider [place]  09/23/2017  9:50 AM [facility]315 MR 1 [id number] [facility] [address] [facility]  09/24/2017 10:15 AM [facility] LAB 4 [facility] None  09/24/2017 10:30 AM [facility] [place] FLUSH [facility] None  09/24/2017 11:00 AM Nicholas Lose, MD [facility] None  09/24/2017 12:00 PM [facility] INFUSION [facility] None  10/01/2017 10:00 AM [facility] LAB ONLY [facility] None  10/01/2017 10:15 AM [facility] [place] FLUSH [facility] None  10/01/2017 10:45 AM Nicholas Lose, MD [facility] None  10/01/2017 12:00 PM [facility] INFUSION [facility] None  10/02/2017  8:30 AM [facility] INFUSION [facility] None  12/17/2017  2:00 PM [facility] INJ NURSE [facility] None  03/18/2018 10:00 AM Nicholas Lose, MD [facility] None  03/18/2018 11:00 AM [facility] INJ NURSE [facility] None    No orders of the defined types were placed in this encounter.      Subjective:   Patient ID:  Mary Dougherty is a 47 y.o. (DOB 1970/12/08) female.  Chief Complaint: No chief complaint on file.   HPI SOSHA Dougherty is a 47 year old female with a history of an ER positive recurrent right breast cancer who is managed by Dr. Lindi Adie and is status post cycle 5, day 1 of carboplatin and gemcitabine which was last dosed on 09/03/2017.  She is receiving a unit of packed red blood cells today.  She reports that she is having constipation and abdominal pain after her transfusions.  She denies fevers, chills, sweats, nausea, or vomiting.  She is not taking anything for constipation.  Medications: I have reviewed the patient's current medications.  Allergies: No Known Allergies  Past Medical History:  Diagnosis Date  . Anemia   . Blood transfusion without reported diagnosis   . Chronic anemia 03/03/13  . Diabetes mellitus without complication ([place])    diabetes II  . Dyspnea    with exertion  . Family history of adverse reaction to anesthesia     father- hard to awaken  . Hereditary spherocytosis ([place])   . Invasive ductal carcinoma of right breast ([place]) 09/2012   ER(99%), PR(100%), Ki-67(70%), 1/5 nodes positive  . S/P radiation therapy 05/08/2013-06/22/2013   1) Right breast / 50.4 Gy in 28 fractions / 2) Right Supraclavicular fossa/ 50.4 Gy in 28 fractions /3) Right Posterior Axillary boost / 11.9 Gy in 28 fractions /4) Right breast boost / 10 Gy in 5 fractions   . Spherocytosis ([place]) 1989  . Status post chemotherapy 10/07/12 - 12/16/12    Neoadjuvant chemotherapy with dose  dense FEC (5-FU/epirubicin/Cytoxan) from 10/07/2012 through 12/16/12.    [place] Kitchen Syncope and collapse 1989   "thats when I found out I have Spherocytosis"  . Thyroid disease   . Use of exemestane (Aromasin) 07/25/13  . Use of goserelin acetate (Zoladex) 07/25/13  . Wears glasses     Past Surgical History:  Procedure Laterality Date  . BREAST LUMPECTOMY WITH SENTINEL LYMPH NODE BIOPSY  Right 04/06/2013   [name]  . BREAST SURGERY     left breast lumpectomy/left ax snbx  . COLONOSCOPY  2015  . PORT-A-CATH REMOVAL N/A 04/06/2013   Procedure: REMOVAL PORT-A-CATH;  Surgeon: [name], MD;  Location: [facility];  Service: General;  Laterality: N/A;  . PORTACATH PLACEMENT Left 09/20/2012   Procedure: INSERTION PORT-A-CATH;  Surgeon: [name], MD;  Location: [facility];  Service: General;  Laterality: Left;  . PORTACATH PLACEMENT Right 05/18/2017   Procedure: INSERTION PORT-A-CATH WITH ULTRASOUND;  Surgeon: [name], MD;  Location: [facility];  Service: General;  Laterality: Right;  . WISDOM TOOTH EXTRACTION      Family History  Problem Relation Age of Onset  . Pulmonary embolism Maternal Uncle 37       died instantly  . Melanoma Cousin        paternal cousin dx in her 81s  . Heart disease Father   . Diabetes Father   . Thyroid disease Mother     Social History   Socioeconomic History  . Marital status: Single    Spouse name: Not on file  . Number of children: Not on file  . Years of education: Not on file  . Highest education level: Not on file  Occupational History  . Not on file  Social Needs  . Financial resource strain: Not on file  . Food insecurity:    Worry: Not on file    Inability: Not on file  . Transportation needs:    Medical: Not on file    Non-medical: Not on file  Tobacco Use  . Smoking status: Never Smoker  . Smokeless tobacco: Never Used  Substance and Sexual Activity  . Alcohol use: No  . Drug use: No  . Sexual activity: Yes    Birth control/protection: Abstinence    Comment: menarche age 24, P43,  last menstrual cycle 08/26/2012  Lifestyle  . Physical activity:    Days per week: Not on file    Minutes per session: Not on file  . Stress: Not on file  Relationships  . Social connections:    Talks on phone: Not on file    Gets together: Not on file    Attends religious  service: Not on file    Active member of club or organization: Not on file    Attends meetings of clubs or organizations: Not on file    Relationship status: Not on file  . Intimate partner violence:    Fear of current or ex partner: Not on file    Emotionally abused: Not on file    Physically abused: Not on file    Forced sexual activity: Not on file  Other Topics Concern  . Not on file  Social History Narrative  . Not on file    Past Medical History, Surgical history, Social history, and Family history were reviewed and updated as appropriate.   Please see review of systems for further details on the patient's review from today.   Review of Systems:  Review of Systems  Constitutional: Negative for appetite change, chills, diaphoresis, fever and unexpected weight change.  Gastrointestinal: Positive for abdominal pain and constipation. Negative for abdominal distention, anal bleeding, blood in stool, diarrhea, nausea, rectal pain and vomiting.    Objective:   Physical Exam:  There were no vitals taken for this visit. ECOG: 0  Physical Exam  Constitutional: No distress.  HENT:  Head: Normocephalic and atraumatic.  Cardiovascular: Normal rate, regular rhythm and normal heart sounds. Exam reveals no gallop and no friction rub.  No murmur heard. Pulmonary/Chest: Effort normal and breath sounds normal. No respiratory distress. She has no wheezes. She has no rales.  Abdominal: Soft. Bowel sounds are normal. She exhibits no distension and no mass. There is no tenderness. There is no rebound and no guarding.  Musculoskeletal: She exhibits no edema.  Neurological: She is alert.  Skin: Skin is warm and dry. She is not diaphoretic.    Lab Review:     Component Value Date/Time   NA 139 09/10/2017 0921   NA 137 05/01/2017 0922   NA 141 03/08/2015 1544   K 4.0 09/10/2017 0921   K 4.2 03/08/2015 1544   CL 105 09/10/2017 0921   CO2 25 09/10/2017 0921   CO2 26 03/08/2015 1544    GLUCOSE 167 (H) 09/10/2017 0921   GLUCOSE 189 (H) 03/08/2015 1544   BUN 9 09/10/2017 0921   BUN 9 05/01/2017 0922   BUN 12.6 03/08/2015 1544   CREATININE 0.65 09/10/2017 0921   CREATININE 0.8 03/08/2015 1544   CALCIUM 8.5 (L) 09/10/2017 0921   CALCIUM 9.3 03/08/2015 1544   PROT 7.0 09/10/2017 0921   PROT 7.1 05/01/2017 0922   PROT 7.8 03/08/2015 1544   ALBUMIN 3.7 09/10/2017 0921   ALBUMIN 4.2 05/01/2017 0922   ALBUMIN 4.0 03/08/2015 1544   AST 54 (H) 09/10/2017 0921   AST 14 03/08/2015 1544   ALT 168 (H) 09/10/2017 0921   ALT 19 03/08/2015 1544   ALKPHOS 116 09/10/2017 0921   ALKPHOS 112 03/08/2015 1544   BILITOT 1.0 09/10/2017 0921   BILITOT 1.45 (H) 03/08/2015 1544   GFRNONAA >60 09/10/2017 0921   GFRNONAA >89 01/25/2015 0859   GFRAA >60 09/10/2017 0921   GFRAA >89 01/25/2015 0859       Component Value Date/Time   WBC 2.1 (L) 09/10/2017 0921   WBC 3.1 (L) 06/12/2017 0800   RBC 2.90 (L) 09/10/2017 0921   HGB 8.4 (L) 09/10/2017 0921   HGB 11.0 (L) 03/08/2015 1544   HCT 24.3 (L) 09/10/2017 0921   HCT 31.6 (L) 03/08/2015 1544   PLT 63 (L) 09/10/2017 0921   PLT 220 03/08/2015 1544   MCV 83.8 09/10/2017 0921   MCV 80.7 05/01/2017 0907   MCV 83.4 03/08/2015 1544   MCH 29.0 09/10/2017 0921   MCHC 34.6 09/10/2017 0921   RDW 15.6 (H) 09/10/2017 0921   RDW 18.7 (H) 03/08/2015 1544   LYMPHSABS 1.0 09/10/2017 0921   LYMPHSABS 2.2 03/08/2015 1544   MONOABS 0.2 09/10/2017 0921   MONOABS 0.6 03/08/2015 1544   EOSABS 0.0 09/10/2017 0921   EOSABS 0.2 03/08/2015 1544   BASOSABS 0.0 09/10/2017 0921   BASOSABS 0.1 03/08/2015 1544   -------------------------------  Imaging from last 24 hours (if applicable):  Radiology interpretation: No results found.

## 2017-09-13 NOTE — Telephone Encounter (Signed)
Patient scheduled per 8/9 sch message  °

## 2017-09-17 ENCOUNTER — Ambulatory Visit: Payer: BLUE CROSS/BLUE SHIELD

## 2017-09-22 ENCOUNTER — Encounter: Payer: Self-pay | Admitting: *Deleted

## 2017-09-23 ENCOUNTER — Ambulatory Visit
Admission: RE | Admit: 2017-09-23 | Discharge: 2017-09-23 | Disposition: A | Discharge status: Home / Self Care | Payer: BLUE CROSS/BLUE SHIELD | Source: Ambulatory Visit | Attending: Hematology and Oncology | Admitting: Hematology and Oncology

## 2017-09-23 DIAGNOSIS — C50511 Malignant neoplasm of lower-outer quadrant of right female breast: Secondary | ICD-10-CM

## 2017-09-23 DIAGNOSIS — Z170 Estrogen receptor positive status [ER+]: Principal | ICD-10-CM

## 2017-09-23 MED ORDER — GADOBENATE DIMEGLUMINE 529 MG/ML IV SOLN
18.0000 mL | Freq: Once | INTRAVENOUS | Status: AC | PRN
  Administered 2017-09-23: 18 mL via INTRAVENOUS

## 2017-09-24 ENCOUNTER — Inpatient Hospital Stay (HOSPITAL_BASED_OUTPATIENT_CLINIC_OR_DEPARTMENT_OTHER): Payer: BLUE CROSS/BLUE SHIELD | Admitting: Hematology and Oncology

## 2017-09-24 ENCOUNTER — Telehealth: Payer: Self-pay | Admitting: Hematology and Oncology

## 2017-09-24 ENCOUNTER — Inpatient Hospital Stay: Payer: BLUE CROSS/BLUE SHIELD

## 2017-09-24 VITALS — BP 121/71 | HR 65 | Temp 98.2°F | Resp 18 | Ht 65.0 in | Wt 189.9 lb

## 2017-09-24 DIAGNOSIS — D709 Neutropenia, unspecified: Secondary | ICD-10-CM | POA: Diagnosis not present

## 2017-09-24 DIAGNOSIS — C50511 Malignant neoplasm of lower-outer quadrant of right female breast: Secondary | ICD-10-CM

## 2017-09-24 DIAGNOSIS — Z95828 Presence of other vascular implants and grafts: Secondary | ICD-10-CM

## 2017-09-24 DIAGNOSIS — Z170 Estrogen receptor positive status [ER+]: Principal | ICD-10-CM

## 2017-09-24 DIAGNOSIS — D696 Thrombocytopenia, unspecified: Secondary | ICD-10-CM

## 2017-09-24 DIAGNOSIS — D649 Anemia, unspecified: Secondary | ICD-10-CM

## 2017-09-24 DIAGNOSIS — Z5111 Encounter for antineoplastic chemotherapy: Secondary | ICD-10-CM | POA: Diagnosis not present

## 2017-09-24 LAB — CMP (CANCER CENTER ONLY)
ALBUMIN: 3.8 g/dL (ref 3.5–5.0)
ALK PHOS: 115 U/L (ref 38–126)
ALT: 44 U/L (ref 0–44)
ANION GAP: 6 (ref 5–15)
AST: 23 U/L (ref 15–41)
BUN: 7 mg/dL (ref 6–20)
CALCIUM: 9 mg/dL (ref 8.9–10.3)
CO2: 29 mmol/L (ref 22–32)
Chloride: 106 mmol/L (ref 98–111)
Creatinine: 0.61 mg/dL (ref 0.44–1.00)
GFR, Est AFR Am: >60 mL/min (ref >60)
GFR, Estimated: >60 mL/min (ref >60)
Glucose, Bld: 134 mg/dL — ABNORMAL HIGH (ref 70–99)
POTASSIUM: 4 mmol/L (ref 3.5–5.1)
SODIUM: 141 mmol/L (ref 135–145)
Total Bilirubin: 1.1 mg/dL (ref 0.3–1.2)
Total Protein: 7.2 g/dL (ref 6.5–8.1)

## 2017-09-24 LAB — CBC WITH DIFFERENTIAL (CANCER CENTER ONLY)
BASOS ABS: 0 10*3/uL (ref 0.0–0.1)
BASOS PCT: 0 %
Eosinophils Absolute: 0.1 10*3/uL (ref 0.0–0.5)
Eosinophils Relative: 2 %
HEMATOCRIT: 31 % — AB (ref 34.8–46.6)
HEMOGLOBIN: 10.6 g/dL — AB (ref 11.6–15.9)
Lymphocytes Relative: 30 %
Lymphs Abs: 2 10*3/uL (ref 0.9–3.3)
MCH: 30.9 pg (ref 25.1–34.0)
MCHC: 34.2 g/dL (ref 31.5–36.0)
MCV: 90.4 fL (ref 79.5–101.0)
MONOS PCT: 7 %
Monocytes Absolute: 0.4 10*3/uL (ref 0.1–0.9)
NEUTROS ABS: 4.2 10*3/uL (ref 1.5–6.5)
Neutrophils Relative %: 61 %
Platelet Count: 109 10*3/uL — ABNORMAL LOW (ref 145–400)
RBC: 3.43 MIL/uL — ABNORMAL LOW (ref 3.70–5.45)
RDW: 21 % — ABNORMAL HIGH (ref 11.2–14.5)
WBC Count: 6.8 10*3/uL (ref 3.9–10.3)

## 2017-09-24 MED ORDER — HEPARIN SOD (PORK) LOCK FLUSH 100 UNIT/ML IV SOLN
500.0000 [IU] | Freq: Once | INTRAVENOUS | Status: AC
  Administered 2017-09-24: 500 [IU]
  Filled 2017-09-24: qty 5

## 2017-09-24 MED ORDER — SODIUM CHLORIDE 0.9% FLUSH
10.0000 mL | Freq: Once | INTRAVENOUS | Status: AC
  Administered 2017-09-24: 10 mL
  Filled 2017-09-24: qty 10

## 2017-09-24 NOTE — Progress Notes (Signed)
Patient Care Team: Forrest Moron, MD as PCP - General (Internal Medicine) Nicholas Lose, MD as Consulting Physician (Hematology and Oncology)  DIAGNOSIS:  Encounter Diagnoses  Name Primary?  . Malignant neoplasm of lower-outer quadrant of right breast of female, estrogen receptor positive ([place]) Yes  . Port-A-Cath in place   . Malignant neoplasm of lower-outer quadrant of right female breast, unspecified estrogen receptor status ([place])     SUMMARY OF ONCOLOGIC HISTORY:   Breast cancer of lower-outer quadrant of right female breast ([place])   09/08/2012 Initial Diagnosis    Breast cancer of lower-outer quadrant of right female breast: ER 99% PR 100% HER-2 negative, Ki-67 70%    10/07/2012 - 03/17/2013 Neo-Adjuvant Chemotherapy    Dose dense FEC followed by weekly Taxol    04/06/2013 Surgery    Right breast lumpectomy residual 1.1 cm IDC grade 3 with high-grade DCIS, PNI +ve, 1/2 SLN positive, no MVI, T1 C. N1 A. stage IIA, ER 97% PR 95% HER-2 1.1 ratio negative    05/08/2013 - 06/22/2013 Radiation Therapy    Radiation therapy to lumpectomy site    07/03/2013 - 04/27/2017 Anti-estrogen oral therapy    Zoladex plus Aromasin     Procedure    Genetic testing:PTEN C.-([id number])del 26 on [employer] testing    04/15/2017 Relapse/Recurrence    Right breast biopsy: Invasive ductal carcinoma, grade 3, ER 10%, PR 0%, Ki-67 30%, HER-2 equivocal, ratio 1.36, copy #5.2, IHC 1+ negative    04/22/2017 Breast MRI    Malignancy LOQ right breast 2.3 cm within the lumpectomy scar, left breast normal    06/02/2017 - 09/03/2017 Neo-Adjuvant Chemotherapy    Carboplatin and gemcitabine neoadjuvant chemotherapy    06/11/2017 - 06/12/2017 Hospital Admission    Syncope due to anemia required blood transfusion    09/23/2017 Breast MRI    Persistent enhancement right breast malignancy at 6:00 2.4 x 1.7 x 0.8 cm enhancement in the right nipple could be physiologic      Malignant neoplasm of lower-outer quadrant of  right breast of female, estrogen receptor positive ([place])    CHIEF COMPLIANT: Follow-up to review breast MRI  INTERVAL HISTORY: Mary Dougherty is a 47 year old with above-mentioned history of recurrent right breast cancer who is currently on neoadjuvant chemotherapy with carboplatin and gemcitabine.  She received 5 cycles of chemotherapy and had a breast MRI.  She is here to discuss results of MRI.  She has 1 more cycle of chemo but she wanted to get the MRI done because of other issues.  The MRI showed very minimal to no response to chemotherapy.  Because of this I have decided to discontinue further chemo and refer her to see Dr. Donne Hazel for surgery.  She also has profound toxicities to chemotherapy which include a neutropenia severe anemia requiring multiple blood transfusions.  Based on risk-benefit analysis we decided to discontinue additional chemo.  REVIEW OF SYSTEMS:   Constitutional: Denies fevers, chills or abnormal weight loss Eyes: Denies blurriness of vision Ears, nose, mouth, throat, and face: Denies mucositis or sore throat Respiratory: Denies cough, dyspnea or wheezes Cardiovascular: Denies palpitation, chest discomfort Gastrointestinal:  Denies nausea, heartburn or change in bowel habits Skin: Denies abnormal skin rashes Lymphatics: Denies new lymphadenopathy or easy bruising Neurological:Denies numbness, tingling or new weaknesses Behavioral/Psych: Mood is stable, no new changes  Extremities: No lower extremity edema  All other systems were reviewed with the patient and are negative.  I have reviewed the past medical history, past surgical history,  social history and family history with the patient and they are unchanged from previous note.  ALLERGIES:  has No Known Allergies.  MEDICATIONS:  Current Outpatient Medications  Medication Sig Dispense Refill  . cetirizine HCl (ZYRTEC) 5 MG/5ML SOLN Take 5 mLs (5 mg total) by mouth daily. 111 mL   . folic acid (FOLVITE)  1 MG tablet Take 1 mg by mouth daily.    . metFORMIN (GLUCOPHAGE) 500 MG tablet TAKE 1 TABLET BY MOUTH EVERY DAY WITH BREAKFAST 30 tablet 0  . ondansetron (ZOFRAN) 8 MG tablet Take 1 tablet (8 mg total) by mouth every 8 (eight) hours as needed for nausea. (Patient not taking: Reported on 06/26/2017) 30 tablet 3  . polyethylene glycol (MIRALAX / GLYCOLAX) packet Take 17 g by mouth daily as needed for mild constipation or moderate constipation.    . prochlorperazine (COMPAZINE) 10 MG tablet Take 1 tablet (10 mg total) by mouth every 6 (six) hours as needed for nausea or vomiting. (Patient not taking: Reported on 06/26/2017) 30 tablet 0   No current facility-administered medications for this visit.     PHYSICAL EXAMINATION: ECOG PERFORMANCE STATUS: 1 - Symptomatic but completely ambulatory  Vitals:   09/24/17 1134  BP: 121/71  Pulse: 65  Resp: 18  Temp: 98.2 F (36.8 C)  SpO2: 98%   Filed Weights   09/24/17 1134  Weight: 189 lb 14.4 oz (86.1 kg)    GENERAL:alert, no distress and comfortable SKIN: skin color, texture, turgor are normal, no rashes or significant lesions EYES: normal, Conjunctiva are pink and non-injected, sclera clear OROPHARYNX:no exudate, no erythema and lips, buccal mucosa, and tongue normal  NECK: supple, thyroid normal size, non-tender, without nodularity LYMPH:  no palpable lymphadenopathy in the cervical, axillary or inguinal LUNGS: clear to auscultation and percussion with normal breathing effort HEART: regular rate & rhythm and no murmurs and no lower extremity edema ABDOMEN:abdomen soft, non-tender and normal bowel sounds MUSCULOSKELETAL:no cyanosis of digits and no clubbing  NEURO: alert & oriented x 3 with fluent speech, no focal motor/sensory deficits EXTREMITIES: No lower extremity edema   LABORATORY DATA:  I have reviewed the data as listed CMP Latest Ref Rng & Units 09/24/2017 09/10/2017 09/03/2017  Glucose 70 - 99 mg/dL 134(H) 167(H) 214(H)  BUN 6 - 20  mg/dL _0 Creatinine 0.44 - 1.00 mg/dL 0.61 0.65 0.76  Sodium 135 - 145 mmol/L 141 139 140  Potassium 3.5 - 5.1 mmol/L 4.0 4.0 3.9  Chloride 98 - 111 mmol/L 106 105 104  CO2 22 - 32 mmol/L _1 Calcium 8.9 - 10.3 mg/dL 9.0 8.5(L) 8.6(L)  Total Protein 6.5 - 8.1 g/dL 7.2 7.0 6.9  Total Bilirubin 0.3 - 1.2 mg/dL 1.1 1.0 0.8  Alkaline Phos 38 - 126 U/L 115 116 119  AST 15 - 41 U/L 23 54(H) 21  ALT 0 - 44 U/L 44 168(H) 42    Lab Results  Component Value Date   WBC 6.8 09/24/2017   HGB 10.6 (L) 09/24/2017   HCT 31.0 (L) 09/24/2017   MCV 90.4 09/24/2017   PLT 109 (L) 09/24/2017   NEUTROABS 4.2 09/24/2017    ASSESSMENT & PLAN:  Breast cancer of lower-outer quadrant of right female breast Right breast invasive ductal carcinoma ER/PR positive HER-2 negative stage II A. T3, N1, M0 with PTEN mutation Priortreatment: Adjuvant Zoladex and exemestane started 07/03/2013, plan is for 5 years; changed to every 3 month Zoladex injection from May 2016 ------------------------------------------------------------------------------  Breast MRI 04/22/2017: 2.3 cm malignancy lower outer quadrant right breast within the lumpectomy scar  Recommendation: 1.Neoadjuvant chemotherapy with carboplatin and gemcitabine day 1 and 8 every 3 weeks for 6 cycles 2.followed by mastectomy 3.Followed by continuedantiestrogen therapy  PET/CT scan: Negative for distant metastasis -------------------------------------------------------------------------------- Current treatment: Completed 5 cycles of carboplatin and gemcitabine I did not recommend any additional chemotherapy because of breast MRI does not show any significant response to systemic chemotherapy in addition she has profound neutropenia and thrombocytopenia along with severe anemia requiring blood transfusions with each of her prior chemotherapies.  Based on risk-benefit analysis we decided to not give her any further chemo.  I sent a message  to Dr. Donne Hazel to evaluate her for surgery.  Unfortunately she will need a mastectomy given the prior breast cancer and she is very concerned about her reconstruction options. Apparently she is being laid off from work as well at the same time and wants to get the reconstruction done before her insurance runs out.  Breast MRI 09/21/2017:Persistent enhancement right breast malignancy at 6:00 2.4 x 1.7 x 0.8 cm enhancement in the right nipple could be physiologic  Radiology review: I discussed with the patient that the MRI only showed a slight improvement in the disease. Given the morbidity related to the chemotherapy and very little benefit, I do not recommend any further chemotherapy.  I recommended that she move on to surgery.  Severe anemia: Required blood transfusions.  Today's hemoglobin is 10.6. Her port can be removed during surgery. Return to clinic after surgery to discuss pathology report.    No orders of the defined types were placed in this encounter.  The patient has a good understanding of the overall plan. she agrees with it. she will call with any problems that may develop before the next visit here.   Harriette Ohara, MD 09/24/17

## 2017-09-24 NOTE — Assessment & Plan Note (Signed)
Right breast invasive ductal carcinoma ER/PR positive HER-2 negative stage II A. T3, N1, M0 with PTEN mutation Priortreatment: Adjuvant Zoladex and exemestane started 07/03/2013, plan is for 5 years; changed to every 3 month Zoladex injection from May 2016 ------------------------------------------------------------------------------ Breast MRI 04/22/2017: 2.3 cm malignancy lower outer quadrant right breast within the lumpectomy scar  Recommendation: 1.Neoadjuvant chemotherapy with carboplatin and gemcitabine day 1 and 8 every 3 weeks for 6 cycles 2.followed by mastectomy 3.Followed by continuedantiestrogen therapy  PET/CT scan: Negative for distant metastasis -------------------------------------------------------------------------------- Current treatment: Completed 5 cycles of carboplatin and gemcitabine  Breast MRI 09/21/2017:Persistent enhancement right breast malignancy at 6:00 2.4 x 1.7 x 0.8 cm enhancement in the right nipple could be physiologic  Radiology review: I discussed with the patient that the MRI only showed a slight improvement in the disease. Given the morbidity related to the chemotherapy and very little benefit, I do not recommend any further chemotherapy.  I recommended that she move on to surgery.  Severe anemia: Required blood transfusions.  Return to clinic after surgery to discuss pathology report.

## 2017-09-24 NOTE — Telephone Encounter (Signed)
Per 8/23 los, no new orders or referrals.

## 2017-09-24 NOTE — Patient Instructions (Signed)

## 2017-09-27 ENCOUNTER — Other Ambulatory Visit: Payer: Self-pay | Admitting: General Surgery

## 2017-09-27 DIAGNOSIS — C50511 Malignant neoplasm of lower-outer quadrant of right female breast: Secondary | ICD-10-CM

## 2017-09-27 DIAGNOSIS — Z170 Estrogen receptor positive status [ER+]: Principal | ICD-10-CM

## 2017-09-28 ENCOUNTER — Encounter (HOSPITAL_BASED_OUTPATIENT_CLINIC_OR_DEPARTMENT_OTHER): Payer: Self-pay | Admitting: *Deleted

## 2017-09-28 ENCOUNTER — Other Ambulatory Visit: Payer: Self-pay

## 2017-10-01 ENCOUNTER — Telehealth: Payer: Self-pay | Admitting: Hematology and Oncology

## 2017-10-01 ENCOUNTER — Inpatient Hospital Stay: Payer: BLUE CROSS/BLUE SHIELD

## 2017-10-01 ENCOUNTER — Inpatient Hospital Stay: Payer: BLUE CROSS/BLUE SHIELD | Admitting: Hematology and Oncology

## 2017-10-01 NOTE — Telephone Encounter (Signed)
Patient left message to cx 8/30 and 8/31 appointments due to having surgery Tuesday per patient appointments not needed. Patient message for to desk nurse and asked that scheduling message be sent should patient need an additional appointments.

## 2017-10-01 NOTE — Assessment & Plan Note (Deleted)
Right breast invasive ductal carcinoma ER/PR positive HER-2 negative stage II A. T3, N1, M0 with PTEN mutation Priortreatment: Adjuvant Zoladex and exemestane started 07/03/2013, plan is for 5 years; changed to every 3 month Zoladex injection from May 2016 ------------------------------------------------------------------------------ Breast MRI 04/22/2017: 2.3 cm malignancy lower outer quadrant right breast within the lumpectomy scar Breast MRI 09/21/2017:Persistent enhancement right breast malignancy at 6:00 2.4 x 1.7 x 0.8 cm enhancement in the right nipple could be physiologic  Recommendation: 1.Neoadjuvant chemotherapy with carboplatin and gemcitabine day 1 and 8 every 3 weeks for 5 cycles (stopped early because of lack of response to chemo) 2.followed by mastectomy 3.Followed by continuedantiestrogen therapy  PET/CT scan: Negative for distant metastasis  After going back and forth between different options, we determined that she will have delayed reconstruction. she is being laid off from work as well at the same time and wants to get the reconstruction done before her insurance runs out.  Severe anemia: Previous required blood transfusions Return to clinic after surgery

## 2017-10-05 ENCOUNTER — Other Ambulatory Visit: Payer: Self-pay

## 2017-10-05 ENCOUNTER — Ambulatory Visit (HOSPITAL_BASED_OUTPATIENT_CLINIC_OR_DEPARTMENT_OTHER)
Admission: RE | Admit: 2017-10-05 | Discharge: 2017-10-06 | Disposition: A | Discharge status: Home / Self Care | Payer: BLUE CROSS/BLUE SHIELD | Source: Ambulatory Visit | Attending: General Surgery | Admitting: General Surgery

## 2017-10-05 ENCOUNTER — Encounter (HOSPITAL_BASED_OUTPATIENT_CLINIC_OR_DEPARTMENT_OTHER): Payer: Self-pay | Admitting: *Deleted

## 2017-10-05 ENCOUNTER — Encounter (HOSPITAL_BASED_OUTPATIENT_CLINIC_OR_DEPARTMENT_OTHER)
Admission: RE | Disposition: A | Discharge status: Home / Self Care | Payer: Self-pay | Source: Ambulatory Visit | Attending: General Surgery

## 2017-10-05 ENCOUNTER — Ambulatory Visit (HOSPITAL_BASED_OUTPATIENT_CLINIC_OR_DEPARTMENT_OTHER): Payer: BLUE CROSS/BLUE SHIELD | Admitting: Anesthesiology

## 2017-10-05 ENCOUNTER — Ambulatory Visit (HOSPITAL_COMMUNITY)
Admission: RE | Admit: 2017-10-05 | Discharge: 2017-10-05 | Disposition: A | Discharge status: Home / Self Care | Payer: BLUE CROSS/BLUE SHIELD | Source: Ambulatory Visit | Attending: General Surgery | Admitting: General Surgery

## 2017-10-05 DIAGNOSIS — Z923 Personal history of irradiation: Secondary | ICD-10-CM | POA: Diagnosis not present

## 2017-10-05 DIAGNOSIS — Z853 Personal history of malignant neoplasm of breast: Secondary | ICD-10-CM | POA: Diagnosis not present

## 2017-10-05 DIAGNOSIS — C50911 Malignant neoplasm of unspecified site of right female breast: Secondary | ICD-10-CM | POA: Diagnosis present

## 2017-10-05 DIAGNOSIS — Z9221 Personal history of antineoplastic chemotherapy: Secondary | ICD-10-CM | POA: Diagnosis not present

## 2017-10-05 DIAGNOSIS — R7303 Prediabetes: Secondary | ICD-10-CM | POA: Diagnosis not present

## 2017-10-05 DIAGNOSIS — C50511 Malignant neoplasm of lower-outer quadrant of right female breast: Secondary | ICD-10-CM

## 2017-10-05 DIAGNOSIS — C50411 Malignant neoplasm of upper-outer quadrant of right female breast: Secondary | ICD-10-CM | POA: Diagnosis not present

## 2017-10-05 DIAGNOSIS — Z170 Estrogen receptor positive status [ER+]: Principal | ICD-10-CM

## 2017-10-05 HISTORY — DX: Prediabetes: R73.03

## 2017-10-05 HISTORY — DX: Malignant neoplasm of unspecified site of right female breast: C50.911

## 2017-10-05 HISTORY — PX: MASTECTOMY W/ SENTINEL NODE BIOPSY: SHX2001

## 2017-10-05 LAB — TYPE AND SCREEN
ABO/RH(D): B POS
ANTIBODY SCREEN: NEGATIVE

## 2017-10-05 LAB — CBC
HEMATOCRIT: 30.2 % — AB (ref 36.0–46.0)
HEMOGLOBIN: 10.3 g/dL — AB (ref 12.0–15.0)
MCH: 31 pg (ref 26.0–34.0)
MCHC: 34.1 g/dL (ref 30.0–36.0)
MCV: 91 fL (ref 78.0–100.0)
Platelets: 90 10*3/uL — ABNORMAL LOW (ref 150–400)
RBC: 3.32 MIL/uL — AB (ref 3.87–5.11)
RDW: 18.6 % — ABNORMAL HIGH (ref 11.5–15.5)
WBC: 8.8 10*3/uL (ref 4.0–10.5)

## 2017-10-05 LAB — ABO/RH: ABO/RH(D): B POS

## 2017-10-05 SURGERY — MASTECTOMY WITH SENTINEL LYMPH NODE BIOPSY
Anesthesia: General | Site: Breast | Laterality: Right

## 2017-10-05 MED ORDER — ACETAMINOPHEN 500 MG PO TABS
ORAL_TABLET | ORAL | Status: AC
  Filled 2017-10-05: qty 2

## 2017-10-05 MED ORDER — GLYCOPYRROLATE PF 0.2 MG/ML IJ SOSY
PREFILLED_SYRINGE | INTRAMUSCULAR | Status: AC
  Filled 2017-10-05: qty 1

## 2017-10-05 MED ORDER — CEFAZOLIN SODIUM-DEXTROSE 2-4 GM/100ML-% IV SOLN
INTRAVENOUS | Status: AC
  Filled 2017-10-05: qty 100

## 2017-10-05 MED ORDER — DEXAMETHASONE SODIUM PHOSPHATE 4 MG/ML IJ SOLN
INTRAMUSCULAR | Status: DC | PRN
  Administered 2017-10-05: 10 mg via INTRAVENOUS

## 2017-10-05 MED ORDER — SODIUM CHLORIDE 0.9 % IV SOLN
INTRAVENOUS | Status: DC
  Administered 2017-10-05: 11:00:00 via INTRAVENOUS

## 2017-10-05 MED ORDER — SIMETHICONE 80 MG PO CHEW: 40.0000 mg | CHEWABLE_TABLET | Freq: Four times a day (QID) | ORAL | Status: DC | PRN

## 2017-10-05 MED ORDER — HYDROMORPHONE HCL 1 MG/ML IJ SOLN
INTRAMUSCULAR | Status: AC
  Filled 2017-10-05: qty 0.5

## 2017-10-05 MED ORDER — FENTANYL CITRATE (PF) 100 MCG/2ML IJ SOLN
INTRAMUSCULAR | Status: AC
  Filled 2017-10-05: qty 2

## 2017-10-05 MED ORDER — MEPERIDINE HCL 25 MG/ML IJ SOLN: 6.2500 mg | INTRAMUSCULAR | Status: DC | PRN

## 2017-10-05 MED ORDER — KETOROLAC TROMETHAMINE 15 MG/ML IJ SOLN
INTRAMUSCULAR | Status: AC
  Filled 2017-10-05: qty 1

## 2017-10-05 MED ORDER — NAPROXEN 500 MG PO TABS: 500.0000 mg | ORAL_TABLET | Freq: Two times a day (BID) | ORAL | Status: DC | PRN

## 2017-10-05 MED ORDER — KETOROLAC TROMETHAMINE 15 MG/ML IJ SOLN
15.0000 mg | INTRAMUSCULAR | Status: AC
  Administered 2017-10-05: 15 mg via INTRAVENOUS

## 2017-10-05 MED ORDER — DEXAMETHASONE SODIUM PHOSPHATE 10 MG/ML IJ SOLN
INTRAMUSCULAR | Status: AC
  Filled 2017-10-05: qty 1

## 2017-10-05 MED ORDER — GLYCOPYRROLATE 0.2 MG/ML IJ SOLN
INTRAMUSCULAR | Status: DC | PRN
  Administered 2017-10-05: 0.2 mg via INTRAVENOUS

## 2017-10-05 MED ORDER — GABAPENTIN 100 MG PO CAPS
100.0000 mg | ORAL_CAPSULE | ORAL | Status: AC
  Administered 2017-10-05: 100 mg via ORAL

## 2017-10-05 MED ORDER — SCOPOLAMINE 1 MG/3DAYS TD PT72: 1.0000 | MEDICATED_PATCH | Freq: Once | TRANSDERMAL | Status: DC | PRN

## 2017-10-05 MED ORDER — PROPOFOL 10 MG/ML IV BOLUS
INTRAVENOUS | Status: DC | PRN
  Administered 2017-10-05: 200 mg via INTRAVENOUS

## 2017-10-05 MED ORDER — GABAPENTIN 100 MG PO CAPS
ORAL_CAPSULE | ORAL | Status: AC
  Filled 2017-10-05: qty 1

## 2017-10-05 MED ORDER — LACTATED RINGERS IV SOLN
INTRAVENOUS | Status: DC
  Administered 2017-10-05 (×2): via INTRAVENOUS

## 2017-10-05 MED ORDER — ONDANSETRON 4 MG PO TBDP: 4.0000 mg | ORAL_TABLET | Freq: Four times a day (QID) | ORAL | Status: DC | PRN

## 2017-10-05 MED ORDER — ACETAMINOPHEN 500 MG PO TABS
1000.0000 mg | ORAL_TABLET | ORAL | Status: AC
  Administered 2017-10-05: 1000 mg via ORAL

## 2017-10-05 MED ORDER — MIDAZOLAM HCL 2 MG/2ML IJ SOLN
1.0000 mg | INTRAMUSCULAR | Status: DC | PRN
  Administered 2017-10-05: 2 mg via INTRAVENOUS

## 2017-10-05 MED ORDER — OXYCODONE HCL 5 MG PO TABS: 5.0000 mg | ORAL_TABLET | Freq: Once | ORAL | Status: DC | PRN

## 2017-10-05 MED ORDER — MORPHINE SULFATE (PF) 4 MG/ML IV SOLN: 1.0000 mg | INTRAVENOUS | Status: DC | PRN

## 2017-10-05 MED ORDER — METHYLENE BLUE 0.5 % INJ SOLN
INTRAVENOUS | Status: DC | PRN
  Administered 2017-10-05: 4 mL via INTRADERMAL

## 2017-10-05 MED ORDER — CEFAZOLIN SODIUM-DEXTROSE 2-4 GM/100ML-% IV SOLN
2.0000 g | INTRAVENOUS | Status: AC
  Administered 2017-10-05: 2 g via INTRAVENOUS

## 2017-10-05 MED ORDER — EPHEDRINE SULFATE 50 MG/ML IJ SOLN
INTRAMUSCULAR | Status: DC | PRN
  Administered 2017-10-05: 10 mg via INTRAVENOUS

## 2017-10-05 MED ORDER — ONDANSETRON HCL 4 MG/2ML IJ SOLN
INTRAMUSCULAR | Status: AC
  Filled 2017-10-05: qty 2

## 2017-10-05 MED ORDER — FENTANYL CITRATE (PF) 100 MCG/2ML IJ SOLN
50.0000 ug | INTRAMUSCULAR | Status: AC | PRN
  Administered 2017-10-05: 25 ug via INTRAVENOUS
  Administered 2017-10-05: 100 ug via INTRAVENOUS
  Administered 2017-10-05: 25 ug via INTRAVENOUS

## 2017-10-05 MED ORDER — SODIUM CHLORIDE 0.9 % IJ SOLN
INTRAMUSCULAR | Status: DC | PRN
  Administered 2017-10-05: 1 mL

## 2017-10-05 MED ORDER — METHOCARBAMOL 500 MG PO TABS
500.0000 mg | ORAL_TABLET | Freq: Four times a day (QID) | ORAL | Status: DC | PRN
  Administered 2017-10-05: 500 mg via ORAL
  Filled 2017-10-05: qty 1

## 2017-10-05 MED ORDER — EPHEDRINE 5 MG/ML INJ
INTRAVENOUS | Status: AC
  Filled 2017-10-05: qty 10

## 2017-10-05 MED ORDER — ACETAMINOPHEN 500 MG PO TABS
1000.0000 mg | ORAL_TABLET | Freq: Four times a day (QID) | ORAL | Status: DC
  Administered 2017-10-05 – 2017-10-06 (×3): 1000 mg via ORAL
  Filled 2017-10-05 (×3): qty 2

## 2017-10-05 MED ORDER — ROPIVACAINE HCL 5 MG/ML IJ SOLN
INTRAMUSCULAR | Status: DC | PRN
  Administered 2017-10-05: 30 mL via PERINEURAL

## 2017-10-05 MED ORDER — OXYCODONE HCL 5 MG/5ML PO SOLN: 5.0000 mg | Freq: Once | ORAL | Status: DC | PRN

## 2017-10-05 MED ORDER — ENSURE PRE-SURGERY PO LIQD: 296.0000 mL | Freq: Once | ORAL | Status: DC

## 2017-10-05 MED ORDER — PROPOFOL 500 MG/50ML IV EMUL
INTRAVENOUS | Status: AC
  Filled 2017-10-05: qty 50

## 2017-10-05 MED ORDER — BUPIVACAINE HCL (PF) 0.25 % IJ SOLN
INTRAMUSCULAR | Status: AC
  Filled 2017-10-05: qty 30

## 2017-10-05 MED ORDER — METHYLENE BLUE 0.5 % INJ SOLN
INTRAVENOUS | Status: AC
  Filled 2017-10-05: qty 10

## 2017-10-05 MED ORDER — LIDOCAINE 2% (20 MG/ML) 5 ML SYRINGE
INTRAMUSCULAR | Status: AC
  Filled 2017-10-05: qty 5

## 2017-10-05 MED ORDER — OXYCODONE HCL 5 MG PO TABS
5.0000 mg | ORAL_TABLET | ORAL | Status: DC | PRN
  Administered 2017-10-05: 5 mg via ORAL
  Filled 2017-10-05: qty 1

## 2017-10-05 MED ORDER — LIDOCAINE HCL (CARDIAC) PF 100 MG/5ML IV SOSY
PREFILLED_SYRINGE | INTRAVENOUS | Status: DC | PRN
  Administered 2017-10-05: 80 mg via INTRAVENOUS

## 2017-10-05 MED ORDER — HYDROMORPHONE HCL 1 MG/ML IJ SOLN
0.2500 mg | INTRAMUSCULAR | Status: DC | PRN
  Administered 2017-10-05: 0.5 mg via INTRAVENOUS

## 2017-10-05 MED ORDER — MIDAZOLAM HCL 2 MG/2ML IJ SOLN
INTRAMUSCULAR | Status: AC
  Filled 2017-10-05: qty 2

## 2017-10-05 MED ORDER — PROMETHAZINE HCL 25 MG/ML IJ SOLN: 6.2500 mg | INTRAMUSCULAR | Status: DC | PRN

## 2017-10-05 MED ORDER — ONDANSETRON HCL 4 MG/2ML IJ SOLN: 4.0000 mg | Freq: Four times a day (QID) | INTRAMUSCULAR | Status: DC | PRN

## 2017-10-05 MED ORDER — SODIUM CHLORIDE 0.9 % IJ SOLN
INTRAMUSCULAR | Status: AC
  Filled 2017-10-05: qty 10

## 2017-10-05 MED ORDER — TECHNETIUM TC 99M SULFUR COLLOID FILTERED
1.0000 | Freq: Once | INTRAVENOUS | Status: AC | PRN
  Administered 2017-10-05: 1 via INTRADERMAL

## 2017-10-05 SURGICAL SUPPLY — 79 items
ADH SKN CLS APL DERMABOND .7 (GAUZE/BANDAGES/DRESSINGS) ×1
APL SKNCLS STERI-STRIP NONHPOA (GAUZE/BANDAGES/DRESSINGS)
APPLIER CLIP 11 MED OPEN (CLIP) ×2
APPLIER CLIP 9.375 MED OPEN (MISCELLANEOUS)
APR CLP MED 11 20 MLT OPN (CLIP) ×1
APR CLP MED 9.3 20 MLT OPN (MISCELLANEOUS)
BENZOIN TINCTURE PRP APPL 2/3 (GAUZE/BANDAGES/DRESSINGS) IMPLANT
BINDER BREAST LRG (GAUZE/BANDAGES/DRESSINGS) IMPLANT
BINDER BREAST MEDIUM (GAUZE/BANDAGES/DRESSINGS) IMPLANT
BINDER BREAST XLRG (GAUZE/BANDAGES/DRESSINGS) IMPLANT
BINDER BREAST XXLRG (GAUZE/BANDAGES/DRESSINGS) IMPLANT
BIOPATCH RED 1 DISK 7.0 (GAUZE/BANDAGES/DRESSINGS) ×1 IMPLANT
BLADE CLIPPER SURG (BLADE) IMPLANT
BLADE HEX COATED 2.75 (ELECTRODE) IMPLANT
BLADE SURG 10 STRL SS (BLADE) ×2 IMPLANT
BLADE SURG 15 STRL LF DISP TIS (BLADE) ×1 IMPLANT
BLADE SURG 15 STRL SS (BLADE) ×2
CANISTER SUCT 1200ML W/VALVE (MISCELLANEOUS) ×2 IMPLANT
CHLORAPREP W/TINT 26ML (MISCELLANEOUS) ×2 IMPLANT
CLIP APPLIE 11 MED OPEN (CLIP) IMPLANT
CLIP APPLIE 9.375 MED OPEN (MISCELLANEOUS) IMPLANT
CLIP VESOCCLUDE SM WIDE 6/CT (CLIP) IMPLANT
COVER BACK TABLE 60X90IN (DRAPES) ×2 IMPLANT
COVER MAYO STAND STRL (DRAPES) ×2 IMPLANT
COVER PROBE W GEL 5X96 (DRAPES) ×2 IMPLANT
DECANTER SPIKE VIAL GLASS SM (MISCELLANEOUS) IMPLANT
DERMABOND ADVANCED (GAUZE/BANDAGES/DRESSINGS) ×1
DERMABOND ADVANCED .7 DNX12 (GAUZE/BANDAGES/DRESSINGS) ×1 IMPLANT
DRAIN CHANNEL 19F RND (DRAIN) ×2 IMPLANT
DRAPE TOP ARMCOVERS (MISCELLANEOUS) ×2 IMPLANT
DRAPE U-SHAPE 76X120 STRL (DRAPES) ×2 IMPLANT
DRAPE UTILITY XL STRL (DRAPES) ×2 IMPLANT
DRSG PAD ABDOMINAL 8X10 ST (GAUZE/BANDAGES/DRESSINGS) ×2 IMPLANT
DRSG TEGADERM 4X4.75 (GAUZE/BANDAGES/DRESSINGS) IMPLANT
ELECT BLADE 4.0 EZ CLEAN MEGAD (MISCELLANEOUS)
ELECT REM PT RETURN 9FT ADLT (ELECTROSURGICAL) ×2
ELECTRODE BLDE 4.0 EZ CLN MEGD (MISCELLANEOUS) IMPLANT
ELECTRODE REM PT RTRN 9FT ADLT (ELECTROSURGICAL) ×1 IMPLANT
EVACUATOR SILICONE 100CC (DRAIN) ×2 IMPLANT
GAUZE SPONGE 4X4 12PLY STRL LF (GAUZE/BANDAGES/DRESSINGS) IMPLANT
GLOVE BIO SURGEON STRL SZ7 (GLOVE) ×4 IMPLANT
GLOVE BIOGEL PI IND STRL 6.5 (GLOVE) IMPLANT
GLOVE BIOGEL PI IND STRL 7.0 (GLOVE) IMPLANT
GLOVE BIOGEL PI IND STRL 7.5 (GLOVE) ×1 IMPLANT
GLOVE BIOGEL PI INDICATOR 6.5 (GLOVE) ×1
GLOVE BIOGEL PI INDICATOR 7.0 (GLOVE) ×1
GLOVE BIOGEL PI INDICATOR 7.5 (GLOVE) ×1
GLOVE SURG SS PI 6.5 STRL IVOR (GLOVE) ×1 IMPLANT
GOWN STRL REUS W/ TWL LRG LVL3 (GOWN DISPOSABLE) ×2 IMPLANT
GOWN STRL REUS W/TWL LRG LVL3 (GOWN DISPOSABLE) ×4
ILLUMINATOR WAVEGUIDE N/F (MISCELLANEOUS) IMPLANT
LIGHT WAVEGUIDE WIDE FLAT (MISCELLANEOUS) ×1 IMPLANT
NDL HYPO 25X1 1.5 SAFETY (NEEDLE) IMPLANT
NDL SAFETY ECLIPSE 18X1.5 (NEEDLE) IMPLANT
NEEDLE HYPO 18GX1.5 SHARP (NEEDLE)
NEEDLE HYPO 25X1 1.5 SAFETY (NEEDLE) ×2 IMPLANT
NS IRRIG 1000ML POUR BTL (IV SOLUTION) ×2 IMPLANT
PACK BASIN DAY SURGERY FS (CUSTOM PROCEDURE TRAY) ×2 IMPLANT
PENCIL BUTTON HOLSTER BLD 10FT (ELECTRODE) ×2 IMPLANT
PIN SAFETY STERILE (MISCELLANEOUS) ×2 IMPLANT
SHEET MEDIUM DRAPE 40X70 STRL (DRAPES) ×1 IMPLANT
SLEEVE SCD COMPRESS KNEE MED (MISCELLANEOUS) ×2 IMPLANT
SPONGE LAP 18X18 RF (DISPOSABLE) ×4 IMPLANT
SPONGE LAP 4X18 RFD (DISPOSABLE) IMPLANT
STOCKINETTE IMPERVIOUS LG (DRAPES) IMPLANT
STRIP CLOSURE SKIN 1/2X4 (GAUZE/BANDAGES/DRESSINGS) ×2 IMPLANT
SUT ETHILON 2 0 FS 18 (SUTURE) ×2 IMPLANT
SUT MNCRL AB 4-0 PS2 18 (SUTURE) ×2 IMPLANT
SUT SILK 2 0 SH (SUTURE) ×1 IMPLANT
SUT VIC AB 3-0 54X BRD REEL (SUTURE) IMPLANT
SUT VIC AB 3-0 BRD 54 (SUTURE)
SUT VIC AB 3-0 SH 27 (SUTURE)
SUT VIC AB 3-0 SH 27X BRD (SUTURE) IMPLANT
SUT VICRYL 3-0 CR8 SH (SUTURE) ×2 IMPLANT
SYR CONTROL 10ML LL (SYRINGE) ×2 IMPLANT
TOWEL GREEN STERILE FF (TOWEL DISPOSABLE) ×3 IMPLANT
TOWEL OR NON WOVEN STRL DISP B (DISPOSABLE) ×2 IMPLANT
TUBE CONNECTING 20X1/4 (TUBING) ×2 IMPLANT
YANKAUER SUCT BULB TIP NO VENT (SUCTIONS) ×2 IMPLANT

## 2017-10-05 NOTE — Progress Notes (Signed)
Assisted Dr. Miller with right, ultrasound guided, pectoralis block. Side rails up, monitors on throughout procedure. See vital signs in flow sheet. Tolerated Procedure well. 

## 2017-10-05 NOTE — Anesthesia Procedure Notes (Signed)
Anesthesia Regional Block: Pectoralis block   Pre-Anesthetic Checklist: ,, timeout performed, Correct Patient, Correct Site, Correct Laterality, Correct Procedure, Correct Position, site marked, Risks and benefits discussed,  Surgical consent,  Pre-op evaluation,  At surgeon's request and post-op pain management  Laterality: Right  Prep: chloraprep       Needles:  Injection technique: Single-shot  Needle Type: Stimiplex     Needle Length: 9cm  Needle Gauge: 21     Additional Needles:   Procedures:,,,, ultrasound used (permanent image in chart),,,,  Narrative:  Start time: 10/05/2017 7:20 AM End time: 10/05/2017 7:25 AM Injection made incrementally with aspirations every 5 mL.  Performed by: Personally  Anesthesiologist: Lynda Rainwater, MD

## 2017-10-05 NOTE — Anesthesia Postprocedure Evaluation (Signed)
Anesthesia Post Note  Patient: Mary Dougherty  Procedure(s) Performed: RIGHT MASTECTOMY WITH RIGHT SENTINEL LYMPH NODE BIOPSY (Right Breast)     Patient location during evaluation: PACU Anesthesia Type: General Level of consciousness: awake and alert Pain management: pain level controlled Vital Signs Assessment: post-procedure vital signs reviewed and stable Respiratory status: spontaneous breathing, nonlabored ventilation and respiratory function stable Cardiovascular status: blood pressure returned to baseline and stable Postop Assessment: no apparent nausea or vomiting Anesthetic complications: no    Last Vitals:  Vitals:   10/05/17 1000 10/05/17 1015  BP: 111/69 103/66  Pulse: 85 86  Resp: 13 15  Temp:    SpO2: 97% 93%    Last Pain:  Vitals:   10/05/17 1000  TempSrc:   PainSc: [place]

## 2017-10-05 NOTE — Anesthesia Preprocedure Evaluation (Signed)
Anesthesia Evaluation  Patient identified by MRN, date of birth, ID band Patient awake    Reviewed: Allergy & Precautions, NPO status , Patient's Chart, lab work & pertinent test results  Airway Mallampati: II  TM Distance: >3 FB Neck ROM: Full    Dental  (+) Teeth Intact, Dental Advisory Given   Pulmonary    breath sounds clear to auscultation       Cardiovascular  Rhythm:Regular Rate:Normal     Neuro/Psych    GI/Hepatic   Endo/Other    Renal/GU      Musculoskeletal   Abdominal   Peds  Hematology  (+) anemia ,   Anesthesia Other Findings Recurrent breast cancer  Reproductive/Obstetrics                             Anesthesia Physical  Anesthesia Plan  ASA: III  Anesthesia Plan: General   Post-op Pain Management: GA combined w/ Regional for post-op pain   Induction: Intravenous  PONV Risk Score and Plan: 3 and Ondansetron, Dexamethasone and Midazolam  Airway Management Planned: LMA  Additional Equipment:   Intra-op Plan:   Post-operative Plan: Extubation in OR  Informed Consent: I have reviewed the patients History and Physical, chart, labs and discussed the procedure including the risks, benefits and alternatives for the proposed anesthesia with the patient or authorized representative who has indicated his/her understanding and acceptance.   Dental advisory given  Plan Discussed with: CRNA and Anesthesiologist  Anesthesia Plan Comments:         Anesthesia Quick Evaluation

## 2017-10-05 NOTE — Anesthesia Procedure Notes (Signed)
Procedure Name: LMA Insertion Date/Time: 10/05/2017 7:43 AM Performed by: Lyndee Leo, CRNA Pre-anesthesia Checklist: Patient identified, Emergency Drugs available, Suction available and Patient being monitored Patient Re-evaluated:Patient Re-evaluated prior to induction Oxygen Delivery Method: Circle system utilized Preoxygenation: Pre-oxygenation with 100% oxygen Induction Type: IV induction Ventilation: Mask ventilation without difficulty LMA: LMA inserted LMA Size: 4.0 Number of attempts: 1 Airway Equipment and Method: Bite block Placement Confirmation: positive ETCO2 Tube secured with: Tape Dental Injury: Teeth and Oropharynx as per pre-operative assessment

## 2017-10-05 NOTE — Interval H&P Note (Signed)
History and Physical Interval Note:  10/05/2017 7:19 AM  Mary Dougherty  has presented today for surgery, with the diagnosis of RECURRENT RIGHT BREAST CANCER  The various methods of treatment have been discussed with the patient and family. After consideration of risks, benefits and other options for treatment, the patient has consented to  Procedure(s): RIGHT MASTECTOMY WITH RIGHT SENTINEL LYMPH NODE BIOPSY,INJECT BLUE DYE RIGHT BREAST, POSSIBLE RIGHT AXILLARY LYMPH NODE DISSECTION (Right) as a surgical intervention .  The patient's history has been reviewed, patient examined, no change in status, stable for surgery.  I have reviewed the patient's chart and labs.  Questions were answered to the patient's satisfaction.     [name]

## 2017-10-05 NOTE — H&P (Signed)
Mary Dougherty is an 47 y.o. female.   Chief Complaint: breast cancer HPI: 80 yof being treated for recurrent right breast cancer. diagnosed in 2014 with right breast cancer that was er pos at 83, pr pos at 61, her 2 negative and Ki was 70%. she was given dd FEC followed by weekly taxol. her tumor was 2.7 cm on mri prior to chemotherapy. she underwent lump/sn biopsy that showed a residual 1.1 cm grade III IDC with hg dcis, 1/2 sn positive. she elected to not undergo alnd at that time. she did have radiotherapy and had been on aromasin plus zoladex. she has a pten mutation that has not been shown to be concerning per genetics counselors. she had area noted 6 months ago thought to be scarring that she just underwent 6 month f/u mm. she now feels a mass. her density is a. she has a 2 cm mass in region of prior lumpectomy. [place] confirms. core biopsy was done as well. this shows a grade III IDC that is 10% er pos, pr neg, her 2 negative with Ki of 30. mri shows left breast to be negative, all nodes appear normal and has a 2.3 cm right loq mass consistent with biopsied cancer.mri prior to chemo showed a 2.3 cm mass. she has undergone gem/carbo. was admitted for syncope related to anemia in May. she is not off chemo. repeat mri shows 2.4 cm cancer with no real change. nodes still negative. she has seen plastics and has decided now to not undergo reconstruction. she is here to schedule surgery   Past Medical History:  Diagnosis Date  . Anemia   . Blood transfusion without reported diagnosis   . Chronic anemia 03/03/13  . Dyspnea    with exertion, during chemo  . Family history of adverse reaction to anesthesia     father- hard to awaken  . Hereditary spherocytosis ([place])   . Invasive ductal carcinoma of right breast ([place]) 09/2012   ER(99%), PR(100%), Ki-67(70%), 1/5 nodes positive  . Pre-diabetes    off metformin now  . Recurrent breast cancer, right ([place]) 09/2017  . S/P radiation  therapy 05/08/2013-06/22/2013   1) Right breast / 50.4 Gy in 28 fractions / 2) Right Supraclavicular fossa/ 50.4 Gy in 28 fractions /3) Right Posterior Axillary boost / 11.9 Gy in 28 fractions /4) Right breast boost / 10 Gy in 5 fractions   . Spherocytosis ([place]) 1989  . Status post chemotherapy 10/07/12 - 12/16/12    Neoadjuvant chemotherapy with dose dense FEC (5-FU/epirubicin/Cytoxan) from 10/07/2012 through 12/16/12.    Mar[facility] Kitchen Syncope and collapse 1989   "thats when I found out I have Spherocytosis"  . Thyroid disease   . Use of exemestane (Aromasin) 07/25/13  . Use of goserelin acetate (Zoladex) 07/25/13  . Wears glasses     Past Surgical History:  Procedure Laterality Date  . BREAST LUMPECTOMY WITH SENTINEL LYMPH NODE BIOPSY Right 04/06/2013   [name]  . BREAST SURGERY     left breast lumpectomy/left ax snbx  . COLONOSCOPY  2015  . PORT-A-CATH REMOVAL N/A 04/06/2013   Procedure: REMOVAL PORT-A-CATH;  Surgeon: [name], MD;  Location: [facility];  Service: General;  Laterality: N/A;  . PORTACATH PLACEMENT Left 09/20/2012   Procedure: INSERTION PORT-A-CATH;  Surgeon: [name], MD;  Location: [facility];  Service: General;  Laterality: Left;  . PORTACATH PLACEMENT Right 05/18/2017   Procedure: INSERTION PORT-A-CATH WITH ULTRASOUND;  Surgeon: [name], MD;  Location: [facility];  Service: General;  Laterality: Right;  . WISDOM TOOTH EXTRACTION      Family History  Problem Relation Age of Onset  . Pulmonary embolism Maternal Uncle 17       died instantly  . Melanoma Cousin        paternal cousin dx in her 27s  . Heart disease Father   . Diabetes Father   . Thyroid disease Mother    Social History:  reports that she has never smoked. She has never used smokeless tobacco. She reports that she does not drink alcohol or use drugs.  Allergies: No Known Allergies  Medications Prior to Admission  Medication Sig Dispense Refill  .  folic acid (FOLVITE) 1 MG tablet Take 1 mg by mouth daily.    . polyethylene glycol (MIRALAX / GLYCOLAX) packet Take 17 g by mouth daily as needed for mild constipation or moderate constipation.    . ondansetron (ZOFRAN) 8 MG tablet Take 1 tablet (8 mg total) by mouth every 8 (eight) hours as needed for nausea. (Patient not taking: Reported on 06/26/2017) 30 tablet 3  . prochlorperazine (COMPAZINE) 10 MG tablet Take 1 tablet (10 mg total) by mouth every 6 (six) hours as needed for nausea or vomiting. (Patient not taking: Reported on 06/26/2017) 30 tablet 0    No results found for this or any previous visit (from the past 48 hour(s)). No results found.  ROS Negative  Blood pressure (!) 116/52, pulse 74, temperature 98.3 F (36.8 C), temperature source Oral, resp. rate 17, height '5\' 5"'$  (1.651 m), weight 88.3 kg, last menstrual period 10/12/2012, SpO2 98 %. Physical Exam   Vitals Geni Bers Haggett; 09/27/2017 2:14 PM) 09/27/2017 2:13 PM Weight: 193.4 lb Height: 65in Height was reported by patient. Body Surface Area: 1.95 m Body Mass Index: 32.18 kg/m  Temp.: 97.25F(Temporal)  Pulse: 98 (Regular)  BP: 118/70 (Sitting, Left Arm, Standard) Physical Exam [name] MD; 09/29/2017 11:19 PM) General Mental Status-Alert. Orientation-Oriented X3. Chest and Lung Exam Chest and lung exam reveals -quiet, even and easy respiratory effort with no use of accessory muscles and on auscultation, normal breath sounds, no adventitious sounds and normal vocal resonance. Breast Nipples-No Discharge. Breast Lump-No Palpable Breast Mass. Cardiovascular Cardiovascular examination reveals -normal heart sounds, regular rate and rhythm with no murmurs. Lymphatic Head & Neck General Head & Neck Lymphatics: Bilateral - Description - Normal. Axillary General Axillary Region: Bilateral - Description - Normal. Note: no [place] adenopathy   Assessment/Plan BREAST CANCER OF  LOWER-OUTER QUADRANT OF RIGHT FEMALE BREAST (C50.511) Story: Right mastectomy, attempt right axillary sn biopsy, poss alnd we discussed total mastectomy as only option for recurrent cancer after bct. really didnt change much by mri with chemotherapy. she does not want to pursue reconstruction at this time due to increased risk and multiple surgeries. discussed drain, risks (which are increased due to prior radiotherapy) and recovery from surgery. I will attempt a repeat sn biopsy as well. she understands and we will proceed soon  [name], MD 10/05/2017, 7:17 AM

## 2017-10-05 NOTE — Op Note (Signed)
Preoperative diagnosis: Recurrent right breast cancer status post primary chemotherapy Postoperative diagnosis: Same as above Procedure: 1.  Right total mastectomy 2.  Injection of blue dye for sentinel lymph node identification 3.  Right axillary sentinel lymph node biopsy Surgeon: Dr. Serita Grammes Estimated blood loss: Minimal Drains: 19 French Blake drain Specimens: 1.  Right breast tissue marked short superior, long lateral 2.  Right axillary sentinel nodes with highest count of 109 Sponge needle count was correct at completion Disposition to recovery in stable condition  Indications: This a 47 year old female who 5 years ago had a right breast cancer treated with breast conservation therapy.  She underwent chemotherapy and radiotherapy.  She has done well until she has had a recurrence that was mildly ER positive.  Her nodes were negative at the outset.  She is undergone chemotherapy for which she has had a fair amount of trouble with.  Her tumor is still present and is about the same size by MRI.  We discussed a mastectomy with a repeat sentinel node biopsy to be attempted.  She denied any reconstruction at this time.  Procedure: After informed consent was obtained the patient first underwent a pectoral block and injection of technetium in the standard periareolar fashion.  She was given antibiotics.  SCDs were in place.  She was then taken to the operating room and placed under general anesthesia without complication.  Her right breast and axilla were both prepped and draped in the standard sterile surgical fashion.  A surgical timeout was then performed.  I first injected the blue dye.  I injected 5 cc of a saline methylene blue dye mixture in the subareolar and upper outer quadrant.  This was then massaged for 2 minutes.  I then elected to make a anchor shaped incision to remove a fair amount of skin with this.  I made 2 limbs from above the nipple and took these out to the inframammary  fold inferiorly.  I then created flaps to the clavicle, parasternal region, inframammary fold, and latissimus laterally.  The breast and the fascia were then removed in total.  This was marked as above.  There were no sentinel nodes in the breast.  She did have what appeared to be 1 or 2 sentinel lymph nodes in her axilla by the neoprobe.  These were excised.  There was no more background radioactivity.  There is no blue dye present.  I then obtained hemostasis.  I did remove some extra skin laterally and superiorly.  I placed a 93 [place] Blake drain and secured this with a 2-0 nylon suture.  I then closed this with 3-0 Vicryl and 4-0 Monocryl.  Glue and Steri-Strips were applied.  The skin was all viable upon completion.  A binder was placed.  She was extubated and transferred to the recovery room stable.

## 2017-10-05 NOTE — Transfer of Care (Signed)
Immediate Anesthesia Transfer of Care Note  Patient: Mary Dougherty  Procedure(s) Performed: RIGHT MASTECTOMY WITH RIGHT SENTINEL LYMPH NODE BIOPSY (Right Breast)  Patient Location: PACU  Anesthesia Type:GA combined with regional for post-op pain  Level of Consciousness: awake, oriented and patient cooperative  Airway & Oxygen Therapy: Patient Spontanous Breathing and Patient connected to face mask oxygen  Post-op Assessment: Report given to RN and Post -op Vital signs reviewed and stable  Post vital signs: Reviewed and stable  Last Vitals:  Vitals Value Taken Time  BP 103/66 10/05/2017  9:23 AM  Temp    Pulse 92 10/05/2017  9:24 AM  Resp 13 10/05/2017  9:24 AM  SpO2 98 % 10/05/2017  9:24 AM  Vitals shown include unvalidated device data.  Last Pain:  Vitals:   10/05/17 0642  TempSrc: Oral  PainSc: 0-No pain         Complications: No apparent anesthesia complications

## 2017-10-06 ENCOUNTER — Encounter (HOSPITAL_BASED_OUTPATIENT_CLINIC_OR_DEPARTMENT_OTHER): Payer: Self-pay | Admitting: General Surgery

## 2017-10-06 MED ORDER — OXYCODONE HCL 5 MG PO TABS: 5.0000 mg | ORAL_TABLET | Freq: Four times a day (QID) | ORAL | 0 refills | Status: DC | PRN

## 2017-10-06 NOTE — Discharge Instructions (Signed)
[facility], [place] [phone]  MASTECTOMY: POST OP INSTRUCTIONS  Always review your discharge instruction sheet given to you by the facility where your surgery was performed. IF YOU HAVE DISABILITY OR FAMILY LEAVE FORMS, YOU MUST BRING THEM TO THE OFFICE FOR PROCESSING.   DO NOT GIVE THEM TO YOUR DOCTOR. A prescription for pain medication may be given to you upon discharge.  Take your pain medication as prescribed, if needed.  If narcotic pain medicine is not needed, then you may take acetaminophen (Tylenol), naprosyn (Alleve) or ibuprofen (Advil) as needed. *1,000mg  Tylenol given at 7:30am* 1. Take your usually prescribed medications unless otherwise directed. 2. If you need a refill on your pain medication, please contact your pharmacy.  They will contact our office to request authorization.  Prescriptions will not be filled after 5pm or on week-ends. 3. You should follow a light diet the first few days after arrival home, such as soup and crackers, etc.  Resume your normal diet the day after surgery. 4. Most patients will experience some swelling and bruising on the chest and underarm.  Ice packs will help.  Swelling and bruising can take several days to resolve. Wear the binder day and night until you return to the office.  5. It is common to experience some constipation if taking pain medication after surgery.  Increasing fluid intake and taking a stool softener (such as Colace) will usually help or prevent this problem from occurring.  A mild laxative (Milk of Magnesia or Miralax) should be taken according to package instructions if there are no bowel movements after 48 hours. 6. Unless discharge instructions indicate otherwise, leave your bandage dry and in place until your next appointment in 3-5 days.  You may take a limited sponge bath.  No tube baths or showers until the drains are removed.  You may have steri-strips (small skin tapes) in place directly over the incision.   These strips should be left on the skin for 7-10 days. If you have glue it will come off in next couple week.  Any sutures will be removed at an office visit 7. DRAINS:  If you have drains in place, it is important to keep a list of the amount of drainage produced each day in your drains.  Before leaving the hospital, you should be instructed on drain care.  Call our office if you have any questions about your drains. I will remove your drains when they put out less than 30 cc or ml for 2 consecutive days. 8. ACTIVITIES:  You may resume regular (light) daily activities beginning the next day--such as daily self-care, walking, climbing stairs--gradually increasing activities as tolerated.  You may have sexual intercourse when it is comfortable.  Refrain from any heavy lifting or straining until approved by your doctor. a. You may drive when you are no longer taking prescription pain medication, you can comfortably wear a seatbelt, and you can safely maneuver your car and apply brakes. b. RETURN TO WORK:  __________________________________________________________ 9. You should see your doctor in the office for a follow-up appointment approximately 3-5 days after your surgery.  Your doctors nurse will typically make your follow-up appointment when she calls you with your pathology report.  Expect your pathology report 3-4business days after surgery. 10. OTHER INSTRUCTIONS: ______________________________________________________________________________________________ ____________________________________________________________________________________________ WHEN TO CALL YOUR DR [place]: 1. Fever over 101.0 2. Nausea and/or vomiting 3. Extreme swelling or bruising 4. Continued bleeding from incision. 5. Increased pain, redness, or drainage from the incision.  The clinic staff is available to answer your questions during regular business hours.  Please dont hesitate to call and ask to speak to one of the  nurses for clinical concerns.  If you have a medical emergency, go to the nearest emergency room or call 911.  A surgeon from [facility] is always on call at the hospital. [address], [place], [place], [place]  [address] ? [address], [place], [place]   [address] [phone] ? [phone] ? FAX [phone] Web site: [email or web address]   Post Anesthesia Home Care Instructions  Activity: Get plenty of rest for the remainder of the day. A responsible individual must stay with you for 24 hours following the procedure.  For the next 24 hours, DO NOT: -Drive a car -Paediatric nurse -Drink alcoholic beverages -Take any medication unless instructed by your physician -Make any legal decisions or sign important papers.  Meals: Start with liquid foods such as gelatin or soup. Progress to regular foods as tolerated. Avoid greasy, spicy, heavy foods. If nausea and/or vomiting occur, drink only clear liquids until the nausea and/or vomiting subsides. Call your physician if vomiting continues.  Special Instructions/Symptoms: Your throat may feel dry or sore from the anesthesia or the breathing tube placed in your throat during surgery. If this causes discomfort, gargle with warm salt water. The discomfort should disappear within 24 hours.  If you had a scopolamine patch placed behind your ear for the management of post- operative nausea and/or vomiting:  1. The medication in the patch is effective for 72 hours, after which it should be removed.  Wrap patch in a tissue and discard in the trash. Wash hands thoroughly with soap and water. 2. You may remove the patch earlier than 72 hours if you experience unpleasant side effects which may include dry mouth, dizziness or visual disturbances. 3. Avoid touching the patch. Wash your hands with soap and water after contact with the patch.   About my Jackson-Pratt Bulb Drain  What is a Jackson-Pratt bulb? A  Jackson-Pratt is a soft, round device used to collect drainage. It is connected to a long, thin drainage catheter, which is held in place by one or two small stiches near your surgical incision site. When the bulb is squeezed, it forms a vacuum, forcing the drainage to empty into the bulb.  Emptying the Jackson-Pratt bulb- To empty the bulb: 1. Release the plug on the top of the bulb. 2. Pour the bulb's contents into a measuring container which your nurse will provide. 3. Record the time emptied and amount of drainage. Empty the drain(s) as often as your     doctor or nurse recommends.  Date                  Time                    Amount (Drain 1)                 Amount (Drain 2)  _____________________________________________________________________  _____________________________________________________________________  _____________________________________________________________________  _____________________________________________________________________  _____________________________________________________________________  _____________________________________________________________________  _____________________________________________________________________  _____________________________________________________________________  Squeezing the Jackson-Pratt Bulb- To squeeze the bulb: 1. Make sure the plug at the top of the bulb is open. 2. Squeeze the bulb tightly in your fist. You will hear air squeezing from the bulb. 3. Replace the plug while the bulb is squeezed. 4. Use a safety pin to attach the bulb to your clothing. This will keep the  catheter from     pulling at the bulb insertion site.  When to call your doctor- Call your doctor if:  Drain site becomes red, swollen or hot.  You have a fever greater than 101 degrees F.  There is oozing at the drain site.  Drain falls out (apply a guaze bandage over the drain hole and secure it with tape).  Drainage  increases daily not related to activity patterns. (You will usually have more drainage when you are active than when you are resting.)  Drainage has a bad odor.

## 2017-10-06 NOTE — Discharge Summary (Signed)
Physician Discharge Summary  Patient ID: Mary Dougherty MRN: [id number] DOB/AGE: [birth year] 49 y.o.  Admit date: 10/05/2017 Discharge date: 10/06/2017  Admission Diagnoses: Breast cancer  Discharge Diagnoses:  Active Problems:   Breast cancer, right [facility])   Discharged Condition: good  Hospital Course: 47 yof s/p right mastectomy/sn biopsy for recurrent breast cancer after primary chemotherapy.  She has done well and will be discharged.  Drain is as expected.   Consults: None  Significant Diagnostic Studies: none  Treatments: surgery: right mastectomy/sn biopsy  Discharge Exam: Blood pressure (!) 97/53, pulse 66, temperature (!) 97.1 F (36.2 C), resp. rate 16, height 5\' 5"  (1.651 m), weight 88.3 kg, last menstrual period 10/12/2012, SpO2 95 %. flaps viable, no hematoma drain as expected  Disposition: Discharge disposition: 01-Home or Self Care        Allergies as of 10/06/2017   No Known Allergies     Medication List    TAKE these medications   folic acid 1 MG tablet Commonly known as:  FOLVITE Take 1 mg by mouth daily.   ondansetron 8 MG tablet Commonly known as:  ZOFRAN Take 1 tablet (8 mg total) by mouth every 8 (eight) hours as needed for nausea.   oxyCODONE 5 MG immediate release tablet Commonly known as:  Oxy IR/ROXICODONE Take 1 tablet (5 mg total) by mouth every 6 (six) hours as needed for moderate pain, severe pain or breakthrough pain.   polyethylene glycol packet Commonly known as:  MIRALAX / GLYCOLAX Take 17 g by mouth daily as needed for mild constipation or moderate constipation.   prochlorperazine 10 MG tablet Commonly known as:  COMPAZINE Take 1 tablet (10 mg total) by mouth every 6 (six) hours as needed for nausea or vomiting.      Follow-up Information    [name], MD In 2 weeks.   Specialty:  General Surgery Contact information: [address] [address] [phone]            Signed: [name] 10/06/2017, 9:27 AM

## 2017-10-20 ENCOUNTER — Telehealth: Payer: Self-pay | Admitting: Hematology and Oncology

## 2017-10-20 NOTE — Telephone Encounter (Signed)
Mailed calendar to patient for added 9/23 appt.

## 2017-10-20 NOTE — Telephone Encounter (Signed)
Per VG 9/17 sch msg.  Called patient and left message regarding appt with VG on 9/23 as requested by VG.

## 2017-10-25 ENCOUNTER — Telehealth: Payer: Self-pay | Admitting: Hematology and Oncology

## 2017-10-25 ENCOUNTER — Inpatient Hospital Stay: Payer: BLUE CROSS/BLUE SHIELD | Attending: Hematology and Oncology | Admitting: Hematology and Oncology

## 2017-10-25 DIAGNOSIS — C50511 Malignant neoplasm of lower-outer quadrant of right female breast: Secondary | ICD-10-CM | POA: Diagnosis not present

## 2017-10-25 DIAGNOSIS — Z9011 Acquired absence of right breast and nipple: Secondary | ICD-10-CM | POA: Diagnosis not present

## 2017-10-25 DIAGNOSIS — Z170 Estrogen receptor positive status [ER+]: Secondary | ICD-10-CM | POA: Diagnosis not present

## 2017-10-25 MED ORDER — EXEMESTANE 25 MG PO TABS: 25.0000 mg | ORAL_TABLET | Freq: Every day | ORAL | Status: DC

## 2017-10-25 NOTE — Telephone Encounter (Signed)
Gave pt avs and calendar  °

## 2017-10-25 NOTE — Progress Notes (Signed)
Patient Care Team: Forrest Moron, MD as PCP - General (Internal Medicine) Nicholas Lose, MD as Consulting Physician (Hematology and Oncology)  DIAGNOSIS:  Encounter Diagnosis  Name Primary?  . Malignant neoplasm of lower-outer quadrant of right breast of female, estrogen receptor positive ([place])     SUMMARY OF ONCOLOGIC HISTORY:   Breast cancer of lower-outer quadrant of right female breast ([place])   09/08/2012 Initial Diagnosis    Breast cancer of lower-outer quadrant of right female breast: ER 99% PR 100% HER-2 negative, Ki-67 70%    10/07/2012 - 03/17/2013 Neo-Adjuvant Chemotherapy    Dose dense FEC followed by weekly Taxol    04/06/2013 Surgery    Right breast lumpectomy residual 1.1 cm IDC grade 3 with high-grade DCIS, PNI +ve, 1/2 SLN positive, no MVI, T1 C. N1 A. stage IIA, ER 97% PR 95% HER-2 1.1 ratio negative    05/08/2013 - 06/22/2013 Radiation Therapy    Radiation therapy to lumpectomy site    07/03/2013 - 04/27/2017 Anti-estrogen oral therapy    Zoladex plus Aromasin     Procedure    Genetic testing:PTEN C.-([id number])del 26 on [employer] testing    04/15/2017 Relapse/Recurrence    Right breast biopsy: Invasive ductal carcinoma, grade 3, ER 10%, PR 0%, Ki-67 30%, HER-2 equivocal, ratio 1.36, copy #5.2, IHC 1+ negative    04/22/2017 Breast MRI    Malignancy LOQ right breast 2.3 cm within the lumpectomy scar, left breast normal    06/02/2017 - 09/03/2017 Neo-Adjuvant Chemotherapy    Carboplatin and gemcitabine neoadjuvant chemotherapy    06/11/2017 - 06/12/2017 Hospital Admission    Syncope due to anemia required blood transfusion    09/23/2017 Breast MRI    Persistent enhancement right breast malignancy at 6:00 2.4 x 1.7 x 0.8 cm enhancement in the right nipple could be physiologic     10/05/2017 Surgery    Right mastectomy: IDC grade 3, 3 cm, with high-grade DCIS, margins negative, 0/2 lymph nodes negative, ER 10%, PR 0%, HER-2 negative, Ki-67 30% ypT2ypN0      Malignant  neoplasm of lower-outer quadrant of right breast of female, estrogen receptor positive (H[facility])    CHIEF COMPLIANT: Follow-up after right mastectomy  INTERVAL HISTORY: Mary Dougherty is a 36-year with above-mentioned history of triple negative recurrent breast cancer who recently underwent right mastectomy and is here today to discuss the pathology report.  She is recovering from the recent surgery.  She is on moderate discomfort.  REVIEW OF SYSTEMS:   Constitutional: Denies fevers, chills or abnormal weight loss Eyes: Denies blurriness of vision Ears, nose, mouth, throat, and face: Denies mucositis or sore throat Respiratory: Denies cough, dyspnea or wheezes Cardiovascular: Denies palpitation, chest discomfort Gastrointestinal:  Denies nausea, heartburn or change in bowel habits Skin: Denies abnormal skin rashes Lymphatics: Denies new lymphadenopathy or easy bruising Neurological:Denies numbness, tingling or new weaknesses Behavioral/Psych: Mood is stable, no new changes  Extremities: No lower extremity edema Breast: Right mastectomy with some moderate degree of pain and discomfort All other systems were reviewed with the patient and are negative.  I have reviewed the past medical history, past surgical history, social history and family history with the patient and they are unchanged from previous note.  ALLERGIES:  has No Known Allergies.  MEDICATIONS:  Current Outpatient Medications  Medication Sig Dispense Refill  . exemestane (AROMASIN) 25 MG tablet Take 1 tablet (25 mg total) by mouth daily after breakfast.    . folic acid (FOLVITE) 1 MG tablet Take  1 mg by mouth daily.     No current facility-administered medications for this visit.     PHYSICAL EXAMINATION: ECOG PERFORMANCE STATUS: 1 - Symptomatic but completely ambulatory  Vitals:   10/25/17 1020  BP: (!) 119/57  Pulse: 83  Resp: 18  Temp: 98.7 F (37.1 C)  SpO2: 99%   Filed Weights   10/25/17 1020  Weight:  193 lb 14.4 oz (88 kg)    GENERAL:alert, no distress and comfortable SKIN: skin color, texture, turgor are normal, no rashes or significant lesions EYES: normal, Conjunctiva are pink and non-injected, sclera clear OROPHARYNX:no exudate, no erythema and lips, buccal mucosa, and tongue normal  NECK: supple, thyroid normal size, non-tender, without nodularity LYMPH:  no palpable lymphadenopathy in the cervical, axillary or inguinal LUNGS: clear to auscultation and percussion with normal breathing effort HEART: regular rate & rhythm and no murmurs and no lower extremity edema ABDOMEN:abdomen soft, non-tender and normal bowel sounds MUSCULOSKELETAL:no cyanosis of digits and no clubbing  NEURO: alert & oriented x 3 with fluent speech, no focal motor/sensory deficits EXTREMITIES: No lower extremity edema   LABORATORY DATA:  I have reviewed the data as listed CMP Latest Ref Rng & Units 09/24/2017 09/10/2017 09/03/2017  Glucose 70 - 99 mg/dL 134(H) 167(H) 214(H)  BUN 6 - 20 mg/dL '7 9 8  '$ Creatinine 0.44 - 1.00 mg/dL 0.61 0.65 0.76  Sodium 135 - 145 mmol/L 141 139 140  Potassium 3.5 - 5.1 mmol/L 4.0 4.0 3.9  Chloride 98 - 111 mmol/L 106 105 104  CO2 22 - 32 mmol/L '29 25 27  '$ Calcium 8.9 - 10.3 mg/dL 9.0 8.5(L) 8.6(L)  Total Protein 6.5 - 8.1 g/dL 7.2 7.0 6.9  Total Bilirubin 0.3 - 1.2 mg/dL 1.1 1.0 0.8  Alkaline Phos 38 - 126 U/L 115 116 119  AST 15 - 41 U/L 23 54(H) 21  ALT 0 - 44 U/L 44 168(H) 42    Lab Results  Component Value Date   WBC 8.8 10/05/2017   HGB 10.3 (L) 10/05/2017   HCT 30.2 (L) 10/05/2017   MCV 91.0 10/05/2017   PLT 90 (L) 10/05/2017   NEUTROABS 4.2 09/24/2017    ASSESSMENT & PLAN:  Breast cancer of lower-outer quadrant of right female breast 2014: Right breast invasive ductal carcinoma ER/PR positive HER-2 negative stage II A. T3, N1, M0 with PTEN mutation Priortreatment: Neoadjuvant chemo with FEC Taxol right lumpectomy, radiation, adjuvant Zoladex and exemestane  started 07/03/2013 ------------------------------------------------------------------------------ Breast MRI 04/22/2017: 2.3 cm malignancy lower outer quadrant right breast within the lumpectomy scar  Recommendation: 1.Neoadjuvant chemotherapy with carboplatin and gemcitabine day 1 and 8 every 3 weeks for 6 cycles 2.followed by mastectomy 3.Followed by continuedantiestrogen therapy  PET/CT scan: Negative for distant metastasis -------------------------------------------------------------------------------- 10/05/2017:Right mastectomy: IDC grade 3, 3 cm, with high-grade DCIS, margins negative, 0/2 lymph nodes negative, ER 10%, PR 0%, HER-2 negative, Ki-67 30% ypT2ypN0   Pathology counseling: I discussed the final pathology report of the patient provided  a copy of this report. I discussed the margins as well as lymph node surgeries. We also discussed the final staging along with previously performed ER/PR and HER-2/neu testing.  Since the tumor is still faintly estrogen receptor positive, she will go back on continue antiestrogen therapy for 5 more years Patient would like to get a oophorectomy and hysterectomy done as soon as possible so that she does not have to take Zoladex injections. I discussed with her that she needs to resume exemestane at this time.  We will request Dr. Ronita Hipps to remove her port.  Return to clinic in 6 months for follow-up   Orders Placed This Encounter  Procedures  . CBC with Differential (Cancer Center Only)    Standing Status:   Future    Standing Expiration Date:   10/26/2018  . CMP ([facility] only)    Standing Status:   Future    Standing Expiration Date:   10/26/2018   The patient has a good understanding of the overall plan. she agrees with it. she will call with any problems that may develop before the next visit here.   Harriette Ohara, MD 10/25/17

## 2017-10-25 NOTE — Assessment & Plan Note (Signed)
2014: Right breast invasive ductal carcinoma ER/PR positive HER-2 negative stage II A. T3, N1, M0 with PTEN mutation Priortreatment: Neoadjuvant chemo with FEC Taxol right lumpectomy, radiation, adjuvant Zoladex and exemestane started 07/03/2013 ------------------------------------------------------------------------------ Breast MRI 04/22/2017: 2.3 cm malignancy lower outer quadrant right breast within the lumpectomy scar  Recommendation: 1.Neoadjuvant chemotherapy with carboplatin and gemcitabine day 1 and 8 every 3 weeks for 6 cycles 2.followed by mastectomy 3.Followed by continuedantiestrogen therapy  PET/CT scan: Negative for distant metastasis -------------------------------------------------------------------------------- 10/05/2017:Right mastectomy: IDC grade 3, 3 cm, with high-grade DCIS, margins negative, 0/2 lymph nodes negative, ER 10%, PR 0%, HER-2 negative, Ki-67 30% ypT2ypN0   Pathology counseling: I discussed the final pathology report of the patient provided  a copy of this report. I discussed the margins as well as lymph node surgeries. We also discussed the final staging along with previously performed ER/PR and HER-2/neu testing.  Since the tumor is still faintly estrogen receptor positive, she will go back on continue antiestrogen therapy for 5 more years

## 2017-10-28 ENCOUNTER — Ambulatory Visit: Payer: BLUE CROSS/BLUE SHIELD | Attending: General Surgery | Admitting: Physical Therapy

## 2017-10-28 ENCOUNTER — Encounter: Payer: Self-pay | Admitting: Physical Therapy

## 2017-10-28 DIAGNOSIS — M25621 Stiffness of right elbow, not elsewhere classified: Secondary | ICD-10-CM

## 2017-10-28 DIAGNOSIS — M79621 Pain in right upper arm: Secondary | ICD-10-CM | POA: Insufficient documentation

## 2017-10-28 DIAGNOSIS — R293 Abnormal posture: Secondary | ICD-10-CM | POA: Diagnosis present

## 2017-10-28 DIAGNOSIS — Z483 Aftercare following surgery for neoplasm: Secondary | ICD-10-CM

## 2017-10-28 DIAGNOSIS — M6281 Muscle weakness (generalized): Secondary | ICD-10-CM | POA: Diagnosis present

## 2017-10-28 NOTE — Patient Instructions (Signed)
SHOULDER: Flexion - Supine (Cane)        Cancer Rehab [phone]    Hold cane in both hands. Raise arms up overhead. Do not allow back to arch. Hold _5__ seconds. Do __5-10__ times; __1-2__ times a day.   SELF ASSISTED WITH OBJECT: Shoulder Abduction / Adduction - Supine    Hold cane with both hands. Move both arms from side to side, keep elbows straight.  Hold when stretch felt for __5__ seconds. Repeat __5-10__ times; __1-2__ times a day. Once this becomes easier progress to third picture bringing affected arm towards ear by staying out to side. Same hold for _5_seconds. Repeat  _5-10_ times, _1-2_ times/day.  Shoulder Blade Stretch    Clasp fingers behind head with elbows touching in front of face. Pull elbows back while pressing shoulder blades together. Relax and hold as tolerated, can place pillow under elbow here for comfort as needed and to allow for prolonged stretch.  Repeat __5__ times. Do __1-2__ sessions per day.      Copyright  VHI. All rights reserved.       

## 2017-10-28 NOTE — Therapy (Signed)
[place], [place], [address] Phone: [phone]   Fax:  [phone]  Physical Therapy Evaluation  Patient Details  Name: Mary Dougherty MRN: [id number] Date of Birth: 10/12/1970 Referring Provider (PT): Dr. Donne Hazel    Encounter Date: 10/28/2017  PT End of Session - 10/28/17 1728    Visit Number  1    Number of Visits  9    Date for PT Re-Evaluation  12/02/17    PT Start Time  1430    PT Stop Time  1515    PT Time Calculation (min)  45 min    Activity Tolerance  Patient tolerated treatment well    Behavior During Therapy  [facility] for tasks assessed/performed       Past Medical History:  Diagnosis Date  . Anemia   . Blood transfusion without reported diagnosis   . Chronic anemia 03/03/13  . Dyspnea    with exertion, during chemo  . Family history of adverse reaction to anesthesia     father- hard to awaken  . Hereditary spherocytosis ([place])   . Invasive ductal carcinoma of right breast ([place]) 09/2012   ER(99%), PR(100%), Ki-67(70%), 1/5 nodes positive  . Pre-diabetes    off metformin now  . Recurrent breast cancer, right ([place]) 09/2017  . S/P radiation therapy 05/08/2013-06/22/2013   1) Right breast / 50.4 Gy in 28 fractions / 2) Right Supraclavicular fossa/ 50.4 Gy in 28 fractions /3) Right Posterior Axillary boost / 11.9 Gy in 28 fractions /4) Right breast boost / 10 Gy in 5 fractions   . Spherocytosis ([place]) 1989  . Status post chemotherapy 10/07/12 - 12/16/12    Neoadjuvant chemotherapy with dose dense FEC (5-FU/epirubicin/Cytoxan) from 10/07/2012 through 12/16/12.    Marland Kitchen Syncope and collapse 1989   "thats when I found out I have Spherocytosis"  . Thyroid disease   . Use of exemestane (Aromasin) 07/25/13  . Use of goserelin acetate (Zoladex) 07/25/13  . Wears glasses     Past Surgical History:  Procedure Laterality Date  . BREAST LUMPECTOMY WITH SENTINEL LYMPH NODE BIOPSY Right 04/06/2013    [name]  . BREAST SURGERY     left breast lumpectomy/left ax snbx  . COLONOSCOPY  2015  . MASTECTOMY W/ SENTINEL NODE BIOPSY Right 10/05/2017   Procedure: RIGHT MASTECTOMY WITH RIGHT SENTINEL LYMPH NODE BIOPSY;  Surgeon: [name], MD;  Location: [facility];  Service: General;  Laterality: Right;  . PORT-A-CATH REMOVAL N/A 04/06/2013   Procedure: REMOVAL PORT-A-CATH;  Surgeon: [name], MD;  Location: [facility];  Service: General;  Laterality: N/A;  . PORTACATH PLACEMENT Left 09/20/2012   Procedure: INSERTION PORT-A-CATH;  Surgeon: [name], MD;  Location: [facility];  Service: General;  Laterality: Left;  . PORTACATH PLACEMENT Right 05/18/2017   Procedure: INSERTION PORT-A-CATH WITH ULTRASOUND;  Surgeon: [name], MD;  Location: [facility];  Service: General;  Laterality: Right;  . WISDOM TOOTH EXTRACTION      There were no vitals filed for this visit.   Subjective Assessment - 10/28/17 1450    Subjective  Tightness in her right axilla that goes down into forearm when she reaches up and out with her right arm     Patient is accompained by:  Family member    Pertinent History  pt had recurrance of breast cancer in her scar tissue that chemo ( from May 3-Aug 9)  did not affect. she underwent a  right mastectomy with 2 nodes removed on 10/06/2017.  Previous right cancer in 2015 with lumpectomy chemo radiation.  Pt needs to have a hysterectomy within the next month     Patient Stated Goals  Be able to raise her arm over her head and shave under it     Currently in Pain?  No/denies   when she reaches to a 4 or 5        [facility] PT Assessment - 10/28/17 0001      Assessment   Medical Diagnosis  right breast cacer     Referring Provider (PT)  Dr. Donne Hazel     Onset Date/Surgical Date  10/06/17    Hand Dominance  Right   very right handed      Precautions   Precautions  None      Balance Screen   Has the  patient fallen in the past 6 months  No    Has the patient had a decrease in activity level because of a fear of falling?   No    Is the patient reluctant to leave their home because of a fear of falling?   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     Quick DASH   25      Observation/Other Assessments-Edema    Edema  --   pt has fullness over incision      Sensation   Light Touch  Impaired by gross assessment    Additional Comments  pt feels numbness under right upper arm       Coordination   Gross Motor Movements are Fluid and Coordinated  No   limited by pain in right axilla      Posture/Postural Control   Posture/Postural Control  Postural limitations    Postural Limitations  Rounded Shoulders;Forward head      AROM   Right/Left Shoulder  Right;Left    Right Shoulder Flexion  125 Degrees    Right Shoulder ABduction  90 Degrees    Right Shoulder External Rotation  90 Degrees    Left Shoulder Flexion  165 Degrees    Left Shoulder ABduction  170 Degrees    Left Shoulder External Rotation  90 Degrees      Strength   Overall Strength Comments  limited by pain in right shoulder genrealized muscle atrophy      Palpation   Palpation comment  fullness at anterior chest near axilla above incision guitar string axillary cording in axilla with pulling down into upper arm .         LYMPHEDEMA/ONCOLOGY QUESTIONNAIRE - 10/28/17 1508      Type   Cancer Type   recurrent right breast       Right Upper Extremity Lymphedema   15 cm Proximal to Olecranon Process  33.5 cm  Olecranon Process  27 cm    15 cm Proximal to Ulnar Styloid Process  26.5 cm    Just Proximal to Ulnar Styloid Process  17 cm    Across Hand at [employer]  21.5 cm    At [place] of 2nd Digit  6.7 cm      Left Upper Extremity Lymphedema   15 cm Proximal to Olecranon Process  31.5 cm    Olecranon Process  26.5 cm    15 cm Proximal to Ulnar Styloid Process  25 cm    Just Proximal to Ulnar Styloid Process  16.5 cm    Across Hand at [employer]  20 cm    At [place] of 2nd Digit  6.5 cm          Quick Dash - 10/28/17 0001    Open a tight or new jar  Mild difficulty    Do heavy household chores (wash walls, wash floors)  Moderate difficulty    Carry a shopping bag or briefcase  Moderate difficulty    Wash your back  Mild difficulty    Use a knife to cut food  No difficulty    Recreational activities in which you take some force or impact through your arm, shoulder, or hand (golf, hammering, tennis)  Moderate difficulty    During the past week, to what extent has your arm, shoulder or hand problem interfered with your normal social activities with family, friends, neighbors, or groups?  Slightly    During the past week, to what extent has your arm, shoulder or hand problem limited your work or other regular daily activities  Slightly    Arm, shoulder, or hand pain.  Mild    Tingling (pins and needles) in your arm, shoulder, or hand  None    Difficulty Sleeping  No difficulty    DASH Score  25 %        Objective measurements completed on examination: See above findings.      [facility] Adult PT Treatment/Exercise - 10/28/17 0001      Self-Care   Self-Care  Other Self-Care Comments    Other Self-Care Comments   provided Tg soft to right upper arm       Exercises   Exercises  Shoulder      Shoulder Exercises: Supine   Other Supine Exercises  dowel rod flexion and  abduction 5 repetitions       Manual Therapy   Manual Therapy  Edema management    Edema Management  script for prophylactic sleeve sent to Dr. Donne Hazel                   PT Long Term Goals - 10/28/17 1736      PT LONG TERM GOAL #1   Title  Pt will have 150 degrees of right shoulder abduction so that she will be able to do ADLS easier     Baseline  90    Time  4    Period  Weeks    Status  New      PT LONG TERM GOAL #2   Title  Pt will have HEP for shoulder range of motion and strength     Time  4    Period  Weeks    Status  New      PT LONG TERM GOAL #3   Title  Pt will verbalized lymphedema risk reduction precautions     Period  Weeks  Status  New      PT LONG TERM GOAL #4   Title  Pt will report the pain and fullness in her chest and axilla is decreased by 50%    Time  4    Period  Weeks    Status  New             Plan - 10/28/17 1729    Clinical Impression Statement  Pt presents with decreased range of motion and strength of right arm, fullness in right chest and upper arm, and right axillary cording  after right mastectomy.     History and Personal Factors relevant to plan of care:  previous radiation and sentinel node biopsy of right axilla, 2 episodes of previous chemo     Clinical Presentation  Stable    Clinical Decision Making  Low    Rehab Potential  Good    Clinical Impairments Affecting Rehab Potential   as above     PT Frequency  2x / week    PT Duration  4 weeks    PT Treatment/Interventions  ADLs/Self Care Home Management;DME Instruction;Therapeutic exercise;Therapeutic activities;Patient/family education;Orthotic Fit/Training;Manual techniques;Passive range of motion;Scar mobilization;Compression bandaging;Manual lymph drainage;Taping    PT Next Visit Plan  manual lymph drainage to right chest and upper arm, AAROM/PROM to right UE . 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, teach lymphedema risk reduction or make sure she is signed up for ABC class      Consulted and Agree with Plan of Care  Patient       Patient will benefit from skilled therapeutic intervention in order to improve the following deficits and impairments:  Decreased knowledge of use of DME, Decreased range of motion, Increased edema, Decreased strength, Decreased skin integrity, Increased fascial restricitons, Pain, Postural dysfunction, Decreased scar mobility, Impaired UE functional use  Visit Diagnosis: Aftercare following surgery for neoplasm - Plan: PT plan of care cert/re-cert  Stiffness of right upper arm joint - Plan: PT plan of care cert/re-cert  Pain in right upper arm - Plan: PT plan of care cert/re-cert  Abnormal posture - Plan: PT plan of care cert/re-cert  Muscle weakness (generalized) - Plan: PT plan of care cert/re-cert     Problem List Patient Active Problem List   Diagnosis Date Noted  . Breast cancer, right ([place]) 10/05/2017  . Port-A-Cath in place 08/13/2017  . Type 2 diabetes mellitus with complication, without long-term current use of insulin ([place]) 06/26/2017  . Elevated bilirubin 06/26/2017  . Neoplastic malignant related fatigue 06/26/2017  . Syncope 06/11/2017  . Anemia 06/11/2017  . Diabetes mellitus without complication ([place]) 00/71/2197  . Malignant neoplasm of lower-outer quadrant of right breast of female, estrogen receptor positive ([place]) 04/28/2017  . HS (hereditary spherocytosis) ([place]) 09/20/2013  . Anemia, unspecified 01/27/2013  . Syncope, vasovagal 01/17/2013    Class: Chronic  . Genital herpes 10/14/2012  . Breast cancer of lower-outer quadrant of right female breast ([place]) 09/08/2012   Donato Heinz. Owens Shark PT  [place] Levo 10/28/2017, 5:41 PM  [place], [place], [address] Phone: [phone]   Fax:  [phone]  Name: Mary Dougherty MRN: [id number] Date of Birth: [birth year]

## 2017-11-03 NOTE — Progress Notes (Signed)
EKG 06-11-17 epic   CXR 06-11-17 epic

## 2017-11-03 NOTE — Patient Instructions (Addendum)
Mary Dougherty  11/03/2017   Your procedure is scheduled on: 11-08-17   Report to [facility] Main  Entrance    Report to admitting at 6:00AM    Call this number if you have problems the morning of surgery [phone]     Remember: Do not eat food or drink liquids :After Midnight. BRUSH YOUR TEETH MORNING OF SURGERY AND RINSE YOUR MOUTH OUT, NO CHEWING GUM CANDY OR MINTS.     Take these medicines the morning of surgery with A SIP OF WATER: none                                 You may not have any metal on your body including hair pins and              piercings  Do not wear jewelry, make-up, lotions, powders or perfumes, deodorant             Do not wear nail polish.  Do not shave  48 hours prior to surgery.     Do not bring valuables to the hospital. [place].  Contacts, dentures or bridgework may not be worn into surgery.  Leave suitcase in the car. After surgery it may be brought to your room.                 Please read over the following fact sheets you were given: _____________________________________________________________________             [facility] - Preparing for Surgery Before surgery, you can play an important role.  Because skin is not sterile, your skin needs to be as free of germs as possible.  You can reduce the number of germs on your skin by washing with CHG (chlorahexidine gluconate) soap before surgery.  CHG is an antiseptic cleaner which kills germs and bonds with the skin to continue killing germs even after washing. Please DO NOT use if you have an allergy to CHG or antibacterial soaps.  If your skin becomes reddened/irritated stop using the CHG and inform your nurse when you arrive at Short Stay. Do not shave (including legs and underarms) for at least 48 hours prior to the first CHG shower.  You may shave your face/neck. Please follow these instructions carefully:  1.   Shower with CHG Soap the night before surgery and the  morning of Surgery.  2.  If you choose to wash your hair, wash your hair first as usual with your  normal  shampoo.  3.  After you shampoo, rinse your hair and body thoroughly to remove the  shampoo.                           4.  Use CHG as you would any other liquid soap.  You can apply chg directly  to the skin and wash                       Gently with a scrungie or clean washcloth.  5.  Apply the CHG Soap to your body ONLY FROM THE NECK DOWN.   Do not use on face/ open  Wound or open sores. Avoid contact with eyes, ears mouth and genitals (private parts).                       Wash face,  Genitals (private parts) with your normal soap.             6.  Wash thoroughly, paying special attention to the area where your surgery  will be performed.  7.  Thoroughly rinse your body with warm water from the neck down.  8.  DO NOT shower/wash with your normal soap after using and rinsing off  the CHG Soap.                9.  Pat yourself dry with a clean towel.            10.  Wear clean pajamas.            11.  Place clean sheets on your bed the night of your first shower and do not  sleep with pets. Day of Surgery : Do not apply any lotions/deodorants the morning of surgery.  Please wear clean clothes to the hospital/surgery center.  FAILURE TO FOLLOW THESE INSTRUCTIONS MAY RESULT IN THE CANCELLATION OF YOUR SURGERY PATIENT SIGNATURE_________________________________  NURSE SIGNATURE__________________________________  ________________________________________________________________________

## 2017-11-04 ENCOUNTER — Other Ambulatory Visit: Payer: Self-pay | Admitting: Obstetrics and Gynecology

## 2017-11-04 ENCOUNTER — Encounter (HOSPITAL_COMMUNITY): Payer: Self-pay | Admitting: Emergency Medicine

## 2017-11-04 ENCOUNTER — Other Ambulatory Visit: Payer: Self-pay

## 2017-11-04 ENCOUNTER — Encounter (HOSPITAL_COMMUNITY)
Admission: RE | Admit: 2017-11-04 | Discharge: 2017-11-04 | Disposition: A | Discharge status: Home / Self Care | Payer: BLUE CROSS/BLUE SHIELD | Source: Ambulatory Visit | Attending: Obstetrics and Gynecology | Admitting: Obstetrics and Gynecology

## 2017-11-04 DIAGNOSIS — Z01812 Encounter for preprocedural laboratory examination: Secondary | ICD-10-CM | POA: Diagnosis present

## 2017-11-04 LAB — BASIC METABOLIC PANEL
ANION GAP: 8 (ref 5–15)
BUN: 7 mg/dL (ref 6–20)
CHLORIDE: 104 mmol/L (ref 98–111)
CO2: 30 mmol/L (ref 22–32)
Calcium: 9.2 mg/dL (ref 8.9–10.3)
Creatinine, Ser: 0.61 mg/dL (ref 0.44–1.00)
GFR calc non Af Amer: >60 mL/min (ref >60)
Glucose, Bld: 182 mg/dL — ABNORMAL HIGH (ref 70–99)
POTASSIUM: 4.1 mmol/L (ref 3.5–5.1)
SODIUM: 142 mmol/L (ref 135–145)

## 2017-11-04 LAB — CBC
HCT: 29.1 % — ABNORMAL LOW (ref 36.0–46.0)
Hemoglobin: 10.1 g/dL — ABNORMAL LOW (ref 12.0–15.0)
MCH: 32 pg (ref 26.0–34.0)
MCHC: 34.7 g/dL (ref 30.0–36.0)
MCV: 92.1 fL (ref 78.0–100.0)
PLATELETS: 121 10*3/uL — AB (ref 150–400)
RBC: 3.16 MIL/uL — AB (ref 3.87–5.11)
RDW: 19.5 % — AB (ref 11.5–15.5)
WBC: 8 10*3/uL (ref 4.0–10.5)

## 2017-11-04 NOTE — Progress Notes (Signed)
CBC ROUTED VIA Epic TO DR Ronita Hipps

## 2017-11-07 NOTE — Consult Note (Signed)
NAME: Mary Dougherty, VOLLMAN MEDICAL RECORD [id number] ACCOUNT [id number] DATE OF BIRTH:[birth year] FACILITY[facility] LOCATION: [facility] PHYSICIAN:Tenelle Andreason J. Banessa Mao, MD  HISTORY AND PHYSICAL EXAMINATION  DATE OF ADMISSION:  11/08/2017  PREOPERATIVE DIAGNOSES:  Breast cancer, estrogen and progesterone receptor-positive tumor and poorly tolerant of ovarian suppression medications for definitive therapy.  The patient also has a family history of endometrial cancer.  ALLERGIES:  She has no known drug allergies.  MEDICATIONS:  Folate and exemestane.  SOCIAL HISTORY:  She is a nonsmoker, nondrinker.  She denies domestic physical violence.  She has a surgical history remarkable for lumpectomy and Port-A-Cath placement Noncontributory pregnancy history.  FAMILY HISTORY:  Endometrial cancer, diabetes, heart disease and chronic hypertension.  PHYSICAL EXAMINATION: GENERAL:  She is a well-developed, well-nourished white female in no acute distress. HEENT:  Normal. NECK:  Supple, full range of motion. LUNGS:  Clear. HEART:  Regular rhythm. ABDOMEN:  Soft, nontender. PELVIC:  Reveals a small anteflexed uterus, no adnexal masses appreciated. EXTREMITIES:  No cords. NEUROLOGIC:  Nonfocal. SKIN:  Intact.  IMPRESSION: 1.  Estrogen and progesterone receptor-positive breast cancer. 2.  Family history of endometrial cancer. 3.  Poor tolerance to ovarian suppression medications.  PLAN:  Proceed with da Vinci total laparoscopic hysterectomy, bilateral salpingo-oophorectomy.  Risks of anesthesia, infection, bleeding, injury to surrounding organs, possible need for repair were discussed.  Delayed versus immediate complications to  bowel and bladder injury noted.  The patient acknowledges and wishes to proceed.  LN/NUANCE D:11/07/2017 T:11/07/2017 JOB:[id number]

## 2017-11-08 ENCOUNTER — Ambulatory Visit (HOSPITAL_COMMUNITY): Payer: BLUE CROSS/BLUE SHIELD | Admitting: Anesthesiology

## 2017-11-08 ENCOUNTER — Encounter (HOSPITAL_COMMUNITY): Payer: Self-pay | Admitting: *Deleted

## 2017-11-08 ENCOUNTER — Encounter (HOSPITAL_COMMUNITY)
Admission: RE | Disposition: A | Discharge status: Home / Self Care | Payer: Self-pay | Source: Ambulatory Visit | Attending: Obstetrics and Gynecology

## 2017-11-08 ENCOUNTER — Ambulatory Visit (HOSPITAL_COMMUNITY)
Admission: RE | Admit: 2017-11-08 | Discharge: 2017-11-09 | Disposition: A | Discharge status: Home / Self Care | Payer: BLUE CROSS/BLUE SHIELD | Source: Ambulatory Visit | Attending: Obstetrics and Gynecology | Admitting: Obstetrics and Gynecology

## 2017-11-08 DIAGNOSIS — R7303 Prediabetes: Secondary | ICD-10-CM | POA: Insufficient documentation

## 2017-11-08 DIAGNOSIS — Z9221 Personal history of antineoplastic chemotherapy: Secondary | ICD-10-CM | POA: Insufficient documentation

## 2017-11-08 DIAGNOSIS — Z8049 Family history of malignant neoplasm of other genital organs: Secondary | ICD-10-CM | POA: Diagnosis not present

## 2017-11-08 DIAGNOSIS — Z8349 Family history of other endocrine, nutritional and metabolic diseases: Secondary | ICD-10-CM | POA: Insufficient documentation

## 2017-11-08 DIAGNOSIS — K660 Peritoneal adhesions (postprocedural) (postinfection): Secondary | ICD-10-CM | POA: Insufficient documentation

## 2017-11-08 DIAGNOSIS — Z923 Personal history of irradiation: Secondary | ICD-10-CM | POA: Insufficient documentation

## 2017-11-08 DIAGNOSIS — Z888 Allergy status to other drugs, medicaments and biological substances status: Secondary | ICD-10-CM | POA: Diagnosis not present

## 2017-11-08 DIAGNOSIS — N83201 Unspecified ovarian cyst, right side: Secondary | ICD-10-CM | POA: Insufficient documentation

## 2017-11-08 DIAGNOSIS — Z833 Family history of diabetes mellitus: Secondary | ICD-10-CM | POA: Insufficient documentation

## 2017-11-08 DIAGNOSIS — D580 Hereditary spherocytosis: Secondary | ICD-10-CM | POA: Insufficient documentation

## 2017-11-08 DIAGNOSIS — N83202 Unspecified ovarian cyst, left side: Secondary | ICD-10-CM | POA: Diagnosis not present

## 2017-11-08 DIAGNOSIS — Z170 Estrogen receptor positive status [ER+]: Secondary | ICD-10-CM | POA: Diagnosis not present

## 2017-11-08 DIAGNOSIS — Z8249 Family history of ischemic heart disease and other diseases of the circulatory system: Secondary | ICD-10-CM | POA: Diagnosis not present

## 2017-11-08 DIAGNOSIS — E079 Disorder of thyroid, unspecified: Secondary | ICD-10-CM | POA: Diagnosis not present

## 2017-11-08 DIAGNOSIS — Z9011 Acquired absence of right breast and nipple: Secondary | ICD-10-CM | POA: Insufficient documentation

## 2017-11-08 DIAGNOSIS — N838 Other noninflammatory disorders of ovary, fallopian tube and broad ligament: Secondary | ICD-10-CM | POA: Diagnosis not present

## 2017-11-08 DIAGNOSIS — D649 Anemia, unspecified: Secondary | ICD-10-CM | POA: Insufficient documentation

## 2017-11-08 DIAGNOSIS — Z79899 Other long term (current) drug therapy: Secondary | ICD-10-CM | POA: Diagnosis not present

## 2017-11-08 DIAGNOSIS — C50919 Malignant neoplasm of unspecified site of unspecified female breast: Secondary | ICD-10-CM | POA: Diagnosis present

## 2017-11-08 DIAGNOSIS — Z853 Personal history of malignant neoplasm of breast: Secondary | ICD-10-CM | POA: Diagnosis not present

## 2017-11-08 DIAGNOSIS — Z808 Family history of malignant neoplasm of other organs or systems: Secondary | ICD-10-CM | POA: Diagnosis not present

## 2017-11-08 HISTORY — PX: ROBOTIC ASSISTED LAPAROSCOPIC LYSIS OF ADHESION: SHX6080

## 2017-11-08 HISTORY — PX: PORT-A-CATH REMOVAL: SHX5289

## 2017-11-08 HISTORY — PX: ROBOTIC ASSISTED TOTAL HYSTERECTOMY WITH BILATERAL SALPINGO OOPHERECTOMY: SHX6086

## 2017-11-08 LAB — CBC
HCT: 29.2 % — ABNORMAL LOW (ref 36.0–46.0)
Hemoglobin: 10 g/dL — ABNORMAL LOW (ref 12.0–15.0)
MCH: 32.3 pg (ref 26.0–34.0)
MCHC: 34.2 g/dL (ref 30.0–36.0)
MCV: 94.2 fL (ref 78.0–100.0)
Platelets: 120 K/uL — ABNORMAL LOW (ref 150–400)
RBC: 3.1 MIL/uL — ABNORMAL LOW (ref 3.87–5.11)
RDW: 19.7 % — ABNORMAL HIGH (ref 11.5–15.5)
WBC: 8 K/uL (ref 4.0–10.5)

## 2017-11-08 LAB — BASIC METABOLIC PANEL
ANION GAP: 12 (ref 5–15)
BUN: 6 mg/dL (ref 6–20)
CHLORIDE: 102 mmol/L (ref 98–111)
CO2: 26 mmol/L (ref 22–32)
Calcium: 8.8 mg/dL — ABNORMAL LOW (ref 8.9–10.3)
Creatinine, Ser: 0.54 mg/dL (ref 0.44–1.00)
GFR calc non Af Amer: >60 mL/min (ref >60)
Glucose, Bld: 159 mg/dL — ABNORMAL HIGH (ref 70–99)
Potassium: 3.8 mmol/L (ref 3.5–5.1)
Sodium: 140 mmol/L (ref 135–145)

## 2017-11-08 SURGERY — HYSTERECTOMY, TOTAL, ROBOT-ASSISTED, LAPAROSCOPIC, WITH BILATERAL SALPINGO-OOPHORECTOMY
Anesthesia: General

## 2017-11-08 MED ORDER — FENTANYL CITRATE (PF) 250 MCG/5ML IJ SOLN
INTRAMUSCULAR | Status: AC
  Filled 2017-11-08: qty 5

## 2017-11-08 MED ORDER — NALOXONE HCL 0.4 MG/ML IJ SOLN: 0.4000 mg | INTRAMUSCULAR | Status: DC | PRN

## 2017-11-08 MED ORDER — LIDOCAINE HCL (CARDIAC) PF 100 MG/5ML IV SOSY
PREFILLED_SYRINGE | INTRAVENOUS | Status: AC
  Filled 2017-11-08: qty 5

## 2017-11-08 MED ORDER — DIPHENHYDRAMINE HCL 12.5 MG/5ML PO ELIX: 12.5000 mg | ORAL_SOLUTION | Freq: Four times a day (QID) | ORAL | Status: DC | PRN

## 2017-11-08 MED ORDER — GLYCOPYRROLATE PF 0.2 MG/ML IJ SOSY
PREFILLED_SYRINGE | INTRAMUSCULAR | Status: AC
  Filled 2017-11-08: qty 1

## 2017-11-08 MED ORDER — PROPOFOL 10 MG/ML IV BOLUS
INTRAVENOUS | Status: DC | PRN
  Administered 2017-11-08: 150 mg via INTRAVENOUS

## 2017-11-08 MED ORDER — ONDANSETRON HCL 4 MG/2ML IJ SOLN
INTRAMUSCULAR | Status: DC | PRN
  Administered 2017-11-08: 4 mg via INTRAVENOUS

## 2017-11-08 MED ORDER — BUPIVACAINE HCL (PF) 0.25 % IJ SOLN
INTRAMUSCULAR | Status: DC | PRN
  Administered 2017-11-08: 20 mL

## 2017-11-08 MED ORDER — FENTANYL CITRATE (PF) 100 MCG/2ML IJ SOLN: 25.0000 ug | INTRAMUSCULAR | Status: DC | PRN

## 2017-11-08 MED ORDER — LACTATED RINGERS IV SOLN: INTRAVENOUS | Status: DC

## 2017-11-08 MED ORDER — TRAMADOL HCL 50 MG PO TABS
50.0000 mg | ORAL_TABLET | Freq: Four times a day (QID) | ORAL | Status: DC | PRN
  Administered 2017-11-09: 50 mg via ORAL

## 2017-11-08 MED ORDER — KETOROLAC TROMETHAMINE 30 MG/ML IJ SOLN
INTRAMUSCULAR | Status: AC
  Filled 2017-11-08: qty 1

## 2017-11-08 MED ORDER — SUGAMMADEX SODIUM 200 MG/2ML IV SOLN
INTRAVENOUS | Status: DC | PRN
  Administered 2017-11-08: 200 mg via INTRAVENOUS

## 2017-11-08 MED ORDER — ROCURONIUM BROMIDE 100 MG/10ML IV SOLN
INTRAVENOUS | Status: AC
  Filled 2017-11-08: qty 1

## 2017-11-08 MED ORDER — BUPIVACAINE-EPINEPHRINE 0.5% -1:200000 IJ SOLN
INTRAMUSCULAR | Status: DC | PRN
  Administered 2017-11-08: 10 mL

## 2017-11-08 MED ORDER — FOLIC ACID 800 MCG PO TABS: 800.0000 ug | ORAL_TABLET | Freq: Every day | ORAL | Status: DC

## 2017-11-08 MED ORDER — ONDANSETRON HCL 4 MG/2ML IJ SOLN: 4.0000 mg | Freq: Four times a day (QID) | INTRAMUSCULAR | Status: DC | PRN

## 2017-11-08 MED ORDER — MIDAZOLAM HCL 2 MG/2ML IJ SOLN
INTRAMUSCULAR | Status: AC
  Filled 2017-11-08: qty 2

## 2017-11-08 MED ORDER — EXEMESTANE 25 MG PO TABS: 25.0000 mg | ORAL_TABLET | Freq: Every day | ORAL | Status: DC

## 2017-11-08 MED ORDER — HYDROMORPHONE 1 MG/ML IV SOLN
INTRAVENOUS | Status: DC
  Administered 2017-11-08: 12:00:00 via INTRAVENOUS
  Administered 2017-11-08: 0.4 mg via INTRAVENOUS
  Filled 2017-11-08: qty 25

## 2017-11-08 MED ORDER — BUPIVACAINE-EPINEPHRINE (PF) 0.5% -1:200000 IJ SOLN
INTRAMUSCULAR | Status: AC
  Filled 2017-11-08: qty 30

## 2017-11-08 MED ORDER — EPHEDRINE 5 MG/ML INJ
INTRAVENOUS | Status: AC
  Filled 2017-11-08: qty 10

## 2017-11-08 MED ORDER — BUPIVACAINE HCL (PF) 0.25 % IJ SOLN
INTRAMUSCULAR | Status: AC
  Filled 2017-11-08: qty 30

## 2017-11-08 MED ORDER — LIDOCAINE HCL (CARDIAC) PF 100 MG/5ML IV SOSY
PREFILLED_SYRINGE | INTRAVENOUS | Status: DC | PRN
  Administered 2017-11-08: 80 mg via INTRAVENOUS

## 2017-11-08 MED ORDER — GLYCOPYRROLATE 0.2 MG/ML IJ SOLN
INTRAMUSCULAR | Status: DC | PRN
  Administered 2017-11-08: 0.2 mg via INTRAVENOUS

## 2017-11-08 MED ORDER — CEFAZOLIN SODIUM-DEXTROSE 2-4 GM/100ML-% IV SOLN
2.0000 g | INTRAVENOUS | Status: AC
  Administered 2017-11-08: 2 g via INTRAVENOUS
  Filled 2017-11-08: qty 100

## 2017-11-08 MED ORDER — ROCURONIUM BROMIDE 100 MG/10ML IV SOLN
INTRAVENOUS | Status: DC | PRN
  Administered 2017-11-08: 50 mg via INTRAVENOUS
  Administered 2017-11-08: 20 mg via INTRAVENOUS

## 2017-11-08 MED ORDER — MIDAZOLAM HCL 5 MG/5ML IJ SOLN
INTRAMUSCULAR | Status: DC | PRN
  Administered 2017-11-08: 2 mg via INTRAVENOUS

## 2017-11-08 MED ORDER — ONDANSETRON HCL 4 MG/2ML IJ SOLN
INTRAMUSCULAR | Status: AC
  Filled 2017-11-08: qty 2

## 2017-11-08 MED ORDER — OXYCODONE-ACETAMINOPHEN 5-325 MG PO TABS: 1.0000 | ORAL_TABLET | ORAL | Status: DC | PRN

## 2017-11-08 MED ORDER — SODIUM CHLORIDE 0.9 % IJ SOLN
INTRAMUSCULAR | Status: AC
  Filled 2017-11-08: qty 50

## 2017-11-08 MED ORDER — DEXAMETHASONE SODIUM PHOSPHATE 10 MG/ML IJ SOLN
INTRAMUSCULAR | Status: DC | PRN
  Administered 2017-11-08: 8 mg via INTRAVENOUS

## 2017-11-08 MED ORDER — LACTATED RINGERS IV SOLN
INTRAVENOUS | Status: DC
  Administered 2017-11-08 (×2): via INTRAVENOUS

## 2017-11-08 MED ORDER — OXYCODONE HCL 5 MG/5ML PO SOLN
5.0000 mg | Freq: Once | ORAL | Status: DC | PRN
  Filled 2017-11-08: qty 5

## 2017-11-08 MED ORDER — DIPHENHYDRAMINE HCL 50 MG/ML IJ SOLN: 12.5000 mg | Freq: Four times a day (QID) | INTRAMUSCULAR | Status: DC | PRN

## 2017-11-08 MED ORDER — FENTANYL CITRATE (PF) 100 MCG/2ML IJ SOLN
INTRAMUSCULAR | Status: DC | PRN
  Administered 2017-11-08: 100 ug via INTRAVENOUS
  Administered 2017-11-08: 50 ug via INTRAVENOUS
  Administered 2017-11-08: 100 ug via INTRAVENOUS

## 2017-11-08 MED ORDER — SODIUM CHLORIDE 0.9% FLUSH: 9.0000 mL | INTRAVENOUS | Status: DC | PRN

## 2017-11-08 MED ORDER — OXYCODONE HCL 5 MG PO TABS: 5.0000 mg | ORAL_TABLET | Freq: Once | ORAL | Status: DC | PRN

## 2017-11-08 MED ORDER — SODIUM CHLORIDE 0.9 % IJ SOLN
INTRAMUSCULAR | Status: DC | PRN
  Administered 2017-11-08: 10 mL

## 2017-11-08 MED ORDER — DEXAMETHASONE SODIUM PHOSPHATE 10 MG/ML IJ SOLN
INTRAMUSCULAR | Status: AC
  Filled 2017-11-08: qty 1

## 2017-11-08 MED ORDER — SODIUM CHLORIDE 0.9 % IV SOLN
INTRAVENOUS | Status: DC | PRN
  Administered 2017-11-08: 60 mL

## 2017-11-08 MED ORDER — EPHEDRINE SULFATE 50 MG/ML IJ SOLN
INTRAMUSCULAR | Status: DC | PRN
  Administered 2017-11-08 (×3): 10 mg via INTRAVENOUS

## 2017-11-08 MED ORDER — KETOROLAC TROMETHAMINE 30 MG/ML IJ SOLN
INTRAMUSCULAR | Status: DC | PRN
  Administered 2017-11-08: 30 mg via INTRAVENOUS

## 2017-11-08 MED ORDER — PROPOFOL 10 MG/ML IV BOLUS
INTRAVENOUS | Status: AC
  Filled 2017-11-08: qty 20

## 2017-11-08 MED ORDER — ROPIVACAINE HCL 5 MG/ML IJ SOLN
INTRAMUSCULAR | Status: AC
  Filled 2017-11-08: qty 30

## 2017-11-08 SURGICAL SUPPLY — 81 items
ADH SKN CLS APL DERMABOND .7 (GAUZE/BANDAGES/DRESSINGS) ×3
APL SKNCLS STERI-STRIP NONHPOA (GAUZE/BANDAGES/DRESSINGS) ×3
BENZOIN TINCTURE PRP APPL 2/3 (GAUZE/BANDAGES/DRESSINGS) ×1 IMPLANT
BLADE 15 SAFETY STRL DISP (BLADE) ×1 IMPLANT
BLADE HEX COATED 2.75 (ELECTRODE) ×4 IMPLANT
BLADE SURG 15 STRL LF DISP TIS (BLADE) ×3 IMPLANT
BLADE SURG 15 STRL SS (BLADE) ×4
CANISTER SUCT 3000ML PPV (MISCELLANEOUS) ×4 IMPLANT
CATH FOLEY 3WAY  5CC 16FR (CATHETERS) ×1
CATH FOLEY 3WAY 5CC 16FR (CATHETERS) ×3 IMPLANT
CHLORAPREP W/TINT 26ML (MISCELLANEOUS) ×1 IMPLANT
COVER BACK TABLE 60X90IN (DRAPES) ×4 IMPLANT
COVER SURGICAL LIGHT HANDLE (MISCELLANEOUS) ×4 IMPLANT
COVER TIP SHEARS 8 DVNC (MISCELLANEOUS) ×3 IMPLANT
COVER TIP SHEARS 8MM DA VINCI (MISCELLANEOUS) ×1
DECANTER SPIKE VIAL GLASS SM (MISCELLANEOUS) ×12 IMPLANT
DEFOGGER SCOPE WARMER CLEARIFY (MISCELLANEOUS) ×4 IMPLANT
DERMABOND ADVANCED (GAUZE/BANDAGES/DRESSINGS) ×1
DERMABOND ADVANCED .7 DNX12 (GAUZE/BANDAGES/DRESSINGS) ×6 IMPLANT
DRAPE ARM DVNC X/XI (DISPOSABLE) ×12 IMPLANT
DRAPE COLUMN DVNC XI (DISPOSABLE) ×3 IMPLANT
DRAPE DA VINCI XI ARM (DISPOSABLE) ×4
DRAPE DA VINCI XI COLUMN (DISPOSABLE) ×1
DRAPE LAPAROTOMY T 98X78 PEDS (DRAPES) ×1 IMPLANT
DRAPE LAPAROTOMY TRNSV 102X78 (DRAPE) ×4 IMPLANT
DRSG TEGADERM 2-3/8X2-3/4 SM (GAUZE/BANDAGES/DRESSINGS) ×2 IMPLANT
DURAPREP 26ML APPLICATOR (WOUND CARE) ×4 IMPLANT
ELECT PENCIL ROCKER SW 15FT (MISCELLANEOUS) ×4 IMPLANT
ELECT REM PT RETURN 15FT ADLT (MISCELLANEOUS) ×8 IMPLANT
GAUZE SPONGE 2X2 8PLY STRL LF (GAUZE/BANDAGES/DRESSINGS) ×2 IMPLANT
GAUZE SPONGE 4X4 12PLY STRL (GAUZE/BANDAGES/DRESSINGS) ×2 IMPLANT
GAUZE SPONGE 4X4 16PLY XRAY LF (GAUZE/BANDAGES/DRESSINGS) ×1 IMPLANT
GLOVE BIO SURGEON STRL SZ7.5 (GLOVE) ×16 IMPLANT
GLOVE BIOGEL M STRL SZ7.5 (GLOVE) ×2 IMPLANT
GLOVE BIOGEL PI IND STRL 7.0 (GLOVE) ×9 IMPLANT
GLOVE BIOGEL PI INDICATOR 7.0 (GLOVE) ×3
GLOVE INDICATOR 8.0 STRL GRN (GLOVE) ×4 IMPLANT
GOWN STRL REUS W/ TWL LRG LVL3 (GOWN DISPOSABLE) ×1 IMPLANT
GOWN STRL REUS W/TWL LRG LVL3 (GOWN DISPOSABLE) ×4
GOWN STRL REUS W/TWL XL LVL3 (GOWN DISPOSABLE) ×4 IMPLANT
IRRIG SUCT STRYKERFLOW 2 WTIP (MISCELLANEOUS) ×4
IRRIGATION SUCT STRKRFLW 2 WTP (MISCELLANEOUS) ×3 IMPLANT
KIT BASIN OR (CUSTOM PROCEDURE TRAY) ×4 IMPLANT
NDL HYPO 25X1 1.5 SAFETY (NEEDLE) ×2 IMPLANT
NEEDLE HYPO 25X1 1.5 SAFETY (NEEDLE) ×8 IMPLANT
NEEDLE INSUFFLATION 150MM (ENDOMECHANICALS) ×4 IMPLANT
OBTURATOR OPTICAL STANDARD 8MM (TROCAR) ×1
OBTURATOR OPTICAL STND 8 DVNC (TROCAR) ×3
OBTURATOR OPTICALSTD 8 DVNC (TROCAR) IMPLANT
OCCLUDER COLPOPNEUMO (BALLOONS) ×4 IMPLANT
PACK BASIC VI WITH GOWN DISP (CUSTOM PROCEDURE TRAY) ×4 IMPLANT
PACK ROBOT WH (CUSTOM PROCEDURE TRAY) ×4 IMPLANT
PACK ROBOTIC GOWN (GOWN DISPOSABLE) ×4 IMPLANT
PACK TRENDGUARD 450 HYBRID PRO (MISCELLANEOUS) IMPLANT
PAD PREP 24X48 CUFFED NSTRL (MISCELLANEOUS) ×4 IMPLANT
POSITIONER SURGICAL ARM (MISCELLANEOUS) ×8 IMPLANT
SEAL CANN UNIV 5-8 DVNC XI (MISCELLANEOUS) ×9 IMPLANT
SEAL XI 5MM-8MM UNIVERSAL (MISCELLANEOUS) ×3
SET TRI-LUMEN FLTR TB AIRSEAL (TUBING) ×4 IMPLANT
SPONGE GAUZE 2X2 STER 10/PKG (GAUZE/BANDAGES/DRESSINGS) ×1
SPONGE LAP 4X18 RFD (DISPOSABLE) ×4 IMPLANT
STRIP CLOSURE SKIN 1/2X4 (GAUZE/BANDAGES/DRESSINGS) ×1 IMPLANT
SUT MNCRL AB 4-0 PS2 18 (SUTURE) ×10 IMPLANT
SUT VIC AB 0 CT1 27 (SUTURE) ×8
SUT VIC AB 0 CT1 27XBRD ANBCTR (SUTURE) ×6 IMPLANT
SUT VIC AB 3-0 SH 27 (SUTURE) ×4
SUT VIC AB 3-0 SH 27XBRD (SUTURE) ×2 IMPLANT
SUT VICRYL 0 UR6 27IN ABS (SUTURE) ×4 IMPLANT
SUT VICRYL RAPIDE 4/0 PS 2 (SUTURE) ×8 IMPLANT
SUT VLOC 180 0 9IN  GS21 (SUTURE) ×1
SUT VLOC 180 0 9IN GS21 (SUTURE) ×2 IMPLANT
SYR BULB IRRIGATION 50ML (SYRINGE) ×4 IMPLANT
SYR CONTROL 10ML LL (SYRINGE) ×4 IMPLANT
TIP UTERINE 5.1X6CM LAV DISP (MISCELLANEOUS) ×2 IMPLANT
TIP UTERINE 6.7X6CM WHT DISP (MISCELLANEOUS) ×1 IMPLANT
TOWEL OR 17X26 10 PK STRL BLUE (TOWEL DISPOSABLE) ×12 IMPLANT
TOWEL OR NON WOVEN STRL DISP B (DISPOSABLE) ×4 IMPLANT
TRENDGUARD 450 HYBRID PRO PACK (MISCELLANEOUS) ×4
TROCAR PORT AIRSEAL 8X120 (TROCAR) ×4 IMPLANT
WATER STERILE IRR 1000ML POUR (IV SOLUTION) ×5 IMPLANT
YANKAUER SUCT BULB TIP 10FT TU (MISCELLANEOUS) IMPLANT

## 2017-11-08 NOTE — Op Note (Signed)
11/08/2017  10:39 AM  PATIENT:  Mary Dougherty  47 y.o. female  PRE-OPERATIVE DIAGNOSIS:  H/o recurrent right breast cancer; undesired port a cath  POST-OPERATIVE DIAGNOSIS:  H/o recurrent right breast cancer; undesired port a cath  PROCEDURE:  Procedure(s): REMOVAL PORT-A-CATH  SURGEON:  Surgeon(s): Greer Pickerel, MD  ASSISTANTS: none   ANESTHESIA:   general  DRAINS: Urinary Catheter (Foley)   LOCAL MEDICATIONS USED:  MARCAINE     SPECIMEN:  No Specimen  DISPOSITION OF SPECIMEN:  N/A  COUNTS:  YES  INDICATION FOR PROCEDURE: 47 year old female with history of recurrent right breast cancer who was completed neoadjuvant chemotherapy and no longer needs her Port-A-Cath.  Her private surgeon asked me to remove the Port-A-Cath while she was under anesthesia while getting her hysterectomy.  I met the patient preoperatively and discussed the risk and benefits of the procedure.  She was familiar with the nature of the procedure as she had had a Port-A-Cath a few years ago  PROCEDURE: Please see Dr. Modesto Charon operative note for additional details.  At the conclusion of his procedure the patient was placed in supine position.  The right chest was prepped and draped in the usual standard surgical fashion with ChloraPrep.  She had received IV antibiotics for her initial procedure.  A surgical timeout was performed for this procedure.  Her old right upper chest incision was elliptically excised with a 15 blade.  The Port-A-Cath was encountered.  The sutures anchoring the Port-A-Cath were cut and removed.  The Port-A-Cath was delivered from the chest wall.  A 3-0 Vicryl suture was placed around the port tubing were tunneled underneath the skin and tied down as the Port-A-Cath was removed.  The entire port was removed in its entirety.  Additional local was infiltrated.  The deep dermis was reapproximated with a running 3-0 Vicryl suture.  Skin was closed with a 4 Monocryl in a subcuticular fashion  followed by application of benzoin, Steri-Strips 2 x 2 and a Tegaderm.  All needle instrument sponge counts were correct x2.  There were no immediate complications.  The patient was extubated and taken to recovery room in stable condition  PLAN OF CARE: Admit for overnight observation  PATIENT DISPOSITION:  PACU - hemodynamically stable.   Delay start of Pharmacological VTE agent (>24hrs) due to surgical blood loss or risk of bleeding:  no  Leighton Ruff. Redmond Pulling, MD, FACS General, Bariatric, & Minimally Invasive Surgery [facility], [place]

## 2017-11-08 NOTE — Op Note (Signed)
NAME: Mary Dougherty, Mary Dougherty MEDICAL RECORD [id number] ACCOUNT [id number] DATE OF BIRTH:Jun 13, 1970 [facility]AC[facility] LOCATION: [facility] PHYSICIAN:Jevon Littlepage J. Quinta Eimer, MD  OPERATIVE REPORT  DATE OF PROCEDURE:  11/08/2017  PREOPERATIVE DIAGNOSES:   1.  Estrogen receptor positive breast cancer. 2.  History of side effects for ovarian suppression. 3.  Family history of endometrial cancer.  POSTOPERATIVE DIAGNOSES:   1.  Estrogen receptor positive breast cancer. 2.  History of side effects tp ovarian suppression. 3.  Family history of endometrial cancer. 4.  Sigmoid adhesions densely to the left adnexa. 5.  Enterocele  PROCEDURE:  Da Vinci-assisted total laparoscopic hysterectomy, bilateral salpingo-oophorectomy, lysis of sigmoid to left adnexal adhesions, Mccall cul de plasty  SURGEON:  Brien Few, MD  ASSISTANT:  Mel Almond, FACM, CNM  ESTIMATED BLOOD LOSS:  Less than 50 mL.  COMPLICATIONS:  None.  DRAINS:  Foley.  COUNTS:  Correct.  DISPOSITION:  The patient to recovery in good condition.  BRIEF OPERATIVE NOTE:  After being apprised of the risks of anesthesia, infection, bleeding, injury to surrounding organs, possible need for repair, delayed versus immediate complications to include bowel and bladder, internal vessel injury, possible  need for repair, inability to prevent future cancer, the patient was brought to the operating room where she was administered general anesthetic without complications.  Prepped and draped in the usual sterile fashion.  Foley catheter placed.  Vaginal and  cervical stenosis were noted.  Therefore, the cervix was grasped using a single tooth tenaculum and anterior lip and posterior lip of the cervix are sutured with 0 Vicryl pop-offs and threaded through the RUMI since the balloon was unable to be  insufflated due to the small uterus.  At this time, it is tied to the [place] was placed in a standard fashion.  Attention turned to the  abdominal portion of procedure where a Veress needle was placed.  Opening pressure -1.  Four liters of CO2  insufflated without difficulty.  Trocar placed atraumatically.  Visualization revealed normal liver/gallbladder bed, normal appendiceal area, normal diaphragm.  Uterus is small.  There were significant adhesions of the sigmoid colon to the left adnexa.   Normal anterior and posterior cul-de-sac, normal tubes and ovaries were appreciated.  The robotic ports were placed, 2 on the right, 1 on the left, AirSeal port on the left, all entered under direct visualization and the robot was docked in the standard  fashion, placing a ProGrasp long bipolar forceps and endoscissors.  At this time, sigmoid adhesions were sharply lysed off the left adnexa exposing an otherwise normal left adnexa.  The ureter was seen peristalsing normally along the left lateral pelvic  sidewall.  The retroperitoneal space was entered between the ovarian ligament and round ligament and the right tube was dissected off along the mesosalpinx and removed at the uterotubal junction and removed and the same was done to the right tube.  The  infundibulopelvic ligament on the left was skeletonized, noting the ureter and the infundibulopelvic ligament is cauterized in a 2 point bipolar cautery method and cut.  The retroperitoneal space was further developed.  The round ligament, opened, the  uterine vessels skeletonized on the left.  Bladder flap developed sharply.  Uterine vessels were cauterized, but not cut on the right.  The retroperitoneal space is similarly entered and the infundibulopelvic ligament was skeletonized, cauterized, and  cut.  The round ligament is opened the uterine vessels on the right were skeletonized, cauterized, and cut.  The bladder flap was further  developed.  Urine is clear.  Specimen is detached circumferentially and retracted through the vagina.  The vaginal  incision closed using a 0 V-Loc suture in continuous  running fashion.  Second imbricating layer placed and a McCall's culdoplasty was performed without difficulty.  Good hemostasis was noted.  Reperitonealization of the vaginal cuff was done.  All  instruments were removed under direct visualization.  CO2 released.  At this time,  Ropivacaine solution has been placed.  Good hemostasis was assured.  Vaginal exam reveals the cuff to be well-approximated.  Incisions are closed using 0 Vicryl, 4-0  Vicryl, Dermabond.  The urine is clear.    The patient tolerated the procedure well, was awakened and transferred to recovery in good condition.  AN/NUANCE  D:11/08/2017 T:11/08/2017 JOB:[id number]

## 2017-11-08 NOTE — Op Note (Signed)
11/08/2017  9:59 AM  PATIENT:  Mary Dougherty  47 y.o. female  PRE-OPERATIVE DIAGNOSIS:  ER and PR Positive Breast Cancer, Pelvic Pain, Family History of Endometrial Cancer  POST-OPERATIVE DIAGNOSIS:  ER and PR Positive Breast Cancer, Pelvic Pain, Family History of Endometrial Cancer SIGMOID ADHESIONS TO LEFT ADNEXA ENTEROCELE  PROCEDURE:  Procedure(s): XI ROBOTIC ASSISTED TOTAL HYSTERECTOMY  BILATERAL SALPINGO OOPHORECTOMY MCCALL CUL DE PLASTY REMOVAL PORT-A-CATH XI ROBOTIC ASSISTED LAPAROSCOPIC LYSIS OF BOWEL ADHESIONS  SURGEON:  Surgeon(s): Brien Few, MD Greer Pickerel, MD  ASSISTANTS: Janann Colonel, CNM   ANESTHESIA:   local and general  ESTIMATED BLOOD LOSS: 50 mL   DRAINS: Urinary Catheter (Foley)   LOCAL MEDICATIONS USED:  MARCAINE    and Amount: 20 ml  SPECIMEN:  Source of Specimen:  uterus , cervix, bilateral tubes and ovaries  DISPOSITION OF SPECIMEN:  PATHOLOGY  COUNTS:  YES  DICTATION #: [id number]  PLAN OF CARE: dc in am, 23 hr obs  PATIENT DISPOSITION:  PACU - hemodynamically stable.

## 2017-11-08 NOTE — Discharge Instructions (Signed)
[facility] POST OP INSTRUCTIONS   PAIN CONTROL  1. First take acetaminophen (Tylenol) AND/or ibuprofen (Advil) to control your pain after surgery.  Follow directions on package.  Taking acetaminophen (Tylenol) and/or ibuprofen (Advil) regularly after surgery will help to control your pain and lower the amount of prescription pain medication you may need.  You should not take more than 4,000 mg (4 grams) of acetaminophen (Tylenol) in 24 hours.  You should not take ibuprofen (Advil), aleve, motrin, naprosyn or other NSAIDS if you have a history of stomach ulcers or chronic kidney disease.  2. A prescription for pain medication may be given to you upon discharge.  Take your pain medication as prescribed, if you still have uncontrolled pain after taking acetaminophen (Tylenol) or ibuprofen (Advil). 3. Use ice packs to help control pain. 4. If you need a refill on your pain medication, please contact your pharmacy.  They will contact our office to request authorization. Prescriptions will not be filled after 5pm or on week-ends.  HOME MEDICATIONS 5. Take your usually prescribed medications unless otherwise directed.  DIET 6. You should follow a light diet the first few days after arrival home.  Be sure to include lots of fluids daily. Avoid fatty, fried foods.   CONSTIPATION 7. It is common to experience some constipation after surgery and if you are taking pain medication.  Increasing fluid intake and taking a stool softener (such as Colace) will usually help or prevent this problem from occurring.  A mild laxative (Milk of Magnesia or Miralax) should be taken according to package instructions if there are no bowel movements after 48 hours.  WOUND/INCISION CARE 8. Most patients will experience some swelling and bruising in the area of the incisions.  Ice packs will help.  Swelling and bruising can take several days to resolve.  9. Unless discharge instructions  indicate otherwise, follow guidelines below  a. STERI-STRIPS - you may remove your outer bandages 48 hours after surgery, and you may shower at that time.  You have steri-strips (small skin tapes) in place directly over the incision.  These strips should be left on the skin for 7-10 days.   b. DERMABOND/SKIN GLUE - you may shower in 24 hours.  The glue will flake off over the next 2-3 weeks. 10. Any sutures or staples will be removed at the office during your follow-up visit.  ACTIVITIES 11. You may resume regular (light) daily activities beginning the next day--such as daily self-care, walking, climbing stairs--gradually increasing activities as tolerated.  You may have sexual intercourse when it is comfortable.  Refrain from any heavy lifting or straining until approved by your doctor. a. You may drive when you are no longer taking prescription pain medication, you can comfortably wear a seatbelt, and you can safely maneuver your car and apply brakes.  FOLLOW-UP 12. Just keep previous appt with Dr Donne Hazel  OTHER INSTRUCTIONS 13.   WHEN TO CALL YOUR DOCTOR: 1. Fever over 101.0 2. Inability to urinate 3. Continued bleeding from incision. 4. Increased pain, redness, or drainage from the incision. 5. Increasing abdominal pain  The clinic staff is available to answer your questions during regular business hours.  Please dont hesitate to call and ask to speak to one of the nurses for clinical concerns.  If you have a medical emergency, go to the nearest emergency room or call 911.  A surgeon from [facility] is always on call at the hospital.  [address] [place], [place], [place]  [address] ? [address] [place] [address], [place], [place]   [id number] [phone] ? [phone] ? FAX [phone] Web site: [email or web address]

## 2017-11-08 NOTE — Progress Notes (Signed)
Patient seen and examined. Consent witnessed and signed. No changes noted. Update completed. BP 105/76   Pulse 81   Temp 98.5 F (36.9 C) (Oral)   Resp 14   Ht 5\' 5"  (1.651 m)   Wt 87.5 kg   LMP 10/28/2012   SpO2 100%   BMI 32.12 kg/m   CBC    Component Value Date/Time   WBC 8.0 11/08/2017 0631   RBC 3.10 (L) 11/08/2017 0631   HGB 10.0 (L) 11/08/2017 0631   HGB 10.6 (L) 09/24/2017 1039   HGB 11.0 (L) 03/08/2015 1544   HCT 29.2 (L) 11/08/2017 0631   HCT 31.6 (L) 03/08/2015 1544   PLT 120 (L) 11/08/2017 0631   PLT 109 (L) 09/24/2017 1039   PLT 220 03/08/2015 1544   MCV 94.2 11/08/2017 0631   MCV 80.7 05/01/2017 0907   MCV 83.4 03/08/2015 1544   MCH 32.3 11/08/2017 0631   MCHC 34.2 11/08/2017 0631   RDW 19.7 (H) 11/08/2017 0631   RDW 18.7 (H) 03/08/2015 1544   LYMPHSABS 2.0 09/24/2017 1039   LYMPHSABS 2.2 03/08/2015 1544   MONOABS 0.4 09/24/2017 1039   MONOABS 0.6 03/08/2015 1544   EOSABS 0.1 09/24/2017 1039   EOSABS 0.2 03/08/2015 1544   BASOSABS 0.0 09/24/2017 1039   BASOSABS 0.1 03/08/2015 1544

## 2017-11-08 NOTE — Anesthesia Procedure Notes (Signed)
Procedure Name: Intubation Date/Time: 11/08/2017 8:10 AM Performed by: Jonna Munro, CRNA Pre-anesthesia Checklist: Patient identified, Emergency Drugs available, Suction available, Patient being monitored and Timeout performed Patient Re-evaluated:Patient Re-evaluated prior to induction Oxygen Delivery Method: Circle system utilized Preoxygenation: Pre-oxygenation with 100% oxygen Induction Type: IV induction Ventilation: Mask ventilation without difficulty Laryngoscope Size: Mac and 3 Grade View: Grade I Tube type: Oral Tube size: 7.0 mm Number of attempts: 1 Airway Equipment and Method: Stylet Secured at: 22 cm Tube secured with: Tape Dental Injury: Teeth and Oropharynx as per pre-operative assessment

## 2017-11-08 NOTE — H&P (Signed)
Mary Dougherty is an 47 y.o. female.   Chief Complaint: here for surgery HPI: 48 yo female with h/o recurrent right breast cancer s/p right mastectomy with SLN 9/19 by Dr Donne Hazel coming in today for hysterectomy/oophorectomy with Dr Ronita Hipps. I was asked to remove port a cath from right chest.  She denies cough/sob/cp/bleeding issues. No infection.   Past Medical History:  Diagnosis Date  . Anemia   . Blood transfusion without reported diagnosis   . Chronic anemia 03/03/13  . Dyspnea    with exertion, during chemo  . Family history of adverse reaction to anesthesia     father- hard to awaken  . Hereditary spherocytosis ([place])   . Invasive ductal carcinoma of right breast ([place]) 09/2012   ER(99%), PR(100%), Ki-67(70%), 1/5 nodes positive  . Pre-diabetes    off metformin now  . Recurrent breast cancer, right ([place]) 09/2017  . S/P radiation therapy 05/08/2013-06/22/2013   1) Right breast / 50.4 Gy in 28 fractions / 2) Right Supraclavicular fossa/ 50.4 Gy in 28 fractions /3) Right Posterior Axillary boost / 11.9 Gy in 28 fractions /4) Right breast boost / 10 Gy in 5 fractions   . Spherocytosis ([place]) 1989  . Status post chemotherapy 10/07/12 - 12/16/12    Neoadjuvant chemotherapy with dose dense FEC (5-FU/epirubicin/Cytoxan) from 10/07/2012 through 12/16/12.    Marland Kitchen Syncope and collapse 1989   "thats when I found out I have Spherocytosis"  . Thyroid disease   . Use of exemestane (Aromasin) 07/25/13  . Use of goserelin acetate (Zoladex) 07/25/13  . Wears glasses     Past Surgical History:  Procedure Laterality Date  . BREAST LUMPECTOMY WITH SENTINEL LYMPH NODE BIOPSY Right 04/06/2013   [name]  . BREAST SURGERY     left breast lumpectomy/left ax snbx  . COLONOSCOPY  2015  . MASTECTOMY W/ SENTINEL NODE BIOPSY Right 10/05/2017   Procedure: RIGHT MASTECTOMY WITH RIGHT SENTINEL LYMPH NODE BIOPSY;  Surgeon: [name], MD;  Location: [facility];  Service: General;   Laterality: Right;  . PORT-A-CATH REMOVAL N/A 04/06/2013   Procedure: REMOVAL PORT-A-CATH;  Surgeon: [name], MD;  Location: [facility];  Service: General;  Laterality: N/A;  . PORTACATH PLACEMENT Left 09/20/2012   Procedure: INSERTION PORT-A-CATH;  Surgeon: [name], MD;  Location: [facility];  Service: General;  Laterality: Left;  . PORTACATH PLACEMENT Right 05/18/2017   Procedure: INSERTION PORT-A-CATH WITH ULTRASOUND;  Surgeon: [name], MD;  Location: [facility];  Service: General;  Laterality: Right;  . WISDOM TOOTH EXTRACTION      Family History  Problem Relation Age of Onset  . Pulmonary embolism Maternal Uncle 44       died instantly  . Melanoma Cousin        paternal cousin dx in her 43s  . Heart disease Father   . Diabetes Father   . Thyroid disease Mother    Social History:  reports that she has never smoked. She has never used smokeless tobacco. She reports that she does not drink alcohol or use drugs.  Allergies: No Known Allergies  Medications Prior to Admission  Medication Sig Dispense Refill  . exemestane (AROMASIN) 25 MG tablet Take 1 tablet (25 mg total) by mouth daily after breakfast. (Patient taking differently: Take 25 mg by mouth daily with lunch. )    . folic acid (FOLVITE) 761 MCG tablet Take 800 mcg by mouth daily at 12 noon.  Results for orders placed or performed during the hospital encounter of 11/08/17 (from the past 48 hour(s))  Basic metabolic panel     Status: Abnormal   Collection Time: 11/08/17  6:31 AM  Result Value Ref Range   Sodium 140 135 - 145 mmol/L   Potassium 3.8 3.5 - 5.1 mmol/L   Chloride 102 98 - 111 mmol/L   CO2 26 22 - 32 mmol/L   Glucose, Bld 159 (H) 70 - 99 mg/dL   BUN 6 6 - 20 mg/dL   Creatinine, Ser 0.54 0.44 - 1.00 mg/dL   Calcium 8.8 (L) 8.9 - 10.3 mg/dL   GFR calc non Af Amer >60 >60 mL/min   GFR calc Af Amer >60 >60 mL/min    Comment: (NOTE) The eGFR has been  calculated using the CKD EPI equation. This calculation has not been validated in all clinical situations. eGFR's persistently <60 mL/min signify possible Chronic Kidney Disease.    Anion gap 12 5 - 15    Comment: Performed at [facility], [place] [address] [place], [place] [address]  CBC     Status: Abnormal   Collection Time: 11/08/17  6:31 AM  Result Value Ref Range   WBC 8.0 4.0 - 10.5 K/uL   RBC 3.10 (L) 3.87 - 5.11 MIL/uL   Hemoglobin 10.0 (L) 12.0 - 15.0 g/dL   HCT 29.2 (L) 36.0 - 46.0 %   MCV 94.2 78.0 - 100.0 fL   MCH 32.3 26.0 - 34.0 pg   MCHC 34.2 30.0 - 36.0 g/dL   RDW 19.7 (H) 11.5 - 15.5 %   Platelets 120 (L) 150 - 400 K/uL    Comment: Performed at [facility], [place] [address], [place], [place] [address]   No results found.  Review of Systems  All other systems reviewed and are negative.   Blood pressure 105/76, pulse 81, temperature 98.5 F (36.9 C), temperature source Oral, resp. rate 14, height '5\' 5"'$  (1.651 m), weight 87.5 kg, last menstrual period 10/28/2012, SpO2 100 %. Physical Exam  Vitals reviewed. Constitutional: She is oriented to person, place, and time. She appears well-developed and well-nourished. No distress.  HENT:  Head: Normocephalic and atraumatic.  Right Ear: External ear normal.  Left Ear: External ear normal.  Eyes: Conjunctivae are normal. No scleral icterus.  Neck: Normal range of motion. Neck supple. No tracheal deviation present. No thyromegaly present.  Cardiovascular: Normal rate and normal heart sounds.  Respiratory: Effort normal and breath sounds normal. No stridor. No respiratory distress. She has no wheezes.  Old scars; port Rt chest tunneled in Rt IJ; prior breast cancer surgery  GI: Soft. There is no tenderness. There is no rebound.  Musculoskeletal: She exhibits no edema or tenderness.  Lymphadenopathy:    She has no cervical adenopathy.  Neurological: She is alert and oriented to  person, place, and time. She exhibits normal muscle tone.  Skin: Skin is warm and dry. No rash noted. She is not diaphoretic. No erythema. No pallor.  Psychiatric: She has a normal mood and affect. Her behavior is normal. Judgment and thought content normal.     Assessment/Plan Recurrent right breast cancer Undesired port a cath  Discussed with pt and family risks/benefits including but not limited bleeding/infection/injury to surrounding structures/blood clot/perioperative cardiac/pulm events; scarrring/hematoma/seroma/wound issues.  Will remove while pt under for hysterectomy  All questions asked and answered  Leighton Ruff. Redmond Pulling, MD, FACS General, Bariatric, & Minimally Invasive Surgery [facility]   Greer Pickerel, MD 11/08/2017, 7:12 AM

## 2017-11-08 NOTE — Anesthesia Preprocedure Evaluation (Signed)
Anesthesia Evaluation  Patient identified by MRN, date of birth, ID band Patient awake    Reviewed: Allergy & Precautions, H&P , NPO status , Patient's Chart, lab work & pertinent test results  Airway Mallampati: II   Neck ROM: full    Dental   Pulmonary shortness of breath,    breath sounds clear to auscultation       Cardiovascular negative cardio ROS   Rhythm:regular Rate:Normal     Neuro/Psych    GI/Hepatic   Endo/Other  diabetesobese  Renal/GU      Musculoskeletal   Abdominal   Peds  Hematology  (+) anemia , Hereditary spherocytosis   Anesthesia Other Findings   Reproductive/Obstetrics                             Anesthesia Physical Anesthesia Plan  ASA: II  Anesthesia Plan: General   Post-op Pain Management:    Induction: Intravenous  PONV Risk Score and Plan: 3 and Ondansetron, Dexamethasone, Midazolam, Treatment may vary due to age or medical condition and Scopolamine patch - Pre-op  Airway Management Planned: Oral ETT  Additional Equipment:   Intra-op Plan:   Post-operative Plan: Extubation in OR  Informed Consent: I have reviewed the patients History and Physical, chart, labs and discussed the procedure including the risks, benefits and alternatives for the proposed anesthesia with the patient or authorized representative who has indicated his/her understanding and acceptance.     Plan Discussed with: CRNA, Anesthesiologist and Surgeon  Anesthesia Plan Comments:         Anesthesia Quick Evaluation

## 2017-11-08 NOTE — Progress Notes (Signed)
11/08/2017 8:54 PM 71ml Hydromorphone PCA d/c in sharps, witnessed by Placido Sou RN.  Mary Dougherty, Arville Lime

## 2017-11-08 NOTE — Transfer of Care (Signed)
Immediate Anesthesia Transfer of Care Note  Patient: Mary Dougherty  Procedure(s) Performed: XI ROBOTIC ASSISTED TOTAL HYSTERECTOMY WITH BILATERAL SALPINGO OOPHORECTOMY (Bilateral ) XI ROBOTIC ASSISTED LAPAROSCOPIC LYSIS OF BOWEL ADHESIONS REMOVAL PORT-A-CATH (N/A )  Patient Location: PACU  Anesthesia Type:General  Level of Consciousness: awake, alert  and oriented  Airway & Oxygen Therapy: Patient Spontanous Breathing and Patient connected to face mask oxygen  Post-op Assessment: Report given to RN and Post -op Vital signs reviewed and stable  Post vital signs: Reviewed and stable  Last Vitals:  Vitals Value Taken Time  BP    Temp    Pulse 96 11/08/2017 10:47 AM  Resp    SpO2 97 % 11/08/2017 10:47 AM  Vitals shown include unvalidated device data.  Last Pain:  Vitals:   11/08/17 1594  TempSrc: Oral         Complications: No apparent anesthesia complications

## 2017-11-08 NOTE — Anesthesia Postprocedure Evaluation (Signed)
Anesthesia Post Note  Patient: MARYKATHLEEN RUSSI  Procedure(s) Performed: XI ROBOTIC ASSISTED TOTAL HYSTERECTOMY WITH BILATERAL SALPINGO OOPHORECTOMY (Bilateral ) XI ROBOTIC ASSISTED LAPAROSCOPIC LYSIS OF BOWEL ADHESIONS REMOVAL PORT-A-CATH (N/A )     Patient location during evaluation: PACU Anesthesia Type: General Level of consciousness: awake and alert Pain management: pain level controlled Vital Signs Assessment: post-procedure vital signs reviewed and stable Respiratory status: spontaneous breathing, nonlabored ventilation, respiratory function stable and patient connected to nasal cannula oxygen Cardiovascular status: blood pressure returned to baseline and stable Postop Assessment: no apparent nausea or vomiting Anesthetic complications: no    Last Vitals:  Vitals:   11/08/17 1115 11/08/17 1130  BP: 132/69 138/73  Pulse: 90 90  Resp: 15 12  Temp:  36.8 C  SpO2: 97% 98%    Last Pain:  Vitals:   11/08/17 1130  TempSrc:   PainSc: 0-No pain                 Dalaysia Harms S

## 2017-11-09 ENCOUNTER — Encounter (HOSPITAL_COMMUNITY): Payer: Self-pay | Admitting: Obstetrics and Gynecology

## 2017-11-09 DIAGNOSIS — Z853 Personal history of malignant neoplasm of breast: Secondary | ICD-10-CM | POA: Diagnosis not present

## 2017-11-09 LAB — CBC
HEMATOCRIT: 27.2 % — AB (ref 36.0–46.0)
Hemoglobin: 9.2 g/dL — ABNORMAL LOW (ref 12.0–15.0)
MCH: 31.8 pg (ref 26.0–34.0)
MCHC: 33.8 g/dL (ref 30.0–36.0)
MCV: 94.1 fL (ref 80.0–100.0)
Platelets: 128 10*3/uL — ABNORMAL LOW (ref 150–400)
RBC: 2.89 MIL/uL — ABNORMAL LOW (ref 3.87–5.11)
RDW: 19.9 % — ABNORMAL HIGH (ref 11.5–15.5)
WBC: 13.5 10*3/uL — AB (ref 4.0–10.5)

## 2017-11-09 LAB — BASIC METABOLIC PANEL
ANION GAP: 8 (ref 5–15)
BUN: 10 mg/dL (ref 6–20)
CHLORIDE: 101 mmol/L (ref 98–111)
CO2: 29 mmol/L (ref 22–32)
Calcium: 8.6 mg/dL — ABNORMAL LOW (ref 8.9–10.3)
Creatinine, Ser: 0.67 mg/dL (ref 0.44–1.00)
GFR calc Af Amer: >60 mL/min (ref >60)
GFR calc non Af Amer: >60 mL/min (ref >60)
GLUCOSE: 226 mg/dL — AB (ref 70–99)
POTASSIUM: 4.4 mmol/L (ref 3.5–5.1)
Sodium: 138 mmol/L (ref 135–145)

## 2017-11-09 MED ORDER — OXYCODONE-ACETAMINOPHEN 5-325 MG PO TABS: ORAL_TABLET | ORAL | 0 refills | Status: DC

## 2017-11-09 MED ORDER — TRAMADOL HCL 50 MG PO TABS: 50.0000 mg | ORAL_TABLET | Freq: Four times a day (QID) | ORAL | 0 refills | Status: DC | PRN

## 2017-11-09 MED ORDER — TRAMADOL HCL 50 MG PO TABS
ORAL_TABLET | ORAL | Status: AC
  Filled 2017-11-09: qty 1

## 2017-11-09 NOTE — Progress Notes (Signed)
1 Day Post-Op Procedure(s) (LRB): XI ROBOTIC ASSISTED TOTAL HYSTERECTOMY WITH BILATERAL SALPINGO OOPHORECTOMY (Bilateral) REMOVAL PORT-A-CATH (N/A) XI ROBOTIC ASSISTED LAPAROSCOPIC LYSIS OF BOWEL ADHESIONS  Subjective: Patient reports nausea, incisional pain, tolerating PO, + flatus, + BM and no problems voiding.    Objective: I have reviewed patient's vital signs, intake and output, medications and labs.  General: alert, cooperative and appears stated age Resp: clear to auscultation bilaterally and normal percussion bilaterally Cardio: regular rate and rhythm, S1, S2 normal, no murmur, click, rub or gallop GI: soft, non-tender; bowel sounds normal; no masses,  no organomegaly, normal findings: bowel sounds normal and incision: clean, dry and intact Extremities: extremities normal, atraumatic, no cyanosis or edema and Homans sign is negative, no sign of DVT Vaginal Bleeding: minimal  Assessment: s/p Procedure(s) with comments: XI ROBOTIC ASSISTED TOTAL HYSTERECTOMY WITH BILATERAL SALPINGO OOPHORECTOMY (Bilateral) - [place] for 23 hr obs. REMOVAL PORT-A-CATH (N/A) XI ROBOTIC ASSISTED LAPAROSCOPIC LYSIS OF BOWEL ADHESIONS: stable, progressing well and tolerating diet  Plan: Advance diet Encourage ambulation Advance to PO medication Discontinue IV fluids Discharge home  LOS: 0 days    Mary Dougherty J 11/09/2017, 6:31 AM

## 2017-12-15 ENCOUNTER — Telehealth: Payer: Self-pay

## 2017-12-15 NOTE — Telephone Encounter (Signed)
Due to surgery the patient stated she no longer needed the injections. And to also cancel the appointment with Gudena on 2/14

## 2017-12-17 ENCOUNTER — Inpatient Hospital Stay: Payer: Self-pay

## 2018-01-06 LAB — HIV ANTIBODY (ROUTINE TESTING W REFLEX): HIV Screen 4th Generation wRfx: NONREACTIVE

## 2018-03-18 ENCOUNTER — Ambulatory Visit: Payer: BLUE CROSS/BLUE SHIELD | Admitting: Hematology and Oncology

## 2018-03-18 ENCOUNTER — Ambulatory Visit: Payer: BLUE CROSS/BLUE SHIELD

## 2018-03-21 ENCOUNTER — Telehealth: Payer: Self-pay | Admitting: Family Medicine

## 2018-03-21 NOTE — Telephone Encounter (Signed)
Copied from [place] [phone]. Topic: Quick Communication - Rx Refill/Question >> Mar 21, 2018 12:34 PM Bea Graff, NT wrote: Medication: metFORMIN (GLUCOPHAGE) tablet 500 mg   Has the patient contacted their pharmacy? Yes.   (Agent: If no, request that the patient contact the pharmacy for the refill.) (Agent: If yes, when and what did the pharmacy advise?)  Preferred Pharmacy (with phone number or street name): [facility], [place] [phone] (Phone) [phone] (Fax)    Agent: Please be advised that RX refills may take up to 3 business days. We ask that you follow-up with your pharmacy.

## 2018-03-21 NOTE — Telephone Encounter (Signed)
Pt. Reports she has been off Metformin since last May. Reports she has noticed her blood sugars increasing recently into 200-300's. Started herself back on Metformin. Has just started a new job and is requesting a Saturday appointment and would like a prescription for Metformin called in. Please advise pt.Using [facility] in [place] on [address].

## 2018-03-22 ENCOUNTER — Telehealth: Payer: Self-pay | Admitting: Family Medicine

## 2018-03-22 NOTE — Telephone Encounter (Signed)
Please schedule pt

## 2018-03-22 NOTE — Telephone Encounter (Signed)
Patient returning call to [facility]. Advised of message below and patient would like to be placed on Saturday schedule due to starting job after having partial mastectomy and full hysterectomy and just getting back to work 2 weeks ago. Cannot take time off yet. Would like to be placed on Saturday schedule to try and get her blood sugars under control. Would like a call back.

## 2018-03-22 NOTE — Telephone Encounter (Signed)
Please call pt and schedule appt. Medication refill.   Pt would like to be placed on a Saturday

## 2018-03-22 NOTE — Telephone Encounter (Signed)
I have called the pt. There was no answer so I left a voice mail to call back stating that we do not see the metformin on her med list and she will need an appointment for follow up. She has not been seen here since 5/22019  Thanks, Guadelupe Sabin

## 2018-03-22 NOTE — Telephone Encounter (Signed)
Called and spoke with pt regarding the need for a Saturday appt in order to get metformin. I advised that Nolon Rod first available Saturday was March 29th. Pt stated that she can't wait that long. She needs to be seen now. But, she can't take off from work. I advised pt to call on Friday morning at 7:30 to the [facility] center and make an appt for Saturday. I advised that she needed to call first thing in the morning (7:30) on Friday in order to get an appt as the Saturday appts fill up very quickly. Pt acknowledged.

## 2018-03-23 NOTE — Telephone Encounter (Signed)
Patient was called on 03/22/18 this message was put in the patients chart. Please check the chart before sending scheduling messages so there is no miscommunication.  "Called and spoke with pt regarding the need for a Saturday appt in order to get metformin. I advised that Nolon Rod first available Saturday was March 29th. Pt stated that she can't wait that long. She needs to be seen now. But, she can't take off from work. I advised pt to call on Friday morning at 7:30 to the cll center and make an appt for Saturday. I advised that she needed to call first thing in the morning (7:30) on Friday in order to get an appt as the Saturday appts fill up very quickly. Pt acknowledged. "

## 2018-03-25 ENCOUNTER — Ambulatory Visit: Payer: Self-pay | Admitting: Family Medicine

## 2018-03-25 NOTE — Telephone Encounter (Signed)
  She called in c/o her glucose being high due to being out of Metformin since last week.       See triage notes.     I made her an appt with Dr. Carlota Raspberry for Saturday 2/22 at 8:40.   Reason for Disposition . New onset diabetes suspected (e.g., frequent urination, weakness, weight loss)  Answer Assessment - Initial Assessment Questions 1. BLOOD GLUCOSE: "What is your blood glucose level?"      Been high since last week.   I ran out of metformin last week .   I got my glucose under control with diet.     I just started a new job and I'm stressed out.    I just got off of a surgical leave so I've had a lot of stress. 2. ONSET: "When did you check the blood glucose?"     256 now.   The highest was 366 on Sunday and I felt bad. 3. USUAL RANGE: "What is your glucose level usually?" (e.g., usual fasting morning value, usual evening value)     Usually  Less than 150. 4. KETONES: "Do you check for ketones (urine or blood test strips)?" If yes, ask: "What does the test show now?"      Not checked 5. TYPE 1 or 2:  "Do you know what type of diabetes you have?"  (e.g., Type 1, Type 2, Gestational; doesn't know)      II feel ok until it gets over 280 I feel weak and get a headache.    I have type 2 diabetes. 6. INSULIN: "Do you take insulin?" "What type of insulin(s) do you use? What is the mode of delivery? (syringe, pen (e.g., injection or  pump)?"      No insulin 7. DIABETES PILLS: "Do you take any pills for your diabetes?" If yes, ask: "Have you missed taking any pills recently?"     Metformin. 8. OTHER SYMPTOMS: "Do you have any symptoms?" (e.g., fever, frequent urination, difficulty breathing, dizziness, weakness, vomiting)     I'm thirsty and a little lightheaded when it's high.   That's how I knew it was high.     I have diabetes in my family. 9. PREGNANCY: "Is there any chance you are pregnant?" "When was your last menstrual period?"     Not longer having periods.  Protocols used: DIABETES -  HIGH BLOOD SUGAR-A-AH

## 2018-03-26 ENCOUNTER — Ambulatory Visit: Payer: 59 | Admitting: Family Medicine

## 2018-03-26 ENCOUNTER — Other Ambulatory Visit: Payer: Self-pay

## 2018-03-26 ENCOUNTER — Encounter: Payer: Self-pay | Admitting: Family Medicine

## 2018-03-26 VITALS — BP 117/73 | HR 74 | Temp 98.6°F | Resp 16 | Ht 65.0 in | Wt 195.6 lb

## 2018-03-26 DIAGNOSIS — E119 Type 2 diabetes mellitus without complications: Secondary | ICD-10-CM | POA: Diagnosis not present

## 2018-03-26 DIAGNOSIS — D649 Anemia, unspecified: Secondary | ICD-10-CM

## 2018-03-26 LAB — POCT CBC
Granulocyte percent: 68.8 %G (ref 37–80)
HEMATOCRIT: 30.6 % (ref 29–41)
Hemoglobin: 11 g/dL (ref 11–14.6)
Lymph, poc: 2.5 (ref 0.6–3.4)
MCH, POC: 31.8 pg — AB (ref 27–31.2)
MCHC: 35.8 g/dL — AB (ref 31.8–35.4)
MCV: 88.9 fL (ref 76–111)
MID (CBC): 0.5 (ref 0–0.9)
MPV: 6.4 fL (ref 0–99.8)
POC Granulocyte: 6.7 (ref 2–6.9)
POC LYMPH %: 25.8 % (ref 10–50)
POC MID %: 5.4 % (ref 0–12)
Platelet Count, POC: 175 10*3/uL (ref 142–424)
RBC: 3.44 M/uL — AB (ref 4.04–5.48)
RDW, POC: 20.1 %
WBC: 9.8 10*3/uL (ref 4.6–10.2)

## 2018-03-26 LAB — POCT GLYCOSYLATED HEMOGLOBIN (HGB A1C): Hemoglobin A1C: 7.8 % — AB (ref 4.0–5.6)

## 2018-03-26 LAB — POCT URINALYSIS DIP (MANUAL ENTRY)
Bilirubin, UA: NEGATIVE
Blood, UA: NEGATIVE
GLUCOSE UA: NEGATIVE mg/dL
Ketones, POC UA: NEGATIVE mg/dL
NITRITE UA: NEGATIVE
Spec Grav, UA: 1.02 (ref 1.010–1.025)
Urobilinogen, UA: 1 E.U./dL
pH, UA: 5.5 (ref 5.0–8.0)

## 2018-03-26 LAB — GLUCOSE, POCT (MANUAL RESULT ENTRY): POC Glucose: 222 mg/dl — AB (ref 70–99)

## 2018-03-26 MED ORDER — METFORMIN HCL 500 MG PO TABS: 500.0000 mg | ORAL_TABLET | Freq: Two times a day (BID) | ORAL | 1 refills | Status: DC

## 2018-03-26 NOTE — Patient Instructions (Addendum)
Blood sugar elevated, but A1c is not that bad.  Restarting metformin should help. Can try increasing to twice per day if tolerated (if diarrhea, then once per day ok for now).  waking for exercise can also help blood sugars.   recheck 3 months.   Type 2 Diabetes Mellitus, Self Care, Adult When you have type 2 diabetes (type 2 diabetes mellitus), you must make sure your blood sugar (glucose) stays in a healthy range. You can do this with:  Nutrition.  Exercise.  Lifestyle changes.  Medicines or insulin, if needed.  Support from your doctors and others. How to stay aware of blood sugar   Check your blood sugar level every day, as often as told.  Have your A1c (hemoglobin A1c) level checked two or more times a year. Have it checked more often if your doctor tells you to. Your doctor will set personal treatment goals for you. Generally, you should have these blood sugar levels:  Before meals (preprandial): 80-130 mg/dL (4.4-7.2 mmol/L).  After meals (postprandial): below 180 mg/dL (10 mmol/L).  A1c level: less than 7%. How to manage high and low blood sugar Signs of high blood sugar High blood sugar is called hyperglycemia. Know the signs of high blood sugar. Signs may include:  Feeling: ? Thirsty. ? Hungry. ? Very tired.  Needing to pee (urinate) more than usual.  Blurry vision. Signs of low blood sugar Low blood sugar is called hypoglycemia. This is when blood sugar is at or below 70 mg/dL (3.9 mmol/L). Signs may include:  Feeling: ? Hungry. ? Worried or nervous (anxious). ? Sweaty and clammy. ? Confused. ? Dizzy. ? Sleepy. ? Sick to your stomach (nauseous).  Having: ? A fast heartbeat. ? A headache. ? A change in your vision. ? Jerky movements that you cannot control (seizure). ? Tingling or no feeling (numbness) around your mouth, lips, or tongue.  Having trouble with: ? Moving (coordination). ? Sleeping. ? Passing out (fainting). ? Getting upset  easily (irritability). Treating low blood sugar To treat low blood sugar, eat or drink something sugary right away. If you can think clearly and swallow safely, follow the 15:15 rule:  Take 15 grams of a fast-acting carb (carbohydrate). Talk with your doctor about how much you should take.  Some fast-acting carbs are: ? Sugar tablets (glucose pills). Take 3-4 pills. ? 6-8 pieces of hard candy. ? 4-6 oz (120-150 mL) of fruit juice. ? 4-6 oz (120-150 mL) of regular (not diet) soda. ? 1 Tbsp (15 mL) honey or sugar.  Check your blood sugar 15 minutes after you take the carb.  If your blood sugar is still at or below 70 mg/dL (3.9 mmol/L), take 15 grams of a carb again.  If your blood sugar does not go above 70 mg/dL (3.9 mmol/L) after 3 tries, get help right away.  After your blood sugar goes back to normal, eat a meal or a snack within 1 hour. Treating very low blood sugar If your blood sugar is at or below 54 mg/dL (3 mmol/L), you have very low blood sugar (severe hypoglycemia). This is an emergency. Do not wait to see if the symptoms will go away. Get medical help right away. Call your local emergency services (911 in the [place] If you have very low blood sugar and you cannot eat or drink, you may need a glucagon shot (injection). A family member or friend should learn how to check your blood sugar and how to give you a glucagon  shot. Ask your doctor if you need to have a glucagon shot kit at home. Follow these instructions at home: Medicine  Take insulin and diabetes medicines as told.  If your doctor says you should take more or less insulin and medicines, do this exactly as told.  Do not run out of insulin or medicines. Having diabetes can raise your risk for other long-term conditions. These include heart disease and kidney disease. Your doctor may prescribe medicines to help you not have these problems. Food   Make healthy food choices. These include: ? Chicken, fish, egg  whites, and beans. ? Oats, whole wheat, bulgur, brown rice, quinoa, and millet. ? Fresh fruits and vegetables. ? Low-fat dairy products. ? Nuts, avocado, olive oil, and canola oil.  Meet with a food specialist (dietitian). He or she can help you make an eating plan that is right for you.  Follow instructions from your doctor about what you cannot eat or drink.  Drink enough fluid to keep your pee (urine) pale yellow.  Keep track of carbs that you eat. Do this by reading food labels and learning food serving sizes.  Follow your sick day plan when you cannot eat or drink normally. Make this plan with your doctor so it is ready to use. Activity  Exercise 3 or more times a week.  Do not go more than 2 days without exercising.  Talk with your doctor before you start a new exercise. Your doctor may need to tell you to change: ? How much insulin or medicines you take. ? How much food you eat. Lifestyle  Do not use any tobacco products. These include cigarettes, chewing tobacco, and e-cigarettes. If you need help quitting, ask your doctor.  Ask your doctor how much alcohol is safe for you.  Learn to deal with stress. If you need help with this, ask your doctor. Body care   Stay up to date with your shots (immunizations).  Have your eyes and feet checked by a doctor as often as told.  Check your skin and feet every day. Check for cuts, bruises, redness, blisters, or sores.  Brush your teeth and gums two times a day. Floss one or more times a day.  Go to the dentist one or more times every 6 months.  Stay at a healthy weight. General instructions  Take over-the-counter and prescription medicines only as told by your doctor.  Share your diabetes care plan with: ? Your work or school. ? People you live with.  Carry a card or wear jewelry that says you have diabetes.  Keep all follow-up visits as told by your doctor. This is important. Questions to ask your doctor  Do I  need to meet with a diabetes educator?  Where can I find a support group for people with diabetes? Where to find more information To learn more about diabetes, visit:  American Diabetes Association: www.diabetes.org  American Association of Diabetes Educators: www.diabeteseducator.org Summary  When you have type 2 diabetes, you must make sure your blood sugar (glucose) stays in a healthy range.  Check your blood sugar every day, as often as told.  Having diabetes can raise your risk for other conditions. Your doctor may prescribe medicines to help you not have these problems.  Keep all follow-up visits as told by your doctor. This is important. This information is not intended to replace advice given to you by your health care provider. Make sure you discuss any questions you have with your health  care provider. Document Released: 05/13/2015 Document Revised: 07/12/2017 Document Reviewed: 02/22/2015 Elsevier Interactive Patient Education  [employer].    If you have lab work done today you will be contacted with your lab results within the next 2 weeks.  If you have not heard from [place] then please contact us. The fastest way to get your results is to register for My Chart.   IF you received an x-ray today, you will receive an invoice from [facility]. Please contact [facility] at [phone] with questions or concerns regarding your invoice.   IF you received labwork today, you will receive an invoice from [facility]. Please contact [facility] at [phone] with questions or concerns regarding your invoice.   Our billing staff will not be able to assist you with questions regarding bills from these companies.  You will be contacted with the lab results as soon as they are available. The fastest way to get your results is to activate your My Chart account. Instructions are located on the last page of this paperwork. If you have not heard from [place] regarding the  results in 2 weeks, please contact this office.

## 2018-03-26 NOTE — Progress Notes (Signed)
Subjective:    Patient ID: Mary Dougherty, female    DOB: 1971/01/05, 48 y.o.   MRN: [id number]  HPI Mary Dougherty is a 48 y.o. female Presents today for: Chief Complaint  Patient presents with  . Elevated Glucose    per pt "out of Metformin x 4 days ago" pt will have an eye exam and have results sent over  IEP:PIRJJOACZ, Zoe A, MD  Diabetes:  Previously on Metformin. Took from March last year to May. Off metformin for awhile.New job with increased stress Restarted remaining 5 metformin pills when higher readings at home. Noticed HA, no N/v, some increased thirst, no polyuria, no blurry vision. Slight lightheaded. Checked blood sugar - Sugars running higher. Up to 366 last week Restarted metformin '500mg'$  QD.  256 yesterday. 294 day prior. Still some lightheadedness, but better.  Min loose stool initially with metformin.   Hist of anemia, prior surgeries -breast cancer, then hysterectomy in 11/2017.  No recent vaginal bleeding, no dark stools.  No iron supplements.  Hx spherocytosis. Hematology/Onc - Dr. Lindi Adie - appt next month. Had transfusions in past.  Usual HGB in 9 range. Notices symptoms less than 8.  Lab Results  Component Value Date   WBC 13.5 (H) 11/09/2017   HGB 9.2 (L) 11/09/2017   HCT 27.2 (L) 11/09/2017   MCV 94.1 11/09/2017   PLT 128 (L) 11/09/2017     Microalbumin: will check today.  Optho, foot exam, pneumovax: Will schedule optho.  utd on pneumovax.   Diabetic Foot Exam - Simple   Simple Foot Form Diabetic Foot exam was performed with the following findings:  Yes 03/26/2018  8:45 AM  Visual Inspection No deformities, no ulcerations, no other skin breakdown bilaterally:  Yes Sensation Testing Intact to touch and monofilament testing bilaterally:  Yes Pulse Check Comments Pedal pulse felt in left foot, couldn't feel in right foot.   R pedal pulse faint but palpable.    Lab Results  Component Value Date   HGBA1C 5.8 (A) 06/26/2017   HGBA1C  7.9 05/01/2017   HGBA1C 5.1 01/25/2015   Lab Results  Component Value Date   LDLCALC 68 01/25/2015   CREATININE 0.67 11/09/2017     Patient Active Problem List   Diagnosis Date Noted  . Breast cancer ([place]) 11/08/2017  . Breast cancer, right ([place]) 10/05/2017  . Port-A-Cath in place 08/13/2017  . Type 2 diabetes mellitus with complication, without long-term current use of insulin ([place]) 06/26/2017  . Elevated bilirubin 06/26/2017  . Neoplastic malignant related fatigue 06/26/2017  . Syncope 06/11/2017  . Anemia 06/11/2017  . Diabetes mellitus without complication ([place]) 66/07/3014  . Malignant neoplasm of lower-outer quadrant of right breast of female, estrogen receptor positive ([place]) 04/28/2017  . HS (hereditary spherocytosis) ([place]) 09/20/2013  . Anemia, unspecified 01/27/2013  . Syncope, vasovagal 01/17/2013    Class: Chronic  . Genital herpes 10/14/2012  . Breast cancer of lower-outer quadrant of right female breast ([place]) 09/08/2012   Past Medical History:  Diagnosis Date  . Anemia   . Blood transfusion without reported diagnosis   . Chronic anemia 03/03/13  . Dyspnea    with exertion, during chemo  . Family history of adverse reaction to anesthesia     father- hard to awaken  . Hereditary spherocytosis ([place])   . Invasive ductal carcinoma of right breast ([place]) 09/2012   ER(99%), PR(100%), Ki-67(70%), 1/5 nodes positive  . Pre-diabetes    off metformin now  . Recurrent breast cancer,  right ([place]) 09/2017  . S/P radiation therapy 05/08/2013-06/22/2013   1) Right breast / 50.4 Gy in 28 fractions / 2) Right Supraclavicular fossa/ 50.4 Gy in 28 fractions /3) Right Posterior Axillary boost / 11.9 Gy in 28 fractions /4) Right breast boost / 10 Gy in 5 fractions   . Spherocytosis ([place]) 1989  . Status post chemotherapy 10/07/12 - 12/16/12    Neoadjuvant chemotherapy with dose dense FEC (5-FU/epirubicin/Cytoxan) from 10/07/2012 through 12/16/12.    Marland Kitchen Syncope and collapse 1989   "thats  when I found out I have Spherocytosis"  . Thyroid disease   . Use of exemestane (Aromasin) 07/25/13  . Use of goserelin acetate (Zoladex) 07/25/13  . Wears glasses    Past Surgical History:  Procedure Laterality Date  . BREAST LUMPECTOMY WITH SENTINEL LYMPH NODE BIOPSY Right 04/06/2013   [name]  . BREAST SURGERY     left breast lumpectomy/left ax snbx  . COLONOSCOPY  2015  . MASTECTOMY W/ SENTINEL NODE BIOPSY Right 10/05/2017   Procedure: RIGHT MASTECTOMY WITH RIGHT SENTINEL LYMPH NODE BIOPSY;  Surgeon: [name], MD;  Location: [facility];  Service: General;  Laterality: Right;  . PORT-A-CATH REMOVAL N/A 04/06/2013   Procedure: REMOVAL PORT-A-CATH;  Surgeon: [name], MD;  Location: [facility];  Service: General;  Laterality: N/A;  . PORT-A-CATH REMOVAL N/A 11/08/2017   Procedure: REMOVAL PORT-A-CATH;  Surgeon: Greer Pickerel, MD;  Location: [facility];  Service: General;  Laterality: N/A;  . PORTACATH PLACEMENT Left 09/20/2012   Procedure: INSERTION PORT-A-CATH;  Surgeon: [name], MD;  Location: [facility];  Service: General;  Laterality: Left;  . PORTACATH PLACEMENT Right 05/18/2017   Procedure: INSERTION PORT-A-CATH WITH ULTRASOUND;  Surgeon: [name], MD;  Location: [facility];  Service: General;  Laterality: Right;  . ROBOTIC ASSISTED LAPAROSCOPIC LYSIS OF ADHESION  11/08/2017   Procedure: XI ROBOTIC ASSISTED LAPAROSCOPIC LYSIS OF BOWEL ADHESIONS;  Surgeon: Brien Few, MD;  Location: [facility];  Service: Gynecology;;  . ROBOTIC ASSISTED TOTAL HYSTERECTOMY WITH BILATERAL SALPINGO OOPHERECTOMY Bilateral 11/08/2017   Procedure: XI ROBOTIC ASSISTED TOTAL HYSTERECTOMY WITH BILATERAL SALPINGO OOPHORECTOMY;  Surgeon: Brien Few, MD;  Location: [facility];  Service: Gynecology;  Laterality: Bilateral;  [place] for 23 hr obs.  . WISDOM TOOTH EXTRACTION     No Known Allergies Prior to Admission medications   Medication  Sig Start Date End Date Taking? Authorizing Provider  exemestane (AROMASIN) 25 MG tablet Take 1 tablet (25 mg total) by mouth daily after breakfast. Patient taking differently: Take 25 mg by mouth daily with lunch.  10/25/17  Yes Nicholas Lose, MD  folic acid (FOLVITE) 700 MCG tablet Take 800 mcg by mouth daily at 12 noon.    Yes [provider]  traMADol (ULTRAM) 50 MG tablet Take 1 tablet (50 mg total) by mouth every 6 (six) hours as needed for moderate pain. 11/09/17  Yes Brien Few, MD  oxyCODONE-acetaminophen (PERCOCET/ROXICET) 5-325 MG tablet May take 1-2 tablets by mouth every 4 (four) hours as needed for severe pain. May also take 1-2 tablets every 4 (four) hours as needed for severe pain. Patient not taking: Reported on 03/26/2018 11/09/17   Brien Few, MD   Social History   Socioeconomic History  . Marital status: Single    Spouse name: Not on file  . Number of children: Not on file  . Years of education: Not on file  . Highest education level: Not on file  Occupational History  .  Not on file  Social Needs  . Financial resource strain: Not on file  . Food insecurity:    Worry: Not on file    Inability: Not on file  . Transportation needs:    Medical: Not on file    Non-medical: Not on file  Tobacco Use  . Smoking status: Never Smoker  . Smokeless tobacco: Never Used  Substance and Sexual Activity  . Alcohol use: No  . Drug use: No  . Sexual activity: Not Currently    Birth control/protection: Post-menopausal    Comment: menarche age 60, P62,  last menstrual cycle 08/26/2012  Lifestyle  . Physical activity:    Days per week: Not on file    Minutes per session: Not on file  . Stress: Not on file  Relationships  . Social connections:    Talks on phone: Not on file    Gets together: Not on file    Attends religious service: Not on file    Active member of club or organization: Not on file    Attends meetings of clubs or organizations: Not on file     Relationship status: Not on file  . Intimate partner violence:    Fear of current or ex partner: Not on file    Emotionally abused: Not on file    Physically abused: Not on file    Forced sexual activity: Not on file  Other Topics Concern  . Not on file  Social History Narrative  . Not on file    Review of Systems     Objective:   Physical Exam Vitals signs reviewed.  Constitutional:      Appearance: She is well-developed.  HENT:     Head: Normocephalic and atraumatic.  Eyes:     Conjunctiva/sclera: Conjunctivae normal.     Pupils: Pupils are equal, round, and reactive to light.  Neck:     Vascular: No carotid bruit.  Cardiovascular:     Rate and Rhythm: Normal rate and regular rhythm.     Heart sounds: Normal heart sounds.  Pulmonary:     Effort: Pulmonary effort is normal.     Breath sounds: Normal breath sounds.  Abdominal:     Palpations: Abdomen is soft. There is no pulsatile mass.     Tenderness: There is no abdominal tenderness.  Skin:    General: Skin is warm and dry.  Neurological:     Mental Status: She is alert and oriented to person, place, and time.  Psychiatric:        Behavior: Behavior normal.     Vitals:   03/26/18 0838  BP: 117/73  Pulse: 74  Resp: 16  Temp: 98.6 F (37 C)  TempSrc: Oral  SpO2: 97%  Weight: 195 lb 9.6 oz (88.7 kg)  Height: '5\' 5"'$  (1.651 m)    Results for orders placed or performed in visit on 03/26/18  POCT glycosylated hemoglobin (Hb A1C)  Result Value Ref Range   Hemoglobin A1C 7.8 (A) 4.0 - 5.6 %   HbA1c POC (<> result, manual entry)     HbA1c, POC (prediabetic range)     HbA1c, POC (controlled diabetic range)    POCT glucose (manual entry)  Result Value Ref Range   POC Glucose 222 (A) 70 - 99 mg/dl  POCT urinalysis dipstick  Result Value Ref Range   Color, UA yellow yellow   Clarity, UA clear clear   Glucose, UA negative negative mg/dL   Bilirubin, UA negative negative  Ketones, POC UA negative negative  mg/dL   Spec Grav, UA 1.020 1.010 - 1.025   Blood, UA negative negative   pH, UA 5.5 5.0 - 8.0   Protein Ur, POC trace (A) negative mg/dL   Urobilinogen, UA 1.0 0.2 or 1.0 E.U./dL   Nitrite, UA Negative Negative   Leukocytes, UA Trace (A) Negative  POCT CBC  Result Value Ref Range   WBC 9.8 4.6 - 10.2 K/uL   Lymph, poc 2.5 0.6 - 3.4   POC LYMPH PERCENT 25.8 10 - 50 %L   MID (cbc) 0.5 0 - 0.9   POC MID % 5.4 0 - 12 %M   POC Granulocyte 6.7 2 - 6.9   Granulocyte percent 68.8 37 - 80 %G   RBC 3.44 (A) 4.04 - 5.48 M/uL   Hemoglobin 11.0 11 - 14.6 g/dL   HCT, POC 30.6 29 - 41 %   MCV 88.9 76 - 111 fL   MCH, POC 31.8 (A) 27 - 31.2 pg   MCHC 35.8 (A) 31.8 - 35.4 g/dL   RDW, POC 20.1 %   Platelet Count, POC 175 142 - 424 K/uL   MPV 6.4 0 - 99.8 fL   No dysuria, frequency/abd pain.     Assessment & Plan:    Mary Dougherty is a 48 y.o. female Type 2 diabetes mellitus without complication, without long-term current use of insulin ([place]) - Plan: POCT glycosylated hemoglobin (Hb A1C), POCT glucose (manual entry), POCT urinalysis dipstick, HM Diabetes Foot Exam, Comprehensive metabolic panel, Lipid panel, Microalbumin / creatinine urine ratio, metFORMIN (GLUCOPHAGE) 500 MG tablet  Anemia, unspecified type - Plan: POCT CBC  Return of elevated readings at home, A1c only slightly elevated, so suspect recent increases.   -Has restarted metformin 500 mg daily, minimal loose stools initially.  If tolerated can increase up to twice daily or remain at daily if needed for GI intolerance.  -Walking for exercise should also help glycemic control  -Labs as above  -Plans to schedule ophthalmology visit  -Recheck 3 months  -CBC improved.  Meds ordered this encounter  Medications  . metFORMIN (GLUCOPHAGE) 500 MG tablet    Sig: Take 1 tablet (500 mg total) by mouth 2 (two) times daily with a meal.    Dispense:  180 tablet    Refill:  1   Patient Instructions   Blood sugar elevated, but A1c  is not that bad.  Restarting metformin should help. Can try increasing to twice per day if tolerated (if diarrhea, then once per day ok for now).  waking for exercise can also help blood sugars.   recheck 3 months.   Type 2 Diabetes Mellitus, Self Care, Adult When you have type 2 diabetes (type 2 diabetes mellitus), you must make sure your blood sugar (glucose) stays in a healthy range. You can do this with:  Nutrition.  Exercise.  Lifestyle changes.  Medicines or insulin, if needed.  Support from your doctors and others. How to stay aware of blood sugar   Check your blood sugar level every day, as often as told.  Have your A1c (hemoglobin A1c) level checked two or more times a year. Have it checked more often if your doctor tells you to. Your doctor will set personal treatment goals for you. Generally, you should have these blood sugar levels:  Before meals (preprandial): 80-130 mg/dL (4.4-7.2 mmol/L).  After meals (postprandial): below 180 mg/dL (10 mmol/L).  A1c level: less than 7%. How to manage  high and low blood sugar Signs of high blood sugar High blood sugar is called hyperglycemia. Know the signs of high blood sugar. Signs may include:  Feeling: ? Thirsty. ? Hungry. ? Very tired.  Needing to pee (urinate) more than usual.  Blurry vision. Signs of low blood sugar Low blood sugar is called hypoglycemia. This is when blood sugar is at or below 70 mg/dL (3.9 mmol/L). Signs may include:  Feeling: ? Hungry. ? Worried or nervous (anxious). ? Sweaty and clammy. ? Confused. ? Dizzy. ? Sleepy. ? Sick to your stomach (nauseous).  Having: ? A fast heartbeat. ? A headache. ? A change in your vision. ? Jerky movements that you cannot control (seizure). ? Tingling or no feeling (numbness) around your mouth, lips, or tongue.  Having trouble with: ? Moving (coordination). ? Sleeping. ? Passing out (fainting). ? Getting upset easily (irritability). Treating  low blood sugar To treat low blood sugar, eat or drink something sugary right away. If you can think clearly and swallow safely, follow the 15:15 rule:  Take 15 grams of a fast-acting carb (carbohydrate). Talk with your doctor about how much you should take.  Some fast-acting carbs are: ? Sugar tablets (glucose pills). Take 3-4 pills. ? 6-8 pieces of hard candy. ? 4-6 oz (120-150 mL) of fruit juice. ? 4-6 oz (120-150 mL) of regular (not diet) soda. ? 1 Tbsp (15 mL) honey or sugar.  Check your blood sugar 15 minutes after you take the carb.  If your blood sugar is still at or below 70 mg/dL (3.9 mmol/L), take 15 grams of a carb again.  If your blood sugar does not go above 70 mg/dL (3.9 mmol/L) after 3 tries, get help right away.  After your blood sugar goes back to normal, eat a meal or a snack within 1 hour. Treating very low blood sugar If your blood sugar is at or below 54 mg/dL (3 mmol/L), you have very low blood sugar (severe hypoglycemia). This is an emergency. Do not wait to see if the symptoms will go away. Get medical help right away. Call your local emergency services (911 in the [place] If you have very low blood sugar and you cannot eat or drink, you may need a glucagon shot (injection). A family member or friend should learn how to check your blood sugar and how to give you a glucagon shot. Ask your doctor if you need to have a glucagon shot kit at home. Follow these instructions at home: Medicine  Take insulin and diabetes medicines as told.  If your doctor says you should take more or less insulin and medicines, do this exactly as told.  Do not run out of insulin or medicines. Having diabetes can raise your risk for other long-term conditions. These include heart disease and kidney disease. Your doctor may prescribe medicines to help you not have these problems. Food   Make healthy food choices. These include: ? Chicken, fish, egg whites, and beans. ? Oats, whole  wheat, bulgur, brown rice, quinoa, and millet. ? Fresh fruits and vegetables. ? Low-fat dairy products. ? Nuts, avocado, olive oil, and canola oil.  Meet with a food specialist (dietitian). He or she can help you make an eating plan that is right for you.  Follow instructions from your doctor about what you cannot eat or drink.  Drink enough fluid to keep your pee (urine) pale yellow.  Keep track of carbs that you eat. Do this by reading food labels and  learning food serving sizes.  Follow your sick day plan when you cannot eat or drink normally. Make this plan with your doctor so it is ready to use. Activity  Exercise 3 or more times a week.  Do not go more than 2 days without exercising.  Talk with your doctor before you start a new exercise. Your doctor may need to tell you to change: ? How much insulin or medicines you take. ? How much food you eat. Lifestyle  Do not use any tobacco products. These include cigarettes, chewing tobacco, and e-cigarettes. If you need help quitting, ask your doctor.  Ask your doctor how much alcohol is safe for you.  Learn to deal with stress. If you need help with this, ask your doctor. Body care   Stay up to date with your shots (immunizations).  Have your eyes and feet checked by a doctor as often as told.  Check your skin and feet every day. Check for cuts, bruises, redness, blisters, or sores.  Brush your teeth and gums two times a day. Floss one or more times a day.  Go to the dentist one or more times every 6 months.  Stay at a healthy weight. General instructions  Take over-the-counter and prescription medicines only as told by your doctor.  Share your diabetes care plan with: ? Your work or school. ? People you live with.  Carry a card or wear jewelry that says you have diabetes.  Keep all follow-up visits as told by your doctor. This is important. Questions to ask your doctor  Do I need to meet with a diabetes  educator?  Where can I find a support group for people with diabetes? Where to find more information To learn more about diabetes, visit:  American Diabetes Association: www.diabetes.org  American Association of Diabetes Educators: www.diabeteseducator.org Summary  When you have type 2 diabetes, you must make sure your blood sugar (glucose) stays in a healthy range.  Check your blood sugar every day, as often as told.  Having diabetes can raise your risk for other conditions. Your doctor may prescribe medicines to help you not have these problems.  Keep all follow-up visits as told by your doctor. This is important. This information is not intended to replace advice given to you by your health care provider. Make sure you discuss any questions you have with your health care provider. Document Released: 05/13/2015 Document Revised: 07/12/2017 Document Reviewed: 02/22/2015 Elsevier Interactive Patient Education  [employer].    If you have lab work done today you will be contacted with your lab results within the next 2 weeks.  If you have not heard from [place] then please contact us. The fastest way to get your results is to register for My Chart.   IF you received an x-ray today, you will receive an invoice from [facility]. Please contact [facility] at [phone] with questions or concerns regarding your invoice.   IF you received labwork today, you will receive an invoice from [facility]. Please contact [facility] at [phone] with questions or concerns regarding your invoice.   Our billing staff will not be able to assist you with questions regarding bills from these companies.  You will be contacted with the lab results as soon as they are available. The fastest way to get your results is to activate your My Chart account. Instructions are located on the last page of this paperwork. If you have not heard from [place] regarding the results in  2 weeks, please  contact this office.      Signed,   Merri Ray, MD [facility].  03/26/18 2:57 PM

## 2018-03-27 LAB — MICROALBUMIN / CREATININE URINE RATIO
Creatinine, Urine: 95.2 mg/dL
MICROALBUM., U, RANDOM: 43.2 ug/mL
Microalb/Creat Ratio: 45 mg/g creat — ABNORMAL HIGH (ref 0–29)

## 2018-03-27 LAB — COMPREHENSIVE METABOLIC PANEL
ALBUMIN: 4.1 g/dL (ref 3.8–4.8)
ALT: 37 IU/L — ABNORMAL HIGH (ref 0–32)
AST: 25 IU/L (ref 0–40)
Albumin/Globulin Ratio: 1.4 (ref 1.2–2.2)
Alkaline Phosphatase: 108 IU/L (ref 39–117)
BUN / CREAT RATIO: 10 (ref 9–23)
BUN: 6 mg/dL (ref 6–24)
Bilirubin Total: 1.4 mg/dL — ABNORMAL HIGH (ref 0.0–1.2)
CO2: 25 mmol/L (ref 20–29)
CREATININE: 0.61 mg/dL (ref 0.57–1.00)
Calcium: 9.1 mg/dL (ref 8.7–10.2)
Chloride: 99 mmol/L (ref 96–106)
GFR calc Af Amer: 123 mL/min/{1.73_m2} (ref >59)
GFR calc non Af Amer: 107 mL/min/{1.73_m2} (ref >59)
GLUCOSE: 214 mg/dL — AB (ref 65–99)
Globulin, Total: 3 g/dL (ref 1.5–4.5)
Potassium: 4.5 mmol/L (ref 3.5–5.2)
Sodium: 138 mmol/L (ref 134–144)
TOTAL PROTEIN: 7.1 g/dL (ref 6.0–8.5)

## 2018-03-27 LAB — LIPID PANEL
Chol/HDL Ratio: 3.4 ratio (ref 0.0–4.4)
Cholesterol, Total: 114 mg/dL (ref 100–199)
HDL: 34 mg/dL — AB (ref >39)
LDL CALC: 60 mg/dL (ref 0–99)
Triglycerides: 98 mg/dL (ref 0–149)
VLDL CHOLESTEROL CAL: 20 mg/dL (ref 5–40)

## 2018-04-20 NOTE — Progress Notes (Signed)
Patient Care Team: Forrest Moron, MD as PCP - General (Internal Medicine) Nicholas Lose, MD as Consulting Physician (Hematology and Oncology)  DIAGNOSIS:    ICD-10-CM   1. Malignant neoplasm of lower-outer quadrant of right breast of female, estrogen receptor positive ([place]) C50.511    Z17.0     SUMMARY OF ONCOLOGIC HISTORY:   Breast cancer of lower-outer quadrant of right female breast ([place])   09/08/2012 Initial Diagnosis    Breast cancer of lower-outer quadrant of right female breast: ER 99% PR 100% HER-2 negative, Ki-67 70%    10/07/2012 - 03/17/2013 Neo-Adjuvant Chemotherapy    Dose dense FEC followed by weekly Taxol    04/06/2013 Surgery    Right breast lumpectomy residual 1.1 cm IDC grade 3 with high-grade DCIS, PNI +ve, 1/2 SLN positive, no MVI, T1 C. N1 A. stage IIA, ER 97% PR 95% HER-2 1.1 ratio negative    05/08/2013 - 06/22/2013 Radiation Therapy    Radiation therapy to lumpectomy site    07/03/2013 - 04/27/2017 Anti-estrogen oral therapy    Zoladex plus Aromasin     Procedure    Genetic testing:PTEN C.-([id number])del 26 on [employer] testing    04/15/2017 Relapse/Recurrence    Right breast biopsy: Invasive ductal carcinoma, grade 3, ER 10%, PR 0%, Ki-67 30%, HER-2 equivocal, ratio 1.36, copy #5.2, IHC 1+ negative    04/22/2017 Breast MRI    Malignancy LOQ right breast 2.3 cm within the lumpectomy scar, left breast normal    06/02/2017 - 09/03/2017 Neo-Adjuvant Chemotherapy    Carboplatin and gemcitabine neoadjuvant chemotherapy    06/11/2017 - 06/12/2017 Hospital Admission    Syncope due to anemia required blood transfusion    09/23/2017 Breast MRI    Persistent enhancement right breast malignancy at 6:00 2.4 x 1.7 x 0.8 cm enhancement in the right nipple could be physiologic     10/05/2017 Surgery    Right mastectomy: IDC grade 3, 3 cm, with high-grade DCIS, margins negative, 0/2 lymph nodes negative, ER 10%, PR 0%, HER-2 negative, Ki-67 30% ypT2ypN0     10/25/2017 -   Anti-estrogen oral therapy    Aromasin 58m daily, plan for 5 years     Malignant neoplasm of lower-outer quadrant of right breast of [place], estrogen [place] positive [place]    CHIEF COMPLIANT: Surveillance of breast cancer on exemestane therapy  INTERVAL HISTORY: Mary LANNOMis a 48y.o. with above-mentioned history of triple negative recurrent breast cancer who underwent a right mastectomy. In 11/2017 she underwent a total hysterectomy and bilateral salpingo oophorectomy. She is currently on antiestrogen therapy with exemestane because the tumor is faintly ER positive. She presents to the clinic today for 613-monthollow-up.  She is tolerating exemestane extremely well.  Upon review of her medications she informed me that she is not taking metformin regularly.  She is also not been checking her blood sugars.  REVIEW OF SYSTEMS:   Constitutional: Denies fevers, chills or abnormal weight loss Eyes: Denies blurriness of vision Ears, nose, mouth, throat, and face: Denies mucositis or sore throat Respiratory: Denies cough, dyspnea or wheezes Cardiovascular: Denies palpitation, chest discomfort Gastrointestinal: Denies nausea, heartburn or change in bowel habits Skin: Denies abnormal skin rashes Lymphatics: Denies new lymphadenopathy or easy bruising Neurological: Denies numbness, tingling or new weaknesses Behavioral/Psych: Mood is stable, no new changes  Extremities: No lower extremity edema Breast: denies any pain or lumps or nodules in either breasts All other systems were reviewed with the patient and are negative.  I have reviewed the past medical history, past surgical history, social history and family history with the patient and they are unchanged from previous note.  ALLERGIES:  has No Known Allergies.  MEDICATIONS:  Current Outpatient Medications  Medication Sig Dispense Refill  . exemestane (AROMASIN) 25 MG tablet Take 1 tablet (25 mg total) by mouth daily after  breakfast. (Patient taking differently: Take 25 mg by mouth daily with lunch. )    . folic acid (FOLVITE) 937 MCG tablet Take 800 mcg by mouth daily at 12 noon.     . metFORMIN (GLUCOPHAGE) 500 MG tablet Take 1 tablet (500 mg total) by mouth 2 (two) times daily with a meal. 180 tablet 1  . oxyCODONE-acetaminophen (PERCOCET/ROXICET) 5-325 MG tablet May take 1-2 tablets by mouth every 4 (four) hours as needed for severe pain. May also take 1-2 tablets every 4 (four) hours as needed for severe pain. (Patient not taking: Reported on 03/26/2018) 30 tablet 0  . traMADol (ULTRAM) 50 MG tablet Take 1 tablet (50 mg total) by mouth every 6 (six) hours as needed for moderate pain. 30 tablet 0   No current facility-administered medications for this visit.     PHYSICAL EXAMINATION: ECOG PERFORMANCE STATUS: 1 - Symptomatic but completely ambulatory  Vitals:   04/25/18 0817  BP: 116/63  Pulse: 79  Resp: 17  Temp: 97.7 F (36.5 C)  SpO2: 100%   Filed Weights   04/25/18 0817  Weight: 192 lb 6.4 oz (87.3 kg)    GENERAL: alert, no distress and comfortable SKIN: skin color, texture, turgor are normal, no rashes or significant lesions EYES: normal, Conjunctiva are pink and non-injected, sclera clear OROPHARYNX: no exudate, no erythema and lips, buccal mucosa, and tongue normal  NECK: supple, thyroid normal size, non-tender, without nodularity LYMPH: no palpable lymphadenopathy in the cervical, axillary or inguinal LUNGS: clear to auscultation and percussion with normal breathing effort HEART: regular rate & rhythm and no murmurs and no lower extremity edema ABDOMEN: abdomen soft, non-tender and normal bowel sounds MUSCULOSKELETAL: no cyanosis of digits and no clubbing  NEURO: alert & oriented x 3 with fluent speech, no focal motor/sensory deficits EXTREMITIES: No lower extremity edema    LABORATORY DATA:  I have reviewed the data as listed CMP Latest Ref Rng & Units 03/26/2018 11/09/2017 11/08/2017   Glucose 65 - 99 mg/dL 214(H) 226(H) 159(H)  BUN 6 - 24 mg/dL _0 Creatinine 0.57 - 1.00 mg/dL 0.61 0.67 0.54  Sodium 134 - 144 mmol/L 138 138 140  Potassium 3.5 - 5.2 mmol/L 4.5 4.4 3.8  Chloride 96 - 106 mmol/L 99 101 102  CO2 20 - 29 mmol/L _1 Calcium 8.7 - 10.2 mg/dL 9.1 8.6(L) 8.8(L)  Total Protein 6.0 - 8.5 g/dL 7.1 - -  Total Bilirubin 0.0 - 1.2 mg/dL 1.4(H) - -  Alkaline Phos 39 - 117 IU/L 108 - -  AST 0 - 40 IU/L 25 - -  ALT 0 - 32 IU/L 37(H) - -    Lab Results  Component Value Date   WBC 10.6 (H) 04/25/2018   HGB 10.9 (L) 04/25/2018   HCT 31.7 (L) 04/25/2018   MCV 89.5 04/25/2018   PLT 142 (L) 04/25/2018   NEUTROABS 7.5 04/25/2018    ASSESSMENT & PLAN:  Breast cancer of lower-outer quadrant of right female breast 2014: Right breast invasive ductal carcinoma ER/PR positive HER-2 negative stage II A. T3, N1, M0 with PTEN mutation Priortreatment: Neoadjuvant chemo  with FEC Taxol right lumpectomy, radiation, adjuvant Zoladex and exemestane started 07/03/2013 ------------------------------------------------------------------------------ Breast MRI 04/22/2017: 2.3 cm malignancy lower outer quadrant right breast within the lumpectomy scar  Recommendation: 1.Neoadjuvant chemotherapy with carboplatin and gemcitabine day 1 and 8 every 3 weeks for 6 cycles 2.followed by mastectomy 3.Followed by continuedantiestrogen therapy  PET/CT scan: Negative for distant metastasis -------------------------------------------------------------------------------- 10/05/2017:Right mastectomy: IDC grade 3, 3 cm, with high-grade DCIS, margins negative, 0/2 lymph nodes negative, ER 10%, PR 0%, HER-2 negative, Ki-67 30% ypT2ypN0  11/08/2017: Hysterectomy and bilateral salpingo-oophorectomy: Benign  Current treatment: Exemestane 25 mg daily restarted 923 2019 x 5 years  Exemestane toxicities: Tolerating it extremely well.  Breast cancer surveillance: Patient will need  mammogram of the left breast.:  This will be set up for next month.  Return to clinic in 1 year for follow-up   No orders of the defined types were placed in this encounter.  The patient has a good understanding of the overall plan. she agrees with it. she will call with any problems that may develop before the next visit here.  Nicholas Lose, MD 04/25/2018  Mary Dougherty am acting as scribe for Dr. Nicholas Lose.  I have reviewed the above documentation for accuracy and completeness, and I agree with the above.

## 2018-04-25 ENCOUNTER — Inpatient Hospital Stay: Payer: 59 | Attending: Hematology and Oncology

## 2018-04-25 ENCOUNTER — Inpatient Hospital Stay: Payer: 59 | Admitting: Hematology and Oncology

## 2018-04-25 ENCOUNTER — Other Ambulatory Visit: Payer: Self-pay

## 2018-04-25 DIAGNOSIS — Z9011 Acquired absence of right breast and nipple: Secondary | ICD-10-CM

## 2018-04-25 DIAGNOSIS — Z171 Estrogen receptor negative status [ER-]: Secondary | ICD-10-CM | POA: Diagnosis not present

## 2018-04-25 DIAGNOSIS — Z170 Estrogen receptor positive status [ER+]: Secondary | ICD-10-CM

## 2018-04-25 DIAGNOSIS — Z90722 Acquired absence of ovaries, bilateral: Secondary | ICD-10-CM | POA: Insufficient documentation

## 2018-04-25 DIAGNOSIS — C50511 Malignant neoplasm of lower-outer quadrant of right female breast: Secondary | ICD-10-CM

## 2018-04-25 DIAGNOSIS — Z90710 Acquired absence of both cervix and uterus: Secondary | ICD-10-CM | POA: Diagnosis not present

## 2018-04-25 LAB — CBC WITH DIFFERENTIAL (CANCER CENTER ONLY)
Abs Immature Granulocytes: 0.12 10*3/uL — ABNORMAL HIGH (ref 0.00–0.07)
BASOS PCT: 0 %
Basophils Absolute: 0 10*3/uL (ref 0.0–0.1)
EOS PCT: 2 %
Eosinophils Absolute: 0.2 10*3/uL (ref 0.0–0.5)
HEMATOCRIT: 31.7 % — AB (ref 36.0–46.0)
Hemoglobin: 10.9 g/dL — ABNORMAL LOW (ref 12.0–15.0)
IMMATURE GRANULOCYTES: 1 %
LYMPHS PCT: 22 %
Lymphs Abs: 2.3 10*3/uL (ref 0.7–4.0)
MCH: 30.8 pg (ref 26.0–34.0)
MCHC: 34.4 g/dL (ref 30.0–36.0)
MCV: 89.5 fL (ref 80.0–100.0)
MONO ABS: 0.5 10*3/uL (ref 0.1–1.0)
Monocytes Relative: 4 %
Neutro Abs: 7.5 10*3/uL (ref 1.7–7.7)
Neutrophils Relative %: 71 %
Platelet Count: 142 10*3/uL — ABNORMAL LOW (ref 150–400)
RBC: 3.54 MIL/uL — ABNORMAL LOW (ref 3.87–5.11)
RDW: 18.8 % — AB (ref 11.5–15.5)
WBC: 10.6 10*3/uL — AB (ref 4.0–10.5)
nRBC: 0 % (ref 0.0–0.2)

## 2018-04-25 LAB — CMP (CANCER CENTER ONLY)
ALK PHOS: 108 U/L (ref 38–126)
ALT: 70 U/L — AB (ref 0–44)
AST: 29 U/L (ref 15–41)
Albumin: 3.7 g/dL (ref 3.5–5.0)
Anion gap: 10 (ref 5–15)
BILIRUBIN TOTAL: 2.2 mg/dL — AB (ref 0.3–1.2)
BUN: 6 mg/dL (ref 6–20)
CHLORIDE: 102 mmol/L (ref 98–111)
CO2: 26 mmol/L (ref 22–32)
CREATININE: 0.76 mg/dL (ref 0.44–1.00)
Calcium: 8.7 mg/dL — ABNORMAL LOW (ref 8.9–10.3)
GFR, Est AFR Am: >60 mL/min (ref >60)
Glucose, Bld: 246 mg/dL — ABNORMAL HIGH (ref 70–99)
Potassium: 4.2 mmol/L (ref 3.5–5.1)
Sodium: 138 mmol/L (ref 135–145)
Total Protein: 7.4 g/dL (ref 6.5–8.1)

## 2018-04-25 MED ORDER — EXEMESTANE 25 MG PO TABS: 25.0000 mg | ORAL_TABLET | Freq: Every day | ORAL | 3 refills | Status: DC

## 2018-04-25 NOTE — Assessment & Plan Note (Signed)
2014: Right breast invasive ductal carcinoma ER/PR positive HER-2 negative stage II A. T3, N1, M0 with PTEN mutation Priortreatment: Neoadjuvant chemo with FEC Taxol right lumpectomy, radiation, adjuvant Zoladex and exemestane started 07/03/2013 ------------------------------------------------------------------------------ Breast MRI 04/22/2017: 2.3 cm malignancy lower outer quadrant right breast within the lumpectomy scar  Recommendation: 1.Neoadjuvant chemotherapy with carboplatin and gemcitabine day 1 and 8 every 3 weeks for 6 cycles 2.followed by mastectomy 3.Followed by continuedantiestrogen therapy  PET/CT scan: Negative for distant metastasis -------------------------------------------------------------------------------- 10/05/2017:Right mastectomy: IDC grade 3, 3 cm, with high-grade DCIS, margins negative, 0/2 lymph nodes negative, ER 10%, PR 0%, HER-2 negative, Ki-67 30% ypT2ypN0  11/08/2017: Hysterectomy and bilateral salpingo-oophorectomy: Benign  Current treatment: Exemestane 25 mg daily restarted 923 2019 x 5 years  Exemestane toxicities:  Breast cancer surveillance: Patient will need mammogram of the left breast.  Return to clinic in 1 year for follow-up

## 2018-05-13 ENCOUNTER — Other Ambulatory Visit: Payer: Self-pay | Admitting: *Deleted

## 2018-05-13 MED ORDER — EXEMESTANE 25 MG PO TABS: 25.0000 mg | ORAL_TABLET | Freq: Every day | ORAL | 3 refills | Status: DC

## 2018-06-11 ENCOUNTER — Ambulatory Visit: Payer: 59 | Admitting: Family Medicine

## 2018-06-11 IMAGING — US US ABDOMEN COMPLETE
1 series · 14 of 25 positions shown · non-contrast
Comparison: PET CT scan 05/04/2016.

CLINICAL DATA: Elevated liver function tests.

EXAM:
ABDOMEN ULTRASOUND COMPLETE

[Series 1: us abdomen complete · 0.20mm/px · 14 of 117 slices shown]
[im 1/117]
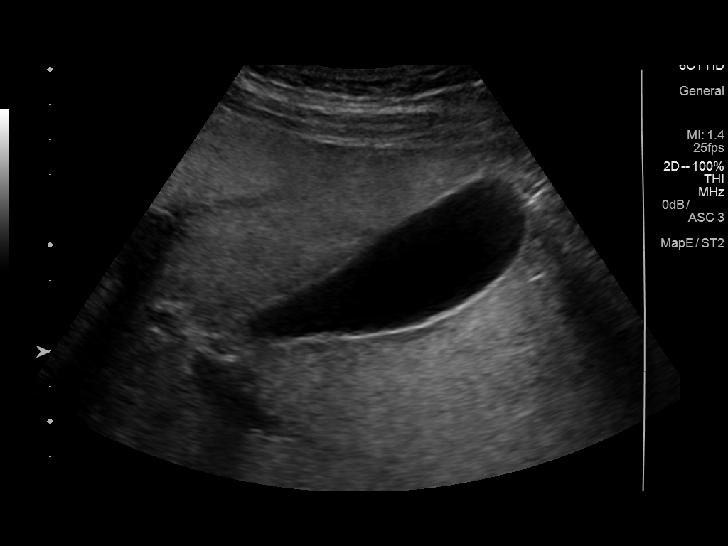
[im 10/117]
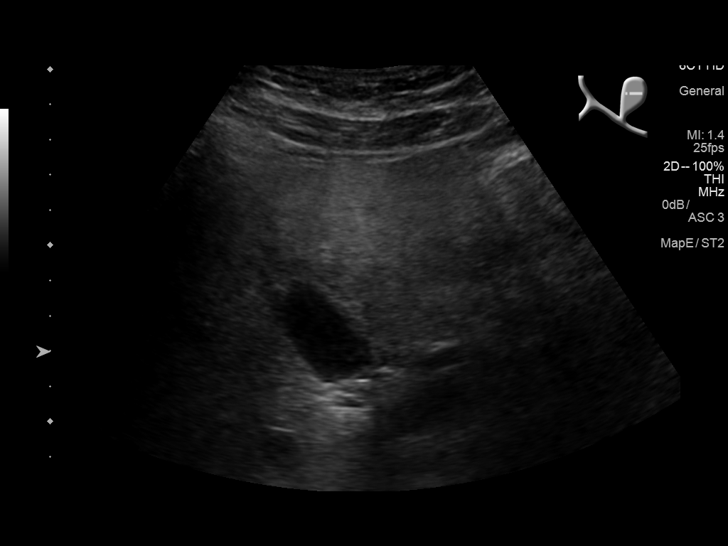
[im 20/117]
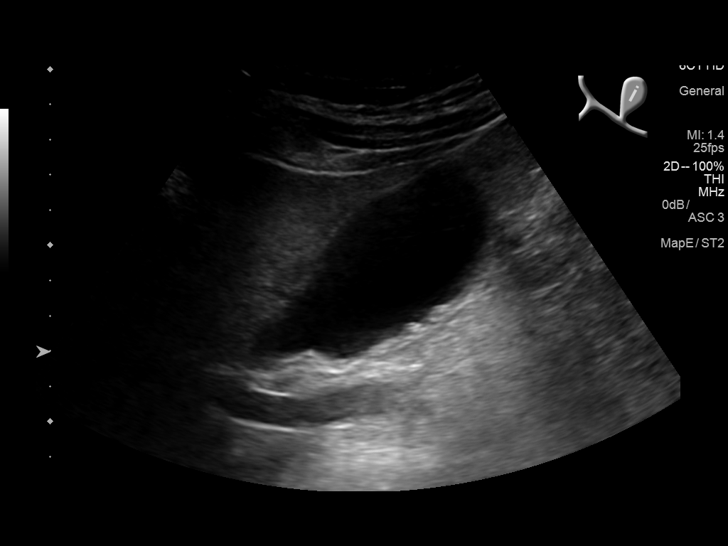
[im 30/117]
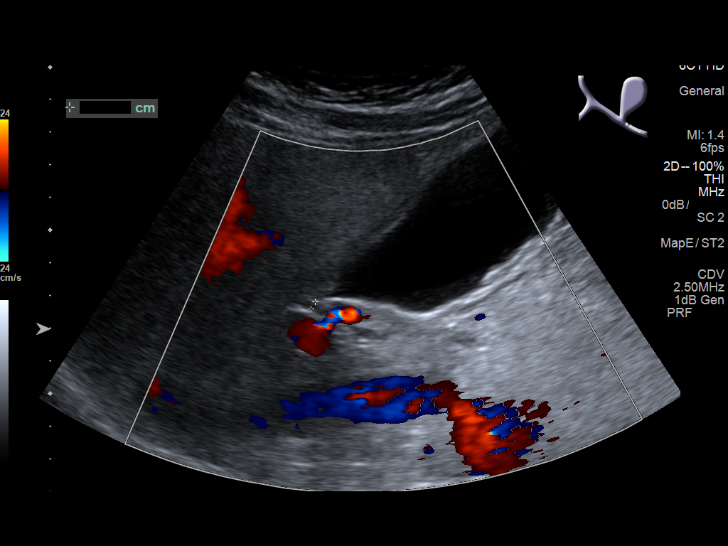
[im 39/117]
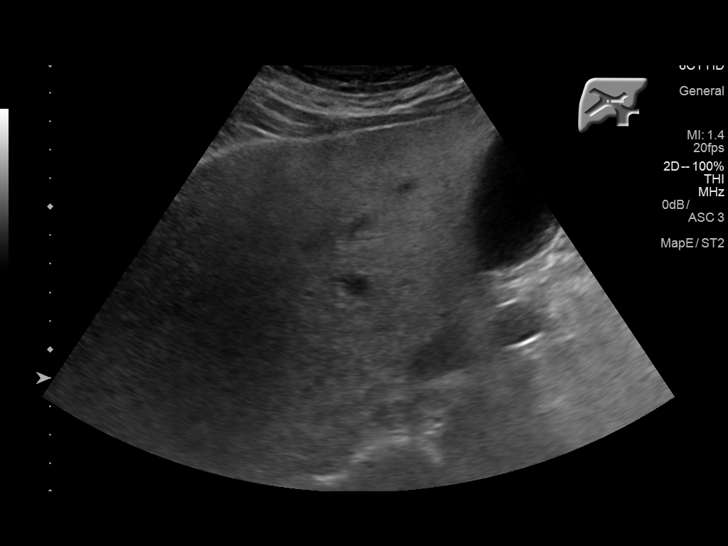
[im 44/117]
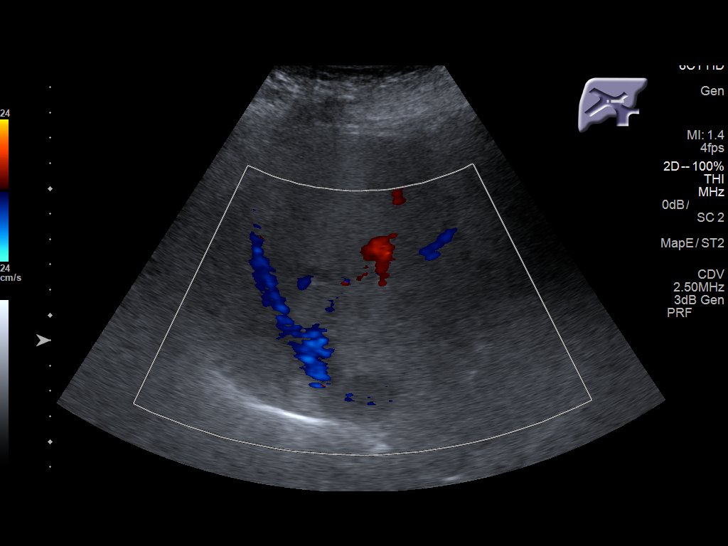
[im 54/117]
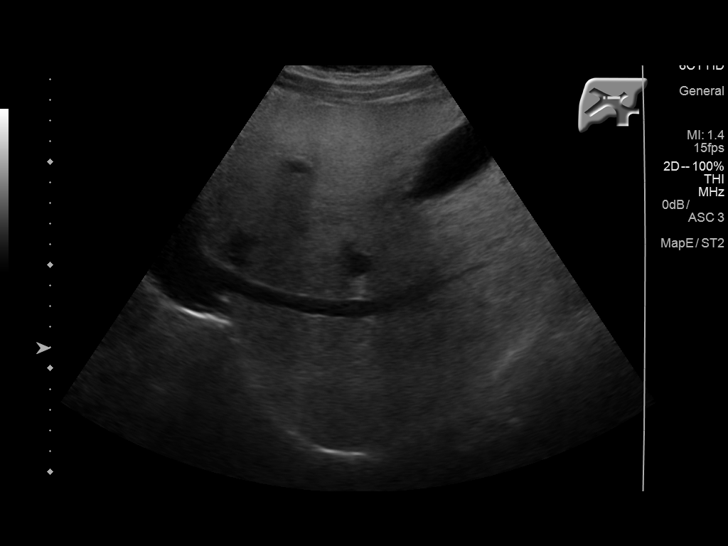
[im 63/117]
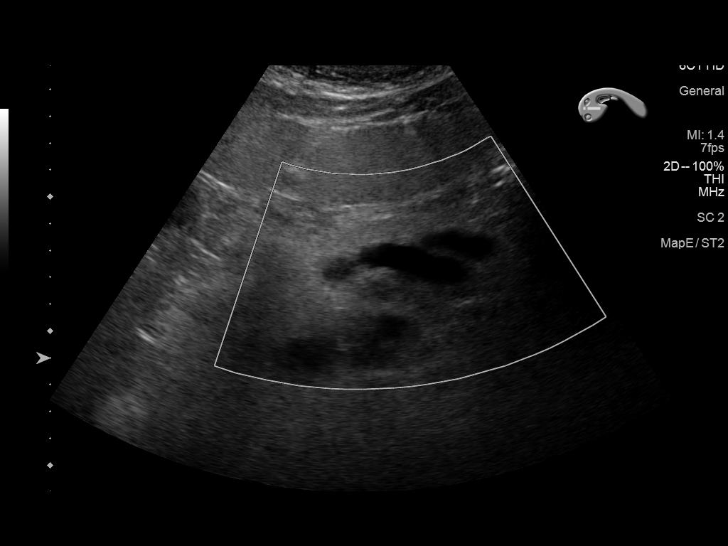
[im 73/117]
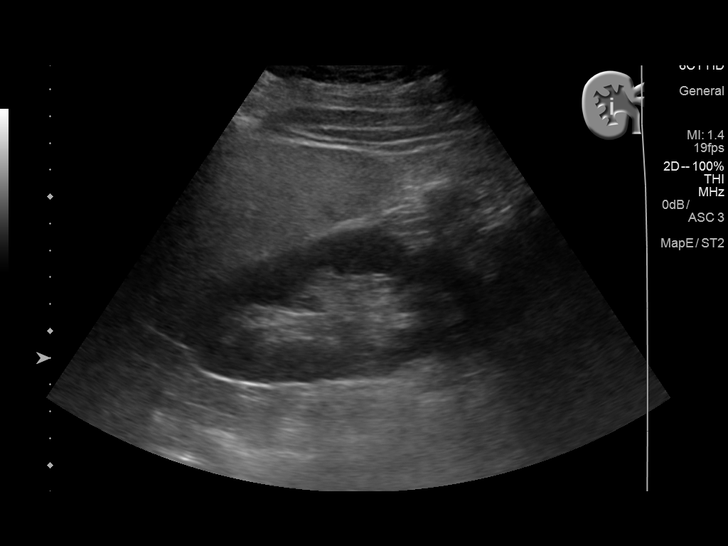
[im 78/117]
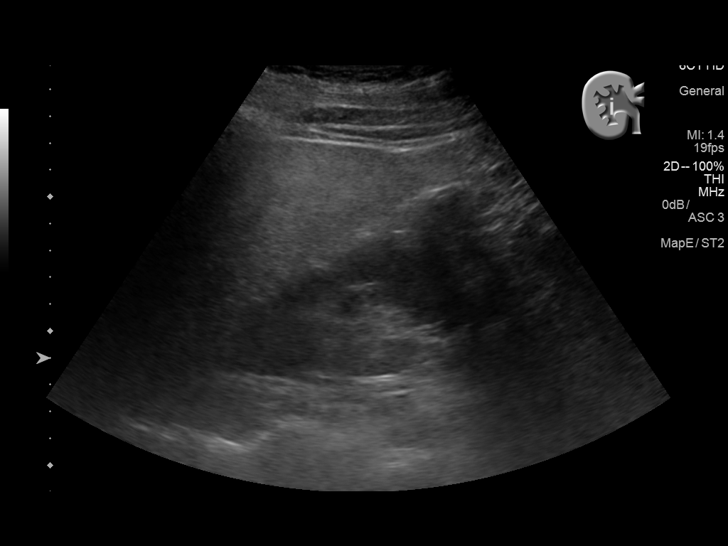
[im 88/117]
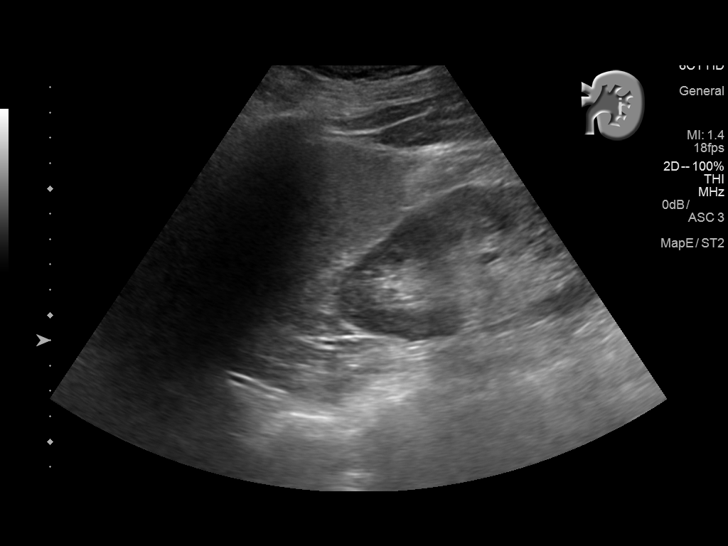
[im 97/117]
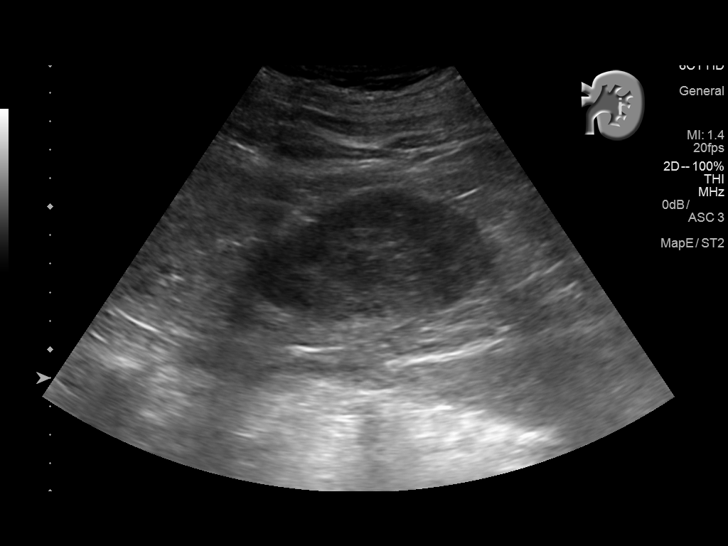
[im 107/117]
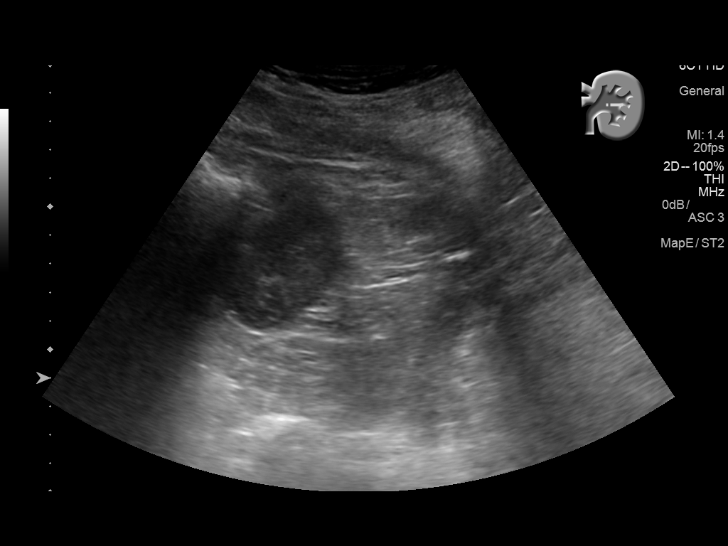
[im 117/117]
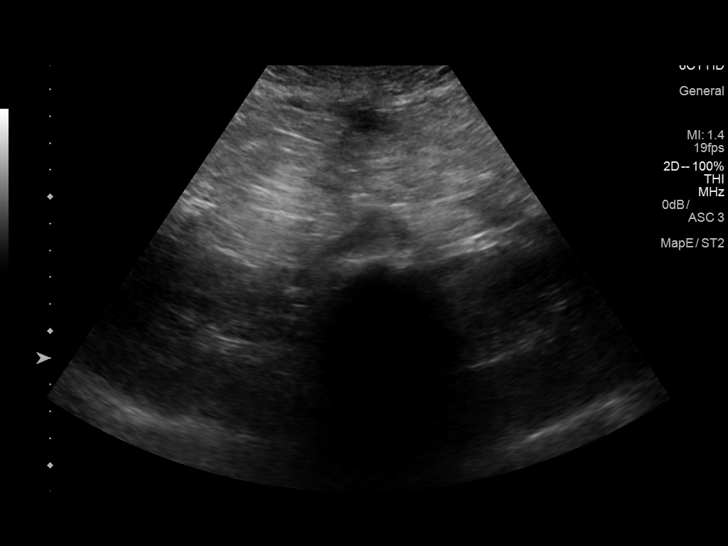

[14 of 25 positions shown; findings below may reference images not displayed]

FINDINGS: Gallbladder: A few tiny stones are seen layering dependently. No
wall thickening or pericholecystic fluid. Sonographer reports
negative Murphy's sign.

Common bile duct: Diameter: 0.3 cm

Liver: Echogenicity is diffusely increased consistent with fatty
infiltration. No focal lesion. Portal vein is patent on color
Doppler imaging with normal direction of blood flow towards the
liver.

IVC: No abnormality visualized.

Pancreas: Visualized portion unremarkable.

Spleen: No focal lesion. Splenic volume is 802 cc. Upper limits of
normal is 411 cc.

Right Kidney: Length: 12.0 cm. Echogenicity within normal limits. No
mass or hydronephrosis visualized.

Left Kidney: Length: 12.2 cm. Echogenicity within normal limits.
cm cyst upper pole incidentally noted. No hydronephrosis visualized.

Abdominal aorta: No aneurysm visualized.

Other findings: None.
IMPRESSION: No acute abnormality.

Fatty infiltration of the liver.

A few tiny gallstones are present.  No cholecystitis.

## 2018-06-18 ENCOUNTER — Ambulatory Visit: Payer: 59 | Admitting: Family Medicine

## 2018-06-25 ENCOUNTER — Ambulatory Visit: Payer: 59 | Admitting: Family Medicine

## 2018-11-16 ENCOUNTER — Encounter: Payer: Self-pay | Admitting: Hematology and Oncology

## 2018-11-16 ENCOUNTER — Telehealth: Payer: Self-pay

## 2018-11-16 NOTE — Telephone Encounter (Signed)
Form faxed to [facility] for dx mammogram

## 2018-12-11 ENCOUNTER — Other Ambulatory Visit: Payer: Self-pay

## 2018-12-11 ENCOUNTER — Encounter (HOSPITAL_COMMUNITY): Payer: Self-pay | Admitting: *Deleted

## 2018-12-11 ENCOUNTER — Emergency Department (HOSPITAL_COMMUNITY)
Admission: EM | Admit: 2018-12-11 | Discharge: 2018-12-11 | Disposition: A | Discharge status: Home / Self Care | Payer: 59 | Source: Home / Self Care

## 2018-12-11 ENCOUNTER — Emergency Department (HOSPITAL_COMMUNITY): Payer: 59

## 2018-12-11 DIAGNOSIS — R079 Chest pain, unspecified: Secondary | ICD-10-CM | POA: Insufficient documentation

## 2018-12-11 DIAGNOSIS — K859 Acute pancreatitis without necrosis or infection, unspecified: Secondary | ICD-10-CM | POA: Diagnosis not present

## 2018-12-11 DIAGNOSIS — K851 Biliary acute pancreatitis without necrosis or infection: Secondary | ICD-10-CM | POA: Diagnosis not present

## 2018-12-11 DIAGNOSIS — Z5321 Procedure and treatment not carried out due to patient leaving prior to being seen by health care provider: Secondary | ICD-10-CM | POA: Insufficient documentation

## 2018-12-11 LAB — CBC
HCT: 31.5 % — ABNORMAL LOW (ref 36.0–46.0)
Hemoglobin: 11.1 g/dL — ABNORMAL LOW (ref 12.0–15.0)
MCH: 32.5 pg (ref 26.0–34.0)
MCHC: 35.2 g/dL (ref 30.0–36.0)
MCV: 92.1 fL (ref 80.0–100.0)
Platelets: 174 10*3/uL (ref 150–400)
RBC: 3.42 MIL/uL — ABNORMAL LOW (ref 3.87–5.11)
RDW: 18.1 % — ABNORMAL HIGH (ref 11.5–15.5)
WBC: 12.9 10*3/uL — ABNORMAL HIGH (ref 4.0–10.5)
nRBC: 0 % (ref 0.0–0.2)

## 2018-12-11 LAB — BASIC METABOLIC PANEL
Anion gap: 13 (ref 5–15)
BUN: 12 mg/dL (ref 6–20)
CO2: 24 mmol/L (ref 22–32)
Calcium: 9 mg/dL (ref 8.9–10.3)
Chloride: 102 mmol/L (ref 98–111)
Creatinine, Ser: 0.93 mg/dL (ref 0.44–1.00)
GFR calc Af Amer: >60 mL/min (ref >60)
GFR calc non Af Amer: >60 mL/min (ref >60)
Glucose, Bld: 205 mg/dL — ABNORMAL HIGH (ref 70–99)
Potassium: 4 mmol/L (ref 3.5–5.1)
Sodium: 139 mmol/L (ref 135–145)

## 2018-12-11 LAB — LIPASE, BLOOD: Lipase: 28 U/L (ref 11–51)

## 2018-12-11 LAB — I-STAT BETA HCG BLOOD, ED (MC, WL, AP ONLY): I-stat hCG, quantitative: <5 m[IU]/mL (ref <5)

## 2018-12-11 LAB — TROPONIN I (HIGH SENSITIVITY): Troponin I (High Sensitivity): 2 ng/L (ref <18)

## 2018-12-11 MED ORDER — SODIUM CHLORIDE 0.9% FLUSH: 3.0000 mL | Freq: Once | INTRAVENOUS | Status: DC

## 2018-12-11 NOTE — ED Notes (Signed)
Mary Dougherty (Sister# [phone]).

## 2018-12-11 NOTE — ED Triage Notes (Signed)
Pt reporting onset of upper abdominal that radiates her back. Pt said on initial pain, she felt very hot like she was going to pass out. Pt took ASA and tums, within minimal relief.

## 2018-12-11 NOTE — ED Notes (Signed)
Pt has left and tech advised pt to not do so. Tech notified sort nurse know.

## 2018-12-11 NOTE — ED Notes (Signed)
Mary Dougherty (Sister# [phone]).

## 2018-12-12 ENCOUNTER — Encounter: Payer: Self-pay | Admitting: Family Medicine

## 2018-12-12 ENCOUNTER — Ambulatory Visit: Payer: 59 | Admitting: Family Medicine

## 2018-12-12 VITALS — BP 114/72 | HR 82 | Temp 98.1°F | Resp 12 | Ht 65.0 in | Wt 152.0 lb

## 2018-12-12 DIAGNOSIS — R0789 Other chest pain: Secondary | ICD-10-CM

## 2018-12-12 DIAGNOSIS — R17 Unspecified jaundice: Secondary | ICD-10-CM | POA: Diagnosis not present

## 2018-12-12 DIAGNOSIS — I208 Other forms of angina pectoris: Secondary | ICD-10-CM

## 2018-12-12 DIAGNOSIS — R1013 Epigastric pain: Secondary | ICD-10-CM

## 2018-12-12 MED ORDER — OMEPRAZOLE 20 MG PO CPDR: 20.0000 mg | DELAYED_RELEASE_CAPSULE | Freq: Every day | ORAL | 3 refills | Status: DC

## 2018-12-12 NOTE — Patient Instructions (Signed)
Angina  Angina is extreme discomfort in the chest, neck, arm, jaw, or back. The discomfort is caused by a lack of blood in the middle layer of the heart wall (myocardium). There are four types of angina:  Stable angina. This is triggered by vigorous activity or exercise. It goes away when you rest or take angina medicine.  Unstable angina. This is a warning sign and can lead to a heart attack (acute coronary syndrome). This is a medical emergency. Symptoms come at rest and last a long time.  Microvascular angina. This affects the small coronary arteries. Symptoms include feeling tired and being short of breath.  Prinzmetal or variant angina. This is caused by a tightening (spasm) of the arteries that go to your heart. What are the causes? This condition is caused by atherosclerosis. This is the buildup of fat and cholesterol (plaque) in your arteries. The plaque may narrow or block the artery. Other causes of angina include:  Sudden tightening of the muscles of the arteries in the heart (coronary spasm).  Small artery disease (microvascular dysfunction).  Problems with any of your heart valves (heart valve disease).  A tear in an artery in your heart (coronary artery dissection).  Diseases of the heart muscle (cardiomyopathy), or other heart diseases. What increases the risk? You are more likely to develop this condition if you have:  High cholesterol.  High blood pressure (hypertension).  Diabetes.  A family history of heart disease.  An inactive (sedentary) lifestyle, or you do not exercise enough.  Depression.  Had radiation treatment to the left side of your chest. Other risk factors include:  Using tobacco.  Being obese.  Eating a diet high in saturated fats.  Being exposed to high stress or triggers of stress.  Using drugs, such as cocaine. Women have a greater risk for angina if:  They are older than 55.  They have gone through menopause (are  postmenopausal). What are the signs or symptoms? Common symptoms of this condition in both men and women may include:  Chest pain, which may: ? Feel like a crushing or squeezing in the chest, or like a tightness, pressure, fullness, or heaviness in the chest. ? Last for more than a few minutes at a time, or it may stop and come back (recur) over the course of a few minutes.  Pain in the neck, arm, jaw, or back.  Unexplained heartburn or indigestion.  Shortness of breath.  Nausea.  Sudden cold sweats. Women and people with diabetes may have unusual (atypical) symptoms, such as:  Fatigue.  Unexplained feelings of nervousness or anxiety.  Unexplained weakness.  Dizziness or fainting. How is this diagnosed? This condition may be diagnosed based on:  Your symptoms and medical history.  Electrocardiogram (ECG) to measure the electrical activity in your heart.  Blood tests.  Stress test to look for signs of blockage when your heart is stressed.  CT angiogram to examine your heart and the blood flow to it.  Coronary angiogram to check your coronary arteries for blockage. How is this treated? Angina may be treated with:  Medicines to: ? Prevent blood clots and heart attack. ? Relax blood vessels and improve blood flow to the heart (nitrates). ? Reduce blood pressure, improve the pumping action of the heart, and relax blood vessels that are spasming. ? Reduce cholesterol and help treat atherosclerosis.  A procedure to widen a narrowed or blocked coronary artery (angioplasty). A mesh tube may be placed in a coronary artery to   keep it open (coronary stenting).  Surgery to allow blood to go around a blocked artery (coronary artery bypass surgery). Follow these instructions at home: Medicines  Take over-the-counter and prescription medicines only as told by your health care provider.  Do not take the following medicines unless your health care provider approves: ? NSAIDs,  such as ibuprofen or naproxen. ? Vitamin supplements that contain vitamin A, vitamin E, or both. ? Hormone replacement therapy that contains estrogen with or without progestin. Eating and drinking   Eat a heart-healthy diet. This includes plenty of fresh fruits and vegetables, whole grains, low-fat (lean) protein, and low-fat dairy products.  Follow instructions from your health care provider about eating or drinking restrictions. Activity  Follow an exercise program approved by your health care provider.  Consider joining a cardiac rehabilitation program.  Take a break when you feel fatigued. Plan rest periods in your daily activities. Lifestyle   Do not use any products that contain nicotine or tobacco, such as cigarettes, e-cigarettes, and chewing tobacco. If you need help quitting, ask your health care provider.  If your health care provider says you can drink alcohol: ? Limit how much you use to:  0-1 drink a day for nonpregnant women.  0-2 drinks a day for men. ? Be aware of how much alcohol is in your drink. In the [place] one drink equals one 12 oz bottle of beer (355 mL), one 5 oz glass of wine (148 mL), or one 1 oz glass of hard liquor (44 mL). General instructions  Maintain a healthy weight.  Learn to manage stress.  Keep your vaccinations up to date. Get the flu (influenza) vaccine every year.  Talk to your health care provider if you feel depressed. Take a depression screening test to see if you are at risk for depression.  Work with your health care provider to manage other health conditions, such as hypertension or diabetes.  Keep all follow-up visits as told by your health care provider. This is important. Get help right away if:  You have pain in your chest, neck, arm, jaw, or back, and the pain: ? Lasts more than a few minutes. ? Is recurring. ? Is not relieved by taking medicines under the tongue (sublingual nitroglycerin). ? Increases in intensity or  frequency.  You have a lot of sweating without cause.  You have unexplained: ? Heartburn or indigestion. ? Shortness of breath or difficulty breathing. ? Nausea or vomiting. ? Fatigue. ? Feelings of nervousness or anxiety. ? Weakness.  You have sudden light-headedness or dizziness.  You faint. These symptoms may represent a serious problem that is an emergency. Do not wait to see if the symptoms will go away. Get medical help right away. Call your local emergency services (911 in the [place] Do not drive yourself to the hospital. Summary  Angina is extreme discomfort in the chest, neck, arm, jaw, or back that is caused by a lack of blood in the heart wall.  There are many symptoms of angina. They include chest pain, unexplained heartburn or indigestion, sudden cold sweats, and fatigue.  Angina may be treated with behavioral changes, medicine, or surgery.  Symptoms of angina may represent an emergency. Get medical help right away. Call your local emergency services (911 in the [place] Do not drive yourself to the hospital. This information is not intended to replace advice given to you by your health care provider. Make sure you discuss any questions you have with your health care provider.   Document Released: 01/19/2005 Document Revised: 09/06/2017 Document Reviewed: 09/06/2017 °Elsevier Patient Education © 2020 [employer] ° °

## 2018-12-12 NOTE — Progress Notes (Signed)
Established Patient Office Visit  Subjective:  Patient ID: Mary Dougherty, female    DOB: November 21, 1970  Age: 48 y.o. MRN: [id number]  CC:  Chief Complaint  Patient presents with  . Chest Pain    Pt stated went to ER yesterday....but still having sore upper abdominal especially when sneezing, coughing--started yesterday. EKG done by ER    HPI Mary Dougherty presents for   Angina Patient reports that she has severe pain that radiates from her epigastrium to her back in a straight She repots that she has some workup done in the ER with ECG and labs and she reports that she was told to go back out to wait with the sick people in the lobby so she left She reports that she could not stay because the ER was so crowded. She took aspirin and TUMS She reports that this morning after she slept she took tums to help with the burning radiating pain She states that with the onset of symptoms she broke out into a huge sweat She denies nausea or vomiting  Jaundice She denies any notice of being jaundiced She reports that she has a history of anemia and also elevated bilirubin  Breast Cancer She is s/p chemo x 2 then mastectomy on the right side for breast cancer Her last mammogram was good She is now on aromasin  She continues her metformin   Past Medical History:  Diagnosis Date  . Anemia   . Blood transfusion without reported diagnosis   . Chronic anemia 03/03/13  . Dyspnea    with exertion, during chemo  . Family history of adverse reaction to anesthesia     father- hard to awaken  . Hereditary spherocytosis ([place])   . Invasive ductal carcinoma of right breast ([place]) 09/2012   ER(99%), PR(100%), Ki-67(70%), 1/5 nodes positive  . Pre-diabetes    off metformin now  . Recurrent breast cancer, right ([place]) 09/2017  . S/P radiation therapy 05/08/2013-06/22/2013   1) Right breast / 50.4 Gy in 28 fractions / 2) Right Supraclavicular fossa/ 50.4 Gy in 28 fractions /3) Right Posterior  Axillary boost / 11.9 Gy in 28 fractions /4) Right breast boost / 10 Gy in 5 fractions   . Spherocytosis ([place]) 1989  . Status post chemotherapy 10/07/12 - 12/16/12    Neoadjuvant chemotherapy with dose dense FEC (5-FU/epirubicin/Cytoxan) from 10/07/2012 through 12/16/12.    [place] Kitchen Syncope and collapse 1989   "thats when I found out I have Spherocytosis"  . Thyroid disease   . Use of exemestane (Aromasin) 07/25/13  . Use of goserelin acetate (Zoladex) 07/25/13  . Wears glasses     Past Surgical History:  Procedure Laterality Date  . BREAST LUMPECTOMY WITH SENTINEL LYMPH NODE BIOPSY Right 04/06/2013   [name]  . BREAST SURGERY     left breast lumpectomy/left ax snbx  . COLONOSCOPY  2015  . MASTECTOMY W/ SENTINEL NODE BIOPSY Right 10/05/2017   Procedure: RIGHT MASTECTOMY WITH RIGHT SENTINEL LYMPH NODE BIOPSY;  Surgeon: [name], MD;  Location: [facility];  Service: General;  Laterality: Right;  . PORT-A-CATH REMOVAL N/A 04/06/2013   Procedure: REMOVAL PORT-A-CATH;  Surgeon: [name], MD;  Location: [facility];  Service: General;  Laterality: N/A;  . PORT-A-CATH REMOVAL N/A 11/08/2017   Procedure: REMOVAL PORT-A-CATH;  Surgeon: Greer Pickerel, MD;  Location: [facility];  Service: General;  Laterality: N/A;  . PORTACATH PLACEMENT Left 09/20/2012   Procedure: INSERTION PORT-A-CATH;  Surgeon:  [name], MD;  Location: [facility];  Service: General;  Laterality: Left;  . PORTACATH PLACEMENT Right 05/18/2017   Procedure: INSERTION PORT-A-CATH WITH ULTRASOUND;  Surgeon: [name], MD;  Location: [facility];  Service: General;  Laterality: Right;  . ROBOTIC ASSISTED LAPAROSCOPIC LYSIS OF ADHESION  11/08/2017   Procedure: XI ROBOTIC ASSISTED LAPAROSCOPIC LYSIS OF BOWEL ADHESIONS;  Surgeon: Brien Few, MD;  Location: [facility];  Service: Gynecology;;  . ROBOTIC ASSISTED TOTAL HYSTERECTOMY WITH BILATERAL SALPINGO OOPHERECTOMY Bilateral  11/08/2017   Procedure: XI ROBOTIC ASSISTED TOTAL HYSTERECTOMY WITH BILATERAL SALPINGO OOPHORECTOMY;  Surgeon: Brien Few, MD;  Location: [facility];  Service: Gynecology;  Laterality: Bilateral;  [place] for 23 hr obs.  . WISDOM TOOTH EXTRACTION      Family History  Problem Relation Age of Onset  . Pulmonary embolism Maternal Uncle 10       died instantly  . Melanoma Cousin        paternal cousin dx in her 26s  . Heart disease Father   . Diabetes Father   . Thyroid disease Mother     Social History   Socioeconomic History  . Marital status: Single    Spouse name: Not on file  . Number of children: Not on file  . Years of education: Not on file  . Highest education level: Not on file  Occupational History  . Not on file  Social Needs  . Financial resource strain: Not on file  . Food insecurity    Worry: Not on file    Inability: Not on file  . Transportation needs    Medical: Not on file    Non-medical: Not on file  Tobacco Use  . Smoking status: Never Smoker  . Smokeless tobacco: Never Used  Substance and Sexual Activity  . Alcohol use: No  . Drug use: No  . Sexual activity: Not Currently    Birth control/protection: Post-menopausal    Comment: menarche age 48, P55,  last menstrual cycle 08/26/2012  Lifestyle  . Physical activity    Days per week: Not on file    Minutes per session: Not on file  . Stress: Not on file  Relationships  . Social Herbalist on phone: Not on file    Gets together: Not on file    Attends religious service: Not on file    Active member of club or organization: Not on file    Attends meetings of clubs or organizations: Not on file    Relationship status: Not on file  . Intimate partner violence    Fear of current or ex partner: Not on file    Emotionally abused: Not on file    Physically abused: Not on file    Forced sexual activity: Not on file  Other Topics Concern  . Not on file  Social History Narrative  . Not on file     Outpatient Medications Prior to Visit  Medication Sig Dispense Refill  . exemestane (AROMASIN) 25 MG tablet Take 1 tablet (25 mg total) by mouth daily with lunch. 90 tablet 3  . folic acid (FOLVITE) 818 MCG tablet Take 800 mcg by mouth daily at 12 noon.     . metFORMIN (GLUCOPHAGE) 500 MG tablet Take 1 tablet (500 mg total) by mouth 2 (two) times daily with a meal. 180 tablet 1   No facility-administered medications prior to visit.     No Known Allergies  ROS Review of Systems Review  of Systems  Constitutional: Negative for activity change, appetite change, chills and fever.  HENT: Negative for congestion, nosebleeds, trouble swallowing and voice change.   Respiratory: Negative for cough, shortness of breath and wheezing.   Gastrointestinal: Negative for diarrhea, nausea and vomiting.  Genitourinary: Negative for difficulty urinating, dysuria, flank pain and hematuria.  Musculoskeletal: Negative for back pain, joint swelling and neck pain.  Neurological: Negative for dizziness, speech difficulty, light-headedness and numbness.  See HPI. All other review of systems negative.     Objective:    Physical Exam  BP 114/72 (BP Location: Right Arm, Patient Position: Sitting, Cuff Size: Normal)   Pulse 82   Temp 98.1 F (36.7 C)   Resp 12   Ht '5\' 5"'$  (1.651 m)   Wt 152 lb (68.9 kg)   LMP 10/28/2012   SpO2 96%   BMI 25.29 kg/m  Wt Readings from Last 3 Encounters:  12/12/18 152 lb (68.9 kg)  12/11/18 180 lb (81.6 kg)  04/25/18 192 lb 6.4 oz (87.3 kg)   Physical Exam  Constitutional: Oriented to person, place, and time. Appears well-developed and well-nourished.  HENT:  Head: Normocephalic and atraumatic.  Eyes: SCLERAL ICTERUS and EOM are normal.  Cardiovascular: Normal rate, regular rhythm, normal heart sounds and intact distal pulses.  No murmur heard. Pulmonary/Chest: Effort normal and breath sounds normal. No stridor. No respiratory distress. Has no wheezes.   Abdomen: nondistended, normoactive bs, soft, nontender Neurological: Is alert and oriented to person, place, and time.  Skin: Skin is warm. Capillary refill takes less than 2 seconds. JAUNDICED TO THE ABDOMEN Psychiatric: Has a normal mood and affect. Behavior is normal. Judgment and thought content normal.   ECG- nsr, no twi, no st elevation unchanged compared to 12/11/2018  Health Maintenance Due  Topic Date Due  . OPHTHALMOLOGY EXAM  04/30/1978  . PAP SMEAR-Modifier  04/29/1989  . HEMOGLOBIN A1C  09/24/2018    There are no preventive care reminders to display for this patient.  Lab Results  Component Value Date   TSH 3.303 01/25/2015   Lab Results  Component Value Date   WBC 12.9 (H) 12/11/2018   HGB 11.1 (L) 12/11/2018   HCT 31.5 (L) 12/11/2018   MCV 92.1 12/11/2018   PLT 174 12/11/2018   Lab Results  Component Value Date   NA 139 12/11/2018   K 4.0 12/11/2018   CHLORIDE 102 03/08/2015   CO2 24 12/11/2018   GLUCOSE 205 (H) 12/11/2018   BUN 12 12/11/2018   CREATININE 0.93 12/11/2018   BILITOT 2.2 (H) 04/25/2018   ALKPHOS 108 04/25/2018   AST 29 04/25/2018   ALT 70 (H) 04/25/2018   PROT 7.4 04/25/2018   ALBUMIN 3.7 04/25/2018   CALCIUM 9.0 12/11/2018   ANIONGAP 13 12/11/2018   EGFR >90 03/08/2015   Lab Results  Component Value Date   CHOL 114 03/26/2018   Lab Results  Component Value Date   HDL 34 (L) 03/26/2018   Lab Results  Component Value Date   LDLCALC 60 03/26/2018   Lab Results  Component Value Date   TRIG 98 03/26/2018   Lab Results  Component Value Date   CHOLHDL 3.4 03/26/2018   Lab Results  Component Value Date   HGBA1C 7.8 (A) 03/26/2018      Assessment & Plan:   Problem List Items Addressed This Visit    None    Visit Diagnoses    Chest pain, radiating    -  Primary Pt left  ER before second troponin Vitals were good in the ER ecg REPEATED today was unchanged Advised pt to get cardiology follow up for her angina Will  treat her GERD with PPI    Relevant Orders   EKG 12-Lead (Completed)   CMP14+EGFR   Epigastric pain    -  Discussed epigastric pain and that it could be cardiac or GI  Since pt left the ER before full workup will refer to cardiology for continued evaluation   Relevant Orders   EKG 12-Lead (Completed)   CMP14+EGFR   Jaundice    - will assess bilirubin  Pt with history of jaundice   Relevant Orders   CMP14+EGFR      No orders of the defined types were placed in this encounter.   Follow-up: No follow-ups on file.    Forrest Moron, MD

## 2018-12-13 ENCOUNTER — Inpatient Hospital Stay (HOSPITAL_COMMUNITY)
Admission: EM | Admit: 2018-12-13 | Discharge: 2018-12-17 | DRG: 418 | Disposition: A | Discharge status: Home / Self Care | Payer: 59 | Attending: Internal Medicine | Admitting: Internal Medicine

## 2018-12-13 ENCOUNTER — Inpatient Hospital Stay (HOSPITAL_COMMUNITY): Payer: 59

## 2018-12-13 ENCOUNTER — Encounter (HOSPITAL_COMMUNITY): Payer: Self-pay | Admitting: Emergency Medicine

## 2018-12-13 ENCOUNTER — Other Ambulatory Visit: Payer: Self-pay

## 2018-12-13 ENCOUNTER — Emergency Department (HOSPITAL_COMMUNITY): Payer: 59

## 2018-12-13 DIAGNOSIS — Z20828 Contact with and (suspected) exposure to other viral communicable diseases: Secondary | ICD-10-CM | POA: Diagnosis present

## 2018-12-13 DIAGNOSIS — R109 Unspecified abdominal pain: Secondary | ICD-10-CM

## 2018-12-13 DIAGNOSIS — Z8249 Family history of ischemic heart disease and other diseases of the circulatory system: Secondary | ICD-10-CM

## 2018-12-13 DIAGNOSIS — K8064 Calculus of gallbladder and bile duct with chronic cholecystitis without obstruction: Secondary | ICD-10-CM | POA: Diagnosis present

## 2018-12-13 DIAGNOSIS — R935 Abnormal findings on diagnostic imaging of other abdominal regions, including retroperitoneum: Secondary | ICD-10-CM

## 2018-12-13 DIAGNOSIS — Z923 Personal history of irradiation: Secondary | ICD-10-CM

## 2018-12-13 DIAGNOSIS — R1013 Epigastric pain: Secondary | ICD-10-CM | POA: Diagnosis not present

## 2018-12-13 DIAGNOSIS — Z9221 Personal history of antineoplastic chemotherapy: Secondary | ICD-10-CM | POA: Diagnosis not present

## 2018-12-13 DIAGNOSIS — K219 Gastro-esophageal reflux disease without esophagitis: Secondary | ICD-10-CM

## 2018-12-13 DIAGNOSIS — Z9010 Acquired absence of unspecified breast and nipple: Secondary | ICD-10-CM

## 2018-12-13 DIAGNOSIS — K851 Biliary acute pancreatitis without necrosis or infection: Secondary | ICD-10-CM | POA: Diagnosis present

## 2018-12-13 DIAGNOSIS — R932 Abnormal findings on diagnostic imaging of liver and biliary tract: Secondary | ICD-10-CM | POA: Diagnosis not present

## 2018-12-13 DIAGNOSIS — E079 Disorder of thyroid, unspecified: Secondary | ICD-10-CM | POA: Diagnosis present

## 2018-12-13 DIAGNOSIS — Z419 Encounter for procedure for purposes other than remedying health state, unspecified: Secondary | ICD-10-CM

## 2018-12-13 DIAGNOSIS — Z8349 Family history of other endocrine, nutritional and metabolic diseases: Secondary | ICD-10-CM | POA: Diagnosis not present

## 2018-12-13 DIAGNOSIS — K859 Acute pancreatitis without necrosis or infection, unspecified: Secondary | ICD-10-CM | POA: Diagnosis present

## 2018-12-13 DIAGNOSIS — D72829 Elevated white blood cell count, unspecified: Secondary | ICD-10-CM

## 2018-12-13 DIAGNOSIS — Z853 Personal history of malignant neoplasm of breast: Secondary | ICD-10-CM

## 2018-12-13 DIAGNOSIS — K8021 Calculus of gallbladder without cholecystitis with obstruction: Secondary | ICD-10-CM | POA: Diagnosis not present

## 2018-12-13 DIAGNOSIS — D580 Hereditary spherocytosis: Secondary | ICD-10-CM | POA: Diagnosis present

## 2018-12-13 DIAGNOSIS — E118 Type 2 diabetes mellitus with unspecified complications: Secondary | ICD-10-CM

## 2018-12-13 DIAGNOSIS — Z808 Family history of malignant neoplasm of other organs or systems: Secondary | ICD-10-CM | POA: Diagnosis not present

## 2018-12-13 DIAGNOSIS — Z833 Family history of diabetes mellitus: Secondary | ICD-10-CM | POA: Diagnosis not present

## 2018-12-13 DIAGNOSIS — Z7984 Long term (current) use of oral hypoglycemic drugs: Secondary | ICD-10-CM

## 2018-12-13 DIAGNOSIS — R1010 Upper abdominal pain, unspecified: Secondary | ICD-10-CM

## 2018-12-13 DIAGNOSIS — R7401 Elevation of levels of liver transaminase levels: Secondary | ICD-10-CM | POA: Diagnosis present

## 2018-12-13 DIAGNOSIS — D649 Anemia, unspecified: Secondary | ICD-10-CM

## 2018-12-13 LAB — URINALYSIS, ROUTINE W REFLEX MICROSCOPIC
Glucose, UA: NEGATIVE mg/dL
Hgb urine dipstick: NEGATIVE
Ketones, ur: NEGATIVE mg/dL
Nitrite: NEGATIVE
Protein, ur: NEGATIVE mg/dL
Specific Gravity, Urine: 1.019 (ref 1.005–1.030)
pH: 5 (ref 5.0–8.0)

## 2018-12-13 LAB — CMP14+EGFR
ALT: 237 IU/L — ABNORMAL HIGH (ref 0–32)
AST: 114 IU/L — ABNORMAL HIGH (ref 0–40)
Albumin/Globulin Ratio: 1.7 (ref 1.2–2.2)
Albumin: 4.6 g/dL (ref 3.8–4.8)
Alkaline Phosphatase: 161 IU/L — ABNORMAL HIGH (ref 39–117)
BUN/Creatinine Ratio: 13 (ref 9–23)
BUN: 9 mg/dL (ref 6–24)
Bilirubin Total: 9.1 mg/dL — ABNORMAL HIGH (ref 0.0–1.2)
CO2: 22 mmol/L (ref 20–29)
Calcium: 9.2 mg/dL (ref 8.7–10.2)
Chloride: 102 mmol/L (ref 96–106)
Creatinine, Ser: 0.71 mg/dL (ref 0.57–1.00)
GFR calc Af Amer: 115 mL/min/{1.73_m2} (ref >59)
GFR calc non Af Amer: 100 mL/min/{1.73_m2} (ref >59)
Globulin, Total: 2.7 g/dL (ref 1.5–4.5)
Glucose: 159 mg/dL — ABNORMAL HIGH (ref 65–99)
Potassium: 3.9 mmol/L (ref 3.5–5.2)
Sodium: 139 mmol/L (ref 134–144)
Total Protein: 7.3 g/dL (ref 6.0–8.5)

## 2018-12-13 LAB — COMPREHENSIVE METABOLIC PANEL
ALT: 226 U/L — ABNORMAL HIGH (ref 0–44)
AST: 98 U/L — ABNORMAL HIGH (ref 15–41)
Albumin: 3.9 g/dL (ref 3.5–5.0)
Alkaline Phosphatase: 136 U/L — ABNORMAL HIGH (ref 38–126)
Anion gap: 10 (ref 5–15)
BUN: 9 mg/dL (ref 6–20)
CO2: 21 mmol/L — ABNORMAL LOW (ref 22–32)
Calcium: 9.1 mg/dL (ref 8.9–10.3)
Chloride: 104 mmol/L (ref 98–111)
Creatinine, Ser: 0.65 mg/dL (ref 0.44–1.00)
GFR calc Af Amer: >60 mL/min (ref >60)
GFR calc non Af Amer: >60 mL/min (ref >60)
Glucose, Bld: 245 mg/dL — ABNORMAL HIGH (ref 70–99)
Potassium: 3.7 mmol/L (ref 3.5–5.1)
Sodium: 135 mmol/L (ref 135–145)
Total Bilirubin: 13.3 mg/dL — ABNORMAL HIGH (ref 0.3–1.2)
Total Protein: 7.4 g/dL (ref 6.5–8.1)

## 2018-12-13 LAB — CBC
HCT: 33.2 % — ABNORMAL LOW (ref 36.0–46.0)
Hemoglobin: 11.6 g/dL — ABNORMAL LOW (ref 12.0–15.0)
MCH: 32.1 pg (ref 26.0–34.0)
MCHC: 34.9 g/dL (ref 30.0–36.0)
MCV: 92 fL (ref 80.0–100.0)
Platelets: 164 10*3/uL (ref 150–400)
RBC: 3.61 MIL/uL — ABNORMAL LOW (ref 3.87–5.11)
RDW: 17.6 % — ABNORMAL HIGH (ref 11.5–15.5)
WBC: 11.9 10*3/uL — ABNORMAL HIGH (ref 4.0–10.5)
nRBC: 0 % (ref 0.0–0.2)

## 2018-12-13 LAB — GLUCOSE, CAPILLARY: Glucose-Capillary: 94 mg/dL (ref 70–99)

## 2018-12-13 LAB — SARS CORONAVIRUS 2 (TAT 6-24 HRS): SARS Coronavirus 2: NEGATIVE

## 2018-12-13 LAB — LACTIC ACID, PLASMA
Lactic Acid, Venous: 1.2 mmol/L (ref 0.5–1.9)
Lactic Acid, Venous: 0.8 mmol/L (ref 0.5–1.9)

## 2018-12-13 LAB — PHOSPHORUS: Phosphorus: 4.2 mg/dL (ref 2.5–4.6)

## 2018-12-13 LAB — I-STAT BETA HCG BLOOD, ED (MC, WL, AP ONLY): I-stat hCG, quantitative: <5 m[IU]/mL (ref <5)

## 2018-12-13 LAB — CBG MONITORING, ED: Glucose-Capillary: 120 mg/dL — ABNORMAL HIGH (ref 70–99)

## 2018-12-13 LAB — MAGNESIUM: Magnesium: 1.8 mg/dL (ref 1.7–2.4)

## 2018-12-13 LAB — HIV ANTIBODY (ROUTINE TESTING W REFLEX): HIV Screen 4th Generation wRfx: NONREACTIVE

## 2018-12-13 LAB — LIPASE, BLOOD: Lipase: 2605 U/L — ABNORMAL HIGH (ref 11–51)

## 2018-12-13 MED ORDER — ACETAMINOPHEN 325 MG PO TABS
650.0000 mg | ORAL_TABLET | Freq: Four times a day (QID) | ORAL | Status: DC | PRN
  Administered 2018-12-13 (×2): 650 mg via ORAL
  Filled 2018-12-13 (×2): qty 2

## 2018-12-13 MED ORDER — IOHEXOL 300 MG/ML  SOLN
100.0000 mL | Freq: Once | INTRAMUSCULAR | Status: AC
  Administered 2018-12-13: 08:00:00 100 mL via INTRAVENOUS

## 2018-12-13 MED ORDER — INSULIN ASPART 100 UNIT/ML ~~LOC~~ SOLN: 0.0000 [IU] | Freq: Three times a day (TID) | SUBCUTANEOUS | Status: DC

## 2018-12-13 MED ORDER — GADOBUTROL 1 MMOL/ML IV SOLN
7.0000 mL | Freq: Once | INTRAVENOUS | Status: AC | PRN
  Administered 2018-12-13: 7 mL via INTRAVENOUS

## 2018-12-13 MED ORDER — ACETAMINOPHEN 650 MG RE SUPP: 650.0000 mg | Freq: Four times a day (QID) | RECTAL | Status: DC | PRN

## 2018-12-13 MED ORDER — PIPERACILLIN-TAZOBACTAM 3.375 G IVPB
3.3750 g | Freq: Three times a day (TID) | INTRAVENOUS | Status: DC
  Administered 2018-12-13 – 2018-12-17 (×12): 3.375 g via INTRAVENOUS
  Filled 2018-12-13 (×13): qty 50

## 2018-12-13 MED ORDER — ONDANSETRON HCL 4 MG/2ML IJ SOLN
4.0000 mg | INTRAMUSCULAR | Status: DC | PRN
  Administered 2018-12-13: 4 mg via INTRAVENOUS
  Filled 2018-12-13: qty 2

## 2018-12-13 MED ORDER — LACTATED RINGERS IV SOLN
INTRAVENOUS | Status: DC
  Administered 2018-12-13 – 2018-12-17 (×10): via INTRAVENOUS

## 2018-12-13 MED ORDER — FENTANYL CITRATE (PF) 100 MCG/2ML IJ SOLN
50.0000 ug | Freq: Once | INTRAMUSCULAR | Status: AC
  Administered 2018-12-13: 50 ug via INTRAVENOUS
  Filled 2018-12-13: qty 2

## 2018-12-13 MED ORDER — PIPERACILLIN-TAZOBACTAM 3.375 G IVPB 30 MIN: 3.3750 g | Freq: Three times a day (TID) | INTRAVENOUS | Status: DC

## 2018-12-13 MED ORDER — ALBUTEROL SULFATE (2.5 MG/3ML) 0.083% IN NEBU: 2.5000 mg | INHALATION_SOLUTION | Freq: Four times a day (QID) | RESPIRATORY_TRACT | Status: DC | PRN

## 2018-12-13 MED ORDER — SODIUM CHLORIDE 0.9 % IV SOLN
Freq: Once | INTRAVENOUS | Status: AC
  Administered 2018-12-13: 06:00:00 via INTRAVENOUS

## 2018-12-13 MED ORDER — INSULIN ASPART 100 UNIT/ML ~~LOC~~ SOLN
0.0000 [IU] | Freq: Four times a day (QID) | SUBCUTANEOUS | Status: DC
  Administered 2018-12-15: 3 [IU] via SUBCUTANEOUS
  Administered 2018-12-15: 1 [IU] via SUBCUTANEOUS
  Administered 2018-12-16: 17:00:00 2 [IU] via SUBCUTANEOUS
  Administered 2018-12-17: 1 [IU] via SUBCUTANEOUS
  Administered 2018-12-17: 3 [IU] via SUBCUTANEOUS

## 2018-12-13 MED ORDER — HYDROMORPHONE HCL 1 MG/ML IJ SOLN
1.0000 mg | INTRAMUSCULAR | Status: DC | PRN
  Administered 2018-12-13 – 2018-12-16 (×2): 1 mg via INTRAVENOUS
  Filled 2018-12-13 (×2): qty 1

## 2018-12-13 MED ORDER — PIPERACILLIN-TAZOBACTAM 3.375 G IVPB 30 MIN
3.3750 g | Freq: Once | INTRAVENOUS | Status: AC
  Administered 2018-12-13: 3.375 g via INTRAVENOUS
  Filled 2018-12-13: qty 50

## 2018-12-13 MED ORDER — SODIUM CHLORIDE 0.9% FLUSH
3.0000 mL | Freq: Once | INTRAVENOUS | Status: AC
  Administered 2018-12-13: 3 mL via INTRAVENOUS

## 2018-12-13 MED ORDER — SODIUM CHLORIDE 0.9 % IV BOLUS
1000.0000 mL | Freq: Once | INTRAVENOUS | Status: AC
  Administered 2018-12-13: 1000 mL via INTRAVENOUS

## 2018-12-13 MED ORDER — ONDANSETRON HCL 4 MG/2ML IJ SOLN
4.0000 mg | Freq: Once | INTRAMUSCULAR | Status: AC
  Administered 2018-12-13: 06:00:00 4 mg via INTRAVENOUS
  Filled 2018-12-13: qty 2

## 2018-12-13 MED ORDER — LACTATED RINGERS IV BOLUS
1000.0000 mL | Freq: Once | INTRAVENOUS | Status: AC
  Administered 2018-12-13: 1000 mL via INTRAVENOUS

## 2018-12-13 MED ORDER — ENOXAPARIN SODIUM 40 MG/0.4ML ~~LOC~~ SOLN
40.0000 mg | SUBCUTANEOUS | Status: DC
  Administered 2018-12-13 – 2018-12-15 (×3): 40 mg via SUBCUTANEOUS
  Filled 2018-12-13 (×3): qty 0.4

## 2018-12-13 MED ORDER — EXEMESTANE 25 MG PO TABS
25.0000 mg | ORAL_TABLET | Freq: Every day | ORAL | Status: DC
  Administered 2018-12-14 – 2018-12-17 (×3): 25 mg via ORAL
  Filled 2018-12-13 (×5): qty 1

## 2018-12-13 MED ORDER — PANTOPRAZOLE SODIUM 40 MG PO TBEC
40.0000 mg | DELAYED_RELEASE_TABLET | Freq: Every day | ORAL | Status: DC
  Administered 2018-12-13 – 2018-12-17 (×4): 40 mg via ORAL
  Filled 2018-12-13 (×4): qty 1

## 2018-12-13 MED ORDER — ONDANSETRON HCL 4 MG/2ML IJ SOLN
4.0000 mg | Freq: Four times a day (QID) | INTRAMUSCULAR | Status: DC | PRN
  Administered 2018-12-16: 14:00:00 4 mg via INTRAVENOUS
  Filled 2018-12-13: qty 2

## 2018-12-13 NOTE — Consult Note (Addendum)
Consultation  Referring Provider:   Dr. Johnney Killian   Primary Care Physician:  Forrest Moron, MD Primary Gastroenterologist:Dr. Fuller Plan         Reason for Consultation: Elevated LFT's, Abdominal pain              HPI:   Mary Dougherty is a 48 y.o. female with a past medical history as listed below including status post chemo and mastectomy for breast cancer, who represented to the ER 12/12/2018 with continued complaints of epigastric abdominal pain.    12/11/2018 patient initially presented in the ED with complaints of sore upper abdomen and chest pain, she left before being seen.    12/12/2018 patient seen by PCP and reported angina and severe pain that radiate from her epigastrium to her back.  Also with jaundice.  She was then sent back to the ER for elevated LFTs.    Today, the patient explains that on Sunday, 12/11/18, after doing some yard work and drinking a milkshake she started with severe epigastric pain which radiated through to her back which is a 10/ 10 out of the blue that evening, this was so severe that she could not even stand up straight, they called their neighbor the fireman who checked her blood pressure and convinced her to go to the ER.  Patient felt like she was going to pass out, arrived in the ER and was told her EKG was normal so she left prior to being seen.  By that time she had taken some Tums and the pain had mostly subsided.  She saw her PCP the next day who did further labs and a repeat EKG and told her that she likely needed follow-up with cardiology.  This morning around 3 AM the pain again was so severe that she presented to the ER again.  Pain is now radiating down to the sides of her abdomen, but the most severe is from her epigastrium straight through to her back rated as a 10/10 sharp pain.  Patient also had a bowel movement this morning prior to arrival in the ER which was pale and "looked weird".  Associated symptoms include nausea.    Denies fever, chills,  weight loss, anorexia, vomiting, heartburn, reflux or previous problems with her gallbladder or pancreatitis.  ED course: Labs significant for white blood cell count 11.9, hemoglobin 11.6, lipase 2605, alk phos 136, AST 98, ALT 226 and total bilirubin 13.3, ultrasound revealed sludge and possible stones in the gallbladder, CT revealed pancreatitis, patient given antibiotics Zosyn and 2 L of fluids with antiemetics and pain medication  GI history: 11/21/2013 colonoscopy Dr. Fuller Plan: Mild diverticulosis in the sigmoid colon, grade 1 internal hemorrhoids  Past Medical History:  Diagnosis Date   Anemia    Blood transfusion without reported diagnosis    Chronic anemia 03/03/13   Dyspnea    with exertion, during chemo   Family history of adverse reaction to anesthesia     father- hard to awaken   Hereditary spherocytosis ([place])    Invasive ductal carcinoma of right breast ([place]) 09/2012   ER(99%), PR(100%), Ki-67(70%), 1/5 nodes positive   Pre-diabetes    off metformin now   Recurrent breast cancer, right ([place]) 09/2017   S/P radiation therapy 05/08/2013-06/22/2013   1) Right breast / 50.4 Gy in 28 fractions / 2) Right Supraclavicular fossa/ 50.4 Gy in 28 fractions /3) Right Posterior Axillary boost / 11.9 Gy in 28 fractions /4) Right breast boost / 10  Gy in 5 fractions    Spherocytosis ([place]) 1989   Status post chemotherapy 10/07/12 - 12/16/12    Neoadjuvant chemotherapy with dose dense FEC (5-FU/epirubicin/Cytoxan) from 10/07/2012 through 12/16/12.     Syncope and collapse 1989   "thats when I found out I have Spherocytosis"   Thyroid disease    Use of exemestane (Aromasin) 07/25/13   Use of goserelin acetate (Zoladex) 07/25/13   Wears glasses     Past Surgical History:  Procedure Laterality Date   BREAST LUMPECTOMY WITH SENTINEL LYMPH NODE BIOPSY Right 04/06/2013   [name]   BREAST SURGERY     left breast lumpectomy/left ax snbx   COLONOSCOPY  2015   MASTECTOMY W/  SENTINEL NODE BIOPSY Right 10/05/2017   Procedure: RIGHT MASTECTOMY WITH RIGHT SENTINEL LYMPH NODE BIOPSY;  Surgeon: [name], MD;  Location: [place];  Service: General;  Laterality: Right;   PORT-A-CATH REMOVAL N/A 04/06/2013   Procedure: REMOVAL PORT-A-CATH;  Surgeon: [name], MD;  Location: [place];  Service: General;  Laterality: N/A;   PORT-A-CATH REMOVAL N/A 11/08/2017   Procedure: REMOVAL PORT-A-CATH;  Surgeon: Greer Pickerel, MD;  [facility];  Service: General;  Laterality: N/A;   PORTACATH PLACEMENT Left 09/20/2012   Procedure: INSERTION PORT-A-CATH;  Surgeon: [name], MD;  Location: [place];  Service: General;  Laterality: Left;   PORTACATH PLACEMENT Right 05/18/2017   Procedure: INSERTION PORT-A-CATH WITH ULTRASOUND;  Surgeon: [name], MD;  Location: [place];  Service: General;  Laterality: Right;   ROBOTIC ASSISTED LAPAROSCOPIC LYSIS OF ADHESION  11/08/2017   Procedure: XI ROBOTIC ASSISTED LAPAROSCOPIC LYSIS OF BOWEL ADHESIONS;  Surgeon: Brien Few, MD;  [facility];  Service: Gynecology;;   ROBOTIC ASSISTED TOTAL HYSTERECTOMY WITH BILATERAL SALPINGO OOPHERECTOMY Bilateral 11/08/2017   Procedure: XI ROBOTIC ASSISTED TOTAL HYSTERECTOMY WITH BILATERAL SALPINGO OOPHORECTOMY;  Surgeon: Brien Few, MD;  [facility];  Service: Gynecology;  Laterality: Bilateral;  [place] for 23 hr obs.   WISDOM TOOTH EXTRACTION      Family History  Problem Relation Age of Onset   Pulmonary embolism Maternal Uncle 38       died instantly   Melanoma Cousin        paternal cousin dx in her 17s   Heart disease Father    Diabetes Father    Thyroid disease Mother     Social History   Tobacco Use   Smoking status: Never Smoker   Smokeless tobacco: Never Used  Substance Use Topics   Alcohol use: No   Drug use: No    Prior to Admission medications   Medication Sig Start Date End  Date Taking? Authorizing Provider  exemestane (AROMASIN) 25 MG tablet Take 1 tablet (25 mg total) by mouth daily with lunch. 05/13/18   Magrinat, Virgie Dad, MD  folic acid (FOLVITE) 086 MCG tablet Take 800 mcg by mouth daily at 12 noon.     [provider]  metFORMIN (GLUCOPHAGE) 500 MG tablet Take 1 tablet (500 mg total) by mouth 2 (two) times daily with a meal. 03/26/18   Wendie Agreste, MD  omeprazole (PRILOSEC) 20 MG capsule Take 1 capsule (20 mg total) by mouth daily. 12/12/18   Forrest Moron, MD    Current Facility-Administered Medications  Medication Dose Route Frequency Provider Last Rate Last Dose   HYDROmorphone (DILAUDID) injection 1 mg  1 mg Intravenous Q2H PRN Charlesetta Shanks, MD  lactated ringers bolus 1,000 mL  1,000 mL Intravenous Once Mesner, Corene Cornea, MD       ondansetron (ZOFRAN) injection 4 mg  4 mg Intravenous Q4H PRN Charlesetta Shanks, MD       piperacillin-tazobactam (ZOSYN) IVPB 3.375 g  3.375 g Intravenous Once Mesner, Corene Cornea, MD 100 mL/hr at 12/13/18 0846 3.375 g at 12/13/18 0846   sodium chloride 0.9 % bolus 1,000 mL  1,000 mL Intravenous Once Charlesetta Shanks, MD       Current Outpatient Medications  Medication Sig Dispense Refill   exemestane (AROMASIN) 25 MG tablet Take 1 tablet (25 mg total) by mouth daily with lunch. 90 tablet 3   folic acid (FOLVITE) 093 MCG tablet Take 800 mcg by mouth daily at 12 noon.      metFORMIN (GLUCOPHAGE) 500 MG tablet Take 1 tablet (500 mg total) by mouth 2 (two) times daily with a meal. 180 tablet 1   omeprazole (PRILOSEC) 20 MG capsule Take 1 capsule (20 mg total) by mouth daily. 30 capsule 3    Allergies as of 12/13/2018   (No Known Allergies)     Review of Systems:    Constitutional: No weight loss, fever or chills Skin: No rash  Cardiovascular: No chest pain Respiratory: No SOB Gastrointestinal: See HPI and otherwise negative Genitourinary: No dysuria  Neurological: No headache Musculoskeletal: No  new muscle or joint pain Hematologic: No bleeding  Psychiatric: No history of depression or anxiety    Physical Exam:  Vital signs in last 24 hours: Temp:  [98.1 F (36.7 C)-98.9 F (37.2 C)] 98.9 F (37.2 C) (11/10 0555) Pulse Rate:  [68-82] 73 (11/10 0730) Resp:  [12-16] 16 (11/10 0630) BP: (114-146)/(63-72) 117/67 (11/10 0730) SpO2:  [96 %-97 %] 97 % (11/10 0730) Weight:  [68.9 kg] 68.9 kg (11/09 1605)   General:   Pleasant overweight Caucasian female appears to be in NAD, Well developed, Well nourished, alert and cooperative Head:  Normocephalic and atraumatic. Eyes:   PEERL, EOMI. No icterus. Conjunctiva pink. Ears:  Normal auditory acuity. Neck:  Supple Throat: Oral cavity and pharynx without inflammation, swelling or lesion. Lungs: Respirations even and unlabored. Lungs clear to auscultation bilaterally.   No wheezes, crackles, or rhonchi.  Heart: Normal S1, S2. No MRG. Regular rate and rhythm. No peripheral edema, cyanosis or pallor.  Abdomen:  Soft, nondistended, marked epigastric ttp with involuntary guarding, Normal bowel sounds. No appreciable masses or hepatomegaly. Rectal:  Not performed.  Msk:  Symmetrical without gross deformities. Peripheral pulses intact.  Extremities:  Without edema, no deformity or joint abnormality.  Neurologic:  Alert and  oriented x4;  grossly normal neurologically.  Skin:   Dry and intact without significant lesions or rashes. Psychiatric: Demonstrates good judgement and reason without abnormal affect or behaviors.  LAB RESULTS: Recent Labs    12/11/18 1743 12/13/18 0553  WBC 12.9* 11.9*  HGB 11.1* 11.6*  HCT 31.5* 33.2*  PLT 174 164   BMET Recent Labs    12/11/18 1743 12/12/18 1712 12/13/18 0553  NA 139 139 135  K 4.0 3.9 3.7  CL 102 102 104  CO2 24 22 21*  GLUCOSE 205* 159* 245*  BUN '12 9 9  '$ CREATININE 0.93 0.71 0.65  CALCIUM 9.0 9.2 9.1   Hepatic Function Latest Ref Rng & Units 12/13/2018 12/12/2018 04/25/2018  Total  Protein 6.5 - 8.1 g/dL 7.4 7.3 7.4  Albumin 3.5 - 5.0 g/dL 3.9 4.6 3.7  AST 15 - 41 U/L 98(H) 114(H) 29  ALT 0 - 44 U/L 226(H) 237(H) 70(H)  Alk Phosphatase 38 - 126 U/L 136(H) 161(H) 108  Total Bilirubin 0.3 - 1.2 mg/dL 13.3(H) 9.1(H) 2.2(H)   STUDIES: Dg Chest 2 View  Result Date: 12/12/2018 CLINICAL DATA:  Syncopal episode, chest pain, shortness of breath EXAM: CHEST - 2 VIEW COMPARISON:  06/11/2017 FINDINGS: There is no focal consolidation. There is no pleural effusion or pneumothorax. The heart and mediastinal contours are unremarkable. There are postsurgical changes with surgical clips in the right breast from prior lumpectomy. There is no acute osseous abnormality. IMPRESSION: No active cardiopulmonary disease. Electronically Signed   By: Kathreen Devoid   On: 12/12/2018 08:40   Ct Abdomen Pelvis W Contrast  Result Date: 12/13/2018 CLINICAL DATA:  Epigastric pain, episode Saturday, resolved, recurrent today at 0300 hours, associated jaundice, lower abdominal pain, leukocytosis, history breast cancer in 2014 with recurrence in 2019 EXAM: CT ABDOMEN AND PELVIS WITH CONTRAST TECHNIQUE: Multidetector CT imaging of the abdomen and pelvis was performed using the standard protocol following bolus administration of intravenous contrast. Sagittal and coronal MPR images reconstructed from axial data set. CONTRAST:  148m OMNIPAQUE IOHEXOL 300 MG/ML SOLN IV. No oral contrast. COMPARISON:  10/26/2012 FINDINGS: Lower chest: Lung bases clear Hepatobiliary: Minimal dependent density in gallbladder suspect tiny calculi. No definite gallbladder wall thickening. Liver unremarkable. No biliary dilatation or biliary air. Pancreas: Edema identified adjacent to pancreatic tail most consistent with pancreatitis. Edema is also seen extending into the small bowel mesentery inferior to the stomach and posterior to the transverse colon. No focal fluid collection. No pancreatic hemorrhage or necrosis. Spleen: Enlarged, 13.7  x 15.2 x 5.1 cm (volume = 560 cm^3). No focal lesions. Small splenule. Adrenals/Urinary Tract: Tiny cyst at upper pole LEFT kidney. Adrenal glands, kidneys, ureters, and bladder otherwise normal appearance. Stomach/Bowel: Minimal diverticulosis of descending and sigmoid colon. Normal appendix. Stomach and bowel loops otherwise normal appearance. Vascular/Lymphatic: Minimal atherosclerotic calcification aorta. Aorta normal caliber. No adenopathy. Portal, splenic, and superior mesenteric veins patent. Reproductive: Uterus surgically absent with nonvisualization of ovaries Other: No free air or free fluid. Tiny umbilical hernia containing fat. Musculoskeletal: No acute osseous findings. IMPRESSION: Edema adjacent to the pancreatic tail consistent with pancreatitis; recommend correlation with serum amylase/lipase. No evidence of pancreatic hemorrhage or necrosis. Mild splenomegaly. Suspectedcholelithiasis. Minimal distal colonic diverticulosis. Aortic Atherosclerosis (ICD10-I70.0). Electronically Signed   By: MLavonia DanaM.D.   On: 12/13/2018 08:29   UKoreaAbdomen Limited Ruq  Result Date: 12/13/2018 CLINICAL DATA:  Onset right upper quadrant pain at 3 a.m. last night. EXAM: ULTRASOUND ABDOMEN LIMITED RIGHT UPPER QUADRANT COMPARISON:  None. FINDINGS: Gallbladder: There is sludge within the gallbladder. There may be a few small stones present. Multiple punctate echogenic foci in the non dependent gallbladder are also seen. No wall thickening or pericholecystic fluid. Common bile duct: Diameter: 0.6 cm Liver: Echogenicity is increased in echotexture is heterogeneous. No focal lesion. Portal vein is patent on color Doppler imaging with normal direction of blood flow towards the liver. Other: None. IMPRESSION: Sludge and possibly small stones within the gallbladder. Nondependent echogenic foci in the gallbladder worrisome for the presence of intraluminal gas. CT scan with contrast is recommended for further evaluation.  Electronically Signed   By: TInge RiseM.D.   On: 12/13/2018 06:57     Impression / Plan:   Impression: 1.  Elevated LFTs in the setting of cholelithiasis and pancreatitis: Bilirubin up to 13.3 , ultrasound with sludge and possibly small stones in the  gallbladder and nondependent echogenic foci in the gallbladder worrisome for the presence of intraluminal gas, CT with edema adjacent to the pancreatic tail consistent with pancreatitis, no evidence of pancreatic hemorrhage or necrosis, mild splenomegaly, suspected cholelithiasis concern for choledocholithiasis 2.  Leukocytosis 3.  History of breast cancer  Plan: 1.  Continue antibiotics 2.  Agree with MRCP for further evaluation of choledocholithiasis 3.  Discussed possibility of ERCP with the patient.  Discussed risks, benefits,  limitations and alternatives and the patient agrees to proceed if necessary. 4.  Continue to monitor LFTs 5.  Continue antiemetics and analgesics as well as fluids 6.  NPO with ice chips for now 7.  Please await any further recommendations from Dr. Henrene Pastor later today  Thank you for your kind consultation, we will continue to follow.  Lavone Nian Lemmon  12/13/2018, 9:09 AM  GI ATTENDING  History, laboratories, x-rays reviewed.  Agree with comprehensive consultation note as outlined.  Patient presents with what appears to be biliary pancreatitis.  Disproportionate elevation in bilirubin.  Nondilated bile duct on ultrasound.  Agree with MRCP to rule out choledocholithiasis versus "groove pancreatitis".  Recommend aggressive hydration and monitoring hemoglobin, BUN, and liver tests.  Will follow.  Docia Chuck. Geri Seminole., M.D. [facility] Division of Gastroenterology

## 2018-12-13 NOTE — ED Notes (Signed)
Pt transported to CT ?

## 2018-12-13 NOTE — ED Triage Notes (Signed)
Patient from home, was seen on 11/8 for same.  She woke up this morning with pain in bilateral flank and upper abdominal pain.  Patient is jaundiced in her sclera and her skin.  Patient had BM which she described as light, clay color.  She does have nausea, no vomiting.

## 2018-12-13 NOTE — ED Notes (Signed)
Patient transported to MRI 

## 2018-12-13 NOTE — ED Notes (Signed)
Returned from U/S

## 2018-12-13 NOTE — ED Provider Notes (Signed)
Emergency Department Provider Note   I have reviewed the triage vital signs and the nursing notes.   HISTORY  Chief Complaint Abdominal Pain and Flank Pain   HPI Mary Dougherty is a 48 y.o. female whop resents to the emergency department with persistent epigastrec and ruq abdominal pain radiating to back. Started a couple days ago after supper, but resolved. Had come here but LWBS after her ecg was ok. Saw her pcp yesterday who noticed jaundice and sent labs. Woke up this morning witih severe pain again, returns for eval. No recent infections. No urinary symptoms. H/o spherocytosis.     No other associated or modifying symptoms.    Past Medical History:  Diagnosis Date  . Anemia   . Blood transfusion without reported diagnosis   . Chronic anemia 03/03/13  . Dyspnea    with exertion, during chemo  . Family history of adverse reaction to anesthesia     father- hard to awaken  . Hereditary spherocytosis ([place])   . Invasive ductal carcinoma of right breast ([place]) 09/2012   ER(99%), PR(100%), Ki-67(70%), 1/5 nodes positive  . Pre-diabetes    off metformin now  . Recurrent breast cancer, right ([place]) 09/2017  . S/P radiation therapy 05/08/2013-06/22/2013   1) Right breast / 50.4 Gy in 28 fractions / 2) Right Supraclavicular fossa/ 50.4 Gy in 28 fractions /3) Right Posterior Axillary boost / 11.9 Gy in 28 fractions /4) Right breast boost / 10 Gy in 5 fractions   . Spherocytosis ([place]) 1989  . Status post chemotherapy 10/07/12 - 12/16/12    Neoadjuvant chemotherapy with dose dense FEC (5-FU/epirubicin/Cytoxan) from 10/07/2012 through 12/16/12.    Marland Kitchen Syncope and collapse 1989   "thats when I found out I have Spherocytosis"  . Thyroid disease   . Use of exemestane (Aromasin) 07/25/13  . Use of goserelin acetate (Zoladex) 07/25/13  . Wears glasses     Patient Active Problem List   Diagnosis Date Noted  . Breast cancer ([place]) 11/08/2017  . Breast cancer, right ([place]) 10/05/2017  .  Port-A-Cath in place 08/13/2017  . Type 2 diabetes mellitus with complication, without long-term current use of insulin ([place]) 06/26/2017  . Elevated bilirubin 06/26/2017  . Neoplastic malignant related fatigue 06/26/2017  . Syncope 06/11/2017  . Anemia 06/11/2017  . Diabetes mellitus without complication ([place]) 67/67/2094  . Malignant neoplasm of lower-outer quadrant of right breast of female, estrogen receptor positive ([place]) 04/28/2017  . HS (hereditary spherocytosis) ([place]) 09/20/2013  . Anemia, unspecified 01/27/2013  . Syncope, vasovagal 01/17/2013    Class: Chronic  . Genital herpes 10/14/2012  . Breast cancer of lower-outer quadrant of right female breast ([place]) 09/08/2012    Past Surgical History:  Procedure Laterality Date  . BREAST LUMPECTOMY WITH SENTINEL LYMPH NODE BIOPSY Right 04/06/2013   [name]  . BREAST SURGERY     left breast lumpectomy/left ax snbx  . COLONOSCOPY  2015  . MASTECTOMY W/ SENTINEL NODE BIOPSY Right 10/05/2017   Procedure: RIGHT MASTECTOMY WITH RIGHT SENTINEL LYMPH NODE BIOPSY;  Surgeon: [name], MD;  Location: [place];  Service: General;  Laterality: Right;  . PORT-A-CATH REMOVAL N/A 04/06/2013   Procedure: REMOVAL PORT-A-CATH;  Surgeon: [name], MD;  Location: [place];  Service: General;  Laterality: N/A;  . PORT-A-CATH REMOVAL N/A 11/08/2017   Procedure: REMOVAL PORT-A-CATH;  Surgeon: Greer Pickerel, MD;  [facility];  Service: General;  Laterality: N/A;  . PORTACATH PLACEMENT Left 09/20/2012  Procedure: INSERTION PORT-A-CATH;  Surgeon: [name], MD;  Location: [place];  Service: General;  Laterality: Left;  . PORTACATH PLACEMENT Right 05/18/2017   Procedure: INSERTION PORT-A-CATH WITH ULTRASOUND;  Surgeon: [name], MD;  Location: [place];  Service: General;  Laterality: Right;  . ROBOTIC ASSISTED LAPAROSCOPIC LYSIS OF ADHESION  11/08/2017   Procedure: XI  ROBOTIC ASSISTED LAPAROSCOPIC LYSIS OF BOWEL ADHESIONS;  Surgeon: Brien Few, MD;  [facility];  Service: Gynecology;;  . ROBOTIC ASSISTED TOTAL HYSTERECTOMY WITH BILATERAL SALPINGO OOPHERECTOMY Bilateral 11/08/2017   Procedure: XI ROBOTIC ASSISTED TOTAL HYSTERECTOMY WITH BILATERAL SALPINGO OOPHORECTOMY;  Surgeon: Brien Few, MD;  [facility];  Service: Gynecology;  Laterality: Bilateral;  [place] for 23 hr obs.  . WISDOM TOOTH EXTRACTION      Current Outpatient Rx  . Order #: [id number] Class: Normal  . Order #: [id number] Class: Historical Med  . Order #: [id number] Class: Normal  . Order #: [id number] Class: Normal    Allergies Patient has no known allergies.  Family History  Problem Relation Age of Onset  . Pulmonary embolism Maternal Uncle 89       died instantly  . Melanoma Cousin        paternal cousin dx in her 35s  . Heart disease Father   . Diabetes Father   . Thyroid disease Mother     Social History Social History   Tobacco Use  . Smoking status: Never Smoker  . Smokeless tobacco: Never Used  Substance Use Topics  . Alcohol use: No  . Drug use: No    Review of Systems  All other systems negative except as documented in the HPI. All pertinent positives and negatives as reviewed in the HPI. ____________________________________________   PHYSICAL EXAM:  VITAL SIGNS: Vitals:   12/13/18 0555  BP: (!) 146/70  Pulse: 75  Resp: 16  Temp: 98.9 F (37.2 C)  TempSrc: Oral  SpO2: 97%    Constitutional: Alert and oriented. Well appearing and in no acute distress. Eyes: Conjunctivae are icteric. PERRL. EOMI. Head: Atraumatic. Nose: No congestion/rhinnorhea. Mouth/Throat: Mucous membranes are moist.  Oropharynx non-erythematous. Neck: No stridor.  No meningeal signs.   Cardiovascular: Normal rate, regular rhythm. Good peripheral circulation. Grossly normal heart sounds.   Respiratory: Normal respiratory effort.  No retractions. Lungs CTAB.  Gastrointestinal: Soft and nontender. No distention.  Musculoskeletal: No lower extremity tenderness nor edema. No gross deformities of extremities. Neurologic:  Normal speech and language. No gross focal neurologic deficits are appreciated.  Skin:  Skin is warm, dry and intact. Jaundice noted.   ____________________________________________   LABS (all labs ordered are listed, but only abnormal results are displayed)  Labs Reviewed  LIPASE, BLOOD  COMPREHENSIVE METABOLIC PANEL  CBC  URINALYSIS, ROUTINE W REFLEX MICROSCOPIC  I-STAT BETA HCG BLOOD, ED ([facility], [facility], [facility] ONLY)   ____________________________________________  EKG   EKG Interpretation  Date/Time:    Ventricular Rate:    PR Interval:    QRS Duration:   QT Interval:    QTC Calculation:   R Axis:     Text Interpretation:         ____________________________________________  RADIOLOGY  No results found.  ____________________________________________   PROCEDURES  Procedure(s) performed:   Procedures   ____________________________________________   INITIAL IMPRESSION / ASSESSMENT AND PLAN / ED COURSE  Choledocholithiasis vs less likely pancreatic mass vs splenic sequestration and breakdown 2/2 spherocytosis.   [place] equivocal, recommend ct. Care transferred pending same and likely admission. Marland Kitchen  Pertinent labs & imaging results that were available during my care of the patient were reviewed by me and considered in my medical decision making (see chart for details).   ____________________________________________  FINAL CLINICAL IMPRESSION(S) / ED DIAGNOSES  Final diagnoses:  Abdominal pain     MEDICATIONS GIVEN DURING THIS VISIT:  Medications  sodium chloride flush (NS) 0.9 % injection 3 mL (has no administration in time range)  fentaNYL (SUBLIMAZE) injection 50 mcg (has no administration in time range)  ondansetron (ZOFRAN) injection 4 mg (has no administration in time range)     NEW  OUTPATIENT MEDICATIONS STARTED DURING THIS VISIT:  New Prescriptions   No medications on file    Note:  This note was prepared with assistance of Dragon voice recognition software. Occasional wrong-word or sound-a-like substitutions may have occurred due to the inherent limitations of voice recognition software.   Merrily Pew, MD 12/13/18 [phone]

## 2018-12-13 NOTE — Progress Notes (Addendum)
Text page sent to Dr. Tamala Julian about CBG slowly dropping.  Spoke with Dr. Tamala Julian, gave order to switch CBG checks to q6 hours.

## 2018-12-13 NOTE — ED Notes (Signed)
Pt ambulatory to the bathroom 

## 2018-12-13 NOTE — H&P (Signed)
History and Physical    AKELIA HUSTED Dougherty:[id number] DOB: 1970-12-20 DOA: 12/13/2018  Referring MD/NP/PA: Charlesetta Shanks, MD PCP: Forrest Moron, MD  Patient coming from: Home  Chief Complaint: Abdominal pain  I have personally briefly reviewed patient's old medical records in [place]   HPI: Mary Dougherty is a 48 y.o. female with medical history significant of anemia with hereditary spherocytosis, diabetes mellitus type 2, breast cancer(s/p lumpectomy, chemotherapy, and radiation), who presents with complaints of epigastric abdominal pain.  Symptoms initially started around 4 PM 2 days ago after she had eaten at 2 PM and then had a milkshake.  Describes the pain as sharp with radiation to her back.  She initially thought symptoms were related to gas or possibly a heart attack as heart disease runs in her family.  She took Tums and a baby aspirin without significant change in symptoms.  She came to the emergency department for further evaluation and had a EKG done, but due to the wait time left before being fully evaluated. She followed up with her primary care provider yesterday where lab work was obtained as she was found to have yellowing of her eyes.  She continued to have epigastric abdominal pain and reported that her stools were very light in color.  Patient denies having any significant fever, shortness of breath, nausea, vomiting, diarrhea, or dysuria.  ED Course: Upon admission into the emergency department patient was noted to have stable vital signs.  Labs significant for WBC 11.9, hemoglobin 11.6, lipase 2605, alkaline phosphatase 136, AST 98, ALT 226, and total bilirubin 13.3.  Ultrasound revealed sludge and possible stones in the gallbladder.  CT scan of the abdomen and pelvis revealed pancreatitis.  [facility] was consulted.  Patient was given antibiotics of Zosyn, 2 L of IV fluids, antiemetics and pain medication.  TRH called to admit.  Review of Systems    Constitutional: Negative for fever.  HENT: Negative for hearing loss.   Eyes: Negative for photophobia and pain.  Respiratory: Negative for cough and shortness of breath.   Cardiovascular: Negative for chest pain and leg swelling.  Gastrointestinal: Positive for abdominal pain. Negative for blood in stool.  Genitourinary: Negative for dysuria and hematuria.  Musculoskeletal: Positive for back pain.  Skin: Negative for itching and rash.  Neurological: Negative for focal weakness and loss of consciousness.  Psychiatric/Behavioral: Negative for substance abuse. The patient is not nervous/anxious.     Past Medical History:  Diagnosis Date   Anemia    Blood transfusion without reported diagnosis    Chronic anemia 03/03/13   Dyspnea    with exertion, during chemo   Family history of adverse reaction to anesthesia     father- hard to awaken   Hereditary spherocytosis ([place])    Invasive ductal carcinoma of right breast ([place]) 09/2012   ER(99%), PR(100%), Ki-67(70%), 1/5 nodes positive   Pre-diabetes    off metformin now   Recurrent breast cancer, right ([place]) 09/2017   S/P radiation therapy 05/08/2013-06/22/2013   1) Right breast / 50.4 Gy in 28 fractions / 2) Right Supraclavicular fossa/ 50.4 Gy in 28 fractions /3) Right Posterior Axillary boost / 11.9 Gy in 28 fractions /4) Right breast boost / 10 Gy in 5 fractions    Spherocytosis ([place]) 1989   Status post chemotherapy 10/07/12 - 12/16/12    Neoadjuvant chemotherapy with dose dense FEC (5-FU/epirubicin/Cytoxan) from 10/07/2012 through 12/16/12.     Syncope and collapse 1989   "thats when  I found out I have Spherocytosis"   Thyroid disease    Use of exemestane (Aromasin) 07/25/13   Use of goserelin acetate (Zoladex) 07/25/13   Wears glasses     Past Surgical History:  Procedure Laterality Date   BREAST LUMPECTOMY WITH SENTINEL LYMPH NODE BIOPSY Right 04/06/2013   [name]   BREAST SURGERY     left breast  lumpectomy/left ax snbx   COLONOSCOPY  2015   MASTECTOMY W/ SENTINEL NODE BIOPSY Right 10/05/2017   Procedure: RIGHT MASTECTOMY WITH RIGHT SENTINEL LYMPH NODE BIOPSY;  Surgeon: [name], MD;  Location: [facility];  Service: General;  Laterality: Right;   PORT-A-CATH REMOVAL N/A 04/06/2013   Procedure: REMOVAL PORT-A-CATH;  Surgeon: [name], MD;  Location: [facility];  Service: General;  Laterality: N/A;   PORT-A-CATH REMOVAL N/A 11/08/2017   Procedure: REMOVAL PORT-A-CATH;  Surgeon: Greer Pickerel, MD;  Location: [facility];  Service: General;  Laterality: N/A;   PORTACATH PLACEMENT Left 09/20/2012   Procedure: INSERTION PORT-A-CATH;  Surgeon: [name], MD;  Location: [facility];  Service: General;  Laterality: Left;   PORTACATH PLACEMENT Right 05/18/2017   Procedure: INSERTION PORT-A-CATH WITH ULTRASOUND;  Surgeon: [name], MD;  Location: [facility] [facility];  Service: General;  Laterality: Right;   ROBOTIC ASSISTED LAPAROSCOPIC LYSIS OF ADHESION  11/08/2017   Procedure: XI ROBOTIC ASSISTED LAPAROSCOPIC LYSIS OF BOWEL ADHESIONS;  Surgeon: Brien Few, MD;  Location: [facility];  Service: Gynecology;;   ROBOTIC ASSISTED TOTAL HYSTERECTOMY WITH BILATERAL SALPINGO OOPHERECTOMY Bilateral 11/08/2017   Procedure: XI ROBOTIC ASSISTED TOTAL HYSTERECTOMY WITH BILATERAL SALPINGO OOPHORECTOMY;  Surgeon: Brien Few, MD;  Location: [facility];  Service: Gynecology;  Laterality: Bilateral;  [place] for 23 hr obs.   WISDOM TOOTH EXTRACTION       reports that she has never smoked. She has never used smokeless tobacco. She reports that she does not drink alcohol or use drugs.  No Known Allergies  Family History  Problem Relation Age of Onset   Pulmonary embolism Maternal Uncle 76       died instantly   Melanoma Cousin        paternal cousin dx in her 29s   Heart disease Father    Diabetes Father    Thyroid disease Mother     Prior  to Admission medications   Medication Sig Start Date End Date Taking? Authorizing Provider  exemestane (AROMASIN) 25 MG tablet Take 1 tablet (25 mg total) by mouth daily with lunch. 05/13/18   Magrinat, Virgie Dad, MD  folic acid (FOLVITE) 702 MCG tablet Take 800 mcg by mouth daily at 12 noon.     [provider]  metFORMIN (GLUCOPHAGE) 500 MG tablet Take 1 tablet (500 mg total) by mouth 2 (two) times daily with a meal. 03/26/18   Wendie Agreste, MD  omeprazole (PRILOSEC) 20 MG capsule Take 1 capsule (20 mg total) by mouth daily. 12/12/18   Forrest Moron, MD    Physical Exam:  Constitutional: Middle-aged female NAD, calm, comfortable Vitals:   12/13/18 0630 12/13/18 0645 12/13/18 0715 12/13/18 0730  BP: 124/65 121/63 118/65 117/67  Pulse: 82 71 68 73  Resp: 16     Temp:      TempSrc:      SpO2: 96% 96% 96% 97%   Eyes: Scleral icterus present.  Pupils otherwise equal round and reactive to light ENMT: Mucous membranes are moist. Posterior pharynx clear of any exudate or lesions.  Neck: normal, supple, no masses, no thyromegaly Respiratory: clear to auscultation bilaterally, no wheezing, no crackles. Normal respiratory effort. No accessory muscle use.  Cardiovascular: Regular rate and rhythm, no murmurs / rubs / gallops. No extremity edema. 2+ pedal pulses. No carotid bruits.  Abdomen: Mild epigastric tenderness noted. Hepatosplenomegaly. Bowel sounds positive.  Musculoskeletal: no clubbing / cyanosis. No joint deformity upper and lower extremities. Good ROM, no contractures. Normal muscle tone.  Skin: no rashes, lesions, ulcers. No induration Neurologic: CN 2-12 grossly intact. Sensation intact, DTR normal. Strength 5/5 in all 4.  Psychiatric: Normal judgment and insight. Alert and oriented x 3. Normal mood.     Labs on Admission: I have personally reviewed following labs and imaging studies  CBC: Recent Labs  Lab 12/11/18 1743 12/13/18 0553  WBC 12.9* 11.9*  HGB  11.1* 11.6*  HCT 31.5* 33.2*  MCV 92.1 92.0  PLT 174 144   Basic Metabolic Panel: Recent Labs  Lab 12/11/18 1743 12/12/18 1712 12/13/18 0553  NA 139 139 135  K 4.0 3.9 3.7  CL 102 102 104  CO2 24 22 21*  GLUCOSE 205* 159* 245*  BUN '12 9 9  '$ CREATININE 0.93 0.71 0.65  CALCIUM 9.0 9.2 9.1   GFR: Estimated Creatinine Clearance: 82.1 mL/min (by C-G formula based on SCr of 0.65 mg/dL). Liver Function Tests: Recent Labs  Lab 12/12/18 1712 12/13/18 0553  AST 114* 98*  ALT 237* 226*  ALKPHOS 161* 136*  BILITOT 9.1* 13.3*  PROT 7.3 7.4  ALBUMIN 4.6 3.9   Recent Labs  Lab 12/11/18 1743 12/13/18 0553  LIPASE 28 2,605*   No results for input(s): AMMONIA in the last 168 hours. Coagulation Profile: No results for input(s): INR, PROTIME in the last 168 hours. Cardiac Enzymes: No results for input(s): CKTOTAL, CKMB, CKMBINDEX, TROPONINI in the last 168 hours. BNP (last 3 results) No results for input(s): PROBNP in the last 8760 hours. HbA1C: No results for input(s): HGBA1C in the last 72 hours. CBG: No results for input(s): GLUCAP in the last 168 hours. Lipid Profile: No results for input(s): CHOL, HDL, LDLCALC, TRIG, CHOLHDL, LDLDIRECT in the last 72 hours. Thyroid Function Tests: No results for input(s): TSH, T4TOTAL, FREET4, T3FREE, THYROIDAB in the last 72 hours. Anemia Panel: No results for input(s): VITAMINB12, FOLATE, FERRITIN, TIBC, IRON, RETICCTPCT in the last 72 hours. Urine analysis:    Component Value Date/Time   COLORURINE AMBER (A) 12/13/2018 0845   APPEARANCEUR CLEAR 12/13/2018 0845   LABSPEC 1.019 12/13/2018 0845   PHURINE 5.0 12/13/2018 0845   GLUCOSEU NEGATIVE 12/13/2018 0845   HGBUR NEGATIVE 12/13/2018 0845   BILIRUBINUR SMALL (A) 12/13/2018 0845   BILIRUBINUR negative 03/26/2018 0925   KETONESUR NEGATIVE 12/13/2018 0845   PROTEINUR NEGATIVE 12/13/2018 0845   UROBILINOGEN 1.0 03/26/2018 0925   NITRITE NEGATIVE 12/13/2018 0845   LEUKOCYTESUR  MODERATE (A) 12/13/2018 0845   Sepsis Labs: No results found for this or any previous visit (from the past 240 hour(s)).   Radiological Exams on Admission: Dg Chest 2 View  Result Date: 12/12/2018 CLINICAL DATA:  Syncopal episode, chest pain, shortness of breath EXAM: CHEST - 2 VIEW COMPARISON:  06/11/2017 FINDINGS: There is no focal consolidation. There is no pleural effusion or pneumothorax. The heart and mediastinal contours are unremarkable. There are postsurgical changes with surgical clips in the right breast from prior lumpectomy. There is no acute osseous abnormality. IMPRESSION: No active cardiopulmonary disease. Electronically Signed   By: Kathreen Devoid   On:  12/12/2018 08:40   Ct Abdomen Pelvis W Contrast  Result Date: 12/13/2018 CLINICAL DATA:  Epigastric pain, episode Saturday, resolved, recurrent today at 0300 hours, associated jaundice, lower abdominal pain, leukocytosis, history breast cancer in 2014 with recurrence in 2019 EXAM: CT ABDOMEN AND PELVIS WITH CONTRAST TECHNIQUE: Multidetector CT imaging of the abdomen and pelvis was performed using the standard protocol following bolus administration of intravenous contrast. Sagittal and coronal MPR images reconstructed from axial data set. CONTRAST:  173m OMNIPAQUE IOHEXOL 300 MG/ML SOLN IV. No oral contrast. COMPARISON:  10/26/2012 FINDINGS: Lower chest: Lung bases clear Hepatobiliary: Minimal dependent density in gallbladder suspect tiny calculi. No definite gallbladder wall thickening. Liver unremarkable. No biliary dilatation or biliary air. Pancreas: Edema identified adjacent to pancreatic tail most consistent with pancreatitis. Edema is also seen extending into the small bowel mesentery inferior to the stomach and posterior to the transverse colon. No focal fluid collection. No pancreatic hemorrhage or necrosis. Spleen: Enlarged, 13.7 x 15.2 x 5.1 cm (volume = 560 cm^3). No focal lesions. Small splenule. Adrenals/Urinary Tract: Tiny  cyst at upper pole LEFT kidney. Adrenal glands, kidneys, ureters, and bladder otherwise normal appearance. Stomach/Bowel: Minimal diverticulosis of descending and sigmoid colon. Normal appendix. Stomach and bowel loops otherwise normal appearance. Vascular/Lymphatic: Minimal atherosclerotic calcification aorta. Aorta normal caliber. No adenopathy. Portal, splenic, and superior mesenteric veins patent. Reproductive: Uterus surgically absent with nonvisualization of ovaries Other: No free air or free fluid. Tiny umbilical hernia containing fat. Musculoskeletal: No acute osseous findings. IMPRESSION: Edema adjacent to the pancreatic tail consistent with pancreatitis; recommend correlation with serum amylase/lipase. No evidence of pancreatic hemorrhage or necrosis. Mild splenomegaly. Suspectedcholelithiasis. Minimal distal colonic diverticulosis. Aortic Atherosclerosis (ICD10-I70.0). Electronically Signed   By: MLavonia DanaM.D.   On: 12/13/2018 08:29   UKoreaAbdomen Limited Ruq  Result Date: 12/13/2018 CLINICAL DATA:  Onset right upper quadrant pain at 3 a.m. last night. EXAM: ULTRASOUND ABDOMEN LIMITED RIGHT UPPER QUADRANT COMPARISON:  None. FINDINGS: Gallbladder: There is sludge within the gallbladder. There may be a few small stones present. Multiple punctate echogenic foci in the non dependent gallbladder are also seen. No wall thickening or pericholecystic fluid. Common bile duct: Diameter: 0.6 cm Liver: Echogenicity is increased in echotexture is heterogeneous. No focal lesion. Portal vein is patent on color Doppler imaging with normal direction of blood flow towards the liver. Other: None. IMPRESSION: Sludge and possibly small stones within the gallbladder. Nondependent echogenic foci in the gallbladder worrisome for the presence of intraluminal gas. CT scan with contrast is recommended for further evaluation. Electronically Signed   By: TInge RiseM.D.   On: 12/13/2018 06:57    EKG: Independently  reviewed from 11/8.  Normal sinus rhythm at 74 bpm  Assessment/Plan Transaminitis and hyperbilirubinemia secondary to suspect Bilary pancreatitis: Patient presents with complaints of abdominal pain.  Lipase 2605, total bilirubin 13.3, alkaline phosphatase 134, AST 98, and ALT 226.  Due to imaging studies confirmed signs of pancreatitis with biliary sludge and stones seen. -Admit to a medsurg  bed -N.p.o. -Check MRCP -Lactated Ringer's at 150 mL/h -Pain control -Incentive spirometry -Appreciate GI consultative services, we will follow-up for further recommendations  Cholelithiasis: Imaging studies revealed sludge and possible small stones within the gallbladder with signs of intraluminal gas. -IV antibiotics of Zosyn  Leukocytosis: Acute.  WBC elevated at 11.9.  Suspect secondary to above. -Recheck CBC in a.m.  Anemia: Hemoglobin 11.6 which appears near patient's baseline.  Patient denies any reports of bleeding. -Continue to monitor  Diabetes mellitus type 2: Patient reports history of diabetes and currently on format for treatment.  Last hemoglobin A1c noted to be 7.8 on 03/26/2018   -Check hemoglobin A1c in a.m. -Hypoglycemic protocol -Hold Metformin  -Sensitive SSI  History of breast cancer: Patient -Continue Aromasin  GERD -Continue pharmacy substitution for omeprazole  DVT prophylaxis: Lovenox Code Status: Full Family Communication: No family present at bedside Disposition Plan: Possible discharge home in 2 to 3 days depending on work-up Consults called: GI Admission status: Inpatient  Norval Morton MD [facility] Pager [phone]   If 7PM-7AM, please contact night-coverage [email or web address] Password [email or web address]  12/13/2018, 9:22 AM

## 2018-12-13 NOTE — ED Notes (Signed)
Patient transported to Ultrasound 

## 2018-12-13 NOTE — ED Provider Notes (Signed)
Awaiting CT review and admission.  Lipase elevated, ultrasound showing gas in the gallbladder wall.  Patient is jaundiced.  Zosyn being initiated.  Pain currently controlled.  We will proceed with consults for admission. Physical Exam  BP 117/67   Pulse 73   Temp 98.9 F (37.2 C) (Oral)   Resp 16   LMP 10/28/2012   SpO2 97%   Physical Exam  ED Course/Procedures   Clinical Course as of Dec 13 910  Tue Dec 13, 2018  0829 Consult: Anderson Malta lemon for low-power GI.  Have reviewed case consult will be done in the emergency department.   [MP]  [phone] Patient updated on CT results.  She has had rebound of pain.  Pain medications have been ordered.  Patient is alert and appropriate.  No confusion.   [MP]    Clinical Course User Index [MP] Charlesetta Shanks, MD    Procedures  MDM  Consultation to GI has been placed.  Awaiting consultation with general surgery and medical admission.  Patient has pancreatitis with significantly elevated total bili.  She is alert and appropriate.  No respiratory distress.  Plan for admission.       Charlesetta Shanks, MD 12/13/18 [phone]

## 2018-12-13 NOTE — ED Notes (Signed)
Lab to add on HIV labs.

## 2018-12-14 DIAGNOSIS — R7401 Elevation of levels of liver transaminase levels: Secondary | ICD-10-CM

## 2018-12-14 DIAGNOSIS — R1013 Epigastric pain: Secondary | ICD-10-CM

## 2018-12-14 DIAGNOSIS — R109 Unspecified abdominal pain: Secondary | ICD-10-CM

## 2018-12-14 LAB — CBC
HCT: 31.8 % — ABNORMAL LOW (ref 36.0–46.0)
Hemoglobin: 11.1 g/dL — ABNORMAL LOW (ref 12.0–15.0)
MCH: 32.6 pg (ref 26.0–34.0)
MCHC: 34.9 g/dL (ref 30.0–36.0)
MCV: 93.5 fL (ref 80.0–100.0)
Platelets: 146 10*3/uL — ABNORMAL LOW (ref 150–400)
RBC: 3.4 MIL/uL — ABNORMAL LOW (ref 3.87–5.11)
RDW: 17.7 % — ABNORMAL HIGH (ref 11.5–15.5)
WBC: 9.4 10*3/uL (ref 4.0–10.5)
nRBC: 0 % (ref 0.0–0.2)

## 2018-12-14 LAB — COMPREHENSIVE METABOLIC PANEL
ALT: 159 U/L — ABNORMAL HIGH (ref 0–44)
AST: 57 U/L — ABNORMAL HIGH (ref 15–41)
Albumin: 3.4 g/dL — ABNORMAL LOW (ref 3.5–5.0)
Alkaline Phosphatase: 121 U/L (ref 38–126)
Anion gap: 12 (ref 5–15)
BUN: 5 mg/dL — ABNORMAL LOW (ref 6–20)
CO2: 22 mmol/L (ref 22–32)
Calcium: 8.9 mg/dL (ref 8.9–10.3)
Chloride: 105 mmol/L (ref 98–111)
Creatinine, Ser: 0.64 mg/dL (ref 0.44–1.00)
GFR calc Af Amer: >60 mL/min (ref >60)
GFR calc non Af Amer: >60 mL/min (ref >60)
Glucose, Bld: 89 mg/dL (ref 70–99)
Potassium: 3.5 mmol/L (ref 3.5–5.1)
Sodium: 139 mmol/L (ref 135–145)
Total Bilirubin: 13.1 mg/dL — ABNORMAL HIGH (ref 0.3–1.2)
Total Protein: 6.8 g/dL (ref 6.5–8.1)

## 2018-12-14 LAB — HEMOGLOBIN A1C
Hgb A1c MFr Bld: 5.1 % (ref 4.8–5.6)
Mean Plasma Glucose: 99.67 mg/dL

## 2018-12-14 LAB — GLUCOSE, CAPILLARY
Glucose-Capillary: 116 mg/dL — ABNORMAL HIGH (ref 70–99)
Glucose-Capillary: 72 mg/dL (ref 70–99)
Glucose-Capillary: 83 mg/dL (ref 70–99)
Glucose-Capillary: 85 mg/dL (ref 70–99)

## 2018-12-14 NOTE — Plan of Care (Signed)
  Problem: Clinical Measurements: Goal: Ability to maintain clinical measurements within normal limits will improve Outcome: Progressing   Problem: Clinical Measurements: Goal: Respiratory complications will improve Outcome: Progressing   Problem: Clinical Measurements: Goal: Cardiovascular complication will be avoided Outcome: Progressing   

## 2018-12-14 NOTE — Consult Note (Signed)
[facility] Consult Note  Mary Dougherty 08-30-1970  [id number].    Requesting MD: Scarlette Shorts Chief Complaint/Reason for Consult: gallstone pancreatitis  HPI:  Mary Dougherty is a 48yo female PMH hereditary spherocytosis, well controlled type 2 diabetes mellitus, and breast cancer with recurrence s/p right total mastectomy 10/2017, chemotherapy, and radiation currently on Aromasin and followed by Dr. Lindi Adie. Patient was admitted to [facility] yesterday with persistent abdominal pain. States that she first noticed severe epigastric pain on 11/8 that radiated into her back. She had never had pain like this before. She went to the ED and had an EKG and vital signs, but left because the wait was long and she started to feel better. States that she ate tacos for dinner on Monday night, and early in the morning on Tuesday she was woken up by worsening epigastric pain associated with nausea. Pain radiated down both sides of her abdomen, right worse than left. Denies emesis, fever, chills, diarrhea, constipation.  ED workup included u/s and CT scan that showed pancreatitis, cholelithiasis, and mild splenomegaly. Lab work WBC 11.9, AST 98, ALT 226, Alk phos 136, Tbili 13.3, lipase 2605. GI was consulted and recommended MRCP. This showed acute pancreatitis without evidence of complicating features; no acute cholecystitis; no choledocholithiasis but there is biliary sludge in the common bile duct lying dependently; common bile duct measure 11m. GI has recommended against ERCP at this time. General surgery consulted for consideration of laparoscopic cholecystectomy with IOC. Today Ms. TSlaubaughstates that she is feeling much better. No abdominal pain at rest, but she does still have some epigastric discomfort with palpation. Feeling hungry. LFTs are trending slowly down. Lipase has not been rechecked.  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   HENT: Negative.   Eyes: Negative.   Cardiovascular: Negative.   Gastrointestinal: Positive for abdominal pain and nausea. Negative for constipation, diarrhea and vomiting.  Genitourinary: Negative.   Musculoskeletal: Positive for back pain.  Skin: Negative.   Neurological: Negative.    All systems reviewed and otherwise negative except for as above  Family History  Problem Relation Age of Onset  . Pulmonary embolism Maternal Uncle 436      died instantly  . Melanoma Cousin        paternal cousin dx in her 372s . Heart disease Father   . Diabetes Father   . Thyroid disease Mother     Past Medical History:  Diagnosis Date  . Anemia   . Blood transfusion without reported diagnosis   . Chronic anemia 03/03/13  . Dyspnea    with exertion, during chemo  . Family history of adverse reaction to anesthesia     father- hard to awaken  . Hereditary spherocytosis ([facility]   . Invasive ductal carcinoma of right breast ([facility] 09/2012   ER(99%), PR(100%), Ki-67(70%), 1/5 nodes positive  . Pre-diabetes    off metformin now  . Recurrent breast cancer, right ([facility] 09/2017  . S/P radiation therapy 05/08/2013-06/22/2013   1) Right breast / 50.4 Gy in 28 fractions / 2) Right Supraclavicular fossa/ 50.4 Gy in 28 fractions /3) Right Posterior Axillary boost / 11.9 Gy in 28 fractions /4) Right breast boost / 10 Gy in 5 fractions   . Spherocytosis ([facility] 1989  . Status post chemotherapy 10/07/12 - 12/16/12    Neoadjuvant chemotherapy with dose  dense FEC (5-FU/epirubicin/Cytoxan) from 10/07/2012 through 12/16/12.    Marland Kitchen Syncope and collapse 1989   "thats when I found out I have Spherocytosis"  . Thyroid disease   . Use of exemestane (Aromasin) 07/25/13  . Use of goserelin acetate (Zoladex) 07/25/13  . Wears glasses     Past Surgical History:   Procedure Laterality Date  . BREAST LUMPECTOMY WITH SENTINEL LYMPH NODE BIOPSY Right 04/06/2013   [name]  . BREAST SURGERY     left breast lumpectomy/left ax snbx  . COLONOSCOPY  2015  . MASTECTOMY W/ SENTINEL NODE BIOPSY Right 10/05/2017   Procedure: RIGHT MASTECTOMY WITH RIGHT SENTINEL LYMPH NODE BIOPSY;  Surgeon: [name], MD;  Location: [place];  Service: General;  Laterality: Right;  . PORT-A-CATH REMOVAL N/A 04/06/2013   Procedure: REMOVAL PORT-A-CATH;  Surgeon: [name], MD;  Location: [place];  Service: General;  Laterality: N/A;  . PORT-A-CATH REMOVAL N/A 11/08/2017   Procedure: REMOVAL PORT-A-CATH;  Surgeon: Greer Pickerel, MD;  [facility];  Service: General;  Laterality: N/A;  . PORTACATH PLACEMENT Left 09/20/2012   Procedure: INSERTION PORT-A-CATH;  Surgeon: [name], MD;  Location: [place];  Service: General;  Laterality: Left;  . PORTACATH PLACEMENT Right 05/18/2017   Procedure: INSERTION PORT-A-CATH WITH ULTRASOUND;  Surgeon: [name], MD;  Location: [place];  Service: General;  Laterality: Right;  . ROBOTIC ASSISTED LAPAROSCOPIC LYSIS OF ADHESION  11/08/2017   Procedure: XI ROBOTIC ASSISTED LAPAROSCOPIC LYSIS OF BOWEL ADHESIONS;  Surgeon: Brien Few, MD;  Location[facility];  Service: Gynecology;;  . ROBOTIC ASSISTED TOTAL HYSTERECTOMY WITH BILATERAL SALPINGO OOPHERECTOMY Bilateral 11/08/2017   Procedure: XI ROBOTIC ASSISTED TOTAL HYSTERECTOMY WITH BILATERAL SALPINGO OOPHORECTOMY;  Surgeon: Brien Few, MD;  [facility];  Service: Gynecology;  Laterality: Bilateral;  [place] for 23 hr obs.  . WISDOM TOOTH EXTRACTION      Social History:  reports that she has never smoked. She has never used smokeless tobacco. She reports that she does not drink alcohol or use drugs.  Allergies: No Known Allergies  Medications Prior to Admission  Medication Sig Dispense Refill  .  exemestane (AROMASIN) 25 MG tablet Take 1 tablet (25 mg total) by mouth daily with lunch. 90 tablet 3  . metFORMIN (GLUCOPHAGE) 500 MG tablet Take 1 tablet (500 mg total) by mouth 2 (two) times daily with a meal. 180 tablet 1  . omeprazole (PRILOSEC) 20 MG capsule Take 1 capsule (20 mg total) by mouth daily. 30 capsule 3    Prior to Admission medications   Medication Sig Start Date End Date Taking? Authorizing Provider  exemestane (AROMASIN) 25 MG tablet Take 1 tablet (25 mg total) by mouth daily with lunch. 05/13/18  Yes Magrinat, Virgie Dad, MD  metFORMIN (GLUCOPHAGE) 500 MG tablet Take 1 tablet (500 mg total) by mouth 2 (two) times daily with a meal. 03/26/18  Yes Wendie Agreste, MD  omeprazole (PRILOSEC) 20 MG capsule Take 1 capsule (20 mg total) by mouth daily. 12/12/18   Forrest Moron, MD    Blood pressure 129/66, pulse 75, temperature 98 F (36.7 C), temperature source Oral, resp. rate 18, last menstrual period 10/28/2012, SpO2 98 %. Physical Exam: General: pleasant, WD/WN white female who is laying in bed in NAD HEENT: head is normocephalic, atraumatic.  Scleral icterus.  Pupils equal and round.  Ears and nose without any masses or lesions.  Mouth is pink and moist. Dentition fair Heart: regular,  rate, and rhythm.  No obvious murmurs, gallops, or rubs noted.  Palpable pedal pulses bilaterally Lungs: CTAB, no wheezes, rhonchi, or rales noted.  Respiratory effort nonlabored Abd: multiple well healed lap incisions, soft, nondistended, mild upper abdominal TTP without rebound or guarding, +BS, no masses, hernias, or organomegaly MS: calves soft and nontender without edema Skin: warm and dry. jaundice Psych: A&Ox3 with an appropriate affect. Neuro: cranial nerves grossly intact, extremity CSM intact bilaterally, normal speech  Results for orders placed or performed during the hospital encounter of 12/13/18 (from the past 48 hour(s))  Lipase, blood     Status: Abnormal   Collection  Time: 12/13/18  5:53 AM  Result Value Ref Range   Lipase 2,605 (H) 11 - 51 U/L    Comment: RESULTS CONFIRMED BY MANUAL DILUTION Performed at [facility], [address], [place], [place] [address]   Comprehensive metabolic panel     Status: Abnormal   Collection Time: 12/13/18  5:53 AM  Result Value Ref Range   Sodium 135 135 - 145 mmol/L   Potassium 3.7 3.5 - 5.1 mmol/L   Chloride 104 98 - 111 mmol/L   CO2 21 (L) 22 - 32 mmol/L   Glucose, Bld 245 (H) 70 - 99 mg/dL   BUN 9 6 - 20 mg/dL   Creatinine, Ser 0.65 0.44 - 1.00 mg/dL   Calcium 9.1 8.9 - 10.3 mg/dL   Total Protein 7.4 6.5 - 8.1 g/dL   Albumin 3.9 3.5 - 5.0 g/dL   AST 98 (H) 15 - 41 U/L   ALT 226 (H) 0 - 44 U/L   Alkaline Phosphatase 136 (H) 38 - 126 U/L   Total Bilirubin 13.3 (H) 0.3 - 1.2 mg/dL   GFR calc non Af Amer >60 >60 mL/min   GFR calc Af Amer >60 >60 mL/min   Anion gap 10 5 - 15    Comment: Performed at [facility], [place] [address] [place], [place] [address]  CBC     Status: Abnormal   Collection Time: 12/13/18  5:53 AM  Result Value Ref Range   WBC 11.9 (H) 4.0 - 10.5 K/uL   RBC 3.61 (L) 3.87 - 5.11 MIL/uL   Hemoglobin 11.6 (L) 12.0 - 15.0 g/dL   HCT 33.2 (L) 36.0 - 46.0 %   MCV 92.0 80.0 - 100.0 fL   MCH 32.1 26.0 - 34.0 pg   MCHC 34.9 30.0 - 36.0 g/dL   RDW 17.6 (H) 11.5 - 15.5 %   Platelets 164 150 - 400 K/uL   nRBC 0.0 0.0 - 0.2 %    Comment: Performed at [facility], [place] [address] [place], [place] [address]  I-Stat beta hCG blood, ED     Status: None   Collection Time: 12/13/18  6:21 AM  Result Value Ref Range   I-stat hCG, quantitative <5.0 <5 mIU/mL   Comment 3            Comment:   GEST. AGE      CONC.  (mIU/mL)   <=1 WEEK        5 - 50     2 WEEKS       50 - 500     3 WEEKS       100 - 10,000     4 WEEKS     1,000 - 30,000        FEMALE AND NON-PREGNANT FEMALE:     LESS THAN  5 mIU/mL   Lactic acid, plasma     Status: None   Collection Time: 12/13/18  7:53 AM   Result Value Ref Range   Lactic Acid, Venous 1.2 0.5 - 1.9 mmol/L    Comment: Performed at [facility] [address], [place], [place] [address]  Magnesium     Status: None   Collection Time: 12/13/18  7:54 AM  Result Value Ref Range   Magnesium 1.8 1.7 - 2.4 mg/dL    Comment: Performed at [facility] [address], [place], [place] [address]  Phosphorus     Status: None   Collection Time: 12/13/18  7:54 AM  Result Value Ref Range   Phosphorus 4.2 2.5 - 4.6 mg/dL    Comment: Performed at [facility] [address], [place], [place] [address]  HIV Antibody (routine testing w rflx)     Status: None   Collection Time: 12/13/18  7:54 AM  Result Value Ref Range   HIV Screen 4th Generation wRfx NON REACTIVE NON REACTIVE    Comment: Performed at [facility] [address], [place], [place] [address]  Blood culture (routine x 2)     Status: None (Preliminary result)   Collection Time: 12/13/18  8:30 AM   Specimen: BLOOD LEFT HAND  Result Value Ref Range   Specimen Description BLOOD LEFT HAND    Special Requests      BOTTLES DRAWN AEROBIC AND ANAEROBIC Blood Culture results may not be optimal due to an inadequate volume of blood received in culture bottles   Culture      NO GROWTH < 24 HOURS Performed at [facility] [address] [place], [place] [address]    Report Status PENDING   SARS CORONAVIRUS 2 (TAT 6-24 HRS) Nasopharyngeal Nasopharyngeal Swab     Status: None   Collection Time: 12/13/18  8:30 AM   Specimen: Nasopharyngeal Swab  Result Value Ref Range   SARS Coronavirus 2 NEGATIVE NEGATIVE    Comment: (NOTE) SARS-CoV-2 target nucleic acids are NOT DETECTED. The SARS-CoV-2 RNA is generally detectable in upper and lower respiratory specimens during the acute phase of infection. Negative results do not preclude SARS-CoV-2 infection, do not rule out co-infections with other pathogens, and should not be used as the sole basis for  treatment or other patient management decisions. Negative results must be combined with clinical observations, patient history, and epidemiological information. The expected result is Negative. Fact Sheet for Patients: SugarRoll.be Fact Sheet for Healthcare Providers: https://www.woods-mathews.com/ This test is not yet approved or cleared by the [place] FDA and  has been authorized for detection and/or diagnosis of SARS-CoV-2 by FDA under an Emergency Use Authorization (EUA). This EUA will remain  in effect (meaning this test can be used) for the duration of the COVID-19 declaration under Section 56 4(b)(1) of the Act, 21 U.S.C. section 360bbb-3(b)(1), unless the authorization is terminated or revoked sooner. Performed at [facility], [place] [address] [place], [place] [address]   Urinalysis, Routine w reflex microscopic     Status: Abnormal   Collection Time: 12/13/18  8:45 AM  Result Value Ref Range   Color, Urine AMBER (A) YELLOW    Comment: BIOCHEMICALS MAY BE AFFECTED BY COLOR   APPearance CLEAR CLEAR   Specific Gravity, Urine 1.019 1.005 - 1.030   pH 5.0 5.0 - 8.0   Glucose, UA NEGATIVE NEGATIVE mg/dL   Hgb urine dipstick NEGATIVE NEGATIVE  Bilirubin Urine SMALL (A) NEGATIVE   Ketones, ur NEGATIVE NEGATIVE mg/dL   Protein, ur NEGATIVE NEGATIVE mg/dL   Nitrite NEGATIVE NEGATIVE   Leukocytes,Ua MODERATE (A) NEGATIVE   WBC, UA 0-5 0 - 5 WBC/hpf   Bacteria, UA RARE (A) NONE SEEN   Squamous Epithelial / LPF 0-5 0 - 5    Comment: Performed at [facility], [place] [address] [place], [place] [address]  Blood culture (routine x 2)     Status: None (Preliminary result)   Collection Time: 12/13/18  8:53 AM   Specimen: BLOOD LEFT FOREARM  Result Value Ref Range   Specimen Description BLOOD LEFT FOREARM    Special Requests      BOTTLES DRAWN AEROBIC AND ANAEROBIC Blood Culture adequate volume   Culture      NO GROWTH < 24  HOURS Performed at [facility], [place] [address] [place], [place] [address]    Report Status PENDING   CBG monitoring, ED     Status: Abnormal   Collection Time: 12/13/18 12:12 PM  Result Value Ref Range   Glucose-Capillary 120 (H) 70 - 99 mg/dL   Comment 1 Document in Chart   Lactic acid, plasma     Status: None   Collection Time: 12/13/18  5:50 PM  Result Value Ref Range   Lactic Acid, Venous 0.8 0.5 - 1.9 mmol/L    Comment: Performed at [facility] [address] [place], [place] [address]  Glucose, capillary     Status: None   Collection Time: 12/13/18  5:52 PM  Result Value Ref Range   Glucose-Capillary 94 70 - 99 mg/dL  Glucose, capillary     Status: None   Collection Time: 12/14/18 12:00 AM  Result Value Ref Range   Glucose-Capillary 83 70 - 99 mg/dL  Comprehensive metabolic panel     Status: Abnormal   Collection Time: 12/14/18  5:15 AM  Result Value Ref Range   Sodium 139 135 - 145 mmol/L   Potassium 3.5 3.5 - 5.1 mmol/L   Chloride 105 98 - 111 mmol/L   CO2 22 22 - 32 mmol/L   Glucose, Bld 89 70 - 99 mg/dL   BUN 5 (L) 6 - 20 mg/dL   Creatinine, Ser 0.64 0.44 - 1.00 mg/dL   Calcium 8.9 8.9 - 10.3 mg/dL   Total Protein 6.8 6.5 - 8.1 g/dL   Albumin 3.4 (L) 3.5 - 5.0 g/dL   AST 57 (H) 15 - 41 U/L   ALT 159 (H) 0 - 44 U/L   Alkaline Phosphatase 121 38 - 126 U/L   Total Bilirubin 13.1 (H) 0.3 - 1.2 mg/dL   GFR calc non Af Amer >60 >60 mL/min   GFR calc Af Amer >60 >60 mL/min   Anion gap 12 5 - 15    Comment: Performed at [facility], [place] [address], [place],  [address]  CBC     Status: Abnormal   Collection Time: 12/14/18  5:15 AM  Result Value Ref Range   WBC 9.4 4.0 - 10.5 K/uL   RBC 3.40 (L) 3.87 - 5.11 MIL/uL   Hemoglobin 11.1 (L) 12.0 - 15.0 g/dL   HCT 31.8 (L) 36.0 - 46.0 %   MCV 93.5 80.0 - 100.0 fL   MCH 32.6 26.0 - 34.0 pg   MCHC 34.9 30.0 - 36.0 g/dL   RDW 17.7 (H) 11.5 - 15.5 %   Platelets 146 (L) 150 -  400 K/uL   nRBC  0.0 0.0 - 0.2 %    Comment: Performed at [facility], [place] [address], [place], [place] [address]  Hemoglobin A1c     Status: None   Collection Time: 12/14/18  5:15 AM  Result Value Ref Range   Hgb A1c MFr Bld 5.1 4.8 - 5.6 %    Comment: (NOTE) Pre diabetes:          5.7%-6.4% Diabetes:              >6.4% Glycemic control for   <7.0% adults with diabetes    Mean Plasma Glucose 99.67 mg/dL    Comment: Performed at [facility] [address] [place], [place] [address]  Glucose, capillary     Status: None   Collection Time: 12/14/18  5:27 AM  Result Value Ref Range   Glucose-Capillary 85 70 - 99 mg/dL  Glucose, capillary     Status: None   Collection Time: 12/14/18 11:30 AM  Result Value Ref Range   Glucose-Capillary 72 70 - 99 mg/dL   Comment 1 Notify RN    Ct Abdomen Pelvis W Contrast  Result Date: 12/13/2018 CLINICAL DATA:  Epigastric pain, episode Saturday, resolved, recurrent today at 0300 hours, associated jaundice, lower abdominal pain, leukocytosis, history breast cancer in 2014 with recurrence in 2019 EXAM: CT ABDOMEN AND PELVIS WITH CONTRAST TECHNIQUE: Multidetector CT imaging of the abdomen and pelvis was performed using the standard protocol following bolus administration of intravenous contrast. Sagittal and coronal MPR images reconstructed from axial data set. CONTRAST:  176m OMNIPAQUE IOHEXOL 300 MG/ML SOLN IV. No oral contrast. COMPARISON:  10/26/2012 FINDINGS: Lower chest: Lung bases clear Hepatobiliary: Minimal dependent density in gallbladder suspect tiny calculi. No definite gallbladder wall thickening. Liver unremarkable. No biliary dilatation or biliary air. Pancreas: Edema identified adjacent to pancreatic tail most consistent with pancreatitis. Edema is also seen extending into the small bowel mesentery inferior to the stomach and posterior to the transverse colon. No focal fluid collection. No pancreatic hemorrhage or necrosis. Spleen: Enlarged, 13.7 x  15.2 x 5.1 cm (volume = 560 cm^3). No focal lesions. Small splenule. Adrenals/Urinary Tract: Tiny cyst at upper pole LEFT kidney. Adrenal glands, kidneys, ureters, and bladder otherwise normal appearance. Stomach/Bowel: Minimal diverticulosis of descending and sigmoid colon. Normal appendix. Stomach and bowel loops otherwise normal appearance. Vascular/Lymphatic: Minimal atherosclerotic calcification aorta. Aorta normal caliber. No adenopathy. Portal, splenic, and superior mesenteric veins patent. Reproductive: Uterus surgically absent with nonvisualization of ovaries Other: No free air or free fluid. Tiny umbilical hernia containing fat. Musculoskeletal: No acute osseous findings. IMPRESSION: Edema adjacent to the pancreatic tail consistent with pancreatitis; recommend correlation with serum amylase/lipase. No evidence of pancreatic hemorrhage or necrosis. Mild splenomegaly. Suspectedcholelithiasis. Minimal distal colonic diverticulosis. Aortic Atherosclerosis (ICD10-I70.0). Electronically Signed   By: MLavonia DanaM.D.   On: 12/13/2018 08:29   Mr 3d Recon At Scanner  Result Date: 12/13/2018 CLINICAL DATA:  48year old female with history of pancreatitis. Epigastric pain. Evaluate for potential choledocholithiasis. EXAM: MRI ABDOMEN WITHOUT AND WITH CONTRAST (INCLUDING MRCP) TECHNIQUE: Multiplanar multisequence MR imaging of the abdomen was performed both before and after the administration of intravenous contrast. Heavily T2-weighted images of the biliary and pancreatic ducts were obtained, and three-dimensional MRCP images were rendered by post processing. CONTRAST:  715mGADAVIST GADOBUTROL 1 MMOL/ML IV SOLN COMPARISON:  No prior abdominal MRI. CT the abdomen and pelvis 12/05/2018. FINDINGS: Lower chest: Cardiomegaly. Postoperative changes of right-sided modified radical mastectomy.  Hepatobiliary: Diffuse loss of signal intensity throughout the hepatic parenchyma on out of phase dual echo images, indicative  of hepatic steatosis. No suspicious cystic or solid hepatic lesions. No intra or extrahepatic biliary ductal dilatation noted on MRCP images. Common bile duct measures 6 mm in the porta hepatis. No well-defined filling defects in the common bile duct to suggest choledocholithiasis. However, there is amorphous T2 hypointensity the lying dependently in the common bile duct which corresponds to T1 hyperintensity lying dependently in the common bile duct, compatible with biliary sludge in the duct. There is some ill-defined low T2 signal and high T1 intensity material lying dependently in the gallbladder which may reflect a small amount of biliary sludge and/or small gallstones. Gallbladder does not appear distended. No gallbladder wall thickening or significant volume of pericholecystic fluid. Pancreas: Extensive inflammatory changes are noted within and around the pancreas, compatible with an acute pancreatitis. No discrete pancreatic mass. Although there is extensive peripancreatic fluid, no well-defined fluid collection is confidently identified to clearly indicate the presence of a pancreatic pseudocyst. The pancreatic parenchyma appears to enhance normally. No pancreatic ductal dilatation noted on MRCP images. Spleen: The spleen is enlarged measuring 15.1 x 6.4 x 15.0 cm (estimated splenic volume of 725 mL). In addition, the spleen demonstrates diffuse low signal intensity on T2 weighted images, and loss of signal intensity on in phase dual echo images, indicative of significant iron deposition. Adrenals/Urinary Tract: In the upper pole of the left kidney there is an exophytic T1 hypointense, T2 hyperintense, nonenhancing lesion, compatible with a simple cyst. Right kidney and bilateral adrenal glands are normal in appearance. Stomach/Bowel: T2 hyperintense fluid adjacent to portions of the distal stomach and duodenum, presumably related to the adjacent inflammation in the pancreas. Otherwise, visualized portions  are unremarkable. Vascular/Lymphatic: No aneurysm identified in the visualized abdominal vasculature. Splenic vein, superior mesenteric vein and portal vein all remain patent at this time. No lymphadenopathy noted in the visualized abdomen. Other: Inflammatory fluid throughout the retroperitoneum most evident adjacent to the pancreas. No significant volume of ascites in the visualized portions of the peritoneal cavity. Musculoskeletal: No aggressive appearing osseous lesions are noted in the visualized portions of the skeleton. IMPRESSION: 1. Acute pancreatitis, without evidence of pancreatic necrosis, pancreatic pseudocyst or other acute complicating features. 2. Biliary sludge and/or small gallstones lying dependently in the gallbladder. No findings to suggest an acute cholecystitis at this time. 3. Although there is no choledocholithiasis, there is clearly biliary sludge in the common bile duct lying dependently. Despite this, there is no evidence of biliary tract obstruction at this time. Common bile duct measures 6 mm in the porta hepatis. 4. Hepatic steatosis. 5. Iron deposition in the spleen. 6. Cardiomegaly. Electronically Signed   By: Vinnie Langton M.D.   On: 12/13/2018 17:16   Mr Abdomen Mrcp Moise Boring Contast  Result Date: 12/13/2018 CLINICAL DATA:  48 year old female with history of pancreatitis. Epigastric pain. Evaluate for potential choledocholithiasis. EXAM: MRI ABDOMEN WITHOUT AND WITH CONTRAST (INCLUDING MRCP) TECHNIQUE: Multiplanar multisequence MR imaging of the abdomen was performed both before and after the administration of intravenous contrast. Heavily T2-weighted images of the biliary and pancreatic ducts were obtained, and three-dimensional MRCP images were rendered by post processing. CONTRAST:  57m GADAVIST GADOBUTROL 1 MMOL/ML IV SOLN COMPARISON:  No prior abdominal MRI. CT the abdomen and pelvis 12/05/2018. FINDINGS: Lower chest: Cardiomegaly. Postoperative changes of right-sided  modified radical mastectomy. Hepatobiliary: Diffuse loss of signal intensity throughout the hepatic parenchyma on out of  phase dual echo images, indicative of hepatic steatosis. No suspicious cystic or solid hepatic lesions. No intra or extrahepatic biliary ductal dilatation noted on MRCP images. Common bile duct measures 6 mm in the porta hepatis. No well-defined filling defects in the common bile duct to suggest choledocholithiasis. However, there is amorphous T2 hypointensity the lying dependently in the common bile duct which corresponds to T1 hyperintensity lying dependently in the common bile duct, compatible with biliary sludge in the duct. There is some ill-defined low T2 signal and high T1 intensity material lying dependently in the gallbladder which may reflect a small amount of biliary sludge and/or small gallstones. Gallbladder does not appear distended. No gallbladder wall thickening or significant volume of pericholecystic fluid. Pancreas: Extensive inflammatory changes are noted within and around the pancreas, compatible with an acute pancreatitis. No discrete pancreatic mass. Although there is extensive peripancreatic fluid, no well-defined fluid collection is confidently identified to clearly indicate the presence of a pancreatic pseudocyst. The pancreatic parenchyma appears to enhance normally. No pancreatic ductal dilatation noted on MRCP images. Spleen: The spleen is enlarged measuring 15.1 x 6.4 x 15.0 cm (estimated splenic volume of 725 mL). In addition, the spleen demonstrates diffuse low signal intensity on T2 weighted images, and loss of signal intensity on in phase dual echo images, indicative of significant iron deposition. Adrenals/Urinary Tract: In the upper pole of the left kidney there is an exophytic T1 hypointense, T2 hyperintense, nonenhancing lesion, compatible with a simple cyst. Right kidney and bilateral adrenal glands are normal in appearance. Stomach/Bowel: T2 hyperintense  fluid adjacent to portions of the distal stomach and duodenum, presumably related to the adjacent inflammation in the pancreas. Otherwise, visualized portions are unremarkable. Vascular/Lymphatic: No aneurysm identified in the visualized abdominal vasculature. Splenic vein, superior mesenteric vein and portal vein all remain patent at this time. No lymphadenopathy noted in the visualized abdomen. Other: Inflammatory fluid throughout the retroperitoneum most evident adjacent to the pancreas. No significant volume of ascites in the visualized portions of the peritoneal cavity. Musculoskeletal: No aggressive appearing osseous lesions are noted in the visualized portions of the skeleton. IMPRESSION: 1. Acute pancreatitis, without evidence of pancreatic necrosis, pancreatic pseudocyst or other acute complicating features. 2. Biliary sludge and/or small gallstones lying dependently in the gallbladder. No findings to suggest an acute cholecystitis at this time. 3. Although there is no choledocholithiasis, there is clearly biliary sludge in the common bile duct lying dependently. Despite this, there is no evidence of biliary tract obstruction at this time. Common bile duct measures 6 mm in the porta hepatis. 4. Hepatic steatosis. 5. Iron deposition in the spleen. 6. Cardiomegaly. Electronically Signed   By: Vinnie Langton M.D.   On: 12/13/2018 17:16   US Abdomen Limited Ruq  Result Date: 12/13/2018 CLINICAL DATA:  Onset right upper quadrant pain at 3 a.m. last night. EXAM: ULTRASOUND ABDOMEN LIMITED RIGHT UPPER QUADRANT COMPARISON:  None. FINDINGS: Gallbladder: There is sludge within the gallbladder. There may be a few small stones present. Multiple punctate echogenic foci in the non dependent gallbladder are also seen. No wall thickening or pericholecystic fluid. Common bile duct: Diameter: 0.6 cm Liver: Echogenicity is increased in echotexture is heterogeneous. No focal lesion. Portal vein is patent on color  Doppler imaging with normal direction of blood flow towards the liver. Other: None. IMPRESSION: Sludge and possibly small stones within the gallbladder. Nondependent echogenic foci in the gallbladder worrisome for the presence of intraluminal gas. CT scan with contrast is recommended for further evaluation.  Electronically Signed   By: Inge Rise M.D.   On: 12/13/2018 06:57    Anti-infectives (From admission, onward)   Start     Dose/Rate Route Frequency Ordered Stop   12/13/18 1700  piperacillin-tazobactam (ZOSYN) IVPB 3.375 g     3.375 g 12.5 mL/hr over 240 Minutes Intravenous Every 8 hours 12/13/18 0945     12/13/18 1400  piperacillin-tazobactam (ZOSYN) IVPB 3.375 g  Status:  Discontinued     3.375 g 100 mL/hr over 30 Minutes Intravenous Every 8 hours 12/13/18 0940 12/13/18 0945   12/13/18 0800  piperacillin-tazobactam (ZOSYN) IVPB 3.375 g     3.375 g 100 mL/hr over 30 Minutes Intravenous  Once 12/13/18 0745 12/13/18 0916       Assessment/Plan Hereditary spherocytosis Well controlled type 2 diabetes mellitus Breast cancer with recurrence s/p right total mastectomy 10/2017, chemotherapy, and radiation currently on Aromasin and followed by Dr. Lindi Adie.  Elevated LFTs Gallstone pancreatitis - Patient with suspected gallstone pancreatitis. MRCP did not show choledocholithiasis but there is biliary sludge in the common bile duct lying dependently, and common bile duct measures 29m. LFTs are trending slowly down. GI recommending against ERCP at this time.  Will plan for laparoscopic cholecystectomy with IOC once pancreatitis resolves. Will make NPO after midnight and repeat labs in AM.  ID - zosyn 11/10>> VTE - SCDs, lovenox FEN - IVF, CLD, NPO after MN Foley - none Follow up - TBD  BWellington Hampshire PThe [facility] 12/14/2018, 11:41 AM Please see Amion for pager number during day hours 7:00am-4:30pm

## 2018-12-14 NOTE — Progress Notes (Addendum)
[facility] Progress Note  CC:  Gallstone pancreatitis  Subjective:  Feeling some better this AM.  Total bili 13.1, ALP normal, AST 57, ALT 159  IMPRESSION: 1. Acute pancreatitis, without evidence of pancreatic necrosis, pancreatic pseudocyst or other acute complicating features. 2. Biliary sludge and/or small gallstones lying dependently in the gallbladder. No findings to suggest an acute cholecystitis at this time. 3. Although there is no choledocholithiasis, there is clearly biliary sludge in the common bile duct lying dependently. Despite this, there is no evidence of biliary tract obstruction at this time. Common bile duct measures 6 mm in the porta hepatis. 4. Hepatic steatosis. 5. Iron deposition in the spleen. 6. Cardiomegaly.  Objective:  Vital signs in last 24 hours: Temp:  [98 F (36.7 C)-98.6 F (37 C)] 98 F (36.7 C) (11/11 0500) Pulse Rate:  [66-75] 75 (11/11 0500) Resp:  [16-18] 18 (11/11 0500) BP: (102-129)/(54-66) 129/66 (11/11 0500) SpO2:  [95 %-98 %] 98 % (11/11 0500) Last BM Date: 12/13/18 General:  Alert, Well-developed, in NAD Heart:  Regular rate and rhythm; no murmurs Pulm:  CTAB.  No increased WOB. Abdomen:  Soft, non-distended.  BS present.  Moderate epigastric TTP. Extremities:  Without edema. Neurologic:  Alert and oriented x 4;  grossly normal neurologically. Psych:  Alert and cooperative. Normal mood and affect.  Intake/Output from previous day: 11/10 0701 - 11/11 0700 In: 2837.2 [I.V.:1787.2; IV Piggyback:1050] Out: -   Lab Results: Recent Labs    12/11/18 1743 12/13/18 0553 12/14/18 0515  WBC 12.9* 11.9* 9.4  HGB 11.1* 11.6* 11.1*  HCT 31.5* 33.2* 31.8*  PLT 174 164 146*   BMET Recent Labs    12/12/18 1712 12/13/18 0553 12/14/18 0515  NA 139 135 139  K 3.9 3.7 3.5  CL 102 104 105  CO2 22 21* 22  GLUCOSE 159* 245* 89  BUN 9 9 5*  CREATININE 0.71 0.65 0.64  CALCIUM 9.2 9.1 8.9   LFT Recent Labs      12/14/18 0515  PROT 6.8  ALBUMIN 3.4*  AST 57*  ALT 159*  ALKPHOS 121  BILITOT 13.1*   Ct Abdomen Pelvis W Contrast  Result Date: 12/13/2018 CLINICAL DATA:  Epigastric pain, episode Saturday, resolved, recurrent today at 0300 hours, associated jaundice, lower abdominal pain, leukocytosis, history breast cancer in 2014 with recurrence in 2019 EXAM: CT ABDOMEN AND PELVIS WITH CONTRAST TECHNIQUE: Multidetector CT imaging of the abdomen and pelvis was performed using the standard protocol following bolus administration of intravenous contrast. Sagittal and coronal MPR images reconstructed from axial data set. CONTRAST:  111mL OMNIPAQUE IOHEXOL 300 MG/ML SOLN IV. No oral contrast. COMPARISON:  10/26/2012 FINDINGS: Lower chest: Lung bases clear Hepatobiliary: Minimal dependent density in gallbladder suspect tiny calculi. No definite gallbladder wall thickening. Liver unremarkable. No biliary dilatation or biliary air. Pancreas: Edema identified adjacent to pancreatic tail most consistent with pancreatitis. Edema is also seen extending into the small bowel mesentery inferior to the stomach and posterior to the transverse colon. No focal fluid collection. No pancreatic hemorrhage or necrosis. Spleen: Enlarged, 13.7 x 15.2 x 5.1 cm (volume = 560 cm^3). No focal lesions. Small splenule. Adrenals/Urinary Tract: Tiny cyst at upper pole LEFT kidney. Adrenal glands, kidneys, ureters, and bladder otherwise normal appearance. Stomach/Bowel: Minimal diverticulosis of descending and sigmoid colon. Normal appendix. Stomach and bowel loops otherwise normal appearance. Vascular/Lymphatic: Minimal atherosclerotic calcification aorta. Aorta normal caliber. No adenopathy. Portal, splenic, and superior mesenteric veins patent. Reproductive: Uterus surgically absent  with nonvisualization of ovaries Other: No free air or free fluid. Tiny umbilical hernia containing fat. Musculoskeletal: No acute osseous findings. IMPRESSION:  Edema adjacent to the pancreatic tail consistent with pancreatitis; recommend correlation with serum amylase/lipase. No evidence of pancreatic hemorrhage or necrosis. Mild splenomegaly. Suspectedcholelithiasis. Minimal distal colonic diverticulosis. Aortic Atherosclerosis (ICD10-I70.0). Electronically Signed   By: Lavonia Dana M.D.   On: 12/13/2018 08:29   Mr 3d Recon At Scanner  Result Date: 12/13/2018 CLINICAL DATA:  48 year old female with history of pancreatitis. Epigastric pain. Evaluate for potential choledocholithiasis. EXAM: MRI ABDOMEN WITHOUT AND WITH CONTRAST (INCLUDING MRCP) TECHNIQUE: Multiplanar multisequence MR imaging of the abdomen was performed both before and after the administration of intravenous contrast. Heavily T2-weighted images of the biliary and pancreatic ducts were obtained, and three-dimensional MRCP images were rendered by post processing. CONTRAST:  34mL GADAVIST GADOBUTROL 1 MMOL/ML IV SOLN COMPARISON:  No prior abdominal MRI. CT the abdomen and pelvis 12/05/2018. FINDINGS: Lower chest: Cardiomegaly. Postoperative changes of right-sided modified radical mastectomy. Hepatobiliary: Diffuse loss of signal intensity throughout the hepatic parenchyma on out of phase dual echo images, indicative of hepatic steatosis. No suspicious cystic or solid hepatic lesions. No intra or extrahepatic biliary ductal dilatation noted on MRCP images. Common bile duct measures 6 mm in the porta hepatis. No well-defined filling defects in the common bile duct to suggest choledocholithiasis. However, there is amorphous T2 hypointensity the lying dependently in the common bile duct which corresponds to T1 hyperintensity lying dependently in the common bile duct, compatible with biliary sludge in the duct. There is some ill-defined low T2 signal and high T1 intensity material lying dependently in the gallbladder which may reflect a small amount of biliary sludge and/or small gallstones. Gallbladder does not  appear distended. No gallbladder wall thickening or significant volume of pericholecystic fluid. Pancreas: Extensive inflammatory changes are noted within and around the pancreas, compatible with an acute pancreatitis. No discrete pancreatic mass. Although there is extensive peripancreatic fluid, no well-defined fluid collection is confidently identified to clearly indicate the presence of a pancreatic pseudocyst. The pancreatic parenchyma appears to enhance normally. No pancreatic ductal dilatation noted on MRCP images. Spleen: The spleen is enlarged measuring 15.1 x 6.4 x 15.0 cm (estimated splenic volume of 725 mL). In addition, the spleen demonstrates diffuse low signal intensity on T2 weighted images, and loss of signal intensity on in phase dual echo images, indicative of significant iron deposition. Adrenals/Urinary Tract: In the upper pole of the left kidney there is an exophytic T1 hypointense, T2 hyperintense, nonenhancing lesion, compatible with a simple cyst. Right kidney and bilateral adrenal glands are normal in appearance. Stomach/Bowel: T2 hyperintense fluid adjacent to portions of the distal stomach and duodenum, presumably related to the adjacent inflammation in the pancreas. Otherwise, visualized portions are unremarkable. Vascular/Lymphatic: No aneurysm identified in the visualized abdominal vasculature. Splenic vein, superior mesenteric vein and portal vein all remain patent at this time. No lymphadenopathy noted in the visualized abdomen. Other: Inflammatory fluid throughout the retroperitoneum most evident adjacent to the pancreas. No significant volume of ascites in the visualized portions of the peritoneal cavity. Musculoskeletal: No aggressive appearing osseous lesions are noted in the visualized portions of the skeleton. IMPRESSION: 1. Acute pancreatitis, without evidence of pancreatic necrosis, pancreatic pseudocyst or other acute complicating features. 2. Biliary sludge and/or small  gallstones lying dependently in the gallbladder. No findings to suggest an acute cholecystitis at this time. 3. Although there is no choledocholithiasis, there is clearly biliary sludge in the  common bile duct lying dependently. Despite this, there is no evidence of biliary tract obstruction at this time. Common bile duct measures 6 mm in the porta hepatis. 4. Hepatic steatosis. 5. Iron deposition in the spleen. 6. Cardiomegaly. Electronically Signed   By: Vinnie Langton M.D.   On: 12/13/2018 17:16   Mr Abdomen Mrcp Moise Boring Contast  Result Date: 12/13/2018 CLINICAL DATA:  48 year old female with history of pancreatitis. Epigastric pain. Evaluate for potential choledocholithiasis. EXAM: MRI ABDOMEN WITHOUT AND WITH CONTRAST (INCLUDING MRCP) TECHNIQUE: Multiplanar multisequence MR imaging of the abdomen was performed both before and after the administration of intravenous contrast. Heavily T2-weighted images of the biliary and pancreatic ducts were obtained, and three-dimensional MRCP images were rendered by post processing. CONTRAST:  87mL GADAVIST GADOBUTROL 1 MMOL/ML IV SOLN COMPARISON:  No prior abdominal MRI. CT the abdomen and pelvis 12/05/2018. FINDINGS: Lower chest: Cardiomegaly. Postoperative changes of right-sided modified radical mastectomy. Hepatobiliary: Diffuse loss of signal intensity throughout the hepatic parenchyma on out of phase dual echo images, indicative of hepatic steatosis. No suspicious cystic or solid hepatic lesions. No intra or extrahepatic biliary ductal dilatation noted on MRCP images. Common bile duct measures 6 mm in the porta hepatis. No well-defined filling defects in the common bile duct to suggest choledocholithiasis. However, there is amorphous T2 hypointensity the lying dependently in the common bile duct which corresponds to T1 hyperintensity lying dependently in the common bile duct, compatible with biliary sludge in the duct. There is some ill-defined low T2 signal and high  T1 intensity material lying dependently in the gallbladder which may reflect a small amount of biliary sludge and/or small gallstones. Gallbladder does not appear distended. No gallbladder wall thickening or significant volume of pericholecystic fluid. Pancreas: Extensive inflammatory changes are noted within and around the pancreas, compatible with an acute pancreatitis. No discrete pancreatic mass. Although there is extensive peripancreatic fluid, no well-defined fluid collection is confidently identified to clearly indicate the presence of a pancreatic pseudocyst. The pancreatic parenchyma appears to enhance normally. No pancreatic ductal dilatation noted on MRCP images. Spleen: The spleen is enlarged measuring 15.1 x 6.4 x 15.0 cm (estimated splenic volume of 725 mL). In addition, the spleen demonstrates diffuse low signal intensity on T2 weighted images, and loss of signal intensity on in phase dual echo images, indicative of significant iron deposition. Adrenals/Urinary Tract: In the upper pole of the left kidney there is an exophytic T1 hypointense, T2 hyperintense, nonenhancing lesion, compatible with a simple cyst. Right kidney and bilateral adrenal glands are normal in appearance. Stomach/Bowel: T2 hyperintense fluid adjacent to portions of the distal stomach and duodenum, presumably related to the adjacent inflammation in the pancreas. Otherwise, visualized portions are unremarkable. Vascular/Lymphatic: No aneurysm identified in the visualized abdominal vasculature. Splenic vein, superior mesenteric vein and portal vein all remain patent at this time. No lymphadenopathy noted in the visualized abdomen. Other: Inflammatory fluid throughout the retroperitoneum most evident adjacent to the pancreas. No significant volume of ascites in the visualized portions of the peritoneal cavity. Musculoskeletal: No aggressive appearing osseous lesions are noted in the visualized portions of the skeleton. IMPRESSION: 1.  Acute pancreatitis, without evidence of pancreatic necrosis, pancreatic pseudocyst or other acute complicating features. 2. Biliary sludge and/or small gallstones lying dependently in the gallbladder. No findings to suggest an acute cholecystitis at this time. 3. Although there is no choledocholithiasis, there is clearly biliary sludge in the common bile duct lying dependently. Despite this, there is no evidence of biliary tract  obstruction at this time. Common bile duct measures 6 mm in the porta hepatis. 4. Hepatic steatosis. 5. Iron deposition in the spleen. 6. Cardiomegaly. Electronically Signed   By: Vinnie Langton M.D.   On: 12/13/2018 17:16   US Abdomen Limited Ruq  Result Date: 12/13/2018 CLINICAL DATA:  Onset right upper quadrant pain at 3 a.m. last night. EXAM: ULTRASOUND ABDOMEN LIMITED RIGHT UPPER QUADRANT COMPARISON:  None. FINDINGS: Gallbladder: There is sludge within the gallbladder. There may be a few small stones present. Multiple punctate echogenic foci in the non dependent gallbladder are also seen. No wall thickening or pericholecystic fluid. Common bile duct: Diameter: 0.6 cm Liver: Echogenicity is increased in echotexture is heterogeneous. No focal lesion. Portal vein is patent on color Doppler imaging with normal direction of blood flow towards the liver. Other: None. IMPRESSION: Sludge and possibly small stones within the gallbladder. Nondependent echogenic foci in the gallbladder worrisome for the presence of intraluminal gas. CT scan with contrast is recommended for further evaluation. Electronically Signed   By: Inge Rise M.D.   On: 12/13/2018 06:57   Assessment / Plan: 1.  Elevated LFTs in the setting of cholelithiasis and pancreatitis: Bilirubin still 13.1 this AM, but all other LFT's down-trending , ultrasound with sludge and possibly small stones in the gallbladder and nondependent echogenic foci in the gallbladder worrisome for the presence of intraluminal gas, CT  with edema adjacent to the pancreatic tail consistent with pancreatitis, no evidence of pancreatic hemorrhage or necrosis, mild splenomegaly, suspected cholelithiasis concern for choledocholithiasis.  MRCP shows biliary sludge in the CBD but no definite stones or sign of obstruction, CBD normal at 6 mm. 2.  Leukocytosis:  Resolved 3.  History of breast cancer  -I consulted surgery.  Dr. Henrene Pastor is recommending lap chole with IOC when pancreatitis improved per surgery.  Only plan for ERCP if IOC is positive.  Continue to trend LFT's.   LOS: 1 day   Laban Emperor. Zehr  12/14/2018, 9:36 AM  GI ATTENDING  History and interval data reviewed.  Patient personally seen and examined.  Niece in room.  Patient is feeling better.  Hungry.  Liver tests are improving with disproportionate elevation of bilirubin noted.  MRCP does not show evidence of obstruction or definitive stone.  Question sludge.  I would advocate for proceeding with laparoscopic cholecystectomy with IOC when clinically appropriate.  Should definitive common duct stone be identified, then postoperative ERCP would be in order.  I discussed this with the patient in detail.  Continue to trend laboratories and clinical progress.  Docia Chuck. Geri Seminole., M.D. [facility] Division of Gastroenterology

## 2018-12-14 NOTE — Progress Notes (Signed)
PROGRESS NOTE    [name]  [id number] DOB: January 28, 1971 DOA: 12/13/2018 PCP: Forrest Moron, MD   Brief Narrative:  Per HPI: Mary Dougherty is a 48 y.o. female with medical history significant of anemia with hereditary spherocytosis, diabetes mellitus type 2, breast cancer(s/p lumpectomy, chemotherapy, and radiation), who presents with complaints of epigastric abdominal pain.  Symptoms initially started around 4 PM 2 days ago after she had eaten at 2 PM and then had a milkshake.  Describes the pain as sharp with radiation to her back.  She initially thought symptoms were related to gas or possibly a heart attack as heart disease runs in her family.  She took Tums and a baby aspirin without significant change in symptoms.  She came to the emergency department for further evaluation and had a EKG done, but due to the wait time left before being fully evaluated. She followed up with her primary care provider yesterday where lab work was obtained as she was found to have yellowing of her eyes.  She continued to have epigastric abdominal pain and reported that her stools were very light in color.  Patient denies having any significant fever, shortness of breath, nausea, vomiting, diarrhea, or dysuria.  11/11: Patient admitted for cholelithiasis and pancreatitis with improvement in symptoms this am. MRCP with no definitive obstruction noted and GI has consulted GS for lap chole and IOC. CLD today. Continue IVF and trend labs.  Assessment & Plan:   Principal Problem:   Acute biliary pancreatitis Active Problems:   Anemia   Type 2 diabetes mellitus with complication, without long-term current use of insulin (HCC)   Hyperbilirubinemia   Transaminitis   Leukocytosis   GERD (gastroesophageal reflux disease)   Transaminitis and hyperbilirubinemia secondary to suspect Bilary pancreatitis: Patient presents with complaints of abdominal pain.  Lipase 2605, total bilirubin 13.3, alkaline  phosphatase 134, AST 98, and ALT 226.  Due to imaging studies confirmed signs of pancreatitis with biliary sludge and stones seen. -LFTs downtrending with sludge on CBC noted on MRCP -CLD today per GI -Lactated Ringer's at 150 mL/h -Pain control -Incentive spirometry -Appreciate GI consultative services, we will follow-up for further recommendations -Trend LFTs  Cholelithiasis: Imaging studies revealed sludge and possible small stones within the gallbladder with signs of intraluminal gas. -IV antibiotics of Zosyn  -General Surgery consult per GI with lap chole and IOC soon  Leukocytosis: Acute.  WBC elevated at 11.9.  Suspect secondary to above. -Recheck CBC in a.m.  Anemia: Hemoglobin 11.6 which appears near patient's baseline.  Patient denies any reports of bleeding. -Continue to monitor  Diabetes mellitus type 2: Patient reports history of diabetes and currently on metformin for treatment.  Last hemoglobin A1c noted to be 7.8 on 03/26/2018   -A1c 5.1% -Hypoglycemic protocol -Hold Metformin  -Sensitive SSI  History of breast cancer: Patient -Continue Aromasin  GERD -Continue pharmacy substitution for omeprazole   DVT prophylaxis:Lovenox Code Status: Full Family Communication: None at bedside Disposition Plan: Continue management per GI and GS with plans for lap chole and IOC soon.   Consultants:   GI  GS  Procedures:   MRCP 11/10  Antimicrobials:  Anti-infectives (From admission, onward)   Start     Dose/Rate Route Frequency Ordered Stop   12/13/18 1700  piperacillin-tazobactam (ZOSYN) IVPB 3.375 g     3.375 g 12.5 mL/hr over 240 Minutes Intravenous Every 8 hours 12/13/18 0945     12/13/18 1400  piperacillin-tazobactam (ZOSYN) IVPB 3.375 g  Status:  Discontinued     3.375 g 100 mL/hr over 30 Minutes Intravenous Every 8 hours 12/13/18 0940 12/13/18 0945   12/13/18 0800  piperacillin-tazobactam (ZOSYN) IVPB 3.375 g     3.375 g 100 mL/hr over 30 Minutes  Intravenous  Once 12/13/18 0745 12/13/18 0916       Subjective: Patient seen and evaluated today with no new acute complaints or concerns. No acute concerns or events noted overnight. No abdominal pain or N/V noted. LFTs are downtrending.  Objective: Vitals:   12/13/18 1330 12/13/18 1700 12/13/18 2355 12/14/18 0500  BP: (!) 102/54 (!) 108/58 (!) 115/59 129/66  Pulse: 66  70 75  Resp: 16 16 18 18   Temp:  98.6 F (37 C) 98 F (36.7 C) 98 F (36.7 C)  TempSrc:  Oral Oral Oral  SpO2: 97% 97% 97% 98%    Intake/Output Summary (Last 24 hours) at 12/14/2018 1121 Last data filed at 12/14/2018 0600 Gross per 24 hour  Intake 1837.15 ml  Output --  Net 1837.15 ml   There were no vitals filed for this visit.  Examination:  General exam: Appears calm and comfortable  Respiratory system: Clear to auscultation. Respiratory effort normal. Cardiovascular system: S1 & S2 heard, RRR. No JVD, murmurs, rubs, gallops or clicks. No pedal edema. Gastrointestinal system: Abdomen is nondistended, soft and nontender. No organomegaly or masses felt. Normal bowel sounds heard. Central nervous system: Alert and oriented. No focal neurological deficits. Extremities: Symmetric 5 x 5 power. Skin: No rashes, lesions or ulcers Psychiatry: Judgement and insight appear normal. Mood & affect appropriate.     Data Reviewed: I have personally reviewed following labs and imaging studies  CBC: Recent Labs  Lab 12/11/18 1743 12/13/18 0553 12/14/18 0515  WBC 12.9* 11.9* 9.4  HGB 11.1* 11.6* 11.1*  HCT 31.5* 33.2* 31.8*  MCV 92.1 92.0 93.5  PLT 174 164 [id number]*   Basic Metabolic Panel: Recent Labs  Lab 12/11/18 1743 12/12/18 1712 12/13/18 0553 12/13/18 0754 12/14/18 0515  NA 139 139 135  --  139  K 4.0 3.9 3.7  --  3.5  CL 102 102 104  --  105  CO2 24 22 21*  --  22  GLUCOSE 205* 159* 245*  --  89  BUN 12 9 9   --  5*  CREATININE 0.93 0.71 0.65  --  0.64  CALCIUM 9.0 9.2 9.1  --  8.9  MG  --    --   --  1.8  --   PHOS  --   --   --  4.2  --    GFR: Estimated Creatinine Clearance: 82.1 mL/min (by C-G formula based on SCr of 0.64 mg/dL). Liver Function Tests: Recent Labs  Lab 12/12/18 1712 12/13/18 0553 12/14/18 0515  AST 114* 98* 57*  ALT 237* 226* 159*  ALKPHOS 161* 136* 121  BILITOT 9.1* 13.3* 13.1*  PROT 7.3 7.4 6.8  ALBUMIN 4.6 3.9 3.4*   Recent Labs  Lab 12/11/18 1743 12/13/18 0553  LIPASE 28 2,605*   No results for input(s): AMMONIA in the last 168 hours. Coagulation Profile: No results for input(s): INR, PROTIME in the last 168 hours. Cardiac Enzymes: No results for input(s): CKTOTAL, CKMB, CKMBINDEX, TROPONINI in the last 168 hours. BNP (last 3 results) No results for input(s): PROBNP in the last 8760 hours. HbA1C: Recent Labs    12/14/18 0515  HGBA1C 5.1   CBG: Recent Labs  Lab 12/13/18 1212 12/13/18 1752 12/14/18 0000 12/14/18  0527  GLUCAP 120* 94 83 85   Lipid Profile: No results for input(s): CHOL, HDL, LDLCALC, TRIG, CHOLHDL, LDLDIRECT in the last 72 hours. Thyroid Function Tests: No results for input(s): TSH, T4TOTAL, FREET4, T3FREE, THYROIDAB in the last 72 hours. Anemia Panel: No results for input(s): VITAMINB12, FOLATE, FERRITIN, TIBC, IRON, RETICCTPCT in the last 72 hours. Sepsis Labs: Recent Labs  Lab 12/13/18 0753 12/13/18 1750  LATICACIDVEN 1.2 0.8    Recent Results (from the past 240 hour(s))  Blood culture (routine x 2)     Status: None (Preliminary result)   Collection Time: 12/13/18  8:30 AM   Specimen: BLOOD LEFT HAND  Result Value Ref Range Status   Specimen Description BLOOD LEFT HAND  Final   Special Requests   Final    BOTTLES DRAWN AEROBIC AND ANAEROBIC Blood Culture results may not be optimal due to an inadequate volume of blood received in culture bottles   Culture   Final    NO GROWTH < 24 HOURS Performed at [facility], [place] [address], [place], [place] [address]    Report Status PENDING   Incomplete  SARS CORONAVIRUS 2 (TAT 6-24 HRS) Nasopharyngeal Nasopharyngeal Swab     Status: None   Collection Time: 12/13/18  8:30 AM   Specimen: Nasopharyngeal Swab  Result Value Ref Range Status   SARS Coronavirus 2 NEGATIVE NEGATIVE Final    Comment: (NOTE) SARS-CoV-2 target nucleic acids are NOT DETECTED. The SARS-CoV-2 RNA is generally detectable in upper and lower respiratory specimens during the acute phase of infection. Negative results do not preclude SARS-CoV-2 infection, do not rule out co-infections with other pathogens, and should not be used as the sole basis for treatment or other patient management decisions. Negative results must be combined with clinical observations, patient history, and epidemiological information. The expected result is Negative. Fact Sheet for Patients: SugarRoll.be Fact Sheet for Healthcare Providers: https://www.woods-mathews.com/ This test is not yet approved or cleared by the [place] FDA and  has been authorized for detection and/or diagnosis of SARS-CoV-2 by FDA under an Emergency Use Authorization (EUA). This EUA will remain  in effect (meaning this test can be used) for the duration of the COVID-19 declaration under Section 56 4(b)(1) of the Act, 21 U.S.C. section 360bbb-3(b)(1), unless the authorization is terminated or revoked sooner. Performed at [facility], [place] [address], [place], [place] [address]   Blood culture (routine x 2)     Status: None (Preliminary result)   Collection Time: 12/13/18  8:53 AM   Specimen: BLOOD LEFT FOREARM  Result Value Ref Range Status   Specimen Description BLOOD LEFT FOREARM  Final   Special Requests   Final    BOTTLES DRAWN AEROBIC AND ANAEROBIC Blood Culture adequate volume   Culture   Final    NO GROWTH < 24 HOURS Performed at [facility], [place] [address], [place], [place] [address]    Report Status PENDING  Incomplete          Radiology Studies: Ct Abdomen Pelvis W Contrast  Result Date: 12/13/2018 CLINICAL DATA:  Epigastric pain, episode Saturday, resolved, recurrent today at 0300 hours, associated jaundice, lower abdominal pain, leukocytosis, history breast cancer in 2014 with recurrence in 2019 EXAM: CT ABDOMEN AND PELVIS WITH CONTRAST TECHNIQUE: Multidetector CT imaging of the abdomen and pelvis was performed using the standard protocol following bolus administration of intravenous contrast. Sagittal and coronal MPR images reconstructed from axial data set. CONTRAST:  189mL  OMNIPAQUE IOHEXOL 300 MG/ML SOLN IV. No oral contrast. COMPARISON:  10/26/2012 FINDINGS: Lower chest: Lung bases clear Hepatobiliary: Minimal dependent density in gallbladder suspect tiny calculi. No definite gallbladder wall thickening. Liver unremarkable. No biliary dilatation or biliary air. Pancreas: Edema identified adjacent to pancreatic tail most consistent with pancreatitis. Edema is also seen extending into the small bowel mesentery inferior to the stomach and posterior to the transverse colon. No focal fluid collection. No pancreatic hemorrhage or necrosis. Spleen: Enlarged, 13.7 x 15.2 x 5.1 cm (volume = 560 cm^3). No focal lesions. Small splenule. Adrenals/Urinary Tract: Tiny cyst at upper pole LEFT kidney. Adrenal glands, kidneys, ureters, and bladder otherwise normal appearance. Stomach/Bowel: Minimal diverticulosis of descending and sigmoid colon. Normal appendix. Stomach and bowel loops otherwise normal appearance. Vascular/Lymphatic: Minimal atherosclerotic calcification aorta. Aorta normal caliber. No adenopathy. Portal, splenic, and superior mesenteric veins patent. Reproductive: Uterus surgically absent with nonvisualization of ovaries Other: No free air or free fluid. Tiny umbilical hernia containing fat. Musculoskeletal: No acute osseous findings. IMPRESSION: Edema adjacent to the pancreatic tail consistent with pancreatitis;  recommend correlation with serum amylase/lipase. No evidence of pancreatic hemorrhage or necrosis. Mild splenomegaly. Suspectedcholelithiasis. Minimal distal colonic diverticulosis. Aortic Atherosclerosis (ICD10-I70.0). Electronically Signed   By: Lavonia Dana M.D.   On: 12/13/2018 08:29   Mr 3d Recon At Scanner  Result Date: 12/13/2018 CLINICAL DATA:  48 year old female with history of pancreatitis. Epigastric pain. Evaluate for potential choledocholithiasis. EXAM: MRI ABDOMEN WITHOUT AND WITH CONTRAST (INCLUDING MRCP) TECHNIQUE: Multiplanar multisequence MR imaging of the abdomen was performed both before and after the administration of intravenous contrast. Heavily T2-weighted images of the biliary and pancreatic ducts were obtained, and three-dimensional MRCP images were rendered by post processing. CONTRAST:  31mL GADAVIST GADOBUTROL 1 MMOL/ML IV SOLN COMPARISON:  No prior abdominal MRI. CT the abdomen and pelvis 12/05/2018. FINDINGS: Lower chest: Cardiomegaly. Postoperative changes of right-sided modified radical mastectomy. Hepatobiliary: Diffuse loss of signal intensity throughout the hepatic parenchyma on out of phase dual echo images, indicative of hepatic steatosis. No suspicious cystic or solid hepatic lesions. No intra or extrahepatic biliary ductal dilatation noted on MRCP images. Common bile duct measures 6 mm in the porta hepatis. No well-defined filling defects in the common bile duct to suggest choledocholithiasis. However, there is amorphous T2 hypointensity the lying dependently in the common bile duct which corresponds to T1 hyperintensity lying dependently in the common bile duct, compatible with biliary sludge in the duct. There is some ill-defined low T2 signal and high T1 intensity material lying dependently in the gallbladder which may reflect a small amount of biliary sludge and/or small gallstones. Gallbladder does not appear distended. No gallbladder wall thickening or significant  volume of pericholecystic fluid. Pancreas: Extensive inflammatory changes are noted within and around the pancreas, compatible with an acute pancreatitis. No discrete pancreatic mass. Although there is extensive peripancreatic fluid, no well-defined fluid collection is confidently identified to clearly indicate the presence of a pancreatic pseudocyst. The pancreatic parenchyma appears to enhance normally. No pancreatic ductal dilatation noted on MRCP images. Spleen: The spleen is enlarged measuring 15.1 x 6.4 x 15.0 cm (estimated splenic volume of 725 mL). In addition, the spleen demonstrates diffuse low signal intensity on T2 weighted images, and loss of signal intensity on in phase dual echo images, indicative of significant iron deposition. Adrenals/Urinary Tract: In the upper pole of the left kidney there is an exophytic T1 hypointense, T2 hyperintense, nonenhancing lesion, compatible with a simple cyst. Right kidney and bilateral adrenal  glands are normal in appearance. Stomach/Bowel: T2 hyperintense fluid adjacent to portions of the distal stomach and duodenum, presumably related to the adjacent inflammation in the pancreas. Otherwise, visualized portions are unremarkable. Vascular/Lymphatic: No aneurysm identified in the visualized abdominal vasculature. Splenic vein, superior mesenteric vein and portal vein all remain patent at this time. No lymphadenopathy noted in the visualized abdomen. Other: Inflammatory fluid throughout the retroperitoneum most evident adjacent to the pancreas. No significant volume of ascites in the visualized portions of the peritoneal cavity. Musculoskeletal: No aggressive appearing osseous lesions are noted in the visualized portions of the skeleton. IMPRESSION: 1. Acute pancreatitis, without evidence of pancreatic necrosis, pancreatic pseudocyst or other acute complicating features. 2. Biliary sludge and/or small gallstones lying dependently in the gallbladder. No findings to  suggest an acute cholecystitis at this time. 3. Although there is no choledocholithiasis, there is clearly biliary sludge in the common bile duct lying dependently. Despite this, there is no evidence of biliary tract obstruction at this time. Common bile duct measures 6 mm in the porta hepatis. 4. Hepatic steatosis. 5. Iron deposition in the spleen. 6. Cardiomegaly. Electronically Signed   By: Vinnie Langton M.D.   On: 12/13/2018 17:16   Mr Abdomen Mrcp Moise Boring Contast  Result Date: 12/13/2018 CLINICAL DATA:  48 year old female with history of pancreatitis. Epigastric pain. Evaluate for potential choledocholithiasis. EXAM: MRI ABDOMEN WITHOUT AND WITH CONTRAST (INCLUDING MRCP) TECHNIQUE: Multiplanar multisequence MR imaging of the abdomen was performed both before and after the administration of intravenous contrast. Heavily T2-weighted images of the biliary and pancreatic ducts were obtained, and three-dimensional MRCP images were rendered by post processing. CONTRAST:  39mL GADAVIST GADOBUTROL 1 MMOL/ML IV SOLN COMPARISON:  No prior abdominal MRI. CT the abdomen and pelvis 12/05/2018. FINDINGS: Lower chest: Cardiomegaly. Postoperative changes of right-sided modified radical mastectomy. Hepatobiliary: Diffuse loss of signal intensity throughout the hepatic parenchyma on out of phase dual echo images, indicative of hepatic steatosis. No suspicious cystic or solid hepatic lesions. No intra or extrahepatic biliary ductal dilatation noted on MRCP images. Common bile duct measures 6 mm in the porta hepatis. No well-defined filling defects in the common bile duct to suggest choledocholithiasis. However, there is amorphous T2 hypointensity the lying dependently in the common bile duct which corresponds to T1 hyperintensity lying dependently in the common bile duct, compatible with biliary sludge in the duct. There is some ill-defined low T2 signal and high T1 intensity material lying dependently in the gallbladder  which may reflect a small amount of biliary sludge and/or small gallstones. Gallbladder does not appear distended. No gallbladder wall thickening or significant volume of pericholecystic fluid. Pancreas: Extensive inflammatory changes are noted within and around the pancreas, compatible with an acute pancreatitis. No discrete pancreatic mass. Although there is extensive peripancreatic fluid, no well-defined fluid collection is confidently identified to clearly indicate the presence of a pancreatic pseudocyst. The pancreatic parenchyma appears to enhance normally. No pancreatic ductal dilatation noted on MRCP images. Spleen: The spleen is enlarged measuring 15.1 x 6.4 x 15.0 cm (estimated splenic volume of 725 mL). In addition, the spleen demonstrates diffuse low signal intensity on T2 weighted images, and loss of signal intensity on in phase dual echo images, indicative of significant iron deposition. Adrenals/Urinary Tract: In the upper pole of the left kidney there is an exophytic T1 hypointense, T2 hyperintense, nonenhancing lesion, compatible with a simple cyst. Right kidney and bilateral adrenal glands are normal in appearance. Stomach/Bowel: T2 hyperintense fluid adjacent to portions of the  distal stomach and duodenum, presumably related to the adjacent inflammation in the pancreas. Otherwise, visualized portions are unremarkable. Vascular/Lymphatic: No aneurysm identified in the visualized abdominal vasculature. Splenic vein, superior mesenteric vein and portal vein all remain patent at this time. No lymphadenopathy noted in the visualized abdomen. Other: Inflammatory fluid throughout the retroperitoneum most evident adjacent to the pancreas. No significant volume of ascites in the visualized portions of the peritoneal cavity. Musculoskeletal: No aggressive appearing osseous lesions are noted in the visualized portions of the skeleton. IMPRESSION: 1. Acute pancreatitis, without evidence of pancreatic  necrosis, pancreatic pseudocyst or other acute complicating features. 2. Biliary sludge and/or small gallstones lying dependently in the gallbladder. No findings to suggest an acute cholecystitis at this time. 3. Although there is no choledocholithiasis, there is clearly biliary sludge in the common bile duct lying dependently. Despite this, there is no evidence of biliary tract obstruction at this time. Common bile duct measures 6 mm in the porta hepatis. 4. Hepatic steatosis. 5. Iron deposition in the spleen. 6. Cardiomegaly. Electronically Signed   By: Vinnie Langton M.D.   On: 12/13/2018 17:16   US Abdomen Limited Ruq  Result Date: 12/13/2018 CLINICAL DATA:  Onset right upper quadrant pain at 3 a.m. last night. EXAM: ULTRASOUND ABDOMEN LIMITED RIGHT UPPER QUADRANT COMPARISON:  None. FINDINGS: Gallbladder: There is sludge within the gallbladder. There may be a few small stones present. Multiple punctate echogenic foci in the non dependent gallbladder are also seen. No wall thickening or pericholecystic fluid. Common bile duct: Diameter: 0.6 cm Liver: Echogenicity is increased in echotexture is heterogeneous. No focal lesion. Portal vein is patent on color Doppler imaging with normal direction of blood flow towards the liver. Other: None. IMPRESSION: Sludge and possibly small stones within the gallbladder. Nondependent echogenic foci in the gallbladder worrisome for the presence of intraluminal gas. CT scan with contrast is recommended for further evaluation. Electronically Signed   By: Inge Rise M.D.   On: 12/13/2018 06:57        Scheduled Meds:  enoxaparin (LOVENOX) injection  40 mg Subcutaneous Q24H   exemestane  25 mg Oral Q lunch   insulin aspart  0-9 Units Subcutaneous Q6H   pantoprazole  40 mg Oral Daily   Continuous Infusions:  lactated ringers 150 mL/hr at 12/14/18 0059   piperacillin-tazobactam (ZOSYN)  IV 3.375 g (12/14/18 0915)     LOS: 1 day    Time spent: 30  minutes    Ceara Wrightson Darleen Crocker, DO [facility] Pager [phone]  If 7PM-7AM, please contact night-coverage [email or web address] Password [email or web address] 12/14/2018, 11:21 AM

## 2018-12-15 DIAGNOSIS — R935 Abnormal findings on diagnostic imaging of other abdominal regions, including retroperitoneum: Secondary | ICD-10-CM

## 2018-12-15 LAB — CBC
HCT: 29.2 % — ABNORMAL LOW (ref 36.0–46.0)
Hemoglobin: 10.3 g/dL — ABNORMAL LOW (ref 12.0–15.0)
MCH: 32.7 pg (ref 26.0–34.0)
MCHC: 35.3 g/dL (ref 30.0–36.0)
MCV: 92.7 fL (ref 80.0–100.0)
Platelets: 124 10*3/uL — ABNORMAL LOW (ref 150–400)
RBC: 3.15 MIL/uL — ABNORMAL LOW (ref 3.87–5.11)
RDW: 17.2 % — ABNORMAL HIGH (ref 11.5–15.5)
WBC: 7.9 10*3/uL (ref 4.0–10.5)
nRBC: 0 % (ref 0.0–0.2)

## 2018-12-15 LAB — COMPREHENSIVE METABOLIC PANEL
ALT: 120 U/L — ABNORMAL HIGH (ref 0–44)
AST: 45 U/L — ABNORMAL HIGH (ref 15–41)
Albumin: 3.1 g/dL — ABNORMAL LOW (ref 3.5–5.0)
Alkaline Phosphatase: 115 U/L (ref 38–126)
Anion gap: 8 (ref 5–15)
BUN: <5 mg/dL — ABNORMAL LOW (ref 6–20)
CO2: 25 mmol/L (ref 22–32)
Calcium: 8.9 mg/dL (ref 8.9–10.3)
Chloride: 106 mmol/L (ref 98–111)
Creatinine, Ser: 0.64 mg/dL (ref 0.44–1.00)
GFR calc Af Amer: >60 mL/min (ref >60)
GFR calc non Af Amer: >60 mL/min (ref >60)
Glucose, Bld: 128 mg/dL — ABNORMAL HIGH (ref 70–99)
Potassium: 3.4 mmol/L — ABNORMAL LOW (ref 3.5–5.1)
Sodium: 139 mmol/L (ref 135–145)
Total Bilirubin: 9.6 mg/dL — ABNORMAL HIGH (ref 0.3–1.2)
Total Protein: 6.4 g/dL — ABNORMAL LOW (ref 6.5–8.1)

## 2018-12-15 LAB — SURGICAL PCR SCREEN
MRSA, PCR: NEGATIVE
Staphylococcus aureus: NEGATIVE

## 2018-12-15 LAB — GLUCOSE, CAPILLARY
Glucose-Capillary: 106 mg/dL — ABNORMAL HIGH (ref 70–99)
Glucose-Capillary: 116 mg/dL — ABNORMAL HIGH (ref 70–99)
Glucose-Capillary: 149 mg/dL — ABNORMAL HIGH (ref 70–99)
Glucose-Capillary: 202 mg/dL — ABNORMAL HIGH (ref 70–99)

## 2018-12-15 LAB — LIPASE, BLOOD: Lipase: 205 U/L — ABNORMAL HIGH (ref 11–51)

## 2018-12-15 MED ORDER — POTASSIUM CHLORIDE CRYS ER 20 MEQ PO TBCR
40.0000 meq | EXTENDED_RELEASE_TABLET | Freq: Once | ORAL | Status: AC
  Administered 2018-12-15: 40 meq via ORAL
  Filled 2018-12-15: qty 2

## 2018-12-15 NOTE — Progress Notes (Signed)
[facility] Progress Note     Subjective: CC-  Feeling much better. Abdomen a little sore but pain is less. Tolerating clear liquids. BM yesterday. LFTs and lipase trending down.  Objective: Vital signs in last 24 hours: Temp:  [98.1 F (36.7 C)-98.6 F (37 C)] 98.1 F (36.7 C) (11/12 0613) Pulse Rate:  [75-94] 75 (11/12 0613) Resp:  [16-18] 18 (11/12 0613) BP: (107-131)/(57-62) 107/57 (11/12 0613) SpO2:  [96 %-97 %] 97 % (11/12 0613) Last BM Date: 12/13/18  Intake/Output from previous day: No intake/output data recorded. Intake/Output this shift: No intake/output data recorded.  PE: Gen:  Alert, NAD, pleasant HEENT: EOM's intact, pupils equal and round Card:  RRR Pulm:  CTAB, no W/R/R, effort normal Abd: Soft, ND, mild fullness and TTP in the upper abdomen, no peritonitis, +BS Psych: A&Ox3  Skin: no rashes noted, warm and dry  Lab Results:  Recent Labs    12/14/18 0515 12/15/18 0651  WBC 9.4 7.9  HGB 11.1* 10.3*  HCT 31.8* 29.2*  PLT 146* 124*   BMET Recent Labs    12/14/18 0515 12/15/18 0651  NA 139 139  K 3.5 3.4*  CL 105 106  CO2 22 25  GLUCOSE 89 128*  BUN 5* <5*  CREATININE 0.64 0.64  CALCIUM 8.9 8.9   PT/INR No results for input(s): LABPROT, INR in the last 72 hours. CMP     Component Value Date/Time   NA 139 12/15/2018 0651   NA 139 12/12/2018 1712   NA 141 03/08/2015 1544   K 3.4 (L) 12/15/2018 0651   K 4.2 03/08/2015 1544   CL 106 12/15/2018 0651   CO2 25 12/15/2018 0651   CO2 26 03/08/2015 1544   GLUCOSE 128 (H) 12/15/2018 0651   GLUCOSE 189 (H) 03/08/2015 1544   BUN <5 (L) 12/15/2018 0651   BUN 9 12/12/2018 1712   BUN 12.6 03/08/2015 1544   CREATININE 0.64 12/15/2018 0651   CREATININE 0.76 04/25/2018 0803   CREATININE 0.8 03/08/2015 1544   CALCIUM 8.9 12/15/2018 0651   CALCIUM 9.3 03/08/2015 1544   PROT 6.4 (L) 12/15/2018 0651   PROT 7.3 12/12/2018 1712   PROT 7.8 03/08/2015 1544   ALBUMIN 3.1 (L) 12/15/2018  0651   ALBUMIN 4.6 12/12/2018 1712   ALBUMIN 4.0 03/08/2015 1544   AST 45 (H) 12/15/2018 0651   AST 29 04/25/2018 0803   AST 14 03/08/2015 1544   ALT 120 (H) 12/15/2018 0651   ALT 70 (H) 04/25/2018 0803   ALT 19 03/08/2015 1544   ALKPHOS 115 12/15/2018 0651   ALKPHOS 112 03/08/2015 1544   BILITOT 9.6 (H) 12/15/2018 0651   BILITOT 9.1 (H) 12/12/2018 1712   BILITOT 2.2 (H) 04/25/2018 0803   BILITOT 1.45 (H) 03/08/2015 1544   GFRNONAA >60 12/15/2018 0651   GFRNONAA >60 04/25/2018 0803   GFRNONAA >89 01/25/2015 0859   GFRAA >60 12/15/2018 0651   GFRAA >60 04/25/2018 0803   GFRAA >89 01/25/2015 0859   Lipase     Component Value Date/Time   LIPASE 205 (H) 12/15/2018 0651       Studies/Results: Mr 3d Recon At Scanner  Result Date: 12/13/2018 CLINICAL DATA:  48 year old female with history of pancreatitis. Epigastric pain. Evaluate for potential choledocholithiasis. EXAM: MRI ABDOMEN WITHOUT AND WITH CONTRAST (INCLUDING MRCP) TECHNIQUE: Multiplanar multisequence MR imaging of the abdomen was performed both before and after the administration of intravenous contrast. Heavily T2-weighted images of the biliary and pancreatic ducts were obtained, and  three-dimensional MRCP images were rendered by post processing. CONTRAST:  37mL GADAVIST GADOBUTROL 1 MMOL/ML IV SOLN COMPARISON:  No prior abdominal MRI. CT the abdomen and pelvis 12/05/2018. FINDINGS: Lower chest: Cardiomegaly. Postoperative changes of right-sided modified radical mastectomy. Hepatobiliary: Diffuse loss of signal intensity throughout the hepatic parenchyma on out of phase dual echo images, indicative of hepatic steatosis. No suspicious cystic or solid hepatic lesions. No intra or extrahepatic biliary ductal dilatation noted on MRCP images. Common bile duct measures 6 mm in the porta hepatis. No well-defined filling defects in the common bile duct to suggest choledocholithiasis. However, there is amorphous T2 hypointensity the  lying dependently in the common bile duct which corresponds to T1 hyperintensity lying dependently in the common bile duct, compatible with biliary sludge in the duct. There is some ill-defined low T2 signal and high T1 intensity material lying dependently in the gallbladder which may reflect a small amount of biliary sludge and/or small gallstones. Gallbladder does not appear distended. No gallbladder wall thickening or significant volume of pericholecystic fluid. Pancreas: Extensive inflammatory changes are noted within and around the pancreas, compatible with an acute pancreatitis. No discrete pancreatic mass. Although there is extensive peripancreatic fluid, no well-defined fluid collection is confidently identified to clearly indicate the presence of a pancreatic pseudocyst. The pancreatic parenchyma appears to enhance normally. No pancreatic ductal dilatation noted on MRCP images. Spleen: The spleen is enlarged measuring 15.1 x 6.4 x 15.0 cm (estimated splenic volume of 725 mL). In addition, the spleen demonstrates diffuse low signal intensity on T2 weighted images, and loss of signal intensity on in phase dual echo images, indicative of significant iron deposition. Adrenals/Urinary Tract: In the upper pole of the left kidney there is an exophytic T1 hypointense, T2 hyperintense, nonenhancing lesion, compatible with a simple cyst. Right kidney and bilateral adrenal glands are normal in appearance. Stomach/Bowel: T2 hyperintense fluid adjacent to portions of the distal stomach and duodenum, presumably related to the adjacent inflammation in the pancreas. Otherwise, visualized portions are unremarkable. Vascular/Lymphatic: No aneurysm identified in the visualized abdominal vasculature. Splenic vein, superior mesenteric vein and portal vein all remain patent at this time. No lymphadenopathy noted in the visualized abdomen. Other: Inflammatory fluid throughout the retroperitoneum most evident adjacent to the  pancreas. No significant volume of ascites in the visualized portions of the peritoneal cavity. Musculoskeletal: No aggressive appearing osseous lesions are noted in the visualized portions of the skeleton. IMPRESSION: 1. Acute pancreatitis, without evidence of pancreatic necrosis, pancreatic pseudocyst or other acute complicating features. 2. Biliary sludge and/or small gallstones lying dependently in the gallbladder. No findings to suggest an acute cholecystitis at this time. 3. Although there is no choledocholithiasis, there is clearly biliary sludge in the common bile duct lying dependently. Despite this, there is no evidence of biliary tract obstruction at this time. Common bile duct measures 6 mm in the porta hepatis. 4. Hepatic steatosis. 5. Iron deposition in the spleen. 6. Cardiomegaly. Electronically Signed   By: Vinnie Langton M.D.   On: 12/13/2018 17:16   Mr Abdomen Mrcp Moise Boring Contast  Result Date: 12/13/2018 CLINICAL DATA:  48 year old female with history of pancreatitis. Epigastric pain. Evaluate for potential choledocholithiasis. EXAM: MRI ABDOMEN WITHOUT AND WITH CONTRAST (INCLUDING MRCP) TECHNIQUE: Multiplanar multisequence MR imaging of the abdomen was performed both before and after the administration of intravenous contrast. Heavily T2-weighted images of the biliary and pancreatic ducts were obtained, and three-dimensional MRCP images were rendered by post processing. CONTRAST:  16mL GADAVIST GADOBUTROL 1  MMOL/ML IV SOLN COMPARISON:  No prior abdominal MRI. CT the abdomen and pelvis 12/05/2018. FINDINGS: Lower chest: Cardiomegaly. Postoperative changes of right-sided modified radical mastectomy. Hepatobiliary: Diffuse loss of signal intensity throughout the hepatic parenchyma on out of phase dual echo images, indicative of hepatic steatosis. No suspicious cystic or solid hepatic lesions. No intra or extrahepatic biliary ductal dilatation noted on MRCP images. Common bile duct measures 6 mm  in the porta hepatis. No well-defined filling defects in the common bile duct to suggest choledocholithiasis. However, there is amorphous T2 hypointensity the lying dependently in the common bile duct which corresponds to T1 hyperintensity lying dependently in the common bile duct, compatible with biliary sludge in the duct. There is some ill-defined low T2 signal and high T1 intensity material lying dependently in the gallbladder which may reflect a small amount of biliary sludge and/or small gallstones. Gallbladder does not appear distended. No gallbladder wall thickening or significant volume of pericholecystic fluid. Pancreas: Extensive inflammatory changes are noted within and around the pancreas, compatible with an acute pancreatitis. No discrete pancreatic mass. Although there is extensive peripancreatic fluid, no well-defined fluid collection is confidently identified to clearly indicate the presence of a pancreatic pseudocyst. The pancreatic parenchyma appears to enhance normally. No pancreatic ductal dilatation noted on MRCP images. Spleen: The spleen is enlarged measuring 15.1 x 6.4 x 15.0 cm (estimated splenic volume of 725 mL). In addition, the spleen demonstrates diffuse low signal intensity on T2 weighted images, and loss of signal intensity on in phase dual echo images, indicative of significant iron deposition. Adrenals/Urinary Tract: In the upper pole of the left kidney there is an exophytic T1 hypointense, T2 hyperintense, nonenhancing lesion, compatible with a simple cyst. Right kidney and bilateral adrenal glands are normal in appearance. Stomach/Bowel: T2 hyperintense fluid adjacent to portions of the distal stomach and duodenum, presumably related to the adjacent inflammation in the pancreas. Otherwise, visualized portions are unremarkable. Vascular/Lymphatic: No aneurysm identified in the visualized abdominal vasculature. Splenic vein, superior mesenteric vein and portal vein all remain  patent at this time. No lymphadenopathy noted in the visualized abdomen. Other: Inflammatory fluid throughout the retroperitoneum most evident adjacent to the pancreas. No significant volume of ascites in the visualized portions of the peritoneal cavity. Musculoskeletal: No aggressive appearing osseous lesions are noted in the visualized portions of the skeleton. IMPRESSION: 1. Acute pancreatitis, without evidence of pancreatic necrosis, pancreatic pseudocyst or other acute complicating features. 2. Biliary sludge and/or small gallstones lying dependently in the gallbladder. No findings to suggest an acute cholecystitis at this time. 3. Although there is no choledocholithiasis, there is clearly biliary sludge in the common bile duct lying dependently. Despite this, there is no evidence of biliary tract obstruction at this time. Common bile duct measures 6 mm in the porta hepatis. 4. Hepatic steatosis. 5. Iron deposition in the spleen. 6. Cardiomegaly. Electronically Signed   By: Vinnie Langton M.D.   On: 12/13/2018 17:16    Anti-infectives: Anti-infectives (From admission, onward)   Start     Dose/Rate Route Frequency Ordered Stop   12/13/18 1700  piperacillin-tazobactam (ZOSYN) IVPB 3.375 g     3.375 g 12.5 mL/hr over 240 Minutes Intravenous Every 8 hours 12/13/18 0945     12/13/18 1400  piperacillin-tazobactam (ZOSYN) IVPB 3.375 g  Status:  Discontinued     3.375 g 100 mL/hr over 30 Minutes Intravenous Every 8 hours 12/13/18 0940 12/13/18 0945   12/13/18 0800  piperacillin-tazobactam (ZOSYN) IVPB 3.375 g  3.375 g 100 mL/hr over 30 Minutes Intravenous  Once 12/13/18 0745 12/13/18 [id number]       Assessment/Plan Hereditary spherocytosis Well controlled type 2 diabetes mellitus Breast cancer with recurrence s/pright total mastectomy 10/2017, chemotherapy, and radiation currently on Aromasin and followed by Dr. Lindi Adie.  Elevated LFTs Gallstone pancreatitis - MRCP did not show  choledocholithiasis but there is biliary sludge in the common bile duct lying dependently, and common bile duct measures 4mm. GI recommending against ERCP at this time - LFTs and lipase trending down, less tender on exam - Will likely be able to proceed with lap chole IOC tomorrow. Ok for clear liquids, NPO after midnight. Recheck labs in AM.  ID - zosyn 11/10>> VTE - SCDs, lovenox FEN - IVF, CLD, NPO after MN Foley - none Follow up - TBD   LOS: 2 days    Wellington Hampshire, [facility] 12/15/2018, 9:08 AM Please see Amion for pager number during day hours 7:00am-4:30pm

## 2018-12-15 NOTE — Anesthesia Preprocedure Evaluation (Addendum)
Anesthesia Evaluation  Patient identified by MRN, date of birth, ID band Patient awake    Reviewed: Allergy & Precautions, H&P , NPO status , Patient's Chart, lab work & pertinent test results  History of Anesthesia Complications Negative for: history of anesthetic complications  Airway Mallampati: II  TM Distance: >3 FB Neck ROM: Full    Dental no notable dental hx. (+) Dental Advisory Given   Pulmonary shortness of breath,    Pulmonary exam normal        Cardiovascular negative cardio ROS Normal cardiovascular exam     Neuro/Psych negative neurological ROS  negative psych ROS   GI/Hepatic Neg liver ROS, GERD  ,  Endo/Other  diabetesobese  Renal/GU negative Renal ROS     Musculoskeletal Recurrent breast cancer s/p rad/chemo   Abdominal   Peds  Hematology  (+) anemia , Hereditary spherocytosis   Anesthesia Other Findings   Reproductive/Obstetrics                            Anesthesia Physical  Anesthesia Plan  ASA: II  Anesthesia Plan: General   Post-op Pain Management:    Induction: Intravenous  PONV Risk Score and Plan: 4 or greater and Ondansetron, Dexamethasone, Midazolam, Treatment may vary due to age or medical condition and Scopolamine patch - Pre-op  Airway Management Planned: Oral ETT  Additional Equipment:   Intra-op Plan:   Post-operative Plan: Extubation in OR  Informed Consent: I have reviewed the patients History and Physical, chart, labs and discussed the procedure including the risks, benefits and alternatives for the proposed anesthesia with the patient or authorized representative who has indicated his/her understanding and acceptance.     Dental advisory given  Plan Discussed with: Anesthesiologist and CRNA  Anesthesia Plan Comments:        Anesthesia Quick Evaluation

## 2018-12-15 NOTE — Discharge Instructions (Signed)
[facility] ° °Please arrive at least 30 min before your appointment to complete your check in paperwork.  If you are unable to arrive 30 min prior to your appointment time we may have to cancel or reschedule you. °LAPAROSCOPIC SURGERY: POST OP INSTRUCTIONS °Always review your discharge instruction sheet given to you by the facility where your surgery was performed. °IF YOU HAVE DISABILITY OR FAMILY LEAVE FORMS, YOU MUST BRING THEM TO THE OFFICE FOR PROCESSING.   °DO NOT GIVE THEM TO YOUR DOCTOR. ° °PAIN CONTROL ° °1. First take acetaminophen (Tylenol) AND/or ibuprofen (Advil) to control your pain after surgery.  Follow directions on package.  Taking acetaminophen (Tylenol) and/or ibuprofen (Advil) regularly after surgery will help to control your pain and lower the amount of prescription pain medication you may need.  You should not take more than 4,000 mg (4 grams) of acetaminophen (Tylenol) in 24 hours.  You should not take ibuprofen (Advil), aleve, motrin, naprosyn or other NSAIDS if you have a history of stomach ulcers or chronic kidney disease.  °2. A prescription for pain medication may be given to you upon discharge.  Take your pain medication as prescribed, if you still have uncontrolled pain after taking acetaminophen (Tylenol) or ibuprofen (Advil). °3. Use ice packs to help control pain. °4. If you need a refill on your pain medication, please contact your pharmacy.  They will contact our office to request authorization. Prescriptions will not be filled after 5pm or on week-ends. ° °HOME MEDICATIONS °5. Take your usually prescribed medications unless otherwise directed. ° °DIET °6. You should follow a light diet the first few days after arrival home.  Be sure to include lots of fluids daily. Avoid fatty, fried foods.  ° °CONSTIPATION °7. It is common to experience some constipation after surgery and if you are taking pain medication.  Increasing fluid intake and taking a stool  softener (such as Colace) will usually help or prevent this problem from occurring.  A mild laxative (Milk of Magnesia or Miralax) should be taken according to package instructions if there are no bowel movements after 48 hours. ° °WOUND/INCISION CARE °8. Most patients will experience some swelling and bruising in the area of the incisions.  Ice packs will help.  Swelling and bruising can take several days to resolve.  °9. Unless discharge instructions indicate otherwise, follow guidelines below  °a. STERI-STRIPS - you may remove your outer bandages 48 hours after surgery, and you may shower at that time.  You have steri-strips (small skin tapes) in place directly over the incision.  These strips should be left on the skin for 7-10 days.   °b. DERMABOND/SKIN GLUE - you may shower in 24 hours.  The glue will flake off over the next 2-3 weeks. °10. Any sutures or staples will be removed at the office during your follow-up visit. ° °ACTIVITIES °11. You may resume regular (light) daily activities beginning the next day--such as daily self-care, walking, climbing stairs--gradually increasing activities as tolerated.  You may have sexual intercourse when it is comfortable.  Refrain from any heavy lifting or straining until approved by your doctor. °a. You may drive when you are no longer taking prescription pain medication, you can comfortably wear a seatbelt, and you can safely maneuver your car and apply brakes. ° °FOLLOW-UP °12. You should see your doctor in the office for a follow-up appointment approximately 2-3 weeks after your surgery.  You should have been given your post-op/follow-up appointment when   your surgery was scheduled.  If you did not receive a post-op/follow-up appointment, make sure that you call for this appointment within a day or two after you arrive home to insure a convenient appointment time. ° °OTHER INSTRUCTIONS ° °WHEN TO CALL YOUR DOCTOR: °1. Fever over 101.0 °2. Inability to  urinate °3. Continued bleeding from incision. °4. Increased pain, redness, or drainage from the incision. °5. Increasing abdominal pain ° °The clinic staff is available to answer your questions during regular business hours.  Please don’t hesitate to call and ask to speak to one of the nurses for clinical concerns.  If you have a medical emergency, go to the nearest emergency room or call 911.  A surgeon from [facility] is always on call at the hospital. °[address], [address], [place], [place]  [address] ? [address] [address], [place],    [address] °[phone] ? [phone] ? FAX [phone] ° ° ° °

## 2018-12-15 NOTE — Progress Notes (Addendum)
[facility] Progress Note  CC:  Acute pancreatitis  Subjective:  Lipase 205 today, much improved from 2K.  LFT's trending down including bili.  Feeling much better as well.  Tolerating clear liquids.  Objective:  Vital signs in last 24 hours: Temp:  [98.1 F (36.7 C)-98.6 F (37 C)] 98.1 F (36.7 C) (11/12 0613) Pulse Rate:  [75-94] 75 (11/12 0613) Resp:  [16-18] 18 (11/12 0613) BP: (107-131)/(57-62) 107/57 (11/12 0613) SpO2:  [96 %-97 %] 97 % (11/12 0613) Last BM Date: 12/13/18 General:  Alert, Well-developed, in NAD; jaundice/scleral icterus noted Heart:  Regular rate and rhythm; no murmurs Pulm:  No increased WOB.  No W/R/R. Abdomen:  Soft, non-distended.  BS present.  Mild epigastric TTP. Extremities:  Without edema. Neurologic:  Alert and oriented x 4;  grossly normal neurologically. Psych:  Alert and cooperative. Normal mood and affect.  Lab Results: Recent Labs    12/13/18 0553 12/14/18 0515 12/15/18 0651  WBC 11.9* 9.4 7.9  HGB 11.6* 11.1* 10.3*  HCT 33.2* 31.8* 29.2*  PLT 164 146* 124*   BMET Recent Labs    12/13/18 0553 12/14/18 0515 12/15/18 0651  NA 135 139 139  K 3.7 3.5 3.4*  CL 104 105 106  CO2 21* 22 25  GLUCOSE 245* 89 128*  BUN 9 5* <5*  CREATININE 0.65 0.64 0.64  CALCIUM 9.1 8.9 8.9   LFT Recent Labs    12/15/18 0651  PROT 6.4*  ALBUMIN 3.1*  AST 45*  ALT 120*  ALKPHOS 115  BILITOT 9.6*   Mr 3d Recon At Scanner  Result Date: 12/13/2018 CLINICAL DATA:  48 year old female with history of pancreatitis. Epigastric pain. Evaluate for potential choledocholithiasis. EXAM: MRI ABDOMEN WITHOUT AND WITH CONTRAST (INCLUDING MRCP) TECHNIQUE: Multiplanar multisequence MR imaging of the abdomen was performed both before and after the administration of intravenous contrast. Heavily T2-weighted images of the biliary and pancreatic ducts were obtained, and three-dimensional MRCP images were rendered by post processing. CONTRAST:   24mL GADAVIST GADOBUTROL 1 MMOL/ML IV SOLN COMPARISON:  No prior abdominal MRI. CT the abdomen and pelvis 12/05/2018. FINDINGS: Lower chest: Cardiomegaly. Postoperative changes of right-sided modified radical mastectomy. Hepatobiliary: Diffuse loss of signal intensity throughout the hepatic parenchyma on out of phase dual echo images, indicative of hepatic steatosis. No suspicious cystic or solid hepatic lesions. No intra or extrahepatic biliary ductal dilatation noted on MRCP images. Common bile duct measures 6 mm in the porta hepatis. No well-defined filling defects in the common bile duct to suggest choledocholithiasis. However, there is amorphous T2 hypointensity the lying dependently in the common bile duct which corresponds to T1 hyperintensity lying dependently in the common bile duct, compatible with biliary sludge in the duct. There is some ill-defined low T2 signal and high T1 intensity material lying dependently in the gallbladder which may reflect a small amount of biliary sludge and/or small gallstones. Gallbladder does not appear distended. No gallbladder wall thickening or significant volume of pericholecystic fluid. Pancreas: Extensive inflammatory changes are noted within and around the pancreas, compatible with an acute pancreatitis. No discrete pancreatic mass. Although there is extensive peripancreatic fluid, no well-defined fluid collection is confidently identified to clearly indicate the presence of a pancreatic pseudocyst. The pancreatic parenchyma appears to enhance normally. No pancreatic ductal dilatation noted on MRCP images. Spleen: The spleen is enlarged measuring 15.1 x 6.4 x 15.0 cm (estimated splenic volume of 725 mL). In addition, the spleen demonstrates diffuse low signal intensity on T2  weighted images, and loss of signal intensity on in phase dual echo images, indicative of significant iron deposition. Adrenals/Urinary Tract: In the upper pole of the left kidney there is an  exophytic T1 hypointense, T2 hyperintense, nonenhancing lesion, compatible with a simple cyst. Right kidney and bilateral adrenal glands are normal in appearance. Stomach/Bowel: T2 hyperintense fluid adjacent to portions of the distal stomach and duodenum, presumably related to the adjacent inflammation in the pancreas. Otherwise, visualized portions are unremarkable. Vascular/Lymphatic: No aneurysm identified in the visualized abdominal vasculature. Splenic vein, superior mesenteric vein and portal vein all remain patent at this time. No lymphadenopathy noted in the visualized abdomen. Other: Inflammatory fluid throughout the retroperitoneum most evident adjacent to the pancreas. No significant volume of ascites in the visualized portions of the peritoneal cavity. Musculoskeletal: No aggressive appearing osseous lesions are noted in the visualized portions of the skeleton. IMPRESSION: 1. Acute pancreatitis, without evidence of pancreatic necrosis, pancreatic pseudocyst or other acute complicating features. 2. Biliary sludge and/or small gallstones lying dependently in the gallbladder. No findings to suggest an acute cholecystitis at this time. 3. Although there is no choledocholithiasis, there is clearly biliary sludge in the common bile duct lying dependently. Despite this, there is no evidence of biliary tract obstruction at this time. Common bile duct measures 6 mm in the porta hepatis. 4. Hepatic steatosis. 5. Iron deposition in the spleen. 6. Cardiomegaly. Electronically Signed   By: Vinnie Langton M.D.   On: 12/13/2018 17:16   Mr Abdomen Mrcp Moise Boring Contast  Result Date: 12/13/2018 CLINICAL DATA:  49 year old female with history of pancreatitis. Epigastric pain. Evaluate for potential choledocholithiasis. EXAM: MRI ABDOMEN WITHOUT AND WITH CONTRAST (INCLUDING MRCP) TECHNIQUE: Multiplanar multisequence MR imaging of the abdomen was performed both before and after the administration of intravenous  contrast. Heavily T2-weighted images of the biliary and pancreatic ducts were obtained, and three-dimensional MRCP images were rendered by post processing. CONTRAST:  23mL GADAVIST GADOBUTROL 1 MMOL/ML IV SOLN COMPARISON:  No prior abdominal MRI. CT the abdomen and pelvis 12/05/2018. FINDINGS: Lower chest: Cardiomegaly. Postoperative changes of right-sided modified radical mastectomy. Hepatobiliary: Diffuse loss of signal intensity throughout the hepatic parenchyma on out of phase dual echo images, indicative of hepatic steatosis. No suspicious cystic or solid hepatic lesions. No intra or extrahepatic biliary ductal dilatation noted on MRCP images. Common bile duct measures 6 mm in the porta hepatis. No well-defined filling defects in the common bile duct to suggest choledocholithiasis. However, there is amorphous T2 hypointensity the lying dependently in the common bile duct which corresponds to T1 hyperintensity lying dependently in the common bile duct, compatible with biliary sludge in the duct. There is some ill-defined low T2 signal and high T1 intensity material lying dependently in the gallbladder which may reflect a small amount of biliary sludge and/or small gallstones. Gallbladder does not appear distended. No gallbladder wall thickening or significant volume of pericholecystic fluid. Pancreas: Extensive inflammatory changes are noted within and around the pancreas, compatible with an acute pancreatitis. No discrete pancreatic mass. Although there is extensive peripancreatic fluid, no well-defined fluid collection is confidently identified to clearly indicate the presence of a pancreatic pseudocyst. The pancreatic parenchyma appears to enhance normally. No pancreatic ductal dilatation noted on MRCP images. Spleen: The spleen is enlarged measuring 15.1 x 6.4 x 15.0 cm (estimated splenic volume of 725 mL). In addition, the spleen demonstrates diffuse low signal intensity on T2 weighted images, and loss of  signal intensity on in phase dual echo images,  indicative of significant iron deposition. Adrenals/Urinary Tract: In the upper pole of the left kidney there is an exophytic T1 hypointense, T2 hyperintense, nonenhancing lesion, compatible with a simple cyst. Right kidney and bilateral adrenal glands are normal in appearance. Stomach/Bowel: T2 hyperintense fluid adjacent to portions of the distal stomach and duodenum, presumably related to the adjacent inflammation in the pancreas. Otherwise, visualized portions are unremarkable. Vascular/Lymphatic: No aneurysm identified in the visualized abdominal vasculature. Splenic vein, superior mesenteric vein and portal vein all remain patent at this time. No lymphadenopathy noted in the visualized abdomen. Other: Inflammatory fluid throughout the retroperitoneum most evident adjacent to the pancreas. No significant volume of ascites in the visualized portions of the peritoneal cavity. Musculoskeletal: No aggressive appearing osseous lesions are noted in the visualized portions of the skeleton. IMPRESSION: 1. Acute pancreatitis, without evidence of pancreatic necrosis, pancreatic pseudocyst or other acute complicating features. 2. Biliary sludge and/or small gallstones lying dependently in the gallbladder. No findings to suggest an acute cholecystitis at this time. 3. Although there is no choledocholithiasis, there is clearly biliary sludge in the common bile duct lying dependently. Despite this, there is no evidence of biliary tract obstruction at this time. Common bile duct measures 6 mm in the porta hepatis. 4. Hepatic steatosis. 5. Iron deposition in the spleen. 6. Cardiomegaly. Electronically Signed   By: Vinnie Langton M.D.   On: 12/13/2018 17:16   Assessment / Plan: 1. Elevated LFTs in the setting of cholelithiasis and pancreatitis:All LFT's downtrending including total bili.  MRCP shows biliary sludge in the CBD but no definite stones or sign of obstruction, CBD  normal at 6 mm. 2. Leukocytosis:  Resolved 3. History of breast cancer  -Dr. Henrene Pastor is recommending lap chole with IOC when pancreatitis improved per surgery.  Only plan for ERCP if IOC is positive.  We appreciate surgical consult.  Continue to trend LFT's.  Signing off from GI standpoint.  We will be available for surgery to call us back if needed.   LOS: 2 days   Laban Emperor. Zehr  12/15/2018, 9:11 AM  GI ATTENDING  Interval history data reviewed.  Agree with interval progress note as outlined above.  Patient with biliary pancreatitis.  Improving clinically and biochemically.  Appreciate surgical help.  Patient will be for laparoscopic cholecystectomy with IOC when deemed ready from a surgical perspective.  We are available if needed should she have a positive IOC.  We will sign off.  Thank you  Docia Chuck. Geri Seminole., M.D. [facility] Division of Gastroenterology

## 2018-12-15 NOTE — Progress Notes (Signed)
PROGRESS NOTE    Mary Dougherty  [id number] DOB: October 09, 1970 DOA: 12/13/2018 PCP: Forrest Moron, MD   Brief Narrative:  Per HPI: Mary Jude Townsendis a 48 y.o.femalewith medical history significant ofanemia with hereditary spherocytosis, diabetes mellitus type 2, breast cancer(s/plumpectomy, chemotherapy, and radiation), who presents with complaints of epigastric abdominal pain. Symptoms initially started around 4 PM 2 days ago after she had eaten at 2 PM and then had a milkshake. Describes the pain as sharp with radiation to her back. She initially thought symptoms were related to gas or possibly a heart attack as heart disease runs in her family. She took Tums and a baby aspirin without significant change in symptoms. She came to the emergency department for further evaluation and had a EKG done, but due to the wait time left before being fully evaluated. She followed up with her primary care provider yesterday where lab work was obtained as she was found to have yellowing of her eyes. She continued to have epigastric abdominal pain and reported that her stools were very light in color. Patient denies having any significant fever, shortness of breath, nausea, vomiting, diarrhea, or dysuria.  11/11: Patient admitted for cholelithiasis and pancreatitis with improvement in symptoms this am. MRCP with no definitive obstruction noted and GI has consulted GS for lap chole and IOC. CLD today. Continue IVF and trend labs.  11/12: Patient eager to have surgery done today.  She is overall feeling much better.  LFTs are downtrending.  General surgery plans for lap chole with IOC in a.m.  Assessment & Plan:   Principal Problem:   Acute biliary pancreatitis Active Problems:   Anemia   Type 2 diabetes mellitus with complication, without long-term current use of insulin (HCC)   Hyperbilirubinemia   Transaminitis   Leukocytosis   GERD (gastroesophageal reflux disease)   Abdominal  pain   Transaminitis and hyperbilirubinemia secondary to suspectBilary pancreatitis:Patient presents with complaints of abdominal pain. Lipase 2605, total bilirubin 13.3, alkaline phosphatase 134, AST 98, and ALT 226. Due to imaging studies confirmed signs of pancreatitis with biliary sludge and stones seen. -LFTs downtrending with sludge on CBC noted on MRCP -CLD today per GI -Lactated Ringer's at 150 mL/h -Pain control -Incentive spirometry -Appreciate GI consultative services, we will follow-up for further recommendations -Trend LFTs, which are trending down  Cholelithiasis: Imaging studies revealed sludge and possible small stones within the gallbladder with signs of intraluminal gas. -IV antibiotics of Zosyn  -General Surgery consult per GI with lap chole and IOC planned for a.m.  Leukocytosis: Acute. -Currently resolved -Recheck in a.m.  Anemia: Hemoglobin 11.6 which appears near patient's baseline. Patient denies any reports of bleeding. -Continue to monitor  Diabetes mellitus type 2: Patient reports history of diabetes and currently on metformin for treatment. Last hemoglobin A1c noted to be 7.8 on 03/26/2018  -A1c 5.1% -Hypoglycemic protocol -Hold Metformin -Sensitive SSI  History of breast cancer: Patient -Continue Aromasin  GERD -Continue pharmacy substitution for omeprazole   DVT prophylaxis:Lovenox Code Status: Full Family Communication: None at bedside Disposition Plan: Continue management per GI and GS with plans for lap chole and IOC soon.   Consultants:   GI  GS  Procedures:   MRCP 11/10  Antimicrobials:  Anti-infectives (From admission, onward)   Start     Dose/Rate Route Frequency Ordered Stop   12/13/18 1700  piperacillin-tazobactam (ZOSYN) IVPB 3.375 g     3.375 g 12.5 mL/hr over 240 Minutes Intravenous Every 8 hours 12/13/18 0945  12/13/18 1400  piperacillin-tazobactam (ZOSYN) IVPB 3.375 g  Status:  Discontinued      3.375 g 100 mL/hr over 30 Minutes Intravenous Every 8 hours 12/13/18 0940 12/13/18 0945   12/13/18 0800  piperacillin-tazobactam (ZOSYN) IVPB 3.375 g     3.375 g 100 mL/hr over 30 Minutes Intravenous  Once 12/13/18 0745 12/13/18 0916       Subjective: Patient seen and evaluated today with no new acute complaints or concerns. No acute concerns or events noted overnight.  She denies any abdominal pain, nausea, or vomiting.  Objective: Vitals:   12/14/18 1204 12/14/18 1759 12/15/18 0038 12/15/18 0613  BP: 131/62 (!) 114/58 (!) 108/59 (!) 107/57  Pulse: 83 94 77 75  Resp: 16 18 18 18   Temp: 98.5 F (36.9 C) 98.5 F (36.9 C) 98.6 F (37 C) 98.1 F (36.7 C)  TempSrc: Oral Oral Oral Oral  SpO2: 97% 96% 96% 97%   No intake or output data in the 24 hours ending 12/15/18 0947 There were no vitals filed for this visit.  Examination:  General exam: Appears calm and comfortable  Respiratory system: Clear to auscultation. Respiratory effort normal. Cardiovascular system: S1 & S2 heard, RRR. No JVD, murmurs, rubs, gallops or clicks. No pedal edema. Gastrointestinal system: Abdomen is nondistended, soft and nontender. No organomegaly or masses felt. Normal bowel sounds heard. Central nervous system: Alert and oriented. No focal neurological deficits. Extremities: Symmetric 5 x 5 power. Skin: No rashes, lesions or ulcers Psychiatry: Judgement and insight appear normal. Mood & affect appropriate.     Data Reviewed: I have personally reviewed following labs and imaging studies  CBC: Recent Labs  Lab 12/11/18 1743 12/13/18 0553 12/14/18 0515 12/15/18 0651  WBC 12.9* 11.9* 9.4 7.9  HGB 11.1* 11.6* 11.1* 10.3*  HCT 31.5* 33.2* 31.8* 29.2*  MCV 92.1 92.0 93.5 92.7  PLT 174 164 146* [id number]*   Basic Metabolic Panel: Recent Labs  Lab 12/11/18 1743 12/12/18 1712 12/13/18 0553 12/13/18 0754 12/14/18 0515 12/15/18 0651  NA 139 139 135  --  139 139  K 4.0 3.9 3.7  --  3.5 3.4*  CL  102 102 104  --  105 106  CO2 24 22 21*  --  22 25  GLUCOSE 205* 159* 245*  --  89 128*  BUN 12 9 9   --  5* <5*  CREATININE 0.93 0.71 0.65  --  0.64 0.64  CALCIUM 9.0 9.2 9.1  --  8.9 8.9  MG  --   --   --  1.8  --   --   PHOS  --   --   --  4.2  --   --    GFR: Estimated Creatinine Clearance: 82.1 mL/min (by C-G formula based on SCr of 0.64 mg/dL). Liver Function Tests: Recent Labs  Lab 12/12/18 1712 12/13/18 0553 12/14/18 0515 12/15/18 0651  AST 114* 98* 57* 45*  ALT 237* 226* 159* 120*  ALKPHOS 161* 136* 121 115  BILITOT 9.1* 13.3* 13.1* 9.6*  PROT 7.3 7.4 6.8 6.4*  ALBUMIN 4.6 3.9 3.4* 3.1*   Recent Labs  Lab 12/11/18 1743 12/13/18 0553 12/15/18 0651  LIPASE 28 2,605* 205*   No results for input(s): AMMONIA in the last 168 hours. Coagulation Profile: No results for input(s): INR, PROTIME in the last 168 hours. Cardiac Enzymes: No results for input(s): CKTOTAL, CKMB, CKMBINDEX, TROPONINI in the last 168 hours. BNP (last 3 results) No results for input(s): PROBNP in the last 8760  hours. HbA1C: Recent Labs    12/14/18 0515  HGBA1C 5.1   CBG: Recent Labs  Lab 12/14/18 0527 12/14/18 1130 12/14/18 1659 12/15/18 0045 12/15/18 0540  GLUCAP 85 72 116* 116* 106*   Lipid Profile: No results for input(s): CHOL, HDL, LDLCALC, TRIG, CHOLHDL, LDLDIRECT in the last 72 hours. Thyroid Function Tests: No results for input(s): TSH, T4TOTAL, FREET4, T3FREE, THYROIDAB in the last 72 hours. Anemia Panel: No results for input(s): VITAMINB12, FOLATE, FERRITIN, TIBC, IRON, RETICCTPCT in the last 72 hours. Sepsis Labs: Recent Labs  Lab 12/13/18 0753 12/13/18 1750  LATICACIDVEN 1.2 0.8    Recent Results (from the past 240 hour(s))  Blood culture (routine x 2)     Status: None (Preliminary result)   Collection Time: 12/13/18  8:30 AM   Specimen: BLOOD LEFT HAND  Result Value Ref Range Status   Specimen Description BLOOD LEFT HAND  Final   Special Requests   Final     BOTTLES DRAWN AEROBIC AND ANAEROBIC Blood Culture results may not be optimal due to an inadequate volume of blood received in culture bottles   Culture   Final    NO GROWTH 2 DAYS Performed at [facility], [place] [address], [place], [place] [address]    Report Status PENDING  Incomplete  SARS CORONAVIRUS 2 (TAT 6-24 HRS) Nasopharyngeal Nasopharyngeal Swab     Status: None   Collection Time: 12/13/18  8:30 AM   Specimen: Nasopharyngeal Swab  Result Value Ref Range Status   SARS Coronavirus 2 NEGATIVE NEGATIVE Final    Comment: (NOTE) SARS-CoV-2 target nucleic acids are NOT DETECTED. The SARS-CoV-2 RNA is generally detectable in upper and lower respiratory specimens during the acute phase of infection. Negative results do not preclude SARS-CoV-2 infection, do not rule out co-infections with other pathogens, and should not be used as the sole basis for treatment or other patient management decisions. Negative results must be combined with clinical observations, patient history, and epidemiological information. The expected result is Negative. Fact Sheet for Patients: SugarRoll.be Fact Sheet for Healthcare Providers: https://www.woods-mathews.com/ This test is not yet approved or cleared by the [place] FDA and  has been authorized for detection and/or diagnosis of SARS-CoV-2 by FDA under an Emergency Use Authorization (EUA). This EUA will remain  in effect (meaning this test can be used) for the duration of the COVID-19 declaration under Section 56 4(b)(1) of the Act, 21 U.S.C. section 360bbb-3(b)(1), unless the authorization is terminated or revoked sooner. Performed at [facility], [place] [address] [place], [place] [address]   Blood culture (routine x 2)     Status: None (Preliminary result)   Collection Time: 12/13/18  8:53 AM   Specimen: BLOOD LEFT FOREARM  Result Value Ref Range Status   Specimen Description BLOOD LEFT  FOREARM  Final   Special Requests   Final    BOTTLES DRAWN AEROBIC AND ANAEROBIC Blood Culture adequate volume   Culture   Final    NO GROWTH 2 DAYS Performed at [facility], [place] [address], [place], [place] [address]    Report Status PENDING  Incomplete         Radiology Studies: Mr 3d Recon At Scanner  Result Date: 12/13/2018 CLINICAL DATA:  48 year old female with history of pancreatitis. Epigastric pain. Evaluate for potential choledocholithiasis. EXAM: MRI ABDOMEN WITHOUT AND WITH CONTRAST (INCLUDING MRCP) TECHNIQUE: Multiplanar multisequence MR imaging of the abdomen was performed both before and after the administration of intravenous  contrast. Heavily T2-weighted images of the biliary and pancreatic ducts were obtained, and three-dimensional MRCP images were rendered by post processing. CONTRAST:  55mL GADAVIST GADOBUTROL 1 MMOL/ML IV SOLN COMPARISON:  No prior abdominal MRI. CT the abdomen and pelvis 12/05/2018. FINDINGS: Lower chest: Cardiomegaly. Postoperative changes of right-sided modified radical mastectomy. Hepatobiliary: Diffuse loss of signal intensity throughout the hepatic parenchyma on out of phase dual echo images, indicative of hepatic steatosis. No suspicious cystic or solid hepatic lesions. No intra or extrahepatic biliary ductal dilatation noted on MRCP images. Common bile duct measures 6 mm in the porta hepatis. No well-defined filling defects in the common bile duct to suggest choledocholithiasis. However, there is amorphous T2 hypointensity the lying dependently in the common bile duct which corresponds to T1 hyperintensity lying dependently in the common bile duct, compatible with biliary sludge in the duct. There is some ill-defined low T2 signal and high T1 intensity material lying dependently in the gallbladder which may reflect a small amount of biliary sludge and/or small gallstones. Gallbladder does not appear distended. No gallbladder wall thickening or  significant volume of pericholecystic fluid. Pancreas: Extensive inflammatory changes are noted within and around the pancreas, compatible with an acute pancreatitis. No discrete pancreatic mass. Although there is extensive peripancreatic fluid, no well-defined fluid collection is confidently identified to clearly indicate the presence of a pancreatic pseudocyst. The pancreatic parenchyma appears to enhance normally. No pancreatic ductal dilatation noted on MRCP images. Spleen: The spleen is enlarged measuring 15.1 x 6.4 x 15.0 cm (estimated splenic volume of 725 mL). In addition, the spleen demonstrates diffuse low signal intensity on T2 weighted images, and loss of signal intensity on in phase dual echo images, indicative of significant iron deposition. Adrenals/Urinary Tract: In the upper pole of the left kidney there is an exophytic T1 hypointense, T2 hyperintense, nonenhancing lesion, compatible with a simple cyst. Right kidney and bilateral adrenal glands are normal in appearance. Stomach/Bowel: T2 hyperintense fluid adjacent to portions of the distal stomach and duodenum, presumably related to the adjacent inflammation in the pancreas. Otherwise, visualized portions are unremarkable. Vascular/Lymphatic: No aneurysm identified in the visualized abdominal vasculature. Splenic vein, superior mesenteric vein and portal vein all remain patent at this time. No lymphadenopathy noted in the visualized abdomen. Other: Inflammatory fluid throughout the retroperitoneum most evident adjacent to the pancreas. No significant volume of ascites in the visualized portions of the peritoneal cavity. Musculoskeletal: No aggressive appearing osseous lesions are noted in the visualized portions of the skeleton. IMPRESSION: 1. Acute pancreatitis, without evidence of pancreatic necrosis, pancreatic pseudocyst or other acute complicating features. 2. Biliary sludge and/or small gallstones lying dependently in the gallbladder. No  findings to suggest an acute cholecystitis at this time. 3. Although there is no choledocholithiasis, there is clearly biliary sludge in the common bile duct lying dependently. Despite this, there is no evidence of biliary tract obstruction at this time. Common bile duct measures 6 mm in the porta hepatis. 4. Hepatic steatosis. 5. Iron deposition in the spleen. 6. Cardiomegaly. Electronically Signed   By: Vinnie Langton M.D.   On: 12/13/2018 17:16   Mr Abdomen Mrcp Moise Boring Contast  Result Date: 12/13/2018 CLINICAL DATA:  48 year old female with history of pancreatitis. Epigastric pain. Evaluate for potential choledocholithiasis. EXAM: MRI ABDOMEN WITHOUT AND WITH CONTRAST (INCLUDING MRCP) TECHNIQUE: Multiplanar multisequence MR imaging of the abdomen was performed both before and after the administration of intravenous contrast. Heavily T2-weighted images of the biliary and pancreatic ducts were obtained, and three-dimensional  MRCP images were rendered by post processing. CONTRAST:  72mL GADAVIST GADOBUTROL 1 MMOL/ML IV SOLN COMPARISON:  No prior abdominal MRI. CT the abdomen and pelvis 12/05/2018. FINDINGS: Lower chest: Cardiomegaly. Postoperative changes of right-sided modified radical mastectomy. Hepatobiliary: Diffuse loss of signal intensity throughout the hepatic parenchyma on out of phase dual echo images, indicative of hepatic steatosis. No suspicious cystic or solid hepatic lesions. No intra or extrahepatic biliary ductal dilatation noted on MRCP images. Common bile duct measures 6 mm in the porta hepatis. No well-defined filling defects in the common bile duct to suggest choledocholithiasis. However, there is amorphous T2 hypointensity the lying dependently in the common bile duct which corresponds to T1 hyperintensity lying dependently in the common bile duct, compatible with biliary sludge in the duct. There is some ill-defined low T2 signal and high T1 intensity material lying dependently in the  gallbladder which may reflect a small amount of biliary sludge and/or small gallstones. Gallbladder does not appear distended. No gallbladder wall thickening or significant volume of pericholecystic fluid. Pancreas: Extensive inflammatory changes are noted within and around the pancreas, compatible with an acute pancreatitis. No discrete pancreatic mass. Although there is extensive peripancreatic fluid, no well-defined fluid collection is confidently identified to clearly indicate the presence of a pancreatic pseudocyst. The pancreatic parenchyma appears to enhance normally. No pancreatic ductal dilatation noted on MRCP images. Spleen: The spleen is enlarged measuring 15.1 x 6.4 x 15.0 cm (estimated splenic volume of 725 mL). In addition, the spleen demonstrates diffuse low signal intensity on T2 weighted images, and loss of signal intensity on in phase dual echo images, indicative of significant iron deposition. Adrenals/Urinary Tract: In the upper pole of the left kidney there is an exophytic T1 hypointense, T2 hyperintense, nonenhancing lesion, compatible with a simple cyst. Right kidney and bilateral adrenal glands are normal in appearance. Stomach/Bowel: T2 hyperintense fluid adjacent to portions of the distal stomach and duodenum, presumably related to the adjacent inflammation in the pancreas. Otherwise, visualized portions are unremarkable. Vascular/Lymphatic: No aneurysm identified in the visualized abdominal vasculature. Splenic vein, superior mesenteric vein and portal vein all remain patent at this time. No lymphadenopathy noted in the visualized abdomen. Other: Inflammatory fluid throughout the retroperitoneum most evident adjacent to the pancreas. No significant volume of ascites in the visualized portions of the peritoneal cavity. Musculoskeletal: No aggressive appearing osseous lesions are noted in the visualized portions of the skeleton. IMPRESSION: 1. Acute pancreatitis, without evidence of  pancreatic necrosis, pancreatic pseudocyst or other acute complicating features. 2. Biliary sludge and/or small gallstones lying dependently in the gallbladder. No findings to suggest an acute cholecystitis at this time. 3. Although there is no choledocholithiasis, there is clearly biliary sludge in the common bile duct lying dependently. Despite this, there is no evidence of biliary tract obstruction at this time. Common bile duct measures 6 mm in the porta hepatis. 4. Hepatic steatosis. 5. Iron deposition in the spleen. 6. Cardiomegaly. Electronically Signed   By: Vinnie Langton M.D.   On: 12/13/2018 17:16        Scheduled Meds:  enoxaparin (LOVENOX) injection  40 mg Subcutaneous Q24H   exemestane  25 mg Oral Q lunch   insulin aspart  0-9 Units Subcutaneous Q6H   pantoprazole  40 mg Oral Daily   Continuous Infusions:  lactated ringers 150 mL/hr at 12/15/18 0713   piperacillin-tazobactam (ZOSYN)  IV 3.375 g (12/15/18 0924)     LOS: 2 days    Time spent: 30 minutes  Tahira Olivarez Darleen Crocker, DO [facility] Pager [phone]  If 7PM-7AM, please contact night-coverage [email or web address] Password [facility] 12/15/2018, 9:47 AM

## 2018-12-16 ENCOUNTER — Other Ambulatory Visit: Payer: Self-pay

## 2018-12-16 ENCOUNTER — Encounter (HOSPITAL_COMMUNITY)
Admission: EM | Disposition: A | Discharge status: Home / Self Care | Payer: Self-pay | Source: Home / Self Care | Attending: Internal Medicine

## 2018-12-16 ENCOUNTER — Inpatient Hospital Stay (HOSPITAL_COMMUNITY): Payer: 59

## 2018-12-16 ENCOUNTER — Inpatient Hospital Stay (HOSPITAL_COMMUNITY): Payer: 59 | Admitting: Anesthesiology

## 2018-12-16 ENCOUNTER — Encounter (HOSPITAL_COMMUNITY): Payer: Self-pay | Admitting: Certified Registered Nurse Anesthetist

## 2018-12-16 HISTORY — PX: CHOLECYSTECTOMY: SHX55

## 2018-12-16 LAB — COMPREHENSIVE METABOLIC PANEL
ALT: 99 U/L — ABNORMAL HIGH (ref 0–44)
AST: 37 U/L (ref 15–41)
Albumin: 3 g/dL — ABNORMAL LOW (ref 3.5–5.0)
Alkaline Phosphatase: 118 U/L (ref 38–126)
Anion gap: 12 (ref 5–15)
BUN: <5 mg/dL — ABNORMAL LOW (ref 6–20)
CO2: 23 mmol/L (ref 22–32)
Calcium: 8.8 mg/dL — ABNORMAL LOW (ref 8.9–10.3)
Chloride: 104 mmol/L (ref 98–111)
Creatinine, Ser: 0.62 mg/dL (ref 0.44–1.00)
GFR calc Af Amer: >60 mL/min (ref >60)
GFR calc non Af Amer: >60 mL/min (ref >60)
Glucose, Bld: 103 mg/dL — ABNORMAL HIGH (ref 70–99)
Potassium: 3.5 mmol/L (ref 3.5–5.1)
Sodium: 139 mmol/L (ref 135–145)
Total Bilirubin: 6.3 mg/dL — ABNORMAL HIGH (ref 0.3–1.2)
Total Protein: 6.4 g/dL — ABNORMAL LOW (ref 6.5–8.1)

## 2018-12-16 LAB — CBC
HCT: 29.5 % — ABNORMAL LOW (ref 36.0–46.0)
Hemoglobin: 10.2 g/dL — ABNORMAL LOW (ref 12.0–15.0)
MCH: 32.8 pg (ref 26.0–34.0)
MCHC: 34.6 g/dL (ref 30.0–36.0)
MCV: 94.9 fL (ref 80.0–100.0)
Platelets: 128 10*3/uL — ABNORMAL LOW (ref 150–400)
RBC: 3.11 MIL/uL — ABNORMAL LOW (ref 3.87–5.11)
RDW: 17.2 % — ABNORMAL HIGH (ref 11.5–15.5)
WBC: 7.1 10*3/uL (ref 4.0–10.5)
nRBC: 0 % (ref 0.0–0.2)

## 2018-12-16 LAB — GLUCOSE, CAPILLARY
Glucose-Capillary: 104 mg/dL — ABNORMAL HIGH (ref 70–99)
Glucose-Capillary: 117 mg/dL — ABNORMAL HIGH (ref 70–99)
Glucose-Capillary: 143 mg/dL — ABNORMAL HIGH (ref 70–99)
Glucose-Capillary: 179 mg/dL — ABNORMAL HIGH (ref 70–99)
Glucose-Capillary: 86 mg/dL (ref 70–99)

## 2018-12-16 LAB — LIPASE, BLOOD: Lipase: 128 U/L — ABNORMAL HIGH (ref 11–51)

## 2018-12-16 SURGERY — LAPAROSCOPIC CHOLECYSTECTOMY WITH INTRAOPERATIVE CHOLANGIOGRAM
Anesthesia: General | Site: Abdomen

## 2018-12-16 MED ORDER — ROCURONIUM BROMIDE 100 MG/10ML IV SOLN
INTRAVENOUS | Status: DC | PRN
  Administered 2018-12-16: 10 mg via INTRAVENOUS
  Administered 2018-12-16: 60 mg via INTRAVENOUS

## 2018-12-16 MED ORDER — BUPIVACAINE HCL (PF) 0.25 % IJ SOLN
INTRAMUSCULAR | Status: AC
  Filled 2018-12-16: qty 30

## 2018-12-16 MED ORDER — SODIUM CHLORIDE 0.9 % IV SOLN
INTRAVENOUS | Status: DC | PRN
  Administered 2018-12-16: 20 mL

## 2018-12-16 MED ORDER — PROMETHAZINE HCL 25 MG/ML IJ SOLN: 6.2500 mg | INTRAMUSCULAR | Status: DC | PRN

## 2018-12-16 MED ORDER — STERILE WATER FOR IRRIGATION IR SOLN
Status: DC | PRN
  Administered 2018-12-16: 1000 mL

## 2018-12-16 MED ORDER — ONDANSETRON HCL 4 MG/2ML IJ SOLN
INTRAMUSCULAR | Status: AC
  Filled 2018-12-16: qty 4

## 2018-12-16 MED ORDER — LIDOCAINE 2% (20 MG/ML) 5 ML SYRINGE
INTRAMUSCULAR | Status: AC
  Filled 2018-12-16: qty 10

## 2018-12-16 MED ORDER — ROCURONIUM BROMIDE 10 MG/ML (PF) SYRINGE
PREFILLED_SYRINGE | INTRAVENOUS | Status: AC
  Filled 2018-12-16: qty 20

## 2018-12-16 MED ORDER — 0.9 % SODIUM CHLORIDE (POUR BTL) OPTIME
TOPICAL | Status: DC | PRN
  Administered 2018-12-16: 10:00:00 1000 mL

## 2018-12-16 MED ORDER — ONDANSETRON HCL 4 MG/2ML IJ SOLN
INTRAMUSCULAR | Status: DC | PRN
  Administered 2018-12-16: 4 mg via INTRAVENOUS

## 2018-12-16 MED ORDER — PROPOFOL 10 MG/ML IV BOLUS
INTRAVENOUS | Status: DC | PRN
  Administered 2018-12-16: 190 mg via INTRAVENOUS

## 2018-12-16 MED ORDER — DEXAMETHASONE SODIUM PHOSPHATE 10 MG/ML IJ SOLN
INTRAMUSCULAR | Status: DC | PRN
  Administered 2018-12-16: 10 mg via INTRAVENOUS

## 2018-12-16 MED ORDER — GLYCOPYRROLATE PF 0.2 MG/ML IJ SOSY
PREFILLED_SYRINGE | INTRAMUSCULAR | Status: DC | PRN
  Administered 2018-12-16: .2 mg via INTRAVENOUS

## 2018-12-16 MED ORDER — PROPOFOL 10 MG/ML IV BOLUS
INTRAVENOUS | Status: AC
  Filled 2018-12-16: qty 60

## 2018-12-16 MED ORDER — CELECOXIB 200 MG PO CAPS
200.0000 mg | ORAL_CAPSULE | ORAL | Status: AC
  Administered 2018-12-16: 200 mg via ORAL
  Filled 2018-12-16: qty 1

## 2018-12-16 MED ORDER — LIDOCAINE 2% (20 MG/ML) 5 ML SYRINGE
INTRAMUSCULAR | Status: DC | PRN
  Administered 2018-12-16: 100 mg via INTRAVENOUS

## 2018-12-16 MED ORDER — MIDAZOLAM HCL 5 MG/5ML IJ SOLN
INTRAMUSCULAR | Status: DC | PRN
  Administered 2018-12-16: 2 mg via INTRAVENOUS

## 2018-12-16 MED ORDER — SUGAMMADEX SODIUM 200 MG/2ML IV SOLN
INTRAVENOUS | Status: DC | PRN
  Administered 2018-12-16: 200 mg via INTRAVENOUS

## 2018-12-16 MED ORDER — DIPHENHYDRAMINE HCL 50 MG/ML IJ SOLN
INTRAMUSCULAR | Status: AC
  Filled 2018-12-16: qty 1

## 2018-12-16 MED ORDER — HEMOSTATIC AGENTS (NO CHARGE) OPTIME
TOPICAL | Status: DC | PRN
  Administered 2018-12-16: 1 via TOPICAL

## 2018-12-16 MED ORDER — MIDAZOLAM HCL 2 MG/2ML IJ SOLN
INTRAMUSCULAR | Status: AC
  Filled 2018-12-16: qty 2

## 2018-12-16 MED ORDER — FENTANYL CITRATE (PF) 100 MCG/2ML IJ SOLN
INTRAMUSCULAR | Status: DC | PRN
  Administered 2018-12-16: 100 ug via INTRAVENOUS
  Administered 2018-12-16: 25 ug via INTRAVENOUS

## 2018-12-16 MED ORDER — CHLORHEXIDINE GLUCONATE CLOTH 2 % EX PADS: 6.0000 | MEDICATED_PAD | Freq: Once | CUTANEOUS | Status: DC

## 2018-12-16 MED ORDER — BUPIVACAINE HCL 0.25 % IJ SOLN
INTRAMUSCULAR | Status: DC | PRN
  Administered 2018-12-16: 12 mL

## 2018-12-16 MED ORDER — ACETAMINOPHEN 500 MG PO TABS
1000.0000 mg | ORAL_TABLET | ORAL | Status: AC
  Administered 2018-12-16: 1000 mg via ORAL
  Filled 2018-12-16: qty 2

## 2018-12-16 MED ORDER — PHENYLEPHRINE 40 MCG/ML (10ML) SYRINGE FOR IV PUSH (FOR BLOOD PRESSURE SUPPORT)
PREFILLED_SYRINGE | INTRAVENOUS | Status: DC | PRN
  Administered 2018-12-16 (×2): 80 ug via INTRAVENOUS

## 2018-12-16 MED ORDER — SODIUM CHLORIDE 0.9 % IR SOLN
Status: DC | PRN
  Administered 2018-12-16 (×2): 1000 mL

## 2018-12-16 MED ORDER — DEXAMETHASONE SODIUM PHOSPHATE 10 MG/ML IJ SOLN
INTRAMUSCULAR | Status: AC
  Filled 2018-12-16: qty 2

## 2018-12-16 MED ORDER — CELECOXIB 400 MG PO CAPS: 400.0000 mg | ORAL_CAPSULE | Freq: Once | ORAL | Status: DC

## 2018-12-16 MED ORDER — SCOPOLAMINE 1 MG/3DAYS TD PT72
1.0000 | MEDICATED_PATCH | TRANSDERMAL | Status: DC
  Administered 2018-12-16: 1.5 mg via TRANSDERMAL
  Filled 2018-12-16: qty 1

## 2018-12-16 MED ORDER — CHLORHEXIDINE GLUCONATE CLOTH 2 % EX PADS
6.0000 | MEDICATED_PAD | Freq: Once | CUTANEOUS | Status: DC
  Administered 2018-12-16: 6 via TOPICAL

## 2018-12-16 MED ORDER — ACETAMINOPHEN 500 MG PO TABS: 1000.0000 mg | ORAL_TABLET | Freq: Once | ORAL | Status: DC

## 2018-12-16 MED ORDER — FENTANYL CITRATE (PF) 250 MCG/5ML IJ SOLN
INTRAMUSCULAR | Status: AC
  Filled 2018-12-16: qty 5

## 2018-12-16 MED ORDER — EPHEDRINE 5 MG/ML INJ
INTRAVENOUS | Status: AC
  Filled 2018-12-16: qty 10

## 2018-12-16 MED ORDER — PHENYLEPHRINE 40 MCG/ML (10ML) SYRINGE FOR IV PUSH (FOR BLOOD PRESSURE SUPPORT)
PREFILLED_SYRINGE | INTRAVENOUS | Status: AC
  Filled 2018-12-16: qty 10

## 2018-12-16 MED ORDER — GABAPENTIN 300 MG PO CAPS
300.0000 mg | ORAL_CAPSULE | ORAL | Status: AC
  Administered 2018-12-16: 300 mg via ORAL
  Filled 2018-12-16: qty 1

## 2018-12-16 MED ORDER — LACTATED RINGERS IV SOLN
INTRAVENOUS | Status: DC
  Administered 2018-12-16: 09:00:00 via INTRAVENOUS

## 2018-12-16 MED ORDER — FENTANYL CITRATE (PF) 100 MCG/2ML IJ SOLN: 25.0000 ug | INTRAMUSCULAR | Status: DC | PRN

## 2018-12-16 MED ORDER — OXYCODONE-ACETAMINOPHEN 5-325 MG PO TABS
1.0000 | ORAL_TABLET | ORAL | Status: DC | PRN
  Administered 2018-12-17: 1 via ORAL
  Filled 2018-12-16 (×2): qty 1

## 2018-12-16 SURGICAL SUPPLY — 46 items
ADH SKN CLS APL DERMABOND .7 (GAUZE/BANDAGES/DRESSINGS) ×1
APL PRP STRL LF DISP 70% ISPRP (MISCELLANEOUS) ×1
APPLIER CLIP ROT 10 11.4 M/L (STAPLE) ×2
APR CLP MED LRG 11.4X10 (STAPLE) ×1
BAG SPEC RTRVL 10 TROC 200 (ENDOMECHANICALS) ×1
BLADE CLIPPER SURG (BLADE) IMPLANT
CANISTER SUCT 3000ML PPV (MISCELLANEOUS) ×2 IMPLANT
CHLORAPREP W/TINT 26 (MISCELLANEOUS) ×2 IMPLANT
CLIP APPLIE ROT 10 11.4 M/L (STAPLE) ×1 IMPLANT
COVER MAYO STAND STRL (DRAPES) ×2 IMPLANT
COVER SURGICAL LIGHT HANDLE (MISCELLANEOUS) ×2 IMPLANT
COVER WAND RF STERILE (DRAPES) IMPLANT
DERMABOND ADVANCED (GAUZE/BANDAGES/DRESSINGS) ×1
DERMABOND ADVANCED .7 DNX12 (GAUZE/BANDAGES/DRESSINGS) ×1 IMPLANT
DRAPE C-ARM 42X120 X-RAY (DRAPES) ×2 IMPLANT
DRAPE WARM FLUID 44X44 (DRAPES) ×1 IMPLANT
ELECT REM PT RETURN 9FT ADLT (ELECTROSURGICAL) ×2
ELECTRODE REM PT RTRN 9FT ADLT (ELECTROSURGICAL) ×1 IMPLANT
GLOVE BIO SURGEON STRL SZ8 (GLOVE) ×2 IMPLANT
GLOVE BIOGEL PI IND STRL 8 (GLOVE) ×1 IMPLANT
GLOVE BIOGEL PI INDICATOR 8 (GLOVE) ×1
GOWN STRL REUS W/ TWL LRG LVL3 (GOWN DISPOSABLE) ×2 IMPLANT
GOWN STRL REUS W/ TWL XL LVL3 (GOWN DISPOSABLE) ×1 IMPLANT
GOWN STRL REUS W/TWL LRG LVL3 (GOWN DISPOSABLE) ×4
GOWN STRL REUS W/TWL XL LVL3 (GOWN DISPOSABLE) ×2
HEMOSTAT SNOW SURGICEL 2X4 (HEMOSTASIS) ×1 IMPLANT
KIT BASIN OR (CUSTOM PROCEDURE TRAY) ×2 IMPLANT
KIT TURNOVER KIT B (KITS) ×2 IMPLANT
NS IRRIG 1000ML POUR BTL (IV SOLUTION) ×2 IMPLANT
PAD ARMBOARD 7.5X6 YLW CONV (MISCELLANEOUS) ×2 IMPLANT
POUCH RETRIEVAL ECOSAC 10 (ENDOMECHANICALS) ×1 IMPLANT
POUCH RETRIEVAL ECOSAC 10MM (ENDOMECHANICALS) ×1
SCISSORS LAP 5X35 DISP (ENDOMECHANICALS) ×2 IMPLANT
SET CHOLANGIOGRAPH 5 50 .035 (SET/KITS/TRAYS/PACK) ×2 IMPLANT
SET IRRIG TUBING LAPAROSCOPIC (IRRIGATION / IRRIGATOR) ×2 IMPLANT
SET TUBE SMOKE EVAC HIGH FLOW (TUBING) ×2 IMPLANT
SLEEVE ENDOPATH XCEL 5M (ENDOMECHANICALS) ×2 IMPLANT
SPECIMEN JAR SMALL (MISCELLANEOUS) ×2 IMPLANT
SUT MNCRL AB 4-0 PS2 18 (SUTURE) ×4 IMPLANT
TOWEL GREEN STERILE (TOWEL DISPOSABLE) ×2 IMPLANT
TOWEL GREEN STERILE FF (TOWEL DISPOSABLE) ×2 IMPLANT
TRAY LAPAROSCOPIC MC (CUSTOM PROCEDURE TRAY) ×2 IMPLANT
TROCAR XCEL BLUNT TIP 100MML (ENDOMECHANICALS) ×2 IMPLANT
TROCAR XCEL NON-BLD 11X100MML (ENDOMECHANICALS) ×2 IMPLANT
TROCAR XCEL NON-BLD 5MMX100MML (ENDOMECHANICALS) ×2 IMPLANT
WATER STERILE IRR 1000ML POUR (IV SOLUTION) ×2 IMPLANT

## 2018-12-16 NOTE — Anesthesia Postprocedure Evaluation (Signed)
Anesthesia Post Note  Patient: Mary Dougherty  Procedure(s) Performed: LAPAROSCOPIC CHOLECYSTECTOMY WITH INTRAOPERATIVE CHOLANGIOGRAM (N/A Abdomen)     Patient location during evaluation: PACU Anesthesia Type: General Level of consciousness: awake and alert Pain management: pain level controlled Vital Signs Assessment: post-procedure vital signs reviewed and stable Respiratory status: spontaneous breathing, nonlabored ventilation, respiratory function stable and patient connected to nasal cannula oxygen Cardiovascular status: blood pressure returned to baseline and stable Postop Assessment: no apparent nausea or vomiting Anesthetic complications: no    Last Vitals:  Vitals:   12/16/18 1214 12/16/18 1809  BP: (!) 106/57 128/65  Pulse: 62 80  Resp: (!) 22 20  Temp:  36.7 C  SpO2: 96% 92%    Last Pain:  Vitals:   12/16/18 1809  TempSrc: Oral  PainSc:                  Jaxtin Raimondo

## 2018-12-16 NOTE — Progress Notes (Signed)
Report given to Manuela Schwartz RN in the [facility]

## 2018-12-16 NOTE — Progress Notes (Signed)
PROGRESS NOTE    Mary Dougherty  [id number] DOB: 03/03/1970 DOA: 12/13/2018 PCP: Forrest Moron, MD   Brief Narrative:  Per HPI: Mary Jude Townsendis a 48 y.o.femalewith medical history significant ofanemia with hereditary spherocytosis, diabetes mellitus type 2, breast cancer(s/plumpectomy, chemotherapy, and radiation), who presents with complaints of epigastric abdominal pain. Symptoms initially started around 4 PM 2 days ago after she had eaten at 2 PM and then had a milkshake. Describes the pain as sharp with radiation to her back. She initially thought symptoms were related to gas or possibly a heart attack as heart disease runs in her family. She took Tums and a baby aspirin without significant change in symptoms. She came to the emergency department for further evaluation and had a EKG done, but due to the wait time left before being fully evaluated. She followed up with her primary care provider yesterday where lab work was obtained as she was found to have yellowing of her eyes. She continued to have epigastric abdominal pain and reported that her stools were very light in color. Patient denies having any significant fever, shortness of breath, nausea, vomiting, diarrhea, or dysuria.  11/11: Patient admitted for cholelithiasis and pancreatitis with improvement in symptoms this am. MRCP with no definitive obstruction noted and GI has consulted GS for lap chole and IOC. CLD today. Continue IVF and trend labs.  11/12: Patient eager to have surgery done today.  She is overall feeling much better.  LFTs are downtrending.  General surgery plans for lap chole with IOC in a.m.  11/13: Plans for lap chole with IOC today.  Anticipate discharge in a.m. if stable.  Assessment & Plan:   Principal Problem:   Acute biliary pancreatitis Active Problems:   Anemia   Type 2 diabetes mellitus with complication, without long-term current use of insulin (HCC)   Hyperbilirubinemia  Transaminitis   Leukocytosis   GERD (gastroesophageal reflux disease)   Abdominal pain   Abnormal CT of the abdomen  Transaminitis and hyperbilirubinemia secondary to suspectBilary pancreatitis:Patient presents with complaints of abdominal pain. Lipase 2605, total bilirubin 13.3, alkaline phosphatase 134, AST 98, and ALT 226. Due to imaging studies confirmed signs of pancreatitis with biliary sludge and stones seen. -LFTs downtrending with sludge on CBC noted on MRCP -Plan for lap chole today with IOC -Lactated Ringer's at 150 mL/h -Pain control -Incentive spirometry -Appreciate GI consultative services, we will follow-up for further recommendations -Trend LFTs, which are trending down  Cholelithiasis: Imaging studies revealed sludge and possible small stones within the gallbladder with signs of intraluminal gas. -IV antibiotics of Zosyn -General Surgery consult per GI with lap chole and IOC planned for today  Leukocytosis: Acute. -Currently resolved -Recheck in a.m.  Anemia: Hemoglobin 11.6 which appears near patient's baseline. Patient denies any reports of bleeding. -Continue to monitor  Diabetes mellitus type 2: Patient reports history of diabetes and currently onmetforminfor treatment. Last hemoglobin A1c noted to be 7.8 on 03/26/2018  -A1c 5.1% -Hypoglycemic protocol -Hold Metformin -Sensitive SSI  History of breast cancer: Patient -Continue Aromasin  GERD -Continue pharmacy substitution for omeprazole   DVT prophylaxis:Lovenox Code Status:Full Family Communication:None at bedside Disposition Plan:Continue management per GI and GS with plans for lap chole and IOC today.  Anticipate discharge in a.m. if okay per GS.   Consultants:  GI  GS  Procedures:  MRCP 11/10  Antimicrobials:  Anti-infectives (From admission, onward)   Start     Dose/Rate Route Frequency Ordered Stop   12/13/18 1700  [  MAR Hold]  piperacillin-tazobactam  (ZOSYN) IVPB 3.375 g     (MAR Hold since Fri 12/16/2018 at 0849.Hold Reason: Transfer to a Procedural area.)   3.375 g 12.5 mL/hr over 240 Minutes Intravenous Every 8 hours 12/13/18 0945     12/13/18 1400  piperacillin-tazobactam (ZOSYN) IVPB 3.375 g  Status:  Discontinued     3.375 g 100 mL/hr over 30 Minutes Intravenous Every 8 hours 12/13/18 0940 12/13/18 0945   12/13/18 0800  piperacillin-tazobactam (ZOSYN) IVPB 3.375 g     3.375 g 100 mL/hr over 30 Minutes Intravenous  Once 12/13/18 0745 12/13/18 0916       Subjective: Patient seen and evaluated today with no new acute complaints or concerns. No acute concerns or events noted overnight.  She denies any abdominal pain, nausea, or vomiting.  She is looking forward to her surgery today.  Objective: Vitals:   12/16/18 0038 12/16/18 0543 12/16/18 0843 12/16/18 0904  BP: (!) 107/55 105/60    Pulse: 69 67    Resp: 18 18    Temp: 98.3 F (36.8 C) 98.1 F (36.7 C)    TempSrc: Oral Oral    SpO2: 97% 97%    Weight:   68.9 kg 82.6 kg  Height:   5\' 5"  (1.651 m)    No intake or output data in the 24 hours ending 12/16/18 1039 Filed Weights   12/16/18 0843 12/16/18 0904  Weight: 68.9 kg 82.6 kg    Examination:  General exam: Appears calm and comfortable  Respiratory system: Clear to auscultation. Respiratory effort normal. Cardiovascular system: S1 & S2 heard, RRR. No JVD, murmurs, rubs, gallops or clicks. No pedal edema. Gastrointestinal system: Abdomen is nondistended, soft and nontender. No organomegaly or masses felt. Normal bowel sounds heard. Central nervous system: Alert and oriented. No focal neurological deficits. Extremities: Symmetric 5 x 5 power. Skin: No rashes, lesions or ulcers Psychiatry: Judgement and insight appear normal. Mood & affect appropriate.     Data Reviewed: I have personally reviewed following labs and imaging studies  CBC: Recent Labs  Lab 12/11/18 1743 12/13/18 0553 12/14/18 0515 12/15/18  0651 12/16/18 0505  WBC 12.9* 11.9* 9.4 7.9 7.1  HGB 11.1* 11.6* 11.1* 10.3* 10.2*  HCT 31.5* 33.2* 31.8* 29.2* 29.5*  MCV 92.1 92.0 93.5 92.7 94.9  PLT 174 164 146* 124* 0000000*   Basic Metabolic Panel: Recent Labs  Lab 12/12/18 1712 12/13/18 0553 12/13/18 0754 12/14/18 0515 12/15/18 0651 12/16/18 0505  NA 139 135  --  139 139 139  K 3.9 3.7  --  3.5 3.4* 3.5  CL 102 104  --  105 106 104  CO2 22 21*  --  22 25 23   GLUCOSE 159* 245*  --  89 128* 103*  BUN 9 9  --  5* <5* <5*  CREATININE 0.71 0.65  --  0.64 0.64 0.62  CALCIUM 9.2 9.1  --  8.9 8.9 8.8*  MG  --   --  1.8  --   --   --   PHOS  --   --  4.2  --   --   --    GFR: Estimated Creatinine Clearance: 89.3 mL/min (by C-G formula based on SCr of 0.62 mg/dL). Liver Function Tests: Recent Labs  Lab 12/12/18 1712 12/13/18 0553 12/14/18 0515 12/15/18 0651 12/16/18 0505  AST 114* 98* 57* 45* 37  ALT 237* 226* 159* 120* 99*  ALKPHOS 161* 136* 121 115 118  BILITOT 9.1* 13.3* 13.1* 9.6*  6.3*  PROT 7.3 7.4 6.8 6.4* 6.4*  ALBUMIN 4.6 3.9 3.4* 3.1* 3.0*   Recent Labs  Lab 12/11/18 1743 12/13/18 0553 12/15/18 0651 12/16/18 0505  LIPASE 28 2,605* 205* 128*   No results for input(s): AMMONIA in the last 168 hours. Coagulation Profile: No results for input(s): INR, PROTIME in the last 168 hours. Cardiac Enzymes: No results for input(s): CKTOTAL, CKMB, CKMBINDEX, TROPONINI in the last 168 hours. BNP (last 3 results) No results for input(s): PROBNP in the last 8760 hours. HbA1C: Recent Labs    12/14/18 0515  HGBA1C 5.1   CBG: Recent Labs  Lab 12/15/18 1035 12/15/18 1705 12/16/18 0044 12/16/18 0548 12/16/18 0915  GLUCAP 202* 149* 86 104* 117*   Lipid Profile: No results for input(s): CHOL, HDL, LDLCALC, TRIG, CHOLHDL, LDLDIRECT in the last 72 hours. Thyroid Function Tests: No results for input(s): TSH, T4TOTAL, FREET4, T3FREE, THYROIDAB in the last 72 hours. Anemia Panel: No results for input(s):  VITAMINB12, FOLATE, FERRITIN, TIBC, IRON, RETICCTPCT in the last 72 hours. Sepsis Labs: Recent Labs  Lab 12/13/18 0753 12/13/18 1750  LATICACIDVEN 1.2 0.8    Recent Results (from the past 240 hour(s))  Blood culture (routine x 2)     Status: None (Preliminary result)   Collection Time: 12/13/18  8:30 AM   Specimen: BLOOD LEFT HAND  Result Value Ref Range Status   Specimen Description BLOOD LEFT HAND  Final   Special Requests   Final    BOTTLES DRAWN AEROBIC AND ANAEROBIC Blood Culture results may not be optimal due to an inadequate volume of blood received in culture bottles   Culture   Final    NO GROWTH 2 DAYS Performed at [facility], [place] [address], [place], [place] [address]    Report Status PENDING  Incomplete  SARS CORONAVIRUS 2 (TAT 6-24 HRS) Nasopharyngeal Nasopharyngeal Swab     Status: None   Collection Time: 12/13/18  8:30 AM   Specimen: Nasopharyngeal Swab  Result Value Ref Range Status   SARS Coronavirus 2 NEGATIVE NEGATIVE Final    Comment: (NOTE) SARS-CoV-2 target nucleic acids are NOT DETECTED. The SARS-CoV-2 RNA is generally detectable in upper and lower respiratory specimens during the acute phase of infection. Negative results do not preclude SARS-CoV-2 infection, do not rule out co-infections with other pathogens, and should not be used as the sole basis for treatment or other patient management decisions. Negative results must be combined with clinical observations, patient history, and epidemiological information. The expected result is Negative. Fact Sheet for Patients: SugarRoll.be Fact Sheet for Healthcare Providers: https://www.woods-mathews.com/ This test is not yet approved or cleared by the [place] FDA and  has been authorized for detection and/or diagnosis of SARS-CoV-2 by FDA under an Emergency Use Authorization (EUA). This EUA will remain  in effect (meaning this test can be used) for the  duration of the COVID-19 declaration under Section 56 4(b)(1) of the Act, 21 U.S.C. section 360bbb-3(b)(1), unless the authorization is terminated or revoked sooner. Performed at [facility], [place] [address], [place], [place] [address]   Blood culture (routine x 2)     Status: None (Preliminary result)   Collection Time: 12/13/18  8:53 AM   Specimen: BLOOD LEFT FOREARM  Result Value Ref Range Status   Specimen Description BLOOD LEFT FOREARM  Final   Special Requests   Final    BOTTLES DRAWN AEROBIC AND ANAEROBIC Blood Culture adequate volume   Culture   Final  NO GROWTH 2 DAYS Performed at [facility], [place] [address], [place], [place] [address]    Report Status PENDING  Incomplete  Surgical pcr screen     Status: None   Collection Time: 12/15/18  6:44 AM   Specimen: Nasal Mucosa; Nasal Swab  Result Value Ref Range Status   MRSA, PCR NEGATIVE NEGATIVE Final   Staphylococcus aureus NEGATIVE NEGATIVE Final    Comment: (NOTE) The Xpert SA Assay (FDA approved for NASAL specimens in patients 41 years of age and older), is one component of a comprehensive surveillance program. It is not intended to diagnose infection nor to guide or monitor treatment. Performed at [facility], [place] [address] [place], [place] [address]          Radiology Studies: No results found.      Scheduled Meds: . Chlorhexidine Gluconate Cloth  6 each Topical Once   And  . Chlorhexidine Gluconate Cloth  6 each Topical Once  . [MAR Hold] enoxaparin (LOVENOX) injection  40 mg Subcutaneous Q24H  . [MAR Hold] exemestane  25 mg Oral Q lunch  . [MAR Hold] insulin aspart  0-9 Units Subcutaneous Q6H  . [MAR Hold] pantoprazole  40 mg Oral Daily  . scopolamine  1 patch Transdermal Q72H   Continuous Infusions: . lactated ringers 150 mL/hr at 12/16/18 0119  . lactated ringers 10 mL/hr at 12/16/18 0909  . [MAR Hold] piperacillin-tazobactam (ZOSYN)  IV 3.375 g (12/16/18 0120)      LOS: 3 days    Time spent: 65 minutes    Adilee Lemme Darleen Crocker, DO [facility] Pager [phone]  If 7PM-7AM, please contact night-coverage [email or web address] Password [facility] 12/16/2018, 10:39 AM

## 2018-12-16 NOTE — Op Note (Signed)
Laparoscopic Cholecystectomy with IOC Procedure Note  Indications: This patient presents with symptomatic gallbladder disease and will undergo laparoscopic cholecystectomy. The procedure has been discussed with the patient. Operative and non operative treatments have been discussed. Risks of surgery include bleeding, infection,  Common bile duct injury,  Injury to the stomach,liver, colon,small intestine, abdominal wall,  Diaphragm,  Major blood vessels,  And the need for an open procedure.  Other risks include worsening of medical problems, death,  DVT and pulmonary embolism, and cardiovascular events.   Medical options have also been discussed. The patient has been informed of long term expectations of surgery and non surgical options,  The patient agrees to proceed.    Pre-operative Diagnosis: gallstone pancreatitis  Post-operative Diagnosis: Same  Surgeon: Turner Daniels  MD  Assistants: [facility] RNFA   Anesthesia: General endotracheal anesthesia and Local anesthesia 0.5% bupivacaine  ASA Class: 2  Procedure Details  The patient was seen again in the Holding Room. The risks, benefits, complications, treatment options, and expected outcomes were discussed with the patient. The possibilities of reaction to medication, pulmonary aspiration, perforation of viscus, bleeding, recurrent infection, finding a normal gallbladder, the need for additional procedures, failure to diagnose a condition, the possible need to convert to an open procedure, and creating a complication requiring transfusion or operation were discussed with the patient. The patient and/or family concurred with the proposed plan, giving informed consent. The site of surgery properly noted/marked. The patient was taken to Operating Room, identified as Mary Dougherty and the procedure verified as Laparoscopic Cholecystectomy with Intraoperative Cholangiograms. A Time Out was held and the above information confirmed.  Prior to  the induction of general anesthesia, antibiotic prophylaxis was administered. General endotracheal anesthesia was then administered and tolerated well. After the induction, the abdomen was prepped in the usual sterile fashion. The patient was positioned in the supine position with the left arm comfortably tucked, along with some reverse Trendelenburg.  Local anesthetic agent was injected into the skin near the umbilicus and an incision made. The midline fascia was incised and the Hasson technique was used to introduce a 12 mm port under direct vision. It was secured with a figure of eight Vicryl suture placed in the usual fashion. Pneumoperitoneum was then created with CO2 and tolerated well without any adverse changes in the patient's vital signs. Additional trocars were introduced under direct vision with an 11 mm trocar in the epigastrium and 2 5 mm trocars in the right upper quadrant. All skin incisions were infiltrated with a local anesthetic agent before making the incision and placing the trocars.   The gallbladder was identified, the fundus grasped and retracted cephalad. Adhesions were lysed bluntly and with the electrocautery where indicated, taking care not to injure any adjacent organs or viscus. The infundibulum was grasped and retracted laterally, exposing the peritoneum overlying the triangle of Calot. This was then divided and exposed in a blunt fashion. The cystic duct was clearly identified and bluntly dissected circumferentially. The junctions of the gallbladder, cystic duct and common bile duct were clearly identified prior to the division of any linear structure.   An incision was made in the cystic duct and the cholangiogram catheter introduced. The catheter was secured using an endoclip. The study showed no stones and good visualization of the distal and proximal biliary tree. The catheter was then removed.   The cystic duct was then  ligated with surgical clips  on the patient side  and  clipped on the gallbladder  side and divided. The cystic artery was identified, dissected free, ligated with clips and divided as well. Posterior cystic artery clipped and divided.  The gallbladder was dissected from the liver bed in retrograde fashion with the electrocautery. The gallbladder was removed. The liver bed was irrigated and inspected. Hemostasis was achieved with the electrocautery. Copious irrigation was utilized and was repeatedly aspirated until clear all particulate matter. Hemostasis was achieved with no signs  Of bleeding or bile leakage.  Pneumoperitoneum was completely reduced after viewing removal of the trocars under direct vision. The wound was thoroughly irrigated and the fascia was then closed with a figure of eight suture; the skin was then closed with 4 O MONOCRYL  and a sterile dressing was applied.  Instrument, sponge, and needle counts were correct at closure and at the conclusion of the case.   Findings: Cholecystitis with Cholelithiasis  Estimated Blood Loss: less than 50 mL         Drains: NONE          Total IV Fluids: PER RECORD          Specimens: Gallbladder           Complications: None; patient tolerated the procedure well.         Disposition: PACU - hemodynamically stable.         Condition: stable

## 2018-12-16 NOTE — Transfer of Care (Signed)
Immediate Anesthesia Transfer of Care Note  Patient: Mary Dougherty  Procedure(s) Performed: LAPAROSCOPIC CHOLECYSTECTOMY WITH INTRAOPERATIVE CHOLANGIOGRAM (N/A Abdomen)  Patient Location: PACU  Anesthesia Type:General  Level of Consciousness: awake, alert  and oriented  Airway & Oxygen Therapy: Patient Spontanous Breathing and Patient connected to face mask oxygen  Post-op Assessment: Report given to RN and Post -op Vital signs reviewed and stable  Post vital signs: Reviewed and stable  Last Vitals:  Vitals Value Taken Time  BP 110/44 12/16/18 1127  Temp 36.8 C 12/16/18 1127  Pulse 76 12/16/18 1128  Resp 16 12/16/18 1128  SpO2 93 % 12/16/18 1128  Vitals shown include unvalidated device data.  Last Pain:  Vitals:   12/16/18 1127  TempSrc:   PainSc: (P) 6       Patients Stated Pain Goal: (P) 4 ([id number])  Complications: No apparent anesthesia complications

## 2018-12-16 NOTE — Anesthesia Procedure Notes (Signed)
Procedure Name: Intubation Date/Time: 12/16/2018 9:58 AM Performed by: Candis Shine, CRNA Pre-anesthesia Checklist: Patient identified, Emergency Drugs available, Suction available and Patient being monitored Patient Re-evaluated:Patient Re-evaluated prior to induction Oxygen Delivery Method: Circle System Utilized Preoxygenation: Pre-oxygenation with 100% oxygen Induction Type: IV induction Ventilation: Mask ventilation without difficulty Laryngoscope Size: Mac and 3 Grade View: Grade I Tube type: Oral Number of attempts: 1 Airway Equipment and Method: Stylet Placement Confirmation: ETT inserted through vocal cords under direct vision,  positive ETCO2 and breath sounds checked- equal and bilateral Secured at: 22 cm Tube secured with: Tape Dental Injury: Teeth and Oropharynx as per pre-operative assessment

## 2018-12-17 LAB — COMPREHENSIVE METABOLIC PANEL
ALT: 111 U/L — ABNORMAL HIGH (ref 0–44)
AST: 59 U/L — ABNORMAL HIGH (ref 15–41)
Albumin: 2.9 g/dL — ABNORMAL LOW (ref 3.5–5.0)
Alkaline Phosphatase: 108 U/L (ref 38–126)
Anion gap: 11 (ref 5–15)
BUN: 7 mg/dL (ref 6–20)
CO2: 25 mmol/L (ref 22–32)
Calcium: 8.7 mg/dL — ABNORMAL LOW (ref 8.9–10.3)
Chloride: 102 mmol/L (ref 98–111)
Creatinine, Ser: 0.64 mg/dL (ref 0.44–1.00)
GFR calc Af Amer: >60 mL/min (ref >60)
GFR calc non Af Amer: >60 mL/min (ref >60)
Glucose, Bld: 162 mg/dL — ABNORMAL HIGH (ref 70–99)
Potassium: 3.9 mmol/L (ref 3.5–5.1)
Sodium: 138 mmol/L (ref 135–145)
Total Bilirubin: 4.3 mg/dL — ABNORMAL HIGH (ref 0.3–1.2)
Total Protein: 6.5 g/dL (ref 6.5–8.1)

## 2018-12-17 LAB — CBC
HCT: 31.3 % — ABNORMAL LOW (ref 36.0–46.0)
Hemoglobin: 10.7 g/dL — ABNORMAL LOW (ref 12.0–15.0)
MCH: 32.3 pg (ref 26.0–34.0)
MCHC: 34.2 g/dL (ref 30.0–36.0)
MCV: 94.6 fL (ref 80.0–100.0)
Platelets: 174 10*3/uL (ref 150–400)
RBC: 3.31 MIL/uL — ABNORMAL LOW (ref 3.87–5.11)
RDW: 17.2 % — ABNORMAL HIGH (ref 11.5–15.5)
WBC: 12.9 10*3/uL — ABNORMAL HIGH (ref 4.0–10.5)
nRBC: 0 % (ref 0.0–0.2)

## 2018-12-17 LAB — GLUCOSE, CAPILLARY
Glucose-Capillary: 137 mg/dL — ABNORMAL HIGH (ref 70–99)
Glucose-Capillary: 176 mg/dL — ABNORMAL HIGH (ref 70–99)
Glucose-Capillary: 209 mg/dL — ABNORMAL HIGH (ref 70–99)

## 2018-12-17 MED ORDER — ONDANSETRON HCL 4 MG/2ML IJ SOLN: 4.0000 mg | Freq: Four times a day (QID) | INTRAMUSCULAR | 0 refills | Status: DC | PRN

## 2018-12-17 MED ORDER — ACETAMINOPHEN 325 MG PO TABS: 650.0000 mg | ORAL_TABLET | Freq: Four times a day (QID) | ORAL | 0 refills | Status: DC | PRN

## 2018-12-17 NOTE — Discharge Summary (Signed)
Mary Dougherty, is a 48 y.o. female  DOB [birth year]  MRN [id number].  Admission date:  12/13/2018  Admitting Physician  Norval Morton, MD  Discharge Date:  12/17/2018   Primary MD  Forrest Moron, MD  Recommendations for primary care physician for things to follow:   CBC, CMP , PANCREATIC ENZYMES   Admission Diagnosis  Pancreatitis [K85.90] Abdominal pain [R10.9]   Discharge Diagnosis  Pancreatitis [K85.90] Abdominal pain [R10.9]    Principal Problem:   Acute biliary pancreatitis Active Problems:   Anemia   Type 2 diabetes mellitus with complication, without long-term current use of insulin (HCC)   Hyperbilirubinemia   Transaminitis   Leukocytosis   GERD (gastroesophageal reflux disease)   Abdominal pain   Abnormal CT of the abdomen      Past Medical History:  Diagnosis Date   Anemia    Blood transfusion without reported diagnosis    Chronic anemia 03/03/13   Dyspnea    with exertion, during chemo   Family history of adverse reaction to anesthesia     father- hard to awaken   Hereditary spherocytosis ([place])    Invasive ductal carcinoma of right breast ([place]) 09/2012   ER(99%), PR(100%), Ki-67(70%), 1/5 nodes positive   Pre-diabetes    off metformin now   Recurrent breast cancer, right ([place]) 09/2017   S/P radiation therapy 05/08/2013-06/22/2013   1) Right breast / 50.4 Gy in 28 fractions / 2) Right Supraclavicular fossa/ 50.4 Gy in 28 fractions /3) Right Posterior Axillary boost / 11.9 Gy in 28 fractions /4) Right breast boost / 10 Gy in 5 fractions    Spherocytosis ([place]) 1989   Status post chemotherapy 10/07/12 - 12/16/12    Neoadjuvant chemotherapy with dose dense FEC (5-FU/epirubicin/Cytoxan) from 10/07/2012 through 12/16/12.     Syncope and collapse 1989   "thats when I found out I have Spherocytosis"   Thyroid disease    Use of exemestane (Aromasin) 07/25/13    Use of goserelin acetate (Zoladex) 07/25/13   Wears glasses     Past Surgical History:  Procedure Laterality Date   BREAST LUMPECTOMY WITH SENTINEL LYMPH NODE BIOPSY Right 04/06/2013   [name]   BREAST SURGERY     left breast lumpectomy/left ax snbx   COLONOSCOPY  2015   MASTECTOMY W/ SENTINEL NODE BIOPSY Right 10/05/2017   Procedure: RIGHT MASTECTOMY WITH RIGHT SENTINEL LYMPH NODE BIOPSY;  Surgeon: [name], MD;  Location: [facility];  Service: General;  Laterality: Right;   PORT-A-CATH REMOVAL N/A 04/06/2013   Procedure: REMOVAL PORT-A-CATH;  Surgeon: [name], MD;  Location: [facility];  Service: General;  Laterality: N/A;   PORT-A-CATH REMOVAL N/A 11/08/2017   Procedure: REMOVAL PORT-A-CATH;  Surgeon: Greer Pickerel, MD;  Location: [facility];  Service: General;  Laterality: N/A;   PORTACATH PLACEMENT Left 09/20/2012   Procedure: INSERTION PORT-A-CATH;  Surgeon: [name], MD;  Location: [facility];  Service: General;  Laterality: Left;   PORTACATH PLACEMENT Right 05/18/2017  Procedure: INSERTION PORT-A-CATH WITH ULTRASOUND;  Surgeon: [name], MD;  Location: [facility];  Service: General;  Laterality: Right;   ROBOTIC ASSISTED LAPAROSCOPIC LYSIS OF ADHESION  11/08/2017   Procedure: XI ROBOTIC ASSISTED LAPAROSCOPIC LYSIS OF BOWEL ADHESIONS;  Surgeon: Brien Few, MD;  Location: [facility];  Service: Gynecology;;   ROBOTIC ASSISTED TOTAL HYSTERECTOMY WITH BILATERAL SALPINGO OOPHERECTOMY Bilateral 11/08/2017   Procedure: XI ROBOTIC ASSISTED TOTAL HYSTERECTOMY WITH BILATERAL SALPINGO OOPHORECTOMY;  Surgeon: Brien Few, MD;  Location: [facility];  Service: Gynecology;  Laterality: Bilateral;  [place] for 23 hr obs.   WISDOM TOOTH EXTRACTION         HPI  from the history and physical done on the day of admission:    Mary Dougherty is a 48 y.o. female with medical history significant of anemia with  hereditary spherocytosis, diabetes mellitus type 2, breast cancer(s/p lumpectomy, chemotherapy, and radiation), who presents with complaints of epigastric abdominal pain.  Symptoms initially started around 4 PM 2 days ago after she had eaten at 2 PM and then had a milkshake.  Describes the pain as sharp with radiation to her back.  She initially thought symptoms were related to gas or possibly a heart attack as heart disease runs in her family.  She took Tums and a baby aspirin without significant change in symptoms.  She came to the emergency department for further evaluation and had a EKG done, but due to the wait time left before being fully evaluated. She followed up with her primary care provider yesterday where lab work was obtained as she was found to have yellowing of her eyes.  She continued to have epigastric abdominal pain and reported that her stools were very light in color.  Patient denies having any significant fever, shortness of breath, nausea, vomiting, diarrhea, or dysuria.  Upon admission into the emergency department patient was noted to have stable vital signs.  Labs significant for WBC 11.9, hemoglobin 11.6, lipase 2605, alkaline phosphatase 136, AST 98, ALT 226, and total bilirubin 13.3.  Ultrasound revealed sludge and possible stones in the gallbladder.  CT scan of the abdomen and pelvis revealed pancreatitis.  [facility] was consulted.  Patient was given antibiotics of Zosyn, 2 L of IV fluids, antiemetics and pain medication.  Review of Systems  Constitutional: Negative for fever.  HENT: Negative for hearing loss.   Eyes: Negative for photophobia and pain.  Respiratory: Negative for cough and shortness of breath.   Cardiovascular: Negative for chest pain and leg swelling.  Gastrointestinal: Positive for abdominal pain. Negative for blood in stool.  Genitourinary: Negative for dysuria and hematuria.  Musculoskeletal: Positive for back pain.  Skin: Negative for itching and rash.    Neurological: Negative for focal weakness and loss of consciousness.  Psychiatric/Behavioral: Negative for substance abuse. The patient is not nervous/anxious.       Hospital Course:    Mary Dougherty a 48 y.o.femalewith medical history significant ofanemia with hereditary spherocytosis, diabetes mellitus type 2, breast cancer(s/plumpectomy, chemotherapy, and radiation), who presents with complaints of epigastric abdominal pain. Symptoms initially started around 4 PM 2 days ago after she had eaten at 2 PM and then had a milkshake. Describes the pain as sharp with radiation to her back. She initially thought symptoms were related to gas or possibly a heart attack as heart disease runs in her family. She took Tums and a baby aspirin without significant change in symptoms. She came to the emergency department for further evaluation and  had a EKG done, but due to the wait time left before being fully evaluated. She followed up with her primary care provider yesterday where lab work was obtained as she was found to have yellowing of her eyes. She continued to have epigastric abdominal pain and reported that her stools were very light in color. Patient denies having any significant fever, shortness of breath, nausea, vomiting, diarrhea, or dysuria.  11/11: Patient admitted for cholelithiasis and pancreatitis with improvement in symptoms this am. MRCP with no definitive obstruction noted and GI has consulted [facility] for lap chole and IOC. CLD today. Continue IVF and trend labs.  11/12: Patient eager to have surgery done today. She is overall feeling much better. LFTs are downtrending. General surgery plans for lap chole with IOC in a.m.  11/13: Underwent lap chole with IOC.  By the late morning of 12/17/2018 patient was doing well without significant pain and felt  Ok for discharge on tyelnol, refusing other pain medication(s)      Discharge Condition: Stable, improved  Follow  UP  Follow-up [facility]. Go on 01/05/2019.   Specialty: General Surgery Why: Your appointment is 01/05/19 at 2:45 pm Please arrive 30 minutes prior to your appointment to check in and fill out paperwork. Bring photo ID and insurance information. Contact information: [address] [place] [phone]           Consults obtained - Gen Surgery  Diet and Activity recommendation:  As advised  Discharge Instructions    Discharge Instructions    Call MD for:  difficulty breathing, headache or visual disturbances   Complete by: As directed    Call MD for:  extreme fatigue   Complete by: As directed    Call MD for:  hives   Complete by: As directed    Call MD for:  persistant dizziness or light-headedness   Complete by: As directed    Call MD for:  persistant nausea and vomiting   Complete by: As directed    Call MD for:  redness, tenderness, or signs of infection (pain, swelling, redness, odor or green/yellow discharge around incision site)   Complete by: As directed    Call MD for:  severe uncontrolled pain   Complete by: As directed    Call MD for:  temperature >100.4   Complete by: As directed    Diet - low sodium heart healthy   Complete by: As directed    Increase activity slowly   Complete by: As directed         Discharge Medications     Allergies as of 12/17/2018   No Known Allergies     Medication List    TAKE these medications   acetaminophen 325 MG tablet Commonly known as: TYLENOL Take 2 tablets (650 mg total) by mouth every 6 (six) hours as needed for mild pain (or Fever >/= 101).   exemestane 25 MG tablet Commonly known as: AROMASIN Take 1 tablet (25 mg total) by mouth daily with lunch.   metFORMIN 500 MG tablet Commonly known as: GLUCOPHAGE Take 1 tablet (500 mg total) by mouth 2 (two) times daily with a meal.   omeprazole 20 MG capsule Commonly known as:  PRILOSEC Take 1 capsule (20 mg total) by mouth daily.   ondansetron 4 MG/2ML Soln injection Commonly known as: ZOFRAN Inject 2 mLs (4 mg total) into the vein every 6 (six) hours as needed for  nausea or vomiting.       Major procedures and Radiology Reports - PLEASE review detailed and final reports for all details, in brief -   12/16/18:LAPAROSCOPIC CHOLECYSTECTOMY WITH INTRAOPERATIVE CHOLANGIOGRAM (N/A)   Dg Chest 2 View  Result Date: 12/12/2018 CLINICAL DATA:  Syncopal episode, chest pain, shortness of breath EXAM: CHEST - 2 VIEW COMPARISON:  06/11/2017 FINDINGS: There is no focal consolidation. There is no pleural effusion or pneumothorax. The heart and mediastinal contours are unremarkable. There are postsurgical changes with surgical clips in the right breast from prior lumpectomy. There is no acute osseous abnormality. IMPRESSION: No active cardiopulmonary disease. Electronically Signed   By: Kathreen Devoid   On: 12/12/2018 08:40   Dg Cholangiogram Operative  Result Date: 12/16/2018 CLINICAL DATA:  Cholecystectomy for gallstone pancreatitis. EXAM: INTRAOPERATIVE CHOLANGIOGRAM TECHNIQUE: Cholangiographic images from the C-arm fluoroscopic device were submitted for interpretation post-operatively. Please see the procedural report for the amount of contrast and the fluoroscopy time utilized. COMPARISON:  MRI/MRCP on 12/13/2018 FINDINGS: Intraoperative imaging with a C-arm demonstrates normal opacified bile ducts without evidence of filling defect or obstruction. Contrast enters the duodenum normally. Slight reflux of contrast into the pancreatic duct. IMPRESSION: Normal intraoperative cholangiogram. Electronically Signed   By: Aletta Edouard M.D.   On: 12/16/2018 11:04   Ct Abdomen Pelvis W Contrast  Result Date: 12/13/2018 CLINICAL DATA:  Epigastric pain, episode Saturday, resolved, recurrent today at 0300 hours, associated jaundice, lower abdominal pain, leukocytosis, history breast  cancer in 2014 with recurrence in 2019 EXAM: CT ABDOMEN AND PELVIS WITH CONTRAST TECHNIQUE: Multidetector CT imaging of the abdomen and pelvis was performed using the standard protocol following bolus administration of intravenous contrast. Sagittal and coronal MPR images reconstructed from axial data set. CONTRAST:  180m OMNIPAQUE IOHEXOL 300 MG/ML SOLN IV. No oral contrast. COMPARISON:  10/26/2012 FINDINGS: Lower chest: Lung bases clear Hepatobiliary: Minimal dependent density in gallbladder suspect tiny calculi. No definite gallbladder wall thickening. Liver unremarkable. No biliary dilatation or biliary air. Pancreas: Edema identified adjacent to pancreatic tail most consistent with pancreatitis. Edema is also seen extending into the small bowel mesentery inferior to the stomach and posterior to the transverse colon. No focal fluid collection. No pancreatic hemorrhage or necrosis. Spleen: Enlarged, 13.7 x 15.2 x 5.1 cm (volume = 560 cm^3). No focal lesions. Small splenule. Adrenals/Urinary Tract: Tiny cyst at upper pole LEFT kidney. Adrenal glands, kidneys, ureters, and bladder otherwise normal appearance. Stomach/Bowel: Minimal diverticulosis of descending and sigmoid colon. Normal appendix. Stomach and bowel loops otherwise normal appearance. Vascular/Lymphatic: Minimal atherosclerotic calcification aorta. Aorta normal caliber. No adenopathy. Portal, splenic, and superior mesenteric veins patent. Reproductive: Uterus surgically absent with nonvisualization of ovaries Other: No free air or free fluid. Tiny umbilical hernia containing fat. Musculoskeletal: No acute osseous findings. IMPRESSION: Edema adjacent to the pancreatic tail consistent with pancreatitis; recommend correlation with serum amylase/lipase. No evidence of pancreatic hemorrhage or necrosis. Mild splenomegaly. Suspectedcholelithiasis. Minimal distal colonic diverticulosis. Aortic Atherosclerosis (ICD10-I70.0). Electronically Signed   By: MLavonia DanaM.D.   On: 12/13/2018 08:29   Mr 3d Recon At Scanner  Result Date: 12/13/2018 CLINICAL DATA:  48year old female with history of pancreatitis. Epigastric pain. Evaluate for potential choledocholithiasis. EXAM: MRI ABDOMEN WITHOUT AND WITH CONTRAST (INCLUDING MRCP) TECHNIQUE: Multiplanar multisequence MR imaging of the abdomen was performed both before and after the administration of intravenous contrast. Heavily T2-weighted images of the biliary and pancreatic ducts were obtained, and three-dimensional MRCP images were rendered by post processing. CONTRAST:  41m GADAVIST GADOBUTROL 1 MMOL/ML IV SOLN COMPARISON:  No prior abdominal MRI. CT the abdomen and pelvis 12/05/2018. FINDINGS: Lower chest: Cardiomegaly. Postoperative changes of right-sided modified radical mastectomy. Hepatobiliary: Diffuse loss of signal intensity throughout the hepatic parenchyma on out of phase dual echo images, indicative of hepatic steatosis. No suspicious cystic or solid hepatic lesions. No intra or extrahepatic biliary ductal dilatation noted on MRCP images. Common bile duct measures 6 mm in the porta hepatis. No well-defined filling defects in the common bile duct to suggest choledocholithiasis. However, there is amorphous T2 hypointensity the lying dependently in the common bile duct which corresponds to T1 hyperintensity lying dependently in the common bile duct, compatible with biliary sludge in the duct. There is some ill-defined low T2 signal and high T1 intensity material lying dependently in the gallbladder which may reflect a small amount of biliary sludge and/or small gallstones. Gallbladder does not appear distended. No gallbladder wall thickening or significant volume of pericholecystic fluid. Pancreas: Extensive inflammatory changes are noted within and around the pancreas, compatible with an acute pancreatitis. No discrete pancreatic mass. Although there is extensive peripancreatic fluid, no well-defined fluid  collection is confidently identified to clearly indicate the presence of a pancreatic pseudocyst. The pancreatic parenchyma appears to enhance normally. No pancreatic ductal dilatation noted on MRCP images. Spleen: The spleen is enlarged measuring 15.1 x 6.4 x 15.0 cm (estimated splenic volume of 725 mL). In addition, the spleen demonstrates diffuse low signal intensity on T2 weighted images, and loss of signal intensity on in phase dual echo images, indicative of significant iron deposition. Adrenals/Urinary Tract: In the upper pole of the left kidney there is an exophytic T1 hypointense, T2 hyperintense, nonenhancing lesion, compatible with a simple cyst. Right kidney and bilateral adrenal glands are normal in appearance. Stomach/Bowel: T2 hyperintense fluid adjacent to portions of the distal stomach and duodenum, presumably related to the adjacent inflammation in the pancreas. Otherwise, visualized portions are unremarkable. Vascular/Lymphatic: No aneurysm identified in the visualized abdominal vasculature. Splenic vein, superior mesenteric vein and portal vein all remain patent at this time. No lymphadenopathy noted in the visualized abdomen. Other: Inflammatory fluid throughout the retroperitoneum most evident adjacent to the pancreas. No significant volume of ascites in the visualized portions of the peritoneal cavity. Musculoskeletal: No aggressive appearing osseous lesions are noted in the visualized portions of the skeleton. IMPRESSION: 1. Acute pancreatitis, without evidence of pancreatic necrosis, pancreatic pseudocyst or other acute complicating features. 2. Biliary sludge and/or small gallstones lying dependently in the gallbladder. No findings to suggest an acute cholecystitis at this time. 3. Although there is no choledocholithiasis, there is clearly biliary sludge in the common bile duct lying dependently. Despite this, there is no evidence of biliary tract obstruction at this time. Common bile duct  measures 6 mm in the porta hepatis. 4. Hepatic steatosis. 5. Iron deposition in the spleen. 6. Cardiomegaly. Electronically Signed   By: DVinnie LangtonM.D.   On: 12/13/2018 17:16   Mr Abdomen Mrcp WMoise BoringContast  Result Date: 12/13/2018 CLINICAL DATA:  48year old female with history of pancreatitis. Epigastric pain. Evaluate for potential choledocholithiasis. EXAM: MRI ABDOMEN WITHOUT AND WITH CONTRAST (INCLUDING MRCP) TECHNIQUE: Multiplanar multisequence MR imaging of the abdomen was performed both before and after the administration of intravenous contrast. Heavily T2-weighted images of the biliary and pancreatic ducts were obtained, and three-dimensional MRCP images were rendered by post processing. CONTRAST:  725mGADAVIST GADOBUTROL 1 MMOL/ML IV SOLN COMPARISON:  No prior abdominal MRI. CT  the abdomen and pelvis 12/05/2018. FINDINGS: Lower chest: Cardiomegaly. Postoperative changes of right-sided modified radical mastectomy. Hepatobiliary: Diffuse loss of signal intensity throughout the hepatic parenchyma on out of phase dual echo images, indicative of hepatic steatosis. No suspicious cystic or solid hepatic lesions. No intra or extrahepatic biliary ductal dilatation noted on MRCP images. Common bile duct measures 6 mm in the porta hepatis. No well-defined filling defects in the common bile duct to suggest choledocholithiasis. However, there is amorphous T2 hypointensity the lying dependently in the common bile duct which corresponds to T1 hyperintensity lying dependently in the common bile duct, compatible with biliary sludge in the duct. There is some ill-defined low T2 signal and high T1 intensity material lying dependently in the gallbladder which may reflect a small amount of biliary sludge and/or small gallstones. Gallbladder does not appear distended. No gallbladder wall thickening or significant volume of pericholecystic fluid. Pancreas: Extensive inflammatory changes are noted within and around  the pancreas, compatible with an acute pancreatitis. No discrete pancreatic mass. Although there is extensive peripancreatic fluid, no well-defined fluid collection is confidently identified to clearly indicate the presence of a pancreatic pseudocyst. The pancreatic parenchyma appears to enhance normally. No pancreatic ductal dilatation noted on MRCP images. Spleen: The spleen is enlarged measuring 15.1 x 6.4 x 15.0 cm (estimated splenic volume of 725 mL). In addition, the spleen demonstrates diffuse low signal intensity on T2 weighted images, and loss of signal intensity on in phase dual echo images, indicative of significant iron deposition. Adrenals/Urinary Tract: In the upper pole of the left kidney there is an exophytic T1 hypointense, T2 hyperintense, nonenhancing lesion, compatible with a simple cyst. Right kidney and bilateral adrenal glands are normal in appearance. Stomach/Bowel: T2 hyperintense fluid adjacent to portions of the distal stomach and duodenum, presumably related to the adjacent inflammation in the pancreas. Otherwise, visualized portions are unremarkable. Vascular/Lymphatic: No aneurysm identified in the visualized abdominal vasculature. Splenic vein, superior mesenteric vein and portal vein all remain patent at this time. No lymphadenopathy noted in the visualized abdomen. Other: Inflammatory fluid throughout the retroperitoneum most evident adjacent to the pancreas. No significant volume of ascites in the visualized portions of the peritoneal cavity. Musculoskeletal: No aggressive appearing osseous lesions are noted in the visualized portions of the skeleton. IMPRESSION: 1. Acute pancreatitis, without evidence of pancreatic necrosis, pancreatic pseudocyst or other acute complicating features. 2. Biliary sludge and/or small gallstones lying dependently in the gallbladder. No findings to suggest an acute cholecystitis at this time. 3. Although there is no choledocholithiasis, there is clearly  biliary sludge in the common bile duct lying dependently. Despite this, there is no evidence of biliary tract obstruction at this time. Common bile duct measures 6 mm in the porta hepatis. 4. Hepatic steatosis. 5. Iron deposition in the spleen. 6. Cardiomegaly. Electronically Signed   By: Vinnie Langton M.D.   On: 12/13/2018 17:16   US Abdomen Limited Ruq  Result Date: 12/13/2018 CLINICAL DATA:  Onset right upper quadrant pain at 3 a.m. last night. EXAM: ULTRASOUND ABDOMEN LIMITED RIGHT UPPER QUADRANT COMPARISON:  None. FINDINGS: Gallbladder: There is sludge within the gallbladder. There may be a few small stones present. Multiple punctate echogenic foci in the non dependent gallbladder are also seen. No wall thickening or pericholecystic fluid. Common bile duct: Diameter: 0.6 cm Liver: Echogenicity is increased in echotexture is heterogeneous. No focal lesion. Portal vein is patent on color Doppler imaging with normal direction of blood flow towards the liver. Other: None. IMPRESSION: Sludge  and possibly small stones within the gallbladder. Nondependent echogenic foci in the gallbladder worrisome for the presence of intraluminal gas. CT scan with contrast is recommended for further evaluation. Electronically Signed   By: Inge Rise M.D.   On: 12/13/2018 06:57    Micro Results     Recent Results (from the past 240 hour(s))  Blood culture (routine x 2)     Status: None (Preliminary result)   Collection Time: 12/13/18  8:30 AM   Specimen: BLOOD LEFT HAND  Result Value Ref Range Status   Specimen Description BLOOD LEFT HAND  Final   Special Requests   Final    BOTTLES DRAWN AEROBIC AND ANAEROBIC Blood Culture results may not be optimal due to an inadequate volume of blood received in culture bottles   Culture   Final    NO GROWTH 3 DAYS Performed at [facility], [place] [address], [place], [place] [address]    Report Status PENDING  Incomplete  SARS CORONAVIRUS 2 (TAT 6-24 HRS)  Nasopharyngeal Nasopharyngeal Swab     Status: None   Collection Time: 12/13/18  8:30 AM   Specimen: Nasopharyngeal Swab  Result Value Ref Range Status   SARS Coronavirus 2 NEGATIVE NEGATIVE Final    Comment: (NOTE) SARS-CoV-2 target nucleic acids are NOT DETECTED. The SARS-CoV-2 RNA is generally detectable in upper and lower respiratory specimens during the acute phase of infection. Negative results do not preclude SARS-CoV-2 infection, do not rule out co-infections with other pathogens, and should not be used as the sole basis for treatment or other patient management decisions. Negative results must be combined with clinical observations, patient history, and epidemiological information. The expected result is Negative. Fact Sheet for Patients: SugarRoll.be Fact Sheet for Healthcare Providers: https://www.woods-mathews.com/ This test is not yet approved or cleared by the [place] FDA and  has been authorized for detection and/or diagnosis of SARS-CoV-2 by FDA under an Emergency Use Authorization (EUA). This EUA will remain  in effect (meaning this test can be used) for the duration of the COVID-19 declaration under Section 56 4(b)(1) of the Act, 21 U.S.C. section 360bbb-3(b)(1), unless the authorization is terminated or revoked sooner. Performed at [facility], [place] [address], [place], [place] [address]   Blood culture (routine x 2)     Status: None (Preliminary result)   Collection Time: 12/13/18  8:53 AM   Specimen: BLOOD LEFT FOREARM  Result Value Ref Range Status   Specimen Description BLOOD LEFT FOREARM  Final   Special Requests   Final    BOTTLES DRAWN AEROBIC AND ANAEROBIC Blood Culture adequate volume   Culture   Final    NO GROWTH 3 DAYS Performed at [facility], [place] [address], [place], [place] [address]    Report Status PENDING  Incomplete  Surgical pcr screen     Status: None   Collection Time:  12/15/18  6:44 AM   Specimen: Nasal Mucosa; Nasal Swab  Result Value Ref Range Status   MRSA, PCR NEGATIVE NEGATIVE Final   Staphylococcus aureus NEGATIVE NEGATIVE Final    Comment: (NOTE) The Xpert SA Assay (FDA approved for NASAL specimens in patients 38 years of age and older), is one component of a comprehensive surveillance program. It is not intended to diagnose infection nor to guide or monitor treatment. Performed at [facility], [place] [address] [place], [place] [address]        Today   Subjective  Mary Dougherty today has no fever or chills, no abd pains, no nausea. Clearaed for d/c by surgery.       Patient has been seen and examined prior to discharge   Objective   Blood pressure 132/70, pulse 62, temperature 98 F (36.7 C), temperature source Oral, resp. rate 18, height '5\' 5"'$  (1.651 m), weight 82.6 kg, last menstrual period 10/28/2012, SpO2 97 %.   Intake/Output Summary (Last 24 hours) at 12/17/2018 1359 Last data filed at 12/17/2018 1003 Gross per 24 hour  Intake 3752.6 ml  Output --  Net 3752.6 ml    Exam Gen:- Awake  HEENT:- [place].AT, MMM Neck-Supple Neck,No JVD,  Lungs- mostly clear  CV- S1, S2 normal Abd-  +ve B.Sounds, Abd Soft, No tenderness,    Extremity/Skin:- Intact peripheral pulses    Data Review   CBC w Diff:  Lab Results  Component Value Date   WBC 12.9 (H) 12/17/2018   HGB 10.7 (L) 12/17/2018   HGB 10.9 (L) 04/25/2018   HGB 11.0 (L) 03/08/2015   HCT 31.3 (L) 12/17/2018   HCT 31.6 (L) 03/08/2015   PLT 174 12/17/2018   PLT 142 (L) 04/25/2018   PLT 220 03/08/2015   LYMPHOPCT 22 04/25/2018   LYMPHOPCT 19.4 03/08/2015   MONOPCT 4 04/25/2018   MONOPCT 5.3 03/08/2015   EOSPCT 2 04/25/2018   EOSPCT 2.0 03/08/2015   BASOPCT 0 04/25/2018   BASOPCT 0.4 03/08/2015    CMP:  Lab Results  Component Value Date   NA 138 12/17/2018   NA 139 12/12/2018   NA 141 03/08/2015   K 3.9 12/17/2018   K 4.2 03/08/2015   CL 102  12/17/2018   CO2 25 12/17/2018   CO2 26 03/08/2015   BUN 7 12/17/2018   BUN 9 12/12/2018   BUN 12.6 03/08/2015   CREATININE 0.64 12/17/2018   CREATININE 0.76 04/25/2018   CREATININE 0.8 03/08/2015   GLU 124 (H) 03/10/2013   PROT 6.5 12/17/2018   PROT 7.3 12/12/2018   PROT 7.8 03/08/2015   ALBUMIN 2.9 (L) 12/17/2018   ALBUMIN 4.6 12/12/2018   ALBUMIN 4.0 03/08/2015   BILITOT 4.3 (H) 12/17/2018   BILITOT 9.1 (H) 12/12/2018   BILITOT 2.2 (H) 04/25/2018   BILITOT 1.45 (H) 03/08/2015   ALKPHOS 108 12/17/2018   ALKPHOS 112 03/08/2015   AST 59 (H) 12/17/2018   AST 29 04/25/2018   AST 14 03/08/2015   ALT 111 (H) 12/17/2018   ALT 70 (H) 04/25/2018   ALT 19 03/08/2015  .   Total Discharge time is about 33 minutes  Benito Mccreedy M.D on 12/17/2018 at 1:59 PM  [facility]   Office  [phone]  Dragon dictation system was used to create this note, attempts have been made to correct errors, however presence of uncorrected errors is not a reflection quality of care provided

## 2018-12-17 NOTE — Progress Notes (Signed)
Discharge instructions reviewed with patient. All questions answered at this time. Transportation provide by family.  Ave Filter, RN

## 2018-12-17 NOTE — Progress Notes (Signed)
1 Day Post-Op   Subjective/Chief Complaint: Comfortable this morning post op Tolerating po Pain controlled   Objective: Vital signs in last 24 hours: Temp:  [97.4 F (36.3 C)-98.3 F (36.8 C)] 97.4 F (36.3 C) (11/14 0551) Pulse Rate:  [57-80] 57 (11/14 0551) Resp:  [16-22] 16 (11/14 0049) BP: (106-128)/(44-65) 117/64 (11/14 0551) SpO2:  [92 %-100 %] 95 % (11/14 0551) Last BM Date: 12/14/18  Intake/Output from previous day: 11/13 0701 - 11/14 0700 In: 3282.6 [I.V.:2925.2; IV Piggyback:357.4] Out: 10 [Blood:10] Intake/Output this shift: Total I/O In: 24 [IV Piggyback:50] Out: -   Exam: Awake and alert Up in a chair Abdomen soft, incisions clean  Lab Results:  Recent Labs    12/16/18 0505 12/17/18 0359  WBC 7.1 12.9*  HGB 10.2* 10.7*  HCT 29.5* 31.3*  PLT 128* 174   BMET Recent Labs    12/16/18 0505 12/17/18 0359  NA 139 138  K 3.5 3.9  CL 104 102  CO2 23 25  GLUCOSE 103* 162*  BUN <5* 7  CREATININE 0.62 0.64  CALCIUM 8.8* 8.7*   PT/INR No results for input(s): LABPROT, INR in the last 72 hours. ABG No results for input(s): PHART, HCO3 in the last 72 hours.  Invalid input(s): PCO2, PO2  Studies/Results: Dg Cholangiogram Operative  Result Date: 12/16/2018 CLINICAL DATA:  Cholecystectomy for gallstone pancreatitis. EXAM: INTRAOPERATIVE CHOLANGIOGRAM TECHNIQUE: Cholangiographic images from the C-arm fluoroscopic device were submitted for interpretation post-operatively. Please see the procedural report for the amount of contrast and the fluoroscopy time utilized. COMPARISON:  MRI/MRCP on 12/13/2018 FINDINGS: Intraoperative imaging with a C-arm demonstrates normal opacified bile ducts without evidence of filling defect or obstruction. Contrast enters the duodenum normally. Slight reflux of contrast into the pancreatic duct. IMPRESSION: Normal intraoperative cholangiogram. Electronically Signed   By: Aletta Edouard M.D.   On: 12/16/2018 11:04     Anti-infectives: Anti-infectives (From admission, onward)   Start     Dose/Rate Route Frequency Ordered Stop   12/13/18 1700  piperacillin-tazobactam (ZOSYN) IVPB 3.375 g     3.375 g 12.5 mL/hr over 240 Minutes Intravenous Every 8 hours 12/13/18 0945     12/13/18 1400  piperacillin-tazobactam (ZOSYN) IVPB 3.375 g  Status:  Discontinued     3.375 g 100 mL/hr over 30 Minutes Intravenous Every 8 hours 12/13/18 0940 12/13/18 0945   12/13/18 0800  piperacillin-tazobactam (ZOSYN) IVPB 3.375 g     3.375 g 100 mL/hr over 30 Minutes Intravenous  Once 12/13/18 0745 12/13/18 0916      Assessment/Plan: s/p Procedure(s): LAPAROSCOPIC CHOLECYSTECTOMY WITH INTRAOPERATIVE CHOLANGIOGRAM (N/A)  Tbili improving.  c-gram without obstruction  Ok from a general surg standpoint for d/c today  LOS: 4 days    Coralie Keens MD 12/17/2018

## 2018-12-18 LAB — CULTURE, BLOOD (ROUTINE X 2)
Culture: NO GROWTH
Culture: NO GROWTH
Special Requests: ADEQUATE

## 2018-12-19 LAB — SURGICAL PATHOLOGY

## 2018-12-20 ENCOUNTER — Encounter (HOSPITAL_COMMUNITY): Payer: Self-pay | Admitting: Surgery

## 2018-12-26 ENCOUNTER — Other Ambulatory Visit: Payer: Self-pay | Admitting: Family Medicine

## 2018-12-26 DIAGNOSIS — E119 Type 2 diabetes mellitus without complications: Secondary | ICD-10-CM

## 2019-01-06 ENCOUNTER — Telehealth: Payer: Self-pay | Admitting: Family Medicine

## 2019-01-06 NOTE — Telephone Encounter (Signed)
lvmtcb - pt needs ov with [facility]ings for repeat labs due to pt having gallbladder removed. [facility] gave OK to use hosp f/u slot or whenever is better for the pt

## 2019-04-26 ENCOUNTER — Other Ambulatory Visit: Payer: 59

## 2019-04-26 ENCOUNTER — Ambulatory Visit: Payer: 59 | Admitting: Hematology and Oncology

## 2019-05-02 ENCOUNTER — Other Ambulatory Visit: Payer: Self-pay | Admitting: Family Medicine

## 2019-05-02 DIAGNOSIS — E119 Type 2 diabetes mellitus without complications: Secondary | ICD-10-CM

## 2019-05-02 NOTE — Telephone Encounter (Signed)
Requested Prescriptions  Pending Prescriptions Disp Refills  . metFORMIN (GLUCOPHAGE) 500 MG tablet [Pharmacy Med Name: metFORMIN HCl 500 MG Oral Tablet] 180 tablet 0    Sig: TAKE 1 TABLET BY MOUTH TWICE DAILY WITH A MEAL     Endocrinology:  Diabetes - Biguanides Passed - 05/02/2019  1:38 PM      Passed - Cr in normal range and within 360 days    Creatinine  Date Value Ref Range Status  04/25/2018 0.76 0.44 - 1.00 mg/dL Final  03/08/2015 0.8 0.6 - 1.1 mg/dL Final   Creatinine, Ser  Date Value Ref Range Status  12/17/2018 0.64 0.44 - 1.00 mg/dL Final         Passed - HBA1C is between 0 and 7.9 and within 180 days    Hgb A1c MFr Bld  Date Value Ref Range Status  12/14/2018 5.1 4.8 - 5.6 % Final    Comment:    (NOTE) Pre diabetes:          5.7%-6.4% Diabetes:              >6.4% Glycemic control for   <7.0% adults with diabetes          Passed - eGFR in normal range and within 360 days    GFR, Est African American  Date Value Ref Range Status  01/25/2015 >89 >=60 mL/min Final   GFR, Est AFR Am  Date Value Ref Range Status  04/25/2018 >60 >60 mL/min Final   GFR calc Af Amer  Date Value Ref Range Status  12/17/2018 >60 >60 mL/min Final   GFR, Est Non African American  Date Value Ref Range Status  01/25/2015 >89 >=60 mL/min Final    Comment:      The estimated GFR is a calculation valid for adults (>=75 years old) that uses the CKD-EPI algorithm to adjust for age and sex. It is   not to be used for children, pregnant women, hospitalized patients,    patients on dialysis, or with rapidly changing kidney function. According to the NKDEP, eGFR >89 is normal, 60-89 shows mild impairment, 30-59 shows moderate impairment, 15-29 shows severe impairment and <15 is ESRD.      GFR, Est Non Af Am  Date Value Ref Range Status  04/25/2018 >60 >60 mL/min Final   GFR calc non Af Amer  Date Value Ref Range Status  12/17/2018 >60 >60 mL/min Final   EGFR  Date Value Ref  Range Status  03/08/2015 >90 >90 ml/min/1.73 m2 Final    Comment:    eGFR is calculated using the CKD-EPI Creatinine Equation (2009)         Passed - Valid encounter within last 6 months    Recent Outpatient Visits          4 months ago Chest pain, radiating   Primary Care at [facility], Zoe A, MD   1 year ago Type 2 diabetes mellitus without complication, without long-term current use of insulin [facility])   Primary Care at [facility], Ranell Patrick, MD   1 year ago Type 2 diabetes mellitus with complication, without long-term current use of insulin [facility])   Primary Care at [facility], Arlie Solomons, MD   1 year ago New onset type 2 diabetes mellitus [facility])   Primary Care at [place], [place]   1 year ago Sore throat   Primary Care at [place], [place] D, [place]

## 2019-06-21 ENCOUNTER — Other Ambulatory Visit: Payer: Self-pay | Admitting: Oncology

## 2019-06-22 ENCOUNTER — Telehealth: Payer: Self-pay | Admitting: Hematology and Oncology

## 2019-06-22 NOTE — Telephone Encounter (Signed)
Scheduled appt per 5/20 sch message -unable to reach pt - left message with appt date and time   

## 2019-08-21 NOTE — Progress Notes (Signed)
Patient Care Team: Forrest Moron, MD as PCP - General (Internal Medicine) Nicholas Lose, MD as Consulting Physician (Hematology and Oncology)  DIAGNOSIS:    ICD-10-CM   1. Malignant neoplasm of lower-outer quadrant of right breast of female, estrogen receptor positive ([place])  C50.511    Z17.0     SUMMARY OF ONCOLOGIC HISTORY: Oncology History  Breast cancer of lower-outer quadrant of right female breast ([place])  09/08/2012 Initial Diagnosis   Breast cancer of lower-outer quadrant of right female breast: ER 99% PR 100% HER-2 negative, Ki-67 70%   10/07/2012 - 03/17/2013 Neo-Adjuvant Chemotherapy   Dose dense FEC followed by weekly Taxol   04/06/2013 Surgery   Right breast lumpectomy residual 1.1 cm IDC grade 3 with high-grade DCIS, PNI +ve, 1/2 SLN positive, no MVI, T1 C. N1 A. stage IIA, ER 97% PR 95% HER-2 1.1 ratio negative   05/08/2013 - 06/22/2013 Radiation Therapy   Radiation therapy to lumpectomy site   07/03/2013 - 04/27/2017 Anti-estrogen oral therapy   Zoladex plus Aromasin    Procedure   Genetic testing:PTEN C.-([id number])del 26 on [employer] testing   04/15/2017 Relapse/Recurrence   Right breast biopsy: Invasive ductal carcinoma, grade 3, ER 10%, PR 0%, Ki-67 30%, HER-2 equivocal, ratio 1.36, copy #5.2, IHC 1+ negative   04/22/2017 Breast MRI   Malignancy LOQ right breast 2.3 cm within the lumpectomy scar, left breast normal   06/02/2017 - 09/03/2017 Neo-Adjuvant Chemotherapy   Carboplatin and gemcitabine neoadjuvant chemotherapy   06/11/2017 - 06/12/2017 Hospital Admission   Syncope due to anemia required blood transfusion   09/23/2017 Breast MRI   Persistent enhancement right breast malignancy at 6:00 2.4 x 1.7 x 0.8 cm enhancement in the right nipple could be physiologic    10/05/2017 Surgery   Right mastectomy: IDC grade 3, 3 cm, with high-grade DCIS, margins negative, 0/2 lymph nodes negative, ER 10%, PR 0%, HER-2 negative, Ki-67 30% ypT2ypN0    10/25/2017 -   Anti-estrogen oral therapy   Aromasin '25mg'$  daily, plan for 5 years   Malignant neoplasm of lower-outer quadrant of right breast of female, estrogen receptor positive ([place])    CHIEF COMPLIANT: Surveillance of breast cancer on exemestane therapy  INTERVAL HISTORY: Mary Dougherty is a 49 y.o. with above-mentioned history of triple negative recurrent breast cancer who underwent a right mastectomy and is currently on antiestrogen therapy with exemestane. Mammogram on 11/06/18 showed no evidence of malignancy in the left breast. She presents to the clinic today for follow-up.  ALLERGIES:  has No Known Allergies.  MEDICATIONS:  Current Outpatient Medications  Medication Sig Dispense Refill  . acetaminophen (TYLENOL) 325 MG tablet Take 2 tablets (650 mg total) by mouth every 6 (six) hours as needed for mild pain (or Fever >/= 101). 60 tablet 0  . exemestane (AROMASIN) 25 MG tablet TAKE 1 TABLET BY MOUTH ONCE DAILY WITH  LUNCH 30 tablet 0  . metFORMIN (GLUCOPHAGE) 500 MG tablet TAKE 1 TABLET BY MOUTH TWICE DAILY WITH A MEAL 180 tablet 0  . omeprazole (PRILOSEC) 20 MG capsule Take 1 capsule (20 mg total) by mouth daily. 30 capsule 3  . ondansetron (ZOFRAN) 4 MG/2ML SOLN injection Inject 2 mLs (4 mg total) into the vein every 6 (six) hours as needed for nausea or vomiting. 2 mL 0   No current facility-administered medications for this visit.    PHYSICAL EXAMINATION: ECOG PERFORMANCE STATUS: 1 - Symptomatic but completely ambulatory  There were no vitals filed for this visit. There  were no vitals filed for this visit.  BREAST: No palpable masses or nodules in either right or left breasts. No palpable axillary supraclavicular or infraclavicular adenopathy no breast tenderness or nipple discharge. (exam performed in the presence of a chaperone)  LABORATORY DATA:  I have reviewed the data as listed CMP Latest Ref Rng & Units 12/17/2018 12/16/2018 12/15/2018  Glucose 70 - 99 mg/dL 162(H) 103(H)  128(H)  BUN 6 - 20 mg/dL 7 <5(L) <5(L)  Creatinine 0.44 - 1.00 mg/dL 0.64 0.62 0.64  Sodium 135 - 145 mmol/L 138 139 139  Potassium 3.5 - 5.1 mmol/L 3.9 3.5 3.4(L)  Chloride 98 - 111 mmol/L 102 104 106  CO2 22 - 32 mmol/L '25 23 25  '$ Calcium 8.9 - 10.3 mg/dL 8.7(L) 8.8(L) 8.9  Total Protein 6.5 - 8.1 g/dL 6.5 6.4(L) 6.4(L)  Total Bilirubin 0.3 - 1.2 mg/dL 4.3(H) 6.3(H) 9.6(H)  Alkaline Phos 38 - 126 U/L 108 118 115  AST 15 - 41 U/L 59(H) 37 45(H)  ALT 0 - 44 U/L 111(H) 99(H) 120(H)    Lab Results  Component Value Date   WBC 12.9 (H) 12/17/2018   HGB 10.7 (L) 12/17/2018   HCT 31.3 (L) 12/17/2018   MCV 94.6 12/17/2018   PLT 174 12/17/2018   NEUTROABS 7.5 04/25/2018    ASSESSMENT & PLAN:  Breast cancer of lower-outer quadrant of right female breast 2014:Right breast invasive ductal carcinoma ER/PR positive HER-2 negative stage II A. T3, N1, M0 with PTEN mutation Priortreatment:Neoadjuvant chemo with FEC Taxol right lumpectomy, radiation, adjuvant Zoladex and exemestane started 07/03/2013 ------------------------------------------------------------------------------ Breast MRI 04/22/2017: 2.3 cm malignancy lower outer quadrant right breast within the lumpectomy scar  Recommendation: 1.Neoadjuvant chemotherapy with carboplatin and gemcitabine day 1 and 8 every 3 weeks for 6 cycles 2.followed by mastectomy 3.Followed by continuedantiestrogen therapy  PET/CT scan: Negative for distant metastasis -------------------------------------------------------------------------------- 10/05/2017:Right mastectomy: IDC grade 3, 3 cm, with high-grade DCIS, margins negative, 0/2 lymph nodes negative, ER 10%, PR 0%, HER-2 negative, Ki-67 30% ypT2ypN0 11/08/2017: Hysterectomy and bilateral salpingo-oophorectomy: Benign  Current treatment: Exemestane 25 mg daily restarted 10/25/2017 x 5 years  Exemestane toxicities: Tolerating it extremely well. Cholecystectomy  Breast cancer  surveillance: Left breast mammogram: 11/29/2018: Benign no evidence of malignancy breast density category A Breast exam: 08/22/2019: Benign  Patient has a small farm and raises goats, chickens, dogs and pigs Return to clinic in 1 year for follow-up    No orders of the defined types were placed in this encounter.  The patient has a good understanding of the overall plan. she agrees with it. she will call with any problems that may develop before the next visit here.  Total time spent: 20 mins including face to face time and time spent for planning, charting and coordination of care  Nicholas Lose, MD 08/22/2019  I, Cloyde Reams Dorshimer, am acting as scribe for Dr. Nicholas Lose.  I have reviewed the above documentation for accuracy and completeness, and I agree with the above.

## 2019-08-22 ENCOUNTER — Other Ambulatory Visit: Payer: Self-pay

## 2019-08-22 ENCOUNTER — Inpatient Hospital Stay
Payer: No Typology Code available for payment source | Attending: Hematology and Oncology | Admitting: Hematology and Oncology

## 2019-08-22 DIAGNOSIS — Z170 Estrogen receptor positive status [ER+]: Secondary | ICD-10-CM

## 2019-08-22 DIAGNOSIS — Z79899 Other long term (current) drug therapy: Secondary | ICD-10-CM | POA: Diagnosis not present

## 2019-08-22 DIAGNOSIS — Z7984 Long term (current) use of oral hypoglycemic drugs: Secondary | ICD-10-CM | POA: Insufficient documentation

## 2019-08-22 DIAGNOSIS — Z79811 Long term (current) use of aromatase inhibitors: Secondary | ICD-10-CM | POA: Diagnosis not present

## 2019-08-22 DIAGNOSIS — C50511 Malignant neoplasm of lower-outer quadrant of right female breast: Secondary | ICD-10-CM | POA: Diagnosis not present

## 2019-08-22 DIAGNOSIS — Z9011 Acquired absence of right breast and nipple: Secondary | ICD-10-CM | POA: Insufficient documentation

## 2019-08-22 MED ORDER — EXEMESTANE 25 MG PO TABS: 25.0000 mg | ORAL_TABLET | Freq: Every day | ORAL | 3 refills | Status: DC

## 2019-08-22 NOTE — Assessment & Plan Note (Signed)
2014:Right breast invasive ductal carcinoma ER/PR positive HER-2 negative stage II A. T3, N1, M0 with PTEN mutation Priortreatment:Neoadjuvant chemo with FEC Taxol right lumpectomy, radiation, adjuvant Zoladex and exemestane started 07/03/2013 ------------------------------------------------------------------------------ Breast MRI 04/22/2017: 2.3 cm malignancy lower outer quadrant right breast within the lumpectomy scar  Recommendation: 1.Neoadjuvant chemotherapy with carboplatin and gemcitabine day 1 and 8 every 3 weeks for 6 cycles 2.followed by mastectomy 3.Followed by continuedantiestrogen therapy  PET/CT scan: Negative for distant metastasis -------------------------------------------------------------------------------- 10/05/2017:Right mastectomy: IDC grade 3, 3 cm, with high-grade DCIS, margins negative, 0/2 lymph nodes negative, ER 10%, PR 0%, HER-2 negative, Ki-67 30% ypT2ypN0 11/08/2017: Hysterectomy and bilateral salpingo-oophorectomy: Benign  Current treatment: Exemestane 25 mg daily restarted 10/25/2017 x 5 years  Exemestane toxicities: Tolerating it extremely well.  Breast cancer surveillance: Left breast mammogram: 11/29/2018: Benign no evidence of malignancy breast density category A Breast exam: 08/22/2019: Benign  Return to clinic in 1 year for follow-up

## 2019-08-29 ENCOUNTER — Encounter: Payer: Self-pay | Admitting: Hematology and Oncology

## 2019-09-01 ENCOUNTER — Other Ambulatory Visit: Payer: Self-pay

## 2019-09-01 DIAGNOSIS — E119 Type 2 diabetes mellitus without complications: Secondary | ICD-10-CM

## 2019-09-01 MED ORDER — METFORMIN HCL 500 MG PO TABS: ORAL_TABLET | ORAL | 0 refills | Status: AC

## 2019-09-08 IMAGING — CR DG CHEST 2V
2 series · 2 of 2 positions shown · non-contrast
Comparison: 05/18/2017

CLINICAL DATA: Syncope today, breast cancer, chemotherapy 2 days
ago, diabetes mellitus, history spherocytosis

EXAM:
CHEST - 2 VIEW

[x chest ap]
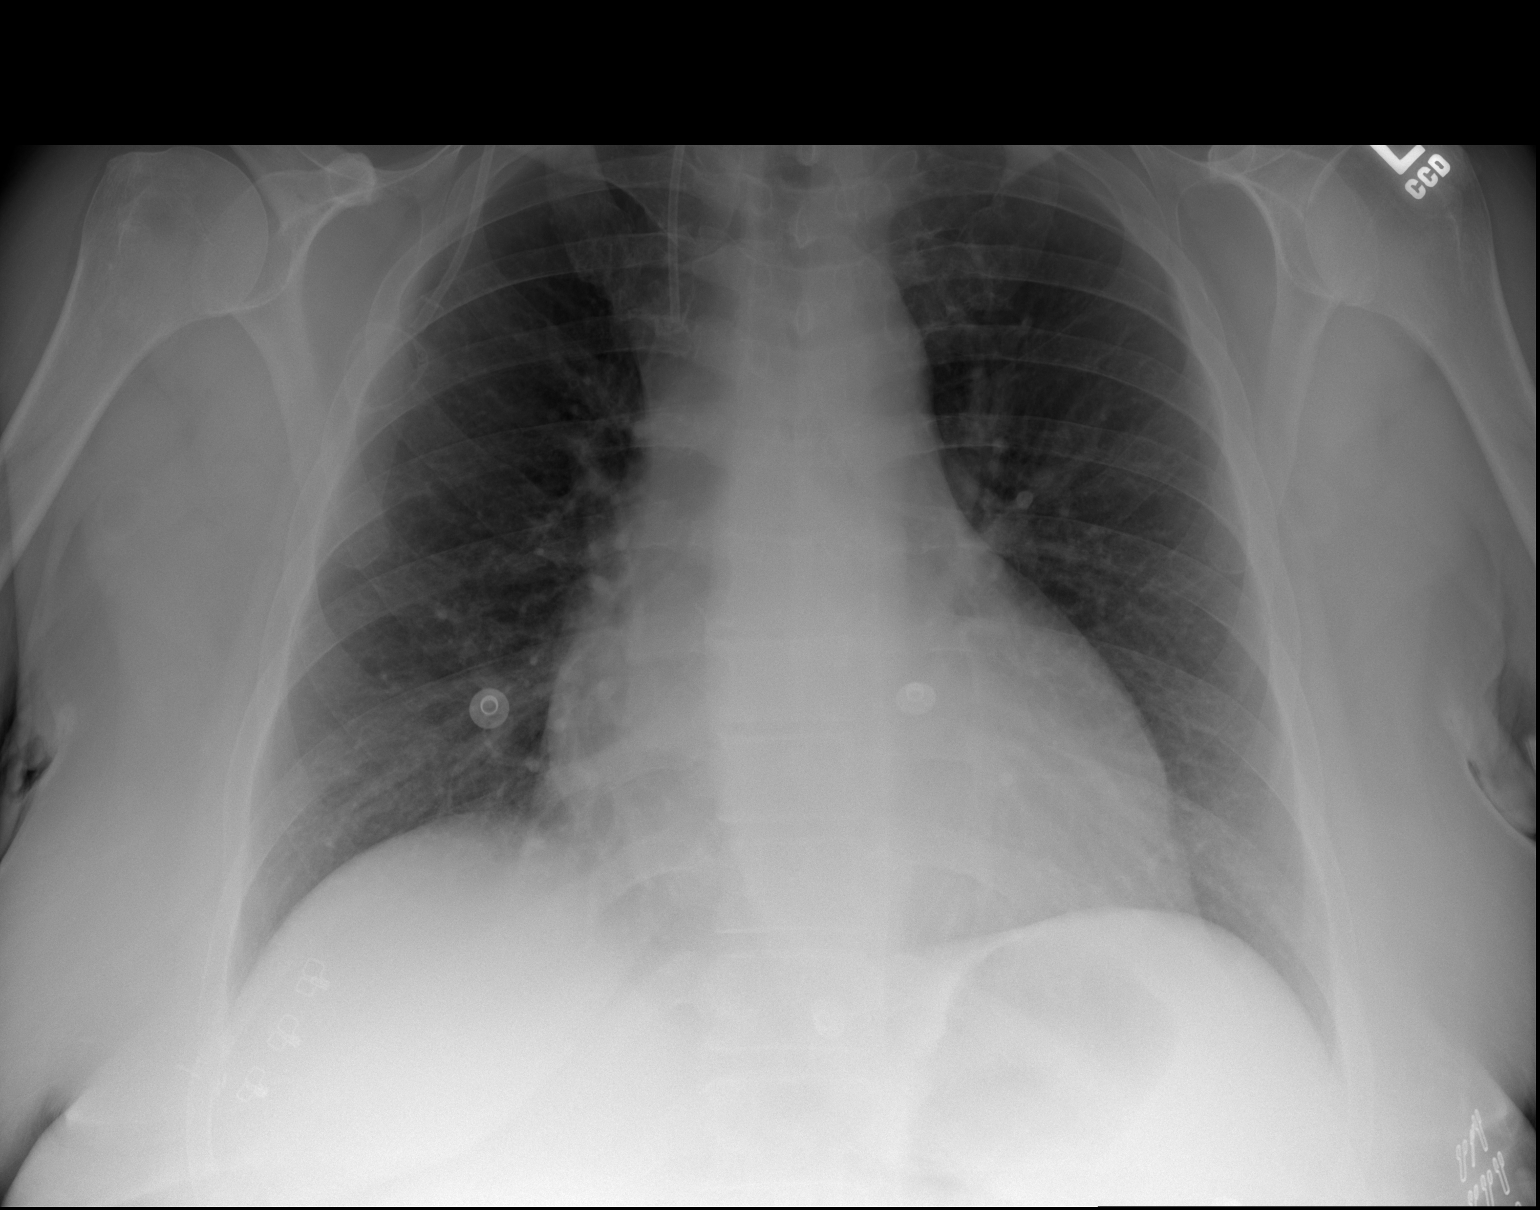

[w chest lat]
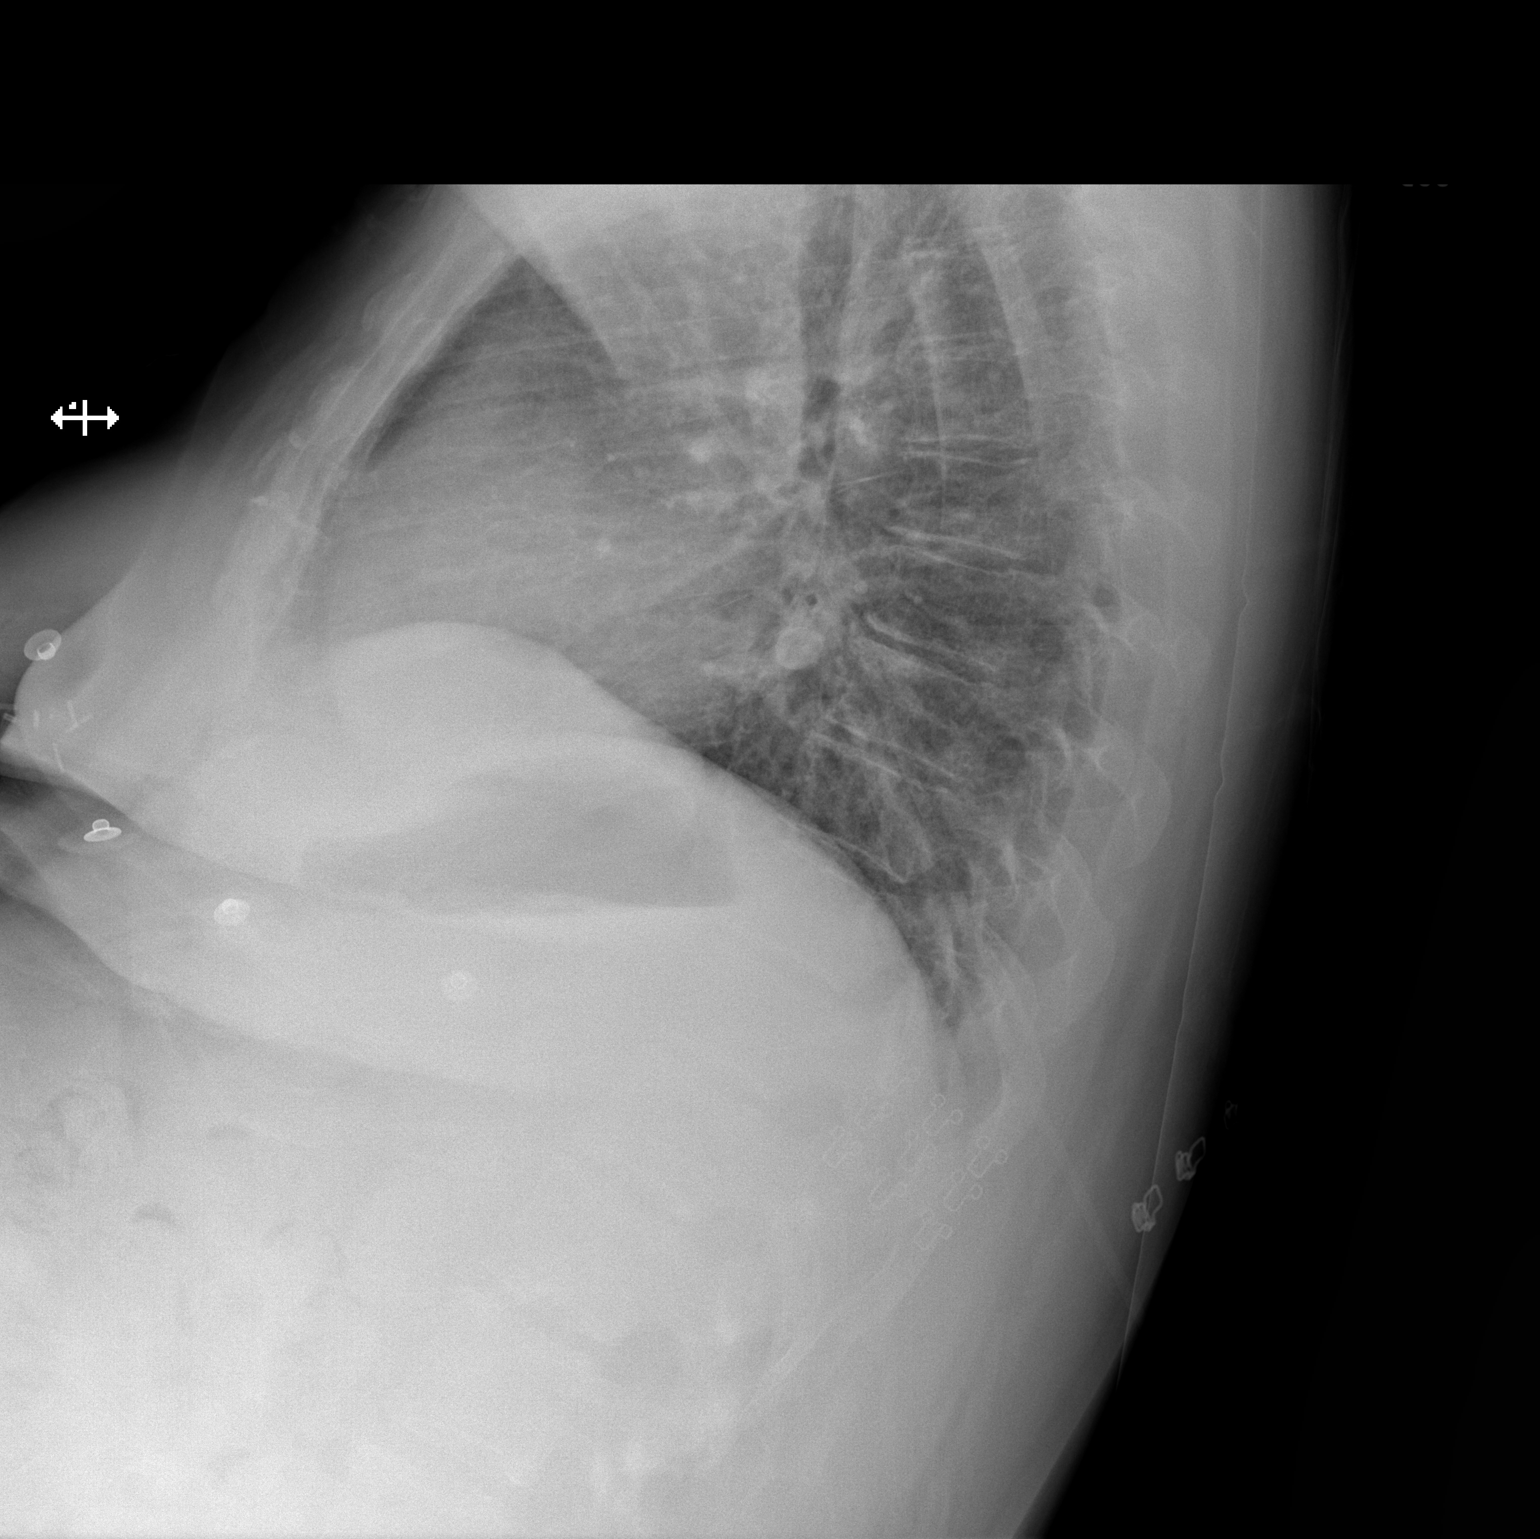

[2 of 2 positions shown; findings below may reference images not displayed]

FINDINGS: RIGHT jugular Port-A-Cath with tip projecting over SVC.

Enlargement of cardiac silhouette.

Mediastinal contours and pulmonary vascularity normal.

Lungs clear.

No pleural effusion or pneumothorax.

Bones unremarkable.
IMPRESSION: Enlargement of cardiac silhouette without acute abnormalities.

## 2019-09-08 IMAGING — CT CT HEAD W/O CM
3 of 4 series · 14 of 47 positions shown, 16 images · non-contrast
Comparison: 05/04/2017 PET CT

CLINICAL DATA: 47-year-old female with altered level of
consciousness and syncope. History of breast cancer, on chemotherapy

EXAM:
CT HEAD WITHOUT CONTRAST
TECHNIQUE: Contiguous axial images were obtained from the base of the skull
through the vertex without intravenous contrast.

[Series 2: head wo · axial · 0.47mm/px · z∈[-182,-72]mm · 8 of 28 slices shown, 10 images]
[im 3/28  brain]
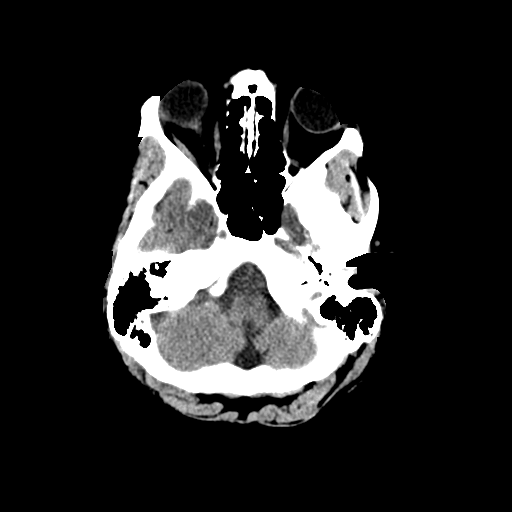
[im 3/28  bone]
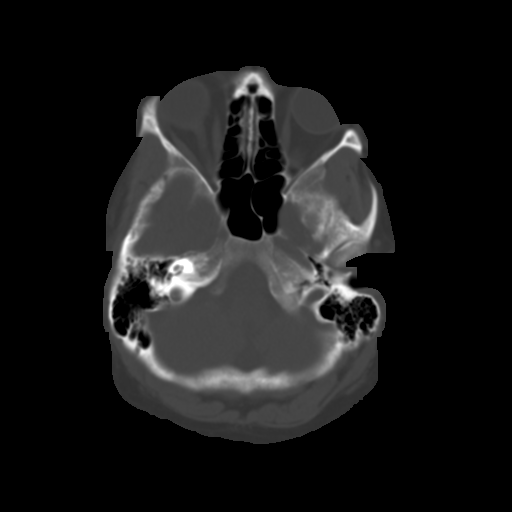
[im 6/28  brain]
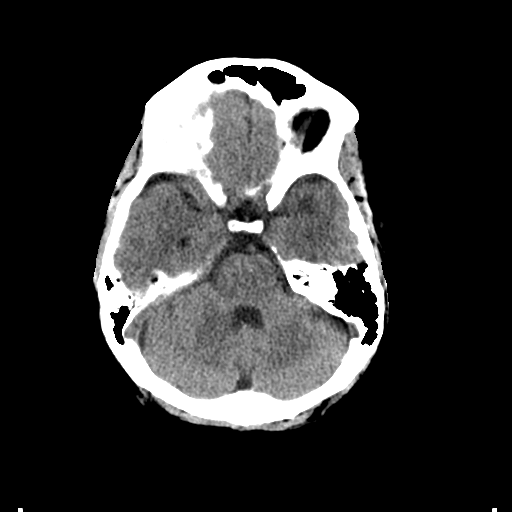
[im 9/28  brain]
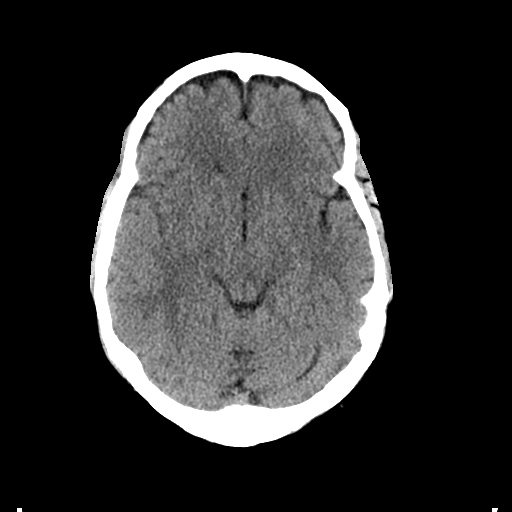
[im 11/28  brain]
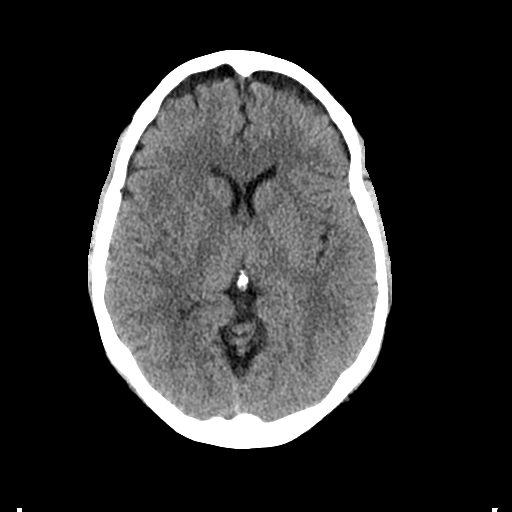
[im 17/28  brain]
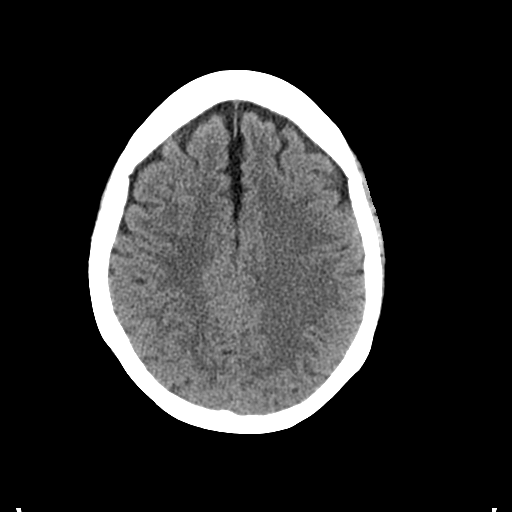
[im 17/28  bone]
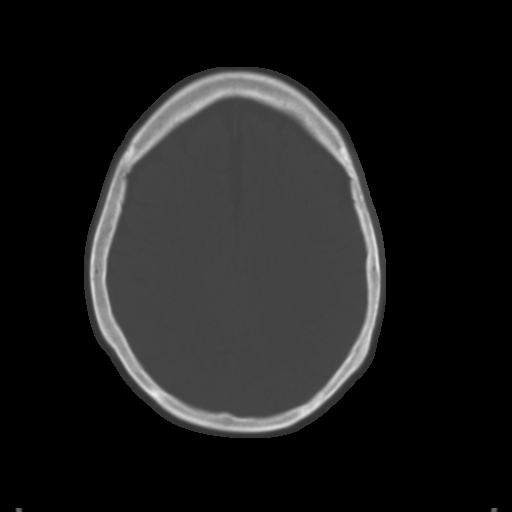
[im 19/28  brain]
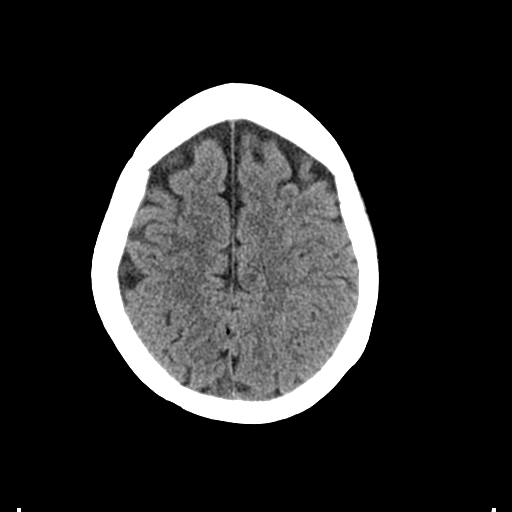
[im 22/28  brain]
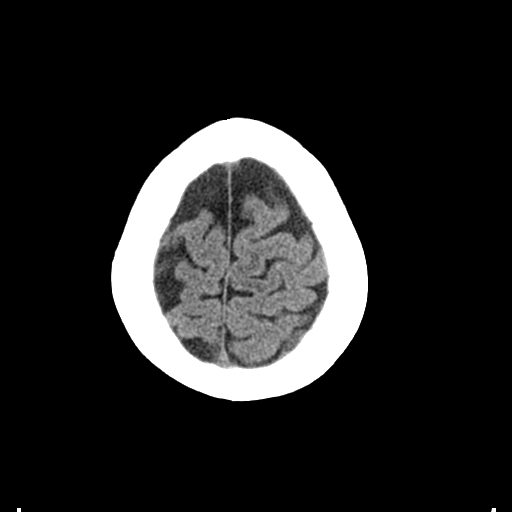
[im 25/28  brain]
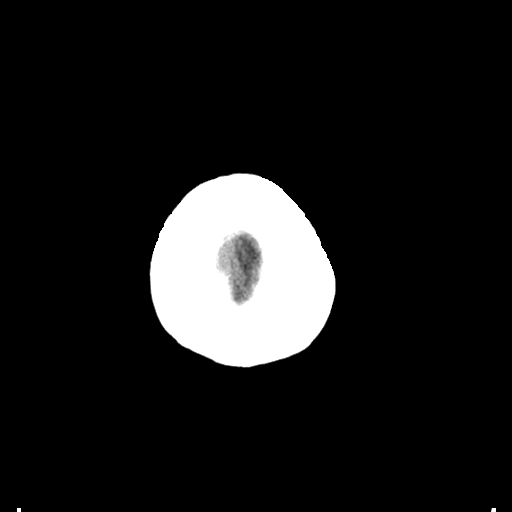

[Series 4: coronal soft tissue · coronal · 0.27mm/px · 3 of 84 slices shown]
[im 28/84  brain]
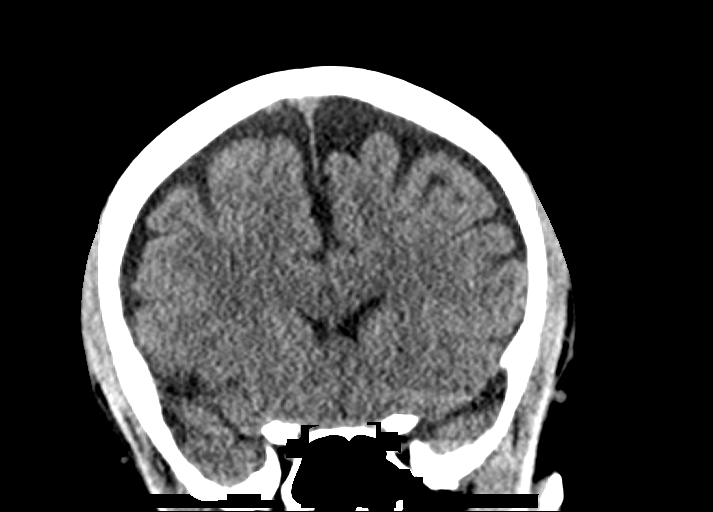
[im 37/84  brain]
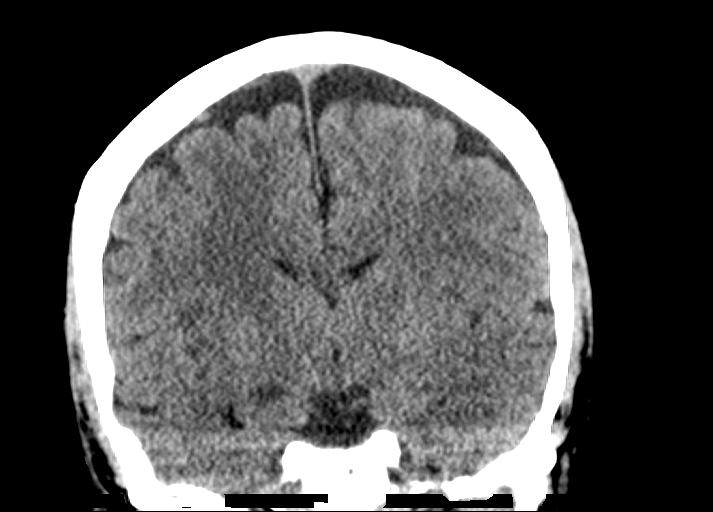
[im 47/84  brain]
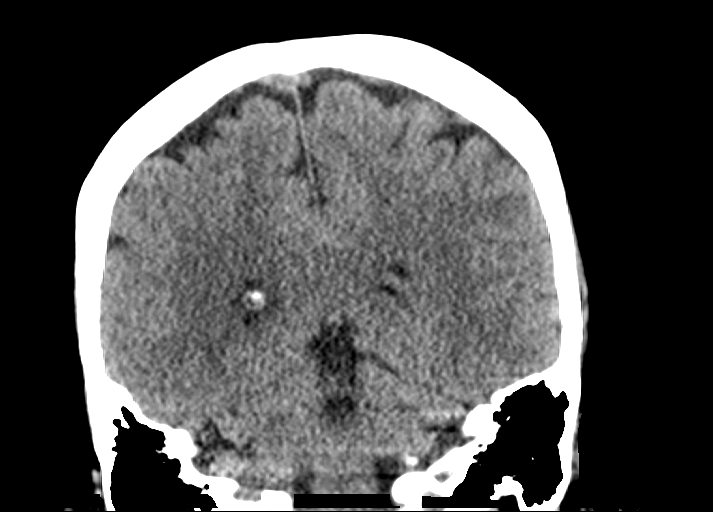

[Series 5: sagittal soft tissue · sagittal · 0.27mm/px · 3 of 66 slices shown]
[im 22/66  brain]
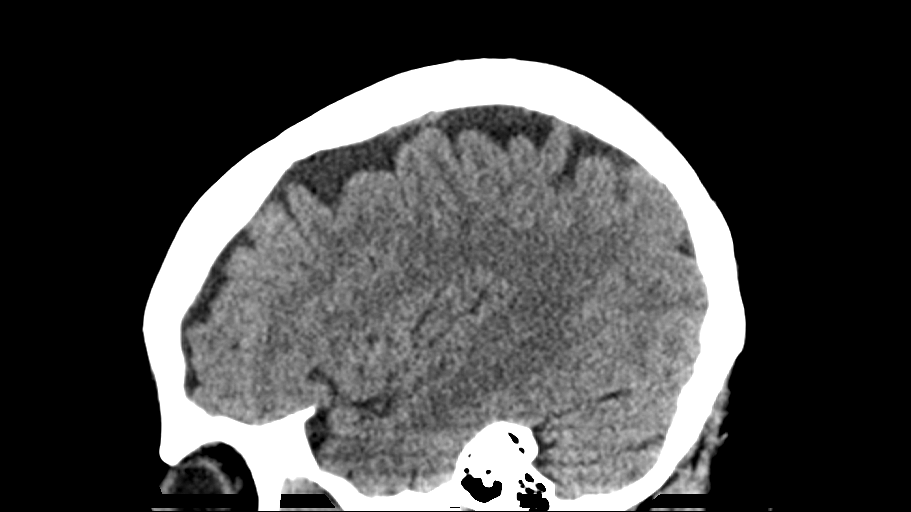
[im 33/66  brain]
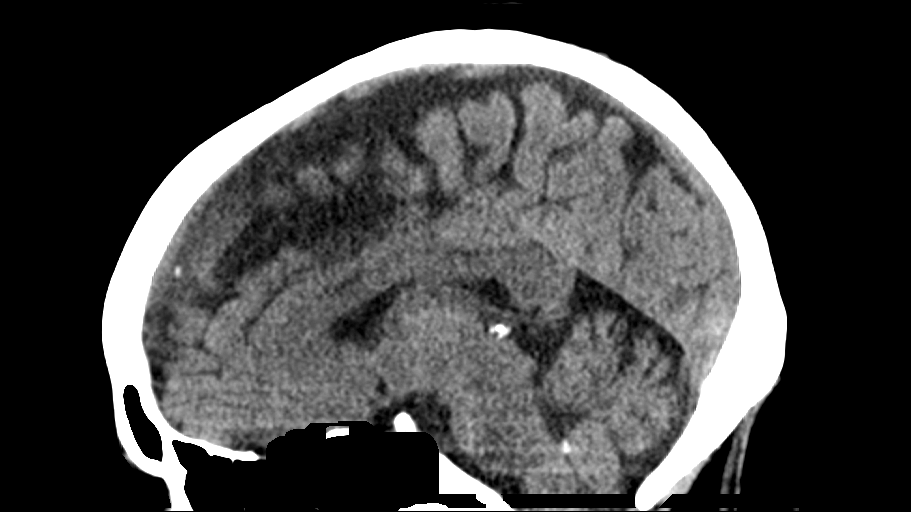
[im 44/66  brain]
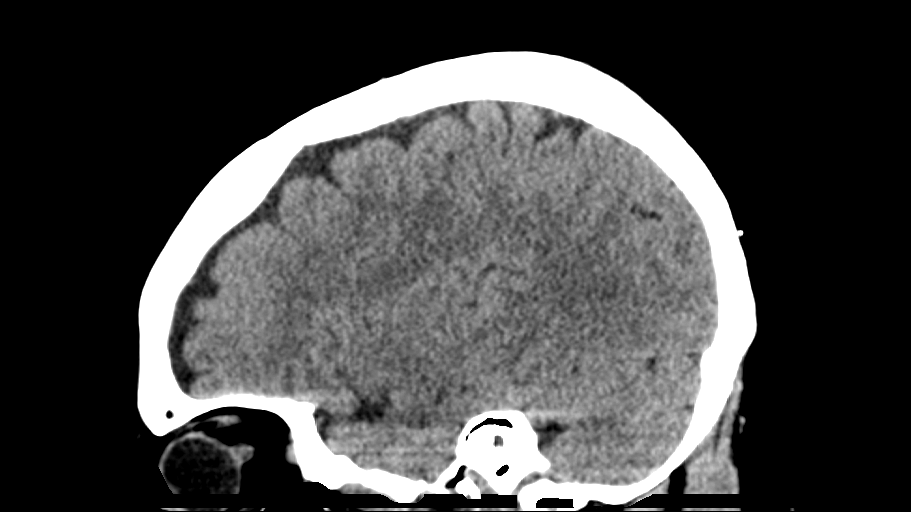

[14 of 47 positions shown; findings below may reference images not displayed]

FINDINGS: Brain: No evidence of acute infarction, hemorrhage, hydrocephalus,
extra-axial collection or mass lesion/mass effect.

Mild generalized volume loss noted.

Vascular: No hyperdense vessel or unexpected calcification.

Skull: Normal. Negative for fracture or focal lesion.

Sinuses/Orbits: No acute finding.

Other: None.
IMPRESSION: No evidence of acute intracranial abnormality.

## 2019-09-12 ENCOUNTER — Telehealth: Payer: Self-pay | Admitting: Hematology and Oncology

## 2019-09-12 NOTE — Telephone Encounter (Signed)
Rescheduled per 8/5 provider message. Patient is aware of updated appointment date and time.

## 2019-12-05 ENCOUNTER — Encounter: Payer: Self-pay | Admitting: Hematology and Oncology

## 2020-08-20 ENCOUNTER — Other Ambulatory Visit: Payer: Self-pay | Admitting: Hematology and Oncology

## 2020-08-21 ENCOUNTER — Ambulatory Visit: Payer: No Typology Code available for payment source | Admitting: Hematology and Oncology

## 2020-08-21 ENCOUNTER — Encounter: Payer: Self-pay | Admitting: Hematology and Oncology

## 2020-08-22 ENCOUNTER — Inpatient Hospital Stay: Payer: No Typology Code available for payment source | Admitting: Hematology and Oncology

## 2020-09-18 NOTE — Progress Notes (Signed)
Patient Care Team: Forrest Moron, MD as PCP - General (Internal Medicine) Nicholas Lose, MD as Consulting Physician (Hematology and Oncology)  DIAGNOSIS:    ICD-10-CM   1. Malignant neoplasm of lower-outer quadrant of right breast of female, estrogen receptor positive ([place])  C50.511    Z17.0       SUMMARY OF ONCOLOGIC HISTORY: Oncology History  Breast cancer of lower-outer quadrant of right female breast ([place])  09/08/2012 Initial Diagnosis   Breast cancer of lower-outer quadrant of right female breast: ER 99% PR 100% HER-2 negative, Ki-67 70%   10/07/2012 - 03/17/2013 Neo-Adjuvant Chemotherapy   Dose dense FEC followed by weekly Taxol   04/06/2013 Surgery   Right breast lumpectomy residual 1.1 cm IDC grade 3 with high-grade DCIS, PNI +ve, 1/2 SLN positive, no MVI, T1 C. N1 A. stage IIA, ER 97% PR 95% HER-2 1.1 ratio negative   05/08/2013 - 06/22/2013 Radiation Therapy   Radiation therapy to lumpectomy site   07/03/2013 - 04/27/2017 Anti-estrogen oral therapy   Zoladex plus Aromasin    Procedure   Genetic testing:PTEN C.-([id number])del 26 on [employer] testing   04/15/2017 Relapse/Recurrence   Right breast biopsy: Invasive ductal carcinoma, grade 3, ER 10%, PR 0%, Ki-67 30%, HER-2 equivocal, ratio 1.36, copy #5.2, IHC 1+ negative   04/22/2017 Breast MRI   Malignancy LOQ right breast 2.3 cm within the lumpectomy scar, left breast normal   06/02/2017 - 09/03/2017 Neo-Adjuvant Chemotherapy   Carboplatin and gemcitabine neoadjuvant chemotherapy   06/11/2017 - 06/12/2017 Hospital Admission   Syncope due to anemia required blood transfusion   09/23/2017 Breast MRI   Persistent enhancement right breast malignancy at 6:00 2.4 x 1.7 x 0.8 cm enhancement in the right nipple could be physiologic    10/05/2017 Surgery   Right mastectomy: IDC grade 3, 3 cm, with high-grade DCIS, margins negative, 0/2 lymph nodes negative, ER 10%, PR 0%, HER-2 negative, Ki-67 30% ypT2ypN0    10/25/2017 -   Anti-estrogen oral therapy   Aromasin 71m daily, plan for 5 years   Malignant neoplasm of lower-outer quadrant of right breast of female, estrogen receptor positive ([place]    CHIEF COMPLIANT: Follow-up of breast cancer on exemestane therapy  INTERVAL HISTORY: Mary AMBORNis a 50y.o. with above-mentioned history of  triple negative recurrent breast cancer who underwent a right mastectomy and is currently on antiestrogen therapy with exemestane. Mammogram on 11/17/19 showed no evidence of malignancy in the left breast. She presents to the clinic today for follow-up.   ALLERGIES:  has No Known Allergies.  MEDICATIONS:  Current Outpatient Medications  Medication Sig Dispense Refill   exemestane (AROMASIN) 25 MG tablet TAKE 1 TABLET BY MOUTH ONCE DAILY AFTER  BREAKFAST 90 tablet 0   metFORMIN (GLUCOPHAGE) 500 MG tablet TAKE 1 TABLET BY MOUTH TWICE DAILY WITH A MEAL 180 tablet 0   No current facility-administered medications for this visit.    PHYSICAL EXAMINATION: ECOG PERFORMANCE STATUS: 1 - Symptomatic but completely ambulatory  Vitals:   09/19/20 1017  BP: (!) 114/58  Pulse: 98  Resp: 18  Temp: 97.7 F (36.5 C)  SpO2: 98%   Filed Weights   09/19/20 1017  Weight: 181 lb 11.2 oz (82.4 kg)    BREAST: No palpable masses or nodules in either right or left breasts. No palpable axillary supraclavicular or infraclavicular adenopathy no breast tenderness or nipple discharge. (exam performed in the presence of a chaperone)  LABORATORY DATA:  I have reviewed the data  as listed CMP Latest Ref Rng & Units 12/17/2018 12/16/2018 12/15/2018  Glucose 70 - 99 mg/dL 162(H) 103(H) 128(H)  BUN 6 - 20 mg/dL 7 <5(L) <5(L)  Creatinine 0.44 - 1.00 mg/dL 0.64 0.62 0.64  Sodium 135 - 145 mmol/L 138 139 139  Potassium 3.5 - 5.1 mmol/L 3.9 3.5 3.4(L)  Chloride 98 - 111 mmol/L 102 104 106  CO2 22 - 32 mmol/L _0 Calcium 8.9 - 10.3 mg/dL 8.7(L) 8.8(L) 8.9  Total Protein 6.5 - 8.1 g/dL  6.5 6.4(L) 6.4(L)  Total Bilirubin 0.3 - 1.2 mg/dL 4.3(H) 6.3(H) 9.6(H)  Alkaline Phos 38 - 126 U/L 108 118 115  AST 15 - 41 U/L 59(H) 37 45(H)  ALT 0 - 44 U/L 111(H) 99(H) 120(H)    Lab Results  Component Value Date   WBC 12.9 (H) 12/17/2018   HGB 10.7 (L) 12/17/2018   HCT 31.3 (L) 12/17/2018   MCV 94.6 12/17/2018   PLT 174 12/17/2018   NEUTROABS 7.5 04/25/2018    ASSESSMENT & PLAN:  Breast cancer of lower-outer quadrant of right female breast ([place]) 2014: Right breast invasive ductal carcinoma ER/PR positive HER-2 negative stage II A. T3, N1, M0 with PTEN mutation Prior treatment: Neoadjuvant chemo with FEC Taxol right lumpectomy, radiation, adjuvant Zoladex and exemestane started 07/03/2013 ------------------------------------------------------------------------------  Breast MRI 04/22/2017: 2.3 cm malignancy lower outer quadrant right breast within the lumpectomy scar   Recommendation: 1.  Neoadjuvant chemotherapy with carboplatin and gemcitabine day 1 and 8 every 3 weeks for 6 cycles 2. followed by mastectomy 3.  Followed by continued antiestrogen therapy  PET/CT scan: Negative for distant metastasis -------------------------------------------------------------------------------- 10/05/2017:Right mastectomy: IDC grade 3, 3 cm, with high-grade DCIS, margins negative, 0/2 lymph nodes negative, ER 10%, PR 0%, HER-2 negative, Ki-67 30% ypT2ypN0  11/08/2017: Hysterectomy and bilateral salpingo-oophorectomy: Benign   Current treatment: Exemestane 25 mg daily restarted 10/25/2017 x 5 years I recommended that she stay on it for 2 more years. She will need a bone density test and I ordered 1 to be done at [facility].   Exemestane toxicities: Tolerating it extremely well. Cholecystectomy   Breast cancer surveillance: Left breast mammogram: [facility] 11/17/2019: Benign, breast density category A     Patient has a small farm and raises goats, chickens, dogs and pigs Her mother is also a  patient of mine and has started developing dementia.  Return to clinic in 1 year for follow-up    No orders of the defined types were placed in this encounter.  The patient has a good understanding of the overall plan. she agrees with it. she will call with any problems that may develop before the next visit here.  Total time spent: 30 mins including face to face time and time spent for planning, charting and coordination of care  Rulon Eisenmenger, MD, MPH 09/19/2020  I, Thana Ates, am acting as scribe for Dr. Nicholas Lose.  I have reviewed the above documentation for accuracy and completeness, and I agree with the above.

## 2020-09-19 ENCOUNTER — Inpatient Hospital Stay
Payer: No Typology Code available for payment source | Attending: Hematology and Oncology | Admitting: Hematology and Oncology

## 2020-09-19 ENCOUNTER — Other Ambulatory Visit: Payer: Self-pay

## 2020-09-19 VITALS — BP 114/58 | HR 98 | Temp 97.7°F | Resp 18 | Ht 65.0 in | Wt 181.7 lb

## 2020-09-19 DIAGNOSIS — Z7984 Long term (current) use of oral hypoglycemic drugs: Secondary | ICD-10-CM | POA: Diagnosis not present

## 2020-09-19 DIAGNOSIS — C50511 Malignant neoplasm of lower-outer quadrant of right female breast: Secondary | ICD-10-CM | POA: Diagnosis not present

## 2020-09-19 DIAGNOSIS — Z170 Estrogen receptor positive status [ER+]: Secondary | ICD-10-CM | POA: Diagnosis not present

## 2020-09-19 DIAGNOSIS — Z9011 Acquired absence of right breast and nipple: Secondary | ICD-10-CM | POA: Diagnosis not present

## 2020-09-19 DIAGNOSIS — Z780 Asymptomatic menopausal state: Secondary | ICD-10-CM | POA: Diagnosis not present

## 2020-09-19 DIAGNOSIS — Z79811 Long term (current) use of aromatase inhibitors: Secondary | ICD-10-CM | POA: Diagnosis not present

## 2020-09-19 DIAGNOSIS — Z79899 Other long term (current) drug therapy: Secondary | ICD-10-CM | POA: Insufficient documentation

## 2020-09-19 MED ORDER — ERGOCALCIFEROL 1.25 MG (50000 UT) PO CAPS: 50000.0000 [IU] | ORAL_CAPSULE | ORAL | Status: AC

## 2020-09-19 MED ORDER — EXEMESTANE 25 MG PO TABS: ORAL_TABLET | ORAL | 3 refills | Status: DC

## 2020-09-19 NOTE — Assessment & Plan Note (Addendum)
2014:Right breast invasive ductal carcinoma ER/PR positive HER-2 negative stage II A. T3, N1, M0 with PTEN mutation Priortreatment:Neoadjuvant chemo with FEC Taxol right lumpectomy, radiation, adjuvant Zoladex and exemestane started 07/03/2013 ------------------------------------------------------------------------------ Breast MRI 04/22/2017: 2.3 cm malignancy lower outer quadrant right breast within the lumpectomy scar  Recommendation: 1.Neoadjuvant chemotherapy with carboplatin and gemcitabine day 1 and 8 every 3 weeks for 6 cycles 2.followed by mastectomy 3.Followed by continuedantiestrogen therapy  PET/CT scan: Negative for distant metastasis -------------------------------------------------------------------------------- 10/05/2017:Right mastectomy: IDC grade 3, 3 cm, with high-grade DCIS, margins negative, 0/2 lymph nodes negative, ER 10%, PR 0%, HER-2 negative, Ki-67 30% ypT2ypN0 11/08/2017: Hysterectomy and bilateral salpingo-oophorectomy: Benign  Current treatment:Exemestane 25 mg daily restarted 10/25/2017 x 5 years  Exemestane toxicities:Tolerating it extremely well. Cholecystectomy  Breast cancer surveillance: Left breast mammogram: 11/17/2019: Benign, breast density category A Breast exam: 09/19/2020: Benign  Patient has a small farm and raises goats, chickens, dogs and pigs Return to clinic in 1 year for follow-up

## 2020-11-22 ENCOUNTER — Telehealth: Payer: Self-pay | Admitting: *Deleted

## 2020-11-22 NOTE — Telephone Encounter (Signed)
Received bone density report from [facility] showing T score -1.6.  Per MD pt needing to take OTC calcium 1200 mg daily as well as Vitamin D 1000 mg daily and continue preforming wt bearing exercises. No answer, LVM for pt to return call to the office.

## 2021-09-15 ENCOUNTER — Encounter: Payer: Self-pay | Admitting: Hematology and Oncology

## 2021-09-18 NOTE — Progress Notes (Signed)
Patient Care Team: Doristine Bosworth, MD as PCP - General (Internal Medicine) Serena Croissant, MD as Consulting Physician (Hematology and Oncology)  DIAGNOSIS:  Encounter Diagnosis  Name Primary?   Malignant neoplasm of lower-outer quadrant of right breast of female, estrogen receptor positive ([facility]) Yes    SUMMARY OF ONCOLOGIC HISTORY: Oncology History  Breast cancer of lower-outer quadrant of right female breast ([facility])  09/08/2012 Initial Diagnosis   Breast cancer of lower-outer quadrant of right female breast: ER 99% PR 100% HER-2 negative, Ki-67 70%   10/07/2012 - 03/17/2013 Neo-Adjuvant Chemotherapy   Dose dense FEC followed by weekly Taxol   04/06/2013 Surgery   Right breast lumpectomy residual 1.1 cm IDC grade 3 with high-grade DCIS, PNI +ve, 1/2 SLN positive, no MVI, T1 C. N1 A. stage IIA, ER 97% PR 95% HER-2 1.1 ratio negative   05/08/2013 - 06/22/2013 Radiation Therapy   Radiation therapy to lumpectomy site   07/03/2013 - 04/27/2017 Anti-estrogen oral therapy   Zoladex plus Aromasin    Procedure   Genetic testing:PTEN C.-(1088_1063)del 26 on [employer] testing   04/15/2017 Relapse/Recurrence   Right breast biopsy: Invasive ductal carcinoma, grade 3, ER 10%, PR 0%, Ki-67 30%, HER-2 equivocal, ratio 1.36, copy #5.2, IHC 1+ negative   04/22/2017 Breast MRI   Malignancy LOQ right breast 2.3 cm within the lumpectomy scar, left breast normal   06/02/2017 - 09/03/2017 Neo-Adjuvant Chemotherapy   Carboplatin and gemcitabine neoadjuvant chemotherapy   06/11/2017 - 06/12/2017 Hospital Admission   Syncope due to anemia required blood transfusion   09/23/2017 Breast MRI   Persistent enhancement right breast malignancy at 6:00 2.4 x 1.7 x 0.8 cm enhancement in the right nipple could be physiologic    10/05/2017 Surgery   Right mastectomy: IDC grade 3, 3 cm, with high-grade DCIS, margins negative, 0/2 lymph nodes negative, ER 10%, PR 0%, HER-2 negative, Ki-67 30% ypT2ypN0    10/25/2017 -   Anti-estrogen oral therapy   Aromasin 25mg  daily, plan for 5 years   Malignant neoplasm of lower-outer quadrant of right breast of female, estrogen receptor positive ([facility])    CHIEF COMPLIANT: Follow-up of breast cancer on exemestane therapy  INTERVAL HISTORY: Mary Dougherty is a 51 y.o. with above-mentioned history of  triple negative recurrent breast cancer who underwent a right mastectomy and is currently on antiestrogen therapy with exemestane. She presents to the clinic today for a follow-up. She states that she is doing good. She has been tolerating the exemestane but she does complains of hip pain. 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 She doesn't really get time for exercise but she does feed her animals so she gets steps in then. She has cows turkeys and goats. She denies any pain or discomfort in breast.   ALLERGIES:  has No Known Allergies.  MEDICATIONS:  Current Outpatient Medications  Medication Sig Dispense Refill   ergocalciferol (VITAMIN D2) 1.25 MG (50000 UT) capsule Take 1 capsule (50,000 Units total) by mouth once a week.     exemestane (AROMASIN) 25 MG tablet TAKE 1 TABLET BY MOUTH ONCE DAILY AFTER  BREAKFAST 90 tablet 3   metFORMIN (GLUCOPHAGE) 500 MG tablet TAKE 1 TABLET BY MOUTH TWICE DAILY WITH A MEAL 180 tablet 0   No current facility-administered medications for this visit.    PHYSICAL EXAMINATION: ECOG PERFORMANCE STATUS: 1 - Symptomatic but completely ambulatory  Vitals:   09/22/21 0840  BP: 139/72  Pulse: 76  Resp: 18  Temp: (!) 97.5 F (36.4 C)  SpO2: 100%   Filed Weights   09/22/21 0840  Weight: 180 lb 12.8 oz (82 kg)    BREAST: Right mastectomy scars without any lumps or nodules.  Left breast is without any lumps or nodules.  (exam performed in the presence of a chaperone)  LABORATORY DATA:  I have reviewed the data as listed    Latest Ref Rng & Units 12/17/2018    3:59 AM 12/16/2018    5:05  AM 12/15/2018    6:51 AM  CMP  Glucose 70 - 99 mg/dL 162  103  128   BUN 6 - 20 mg/dL 7  <5  <5   Creatinine 0.44 - 1.00 mg/dL 0.64  0.62  0.64   Sodium 135 - 145 mmol/L 138  139  139   Potassium 3.5 - 5.1 mmol/L 3.9  3.5  3.4   Chloride 98 - 111 mmol/L 102  104  106   CO2 22 - 32 mmol/L $RemoveB'25  23  25   'PNUQZMZw$ Calcium 8.9 - 10.3 mg/dL 8.7  8.8  8.9   Total Protein 6.5 - 8.1 g/dL 6.5  6.4  6.4   Total Bilirubin 0.3 - 1.2 mg/dL 4.3  6.3  9.6   Alkaline Phos 38 - 126 U/L 108  118  115   AST 15 - 41 U/L 59  37  45   ALT 0 - 44 U/L 111  99  120     Lab Results  Component Value Date   WBC 12.9 (H) 12/17/2018   HGB 10.7 (L) 12/17/2018   HCT 31.3 (L) 12/17/2018   MCV 94.6 12/17/2018   PLT 174 12/17/2018   NEUTROABS 7.5 04/25/2018    ASSESSMENT & PLAN:  Breast cancer of lower-outer quadrant of right female breast ([place]) 2014: Right breast invasive ductal carcinoma ER/PR positive HER-2 negative stage II A. T3, N1, M0 with PTEN mutation Prior treatment: Neoadjuvant chemo with FEC Taxol right lumpectomy, radiation, adjuvant Zoladex and exemestane started 07/03/2013 ------------------------------------------------------------------------------  Breast MRI 04/22/2017: 2.3 cm malignancy lower outer quadrant right breast within the lumpectomy scar   Recommendation: 1.  Neoadjuvant chemotherapy with carboplatin and gemcitabine day 1 and 8 every 3 weeks for 6 cycles 2. followed by mastectomy 3.  Followed by continued antiestrogen therapy  PET/CT scan: Negative for distant metastasis -------------------------------------------------------------------------------- 10/05/2017:Right mastectomy: IDC grade 3, 3 cm, with high-grade DCIS, margins negative, 0/2 lymph nodes negative, ER 10%, PR 0%, HER-2 negative, Ki-67 30% ypT2ypN0  11/08/2017: Hysterectomy and bilateral salpingo-oophorectomy: Benign   Current treatment: Exemestane 25 mg daily restarted 10/25/2017 x 5 years I recommended that she stay on it for 1  more year. Toxicity: Bilateral hip discomfort     Exemestane toxicities: Tolerating it extremely well. Cholecystectomy   Breast cancer surveillance: Left breast mammogram: [facility]  11/19/2020: Benign, breast density category A Breast exam 09/22/2021: Benign Bone density 11/19/2020: T score -1.6: Osteopenia    Patient has a small farm and raises goats, chickens, dogs and pigs Her mother is also a patient of mine and has started developing dementia.  She brings her along because she cannot be left alone anymore.   Return to clinic in 1 year for follow-up    No orders of the defined types were placed in this encounter.  The patient has a good understanding of the overall plan. she agrees with it. she will call with any problems that may develop before the  next visit here. Total time spent: 30 mins including face to face time and time spent for planning, charting and co-ordination of care   Harriette Ohara, MD 09/22/21

## 2021-09-22 ENCOUNTER — Inpatient Hospital Stay: Payer: 59 | Attending: Hematology and Oncology | Admitting: Hematology and Oncology

## 2021-09-22 ENCOUNTER — Other Ambulatory Visit: Payer: Self-pay

## 2021-09-22 VITALS — BP 139/72 | HR 76 | Temp 97.5°F | Resp 18 | Ht 65.0 in | Wt 180.8 lb

## 2021-09-22 DIAGNOSIS — Z79811 Long term (current) use of aromatase inhibitors: Secondary | ICD-10-CM | POA: Insufficient documentation

## 2021-09-22 DIAGNOSIS — M8580 Other specified disorders of bone density and structure, unspecified site: Secondary | ICD-10-CM | POA: Diagnosis not present

## 2021-09-22 DIAGNOSIS — Z170 Estrogen receptor positive status [ER+]: Secondary | ICD-10-CM | POA: Insufficient documentation

## 2021-09-22 DIAGNOSIS — C50511 Malignant neoplasm of lower-outer quadrant of right female breast: Secondary | ICD-10-CM | POA: Diagnosis present

## 2021-09-22 DIAGNOSIS — Z9011 Acquired absence of right breast and nipple: Secondary | ICD-10-CM | POA: Diagnosis not present

## 2021-09-22 MED ORDER — EXEMESTANE 25 MG PO TABS: ORAL_TABLET | ORAL | 3 refills | Status: DC

## 2021-09-22 NOTE — Assessment & Plan Note (Addendum)
2014:Right breast invasive ductal carcinoma ER/PR positive HER-2 negative stage II A. T3, N1, M0 with PTEN mutation Priortreatment:Neoadjuvant chemo with FEC Taxol right lumpectomy, radiation, adjuvant Zoladex and exemestane started 07/03/2013 ------------------------------------------------------------------------------ Breast MRI 04/22/2017: 2.3 cm malignancy lower outer quadrant right breast within the lumpectomy scar  Recommendation: 1.Neoadjuvant chemotherapy with carboplatin and gemcitabine day 1 and 8 every 3 weeks for 6 cycles 2.followed by mastectomy 3.Followed by continuedantiestrogen therapy  PET/CT scan: Negative for distant metastasis -------------------------------------------------------------------------------- 10/05/2017:Right mastectomy: IDC grade 3, 3 cm, with high-grade DCIS, margins negative, 0/2 lymph nodes negative, ER 10%, PR 0%, HER-2 negative, Ki-67 30% ypT2ypN0 11/08/2017: Hysterectomy and bilateral salpingo-oophorectomy: Benign  Current treatment:Exemestane 25 mg daily restarted 10/25/2017 x 5 years I recommended that she stay on it for 1 more year. Toxicity: Bilateral hip discomfort    Exemestane toxicities:Tolerating it extremely well. Cholecystectomy  Breast cancer surveillance: Left breast mammogram: [facility]  11/19/2020: Benign, breast density categoryA Breast exam 09/22/2021: Benign Bone density 11/19/2020: T score -1.6: Osteopenia   Patient has a small farm and raises goats, chickens, dogs andpigs Her mother is also a patient of mine and has started developing dementia.  She brings her along because she cannot be left alone anymore.  Return to clinic in 1 year for follow-up

## 2021-11-27 ENCOUNTER — Encounter: Payer: Self-pay | Admitting: Hematology and Oncology

## 2022-09-23 ENCOUNTER — Inpatient Hospital Stay: Payer: 59 | Attending: Hematology and Oncology | Admitting: Hematology and Oncology

## 2022-09-23 VITALS — BP 135/61 | HR 71 | Temp 97.8°F | Resp 18 | Ht 65.0 in | Wt 182.3 lb

## 2022-09-23 DIAGNOSIS — Z79811 Long term (current) use of aromatase inhibitors: Secondary | ICD-10-CM | POA: Diagnosis not present

## 2022-09-23 DIAGNOSIS — Z923 Personal history of irradiation: Secondary | ICD-10-CM | POA: Insufficient documentation

## 2022-09-23 DIAGNOSIS — Z9011 Acquired absence of right breast and nipple: Secondary | ICD-10-CM | POA: Diagnosis not present

## 2022-09-23 DIAGNOSIS — C50511 Malignant neoplasm of lower-outer quadrant of right female breast: Secondary | ICD-10-CM | POA: Insufficient documentation

## 2022-09-23 DIAGNOSIS — Z170 Estrogen receptor positive status [ER+]: Secondary | ICD-10-CM | POA: Diagnosis not present

## 2022-09-23 DIAGNOSIS — Z9221 Personal history of antineoplastic chemotherapy: Secondary | ICD-10-CM | POA: Insufficient documentation

## 2022-09-23 NOTE — Progress Notes (Signed)
Patient Care Team: Doristine Bosworth, MD (Inactive) as PCP - General (Internal Medicine) Serena Croissant, MD as Consulting Physician (Hematology and Oncology)  DIAGNOSIS:  Encounter Diagnosis  Name Primary?   Malignant neoplasm of lower-outer quadrant of right breast of female, estrogen receptor positive ([facility]) Yes    SUMMARY OF ONCOLOGIC HISTORY: Oncology History  Breast cancer of lower-outer quadrant of right female breast ([facility])  09/08/2012 Initial Diagnosis   Breast cancer of lower-outer quadrant of right female breast: ER 99% PR 100% HER-2 negative, Ki-67 70%   10/07/2012 - 03/17/2013 Neo-Adjuvant Chemotherapy   Dose dense FEC followed by weekly Taxol   04/06/2013 Surgery   Right breast lumpectomy residual 1.1 cm IDC grade 3 with high-grade DCIS, PNI +ve, 1/2 SLN positive, no MVI, T1 C. N1 A. stage IIA, ER 97% PR 95% HER-2 1.1 ratio negative   05/08/2013 - 06/22/2013 Radiation Therapy   Radiation therapy to lumpectomy site   07/03/2013 - 04/27/2017 Anti-estrogen oral therapy   Zoladex plus Aromasin    Procedure   Genetic testing:PTEN C.-(1088_1063)del 26 on [employer] testing   04/15/2017 Relapse/Recurrence   Right breast biopsy: Invasive ductal carcinoma, grade 3, ER 10%, PR 0%, Ki-67 30%, HER-2 equivocal, ratio 1.36, copy #5.2, IHC 1+ negative   04/22/2017 Breast MRI   Malignancy LOQ right breast 2.3 cm within the lumpectomy scar, left breast normal   06/02/2017 - 09/03/2017 Neo-Adjuvant Chemotherapy   Carboplatin and gemcitabine neoadjuvant chemotherapy   06/11/2017 - 06/12/2017 Hospital Admission   Syncope due to anemia required blood transfusion   09/23/2017 Breast MRI   Persistent enhancement right breast malignancy at 6:00 2.4 x 1.7 x 0.8 cm enhancement in the right nipple could be physiologic    10/05/2017 Surgery   Right mastectomy: IDC grade 3, 3 cm, with high-grade DCIS, margins negative, 0/2 lymph nodes negative, ER 10%, PR 0%, HER-2 negative, Ki-67 30% ypT2ypN0    10/25/2017  -  Anti-estrogen oral therapy   Aromasin 25mg  daily, plan for 5 years   Malignant neoplasm of lower-outer quadrant of right breast of female, estrogen receptor positive ([facility])    CHIEF COMPLIANT: Follow-up of breast cancer on exemestane therapy    INTERVAL HISTORY: Mary Dougherty is a 52 y.o. with above-mentioned history of  triple negative recurrent breast cancer who underwent a right mastectomy and is currently on antiestrogen therapy with exemestane. She presents to the clinic today for a follow-up.    ALLERGIES:  has No Known Allergies.  MEDICATIONS:  Current Outpatient Medications  Medication Sig Dispense Refill   ergocalciferol (VITAMIN D2) 1.25 MG (50000 UT) capsule Take 1 capsule (50,000 Units total) by mouth once a week.     metFORMIN (GLUCOPHAGE) 500 MG tablet TAKE 1 TABLET BY MOUTH TWICE DAILY WITH A MEAL 180 tablet 0   No current facility-administered medications for this visit.    PHYSICAL EXAMINATION: ECOG PERFORMANCE STATUS: 1 - Symptomatic but completely ambulatory  Vitals:   09/23/22 0830  BP: 135/61  Pulse: 71  Resp: 18  Temp: 97.8 F (36.6 C)  SpO2: 100%   Filed Weights   09/23/22 0830  Weight: 182 lb 4.8 oz (82.7 kg)    BREAST: No palpable masses or nodules in either right chest wall or left breasts. No palpable axillary supraclavicular or infraclavicular adenopathy no breast tenderness or nipple discharge. (exam performed in the presence of a chaperone)  LABORATORY DATA:  I have reviewed the data as listed    Latest Ref Rng & Units  12/17/2018    3:59 AM 12/16/2018    5:05 AM 12/15/2018    6:51 AM  CMP  Glucose 70 - 99 mg/dL 469  629  528   BUN 6 - 20 mg/dL 7  <5  <5   Creatinine 0.44 - 1.00 mg/dL 4.13  2.44  0.10   Sodium 135 - 145 mmol/L 138  139  139   Potassium 3.5 - 5.1 mmol/L 3.9  3.5  3.4   Chloride 98 - 111 mmol/L 102  104  106   CO2 22 - 32 mmol/L 25  23  25    Calcium 8.9 - 10.3 mg/dL 8.7  8.8  8.9   Total Protein 6.5 - 8.1 g/dL  6.5  6.4  6.4   Total Bilirubin 0.3 - 1.2 mg/dL 4.3  6.3  9.6   Alkaline Phos 38 - 126 U/L 108  118  115   AST 15 - 41 U/L 59  37  45   ALT 0 - 44 U/L 111  99  120     Lab Results  Component Value Date   WBC 12.9 (H) 12/17/2018   HGB 10.7 (L) 12/17/2018   HCT 31.3 (L) 12/17/2018   MCV 94.6 12/17/2018   PLT 174 12/17/2018   NEUTROABS 7.5 04/25/2018    ASSESSMENT & PLAN:  Breast cancer of lower-outer quadrant of right female breast ([facility]) 2014: Right breast invasive ductal carcinoma ER/PR positive HER-2 negative stage II A. T3, N1, M0 with PTEN mutation Prior treatment: Neoadjuvant chemo with FEC Taxol right lumpectomy, radiation, adjuvant Zoladex and exemestane started 07/03/2013 ------------------------------------------------------------------------------  Breast MRI 04/22/2017: 2.3 cm malignancy lower outer quadrant right breast within the lumpectomy scar   Recommendation: 1.  Neoadjuvant chemotherapy with carboplatin and gemcitabine day 1 and 8 every 3 weeks for 6 cycles 2. followed by mastectomy 3.  Followed by continued antiestrogen therapy  PET/CT scan: Negative for distant metastasis -------------------------------------------------------------------------------- 10/05/2017:Right mastectomy: IDC grade 3, 3 cm, with high-grade DCIS, margins negative, 0/2 lymph nodes negative, ER 10%, PR 0%, HER-2 negative, Ki-67 30% ypT2ypN0  11/08/2017: Hysterectomy and bilateral salpingo-oophorectomy: Benign   Current treatment: Exemestane 25 mg daily restarted 10/25/2017 x 5 years completed May 2024    Exemestane toxicities: Tolerating it extremely well. Cholecystectomy   Breast cancer surveillance: Left breast mammogram: [facility] 11/25/2021: Benign, breast density category A Breast exam 09/22/2021: Benign Bone density 11/19/2020: T score -1.6: Osteopenia    Patient has a small farm and raises goats, chickens, dogs and pigs Her mother is also a patient of mine and has dementia.   Return  to clinic in 1 year for follow-up with Mardella Layman for long-term survivorship    No orders of the defined types were placed in this encounter.  The patient has a good understanding of the overall plan. she agrees with it. she will call with any problems that may develop before the next visit here. Total time spent: 30 mins including face to face time and time spent for planning, charting and co-ordination of care   Tamsen Meek, MD 09/23/22    I Janan Ridge am acting as a Neurosurgeon for [employer]  I have reviewed the above documentation for accuracy and completeness, and I agree with the above.

## 2022-09-23 NOTE — Assessment & Plan Note (Addendum)
2014: Right breast invasive ductal carcinoma ER/PR positive HER-2 negative stage II A. T3, N1, M0 with PTEN mutation Prior treatment: Neoadjuvant chemo with FEC Taxol right lumpectomy, radiation, adjuvant Zoladex and exemestane started 07/03/2013 ------------------------------------------------------------------------------  Breast MRI 04/22/2017: 2.3 cm malignancy lower outer quadrant right breast within the lumpectomy scar   Recommendation: 1.  Neoadjuvant chemotherapy with carboplatin and gemcitabine day 1 and 8 every 3 weeks for 6 cycles 2. followed by mastectomy 3.  Followed by continued antiestrogen therapy  PET/CT scan: Negative for distant metastasis -------------------------------------------------------------------------------- 10/05/2017:Right mastectomy: IDC grade 3, 3 cm, with high-grade DCIS, margins negative, 0/2 lymph nodes negative, ER 10%, PR 0%, HER-2 negative, Ki-67 30% ypT2ypN0  11/08/2017: Hysterectomy and bilateral salpingo-oophorectomy: Benign   Current treatment: Exemestane 25 mg daily restarted 10/25/2017 x 5 years completed May 2024    Exemestane toxicities: Tolerating it extremely well. Cholecystectomy   Breast cancer surveillance: Left breast mammogram: [facility] 11/25/2021: Benign, breast density category A Breast exam 09/22/2021: Benign Bone density 11/19/2020: T score -1.6: Osteopenia    Patient has a small farm and raises goats, chickens, dogs and pigs Her mother is also a patient of mine and has dementia.   Return to clinic in 1 year for follow-up with Mardella Layman for long-term survivorship

## 2022-12-03 ENCOUNTER — Encounter: Payer: Self-pay | Admitting: Hematology and Oncology

## 2023-04-01 ENCOUNTER — Telehealth: Payer: Self-pay | Admitting: *Deleted

## 2023-04-01 NOTE — Telephone Encounter (Signed)
 Received call from pt with complaint of new lump found on left breast.  Pt denies any recent injury or trauma and requesting appt for breast exam.  [facility] visit scheduled, pt verbalized understanding of appt details.

## 2023-04-05 ENCOUNTER — Inpatient Hospital Stay: Payer: 59 | Attending: Adult Health | Admitting: Adult Health

## 2023-04-05 ENCOUNTER — Telehealth: Payer: Self-pay | Admitting: *Deleted

## 2023-04-05 VITALS — BP 132/65 | HR 79 | Temp 97.7°F | Resp 18 | Ht 65.0 in | Wt 185.2 lb

## 2023-04-05 DIAGNOSIS — Z9011 Acquired absence of right breast and nipple: Secondary | ICD-10-CM | POA: Insufficient documentation

## 2023-04-05 DIAGNOSIS — Z9221 Personal history of antineoplastic chemotherapy: Secondary | ICD-10-CM | POA: Diagnosis not present

## 2023-04-05 DIAGNOSIS — Z170 Estrogen receptor positive status [ER+]: Secondary | ICD-10-CM | POA: Diagnosis not present

## 2023-04-05 DIAGNOSIS — Z923 Personal history of irradiation: Secondary | ICD-10-CM | POA: Insufficient documentation

## 2023-04-05 DIAGNOSIS — Z1732 Human epidermal growth factor receptor 2 negative status: Secondary | ICD-10-CM | POA: Insufficient documentation

## 2023-04-05 DIAGNOSIS — Z79811 Long term (current) use of aromatase inhibitors: Secondary | ICD-10-CM | POA: Insufficient documentation

## 2023-04-05 DIAGNOSIS — Z79899 Other long term (current) drug therapy: Secondary | ICD-10-CM | POA: Insufficient documentation

## 2023-04-05 DIAGNOSIS — C50511 Malignant neoplasm of lower-outer quadrant of right female breast: Secondary | ICD-10-CM | POA: Diagnosis present

## 2023-04-05 DIAGNOSIS — Z1721 Progesterone receptor positive status: Secondary | ICD-10-CM | POA: Diagnosis not present

## 2023-04-05 DIAGNOSIS — N63 Unspecified lump in unspecified breast: Secondary | ICD-10-CM | POA: Diagnosis not present

## 2023-04-05 NOTE — Telephone Encounter (Signed)
 [place] orders and demographics along with demographics were faxed to [facility].

## 2023-04-05 NOTE — Assessment & Plan Note (Addendum)
 2014: Right breast invasive ductal carcinoma ER/PR positive HER-2 negative stage II A. T3, N1, M0 with PTEN mutation Prior treatment: Neoadjuvant chemo with FEC Taxol right lumpectomy, radiation, adjuvant Zoladex and exemestane started 07/03/2013 ------------------------------------------------------------------------------  Breast MRI 04/22/2017: 2.3 cm malignancy lower outer quadrant right breast within the lumpectomy scar   Recommendation: 1.  Neoadjuvant chemotherapy with carboplatin and gemcitabine day 1 and 8 every 3 weeks for 6 cycles 2. followed by mastectomy 3.  Followed by continued antiestrogen therapy  PET/CT scan: Negative for distant metastasis -------------------------------------------------------------------------------- 10/05/2017:Right mastectomy: IDC grade 3, 3 cm, with high-grade DCIS, margins negative, 0/2 lymph nodes negative, ER 10%, PR 0%, HER-2 negative, Ki-67 30% ypT2ypN0  11/08/2017: Hysterectomy and bilateral salpingo-oophorectomy: Benign   Current treatment: Exemestane 25 mg daily restarted 10/25/2017 x 5 years completed May 2024    Exemestane toxicities: Tolerating it extremely well. Cholecystectomy   Breast cancer surveillance: Left breast mammogram: [facility] 12/02/2022, benign, breast density A  Right chest wall nodule and left breast lump: will obtain mammo and ultrasound from [facility].    RTC after to review results PRN, otherwise f/u as scheduled with Dr. Pamelia Hoit.

## 2023-04-05 NOTE — Progress Notes (Signed)
 [facility] Cancer Follow up:    Mary Bosworth, MD (Inactive) No address on file   DIAGNOSIS:  Cancer Staging  Breast cancer of lower-outer quadrant of right female breast [facility]) Staging form: Breast, AJCC 7th Edition - Clinical: No stage assigned - Unsigned Histopathologic type: 9931 Laterality: Right Tumor size (mm): 11 - Pathologic: Stage IIA (T3, N1, cM0) - Unsigned Histopathologic type: 9931 Laterality: Right Tumor size (mm): 11 Staging comments: EP+PR+ Her2Neu-    SUMMARY OF ONCOLOGIC HISTORY: Oncology History  Breast cancer of lower-outer quadrant of right female breast (HCC)  09/08/2012 Initial Diagnosis   Breast cancer of lower-outer quadrant of right female breast: ER 99% PR 100% HER-2 negative, Ki-67 70%   10/07/2012 - 03/17/2013 Neo-Adjuvant Chemotherapy   Dose dense FEC followed by weekly Taxol   04/06/2013 Surgery   Right breast lumpectomy residual 1.1 cm IDC grade 3 with high-grade DCIS, PNI +ve, 1/2 SLN positive, no MVI, T1 C. N1 A. stage IIA, ER 97% PR 95% HER-2 1.1 ratio negative   05/08/2013 - 06/22/2013 Radiation Therapy   Radiation therapy to lumpectomy site   07/03/2013 - 04/27/2017 Anti-estrogen oral therapy   Zoladex plus Aromasin    Procedure   Genetic testing:PTEN C.-([id number])del 26 on [employer] testing   04/15/2017 Relapse/Recurrence   Right breast biopsy: Invasive ductal carcinoma, grade 3, ER 10%, PR 0%, Ki-67 30%, HER-2 equivocal, ratio 1.36, copy #5.2, IHC 1+ negative   04/22/2017 Breast MRI   Malignancy LOQ right breast 2.3 cm within the lumpectomy scar, left breast normal   06/02/2017 - 09/03/2017 Neo-Adjuvant Chemotherapy   Carboplatin and gemcitabine neoadjuvant chemotherapy   06/11/2017 - 06/12/2017 Hospital Admission   Syncope due to anemia required blood transfusion   09/23/2017 Breast MRI   Persistent enhancement right breast malignancy at 6:00 2.4 x 1.7 x 0.8 cm enhancement in the right nipple could be physiologic     10/05/2017 Surgery   Right mastectomy: IDC grade 3, 3 cm, with high-grade DCIS, margins negative, 0/2 lymph nodes negative, ER 10%, PR 0%, HER-2 negative, Ki-67 30% ypT2ypN0    10/25/2017 -  Anti-estrogen oral therapy   Aromasin 25mg  daily, plan for 5 years   Malignant neoplasm of lower-outer quadrant of right breast of female, estrogen receptor positive (HCC)    CURRENT THERAPY: observation  INTERVAL HISTORY: Mary Dougherty 53 y.o. female returns for follow up of a breast lump.  She notes that her left breast was itching a couple weeks ago and she felt a lump.  She saw her PCP for her diabetes who felt the lump also. She underwent a left breast mammogram on 12/02/2022 that was negative for malignancy and showed breast density category A.    Patient Active Problem List   Diagnosis Date Noted   Abnormal CT of the abdomen    Abdominal pain    Acute biliary pancreatitis 12/13/2018   Hyperbilirubinemia 12/13/2018   Transaminitis 12/13/2018   Leukocytosis 12/13/2018   GERD (gastroesophageal reflux disease) 12/13/2018   Breast cancer (HCC) 11/08/2017   Breast cancer, right (HCC) 10/05/2017   PTEN gene mutation 08/20/2017   Port-A-Cath in place 08/13/2017   Type 2 diabetes mellitus with complication, without long-term current use of insulin (HCC) 06/26/2017   Elevated bilirubin 06/26/2017   Neoplastic malignant related fatigue 06/26/2017   Syncope 06/11/2017   Anemia 06/11/2017   Diabetes mellitus without complication (HCC) 06/02/2017   Malignant neoplasm of lower-outer quadrant of right breast of female, estrogen receptor positive (HCC)  04/28/2017   HS (hereditary spherocytosis) (HCC) 09/20/2013   Anemia, unspecified 01/27/2013   Syncope, vasovagal 01/17/2013    Class: Chronic   Genital herpes 10/14/2012   Breast cancer of lower-outer quadrant of right female breast (HCC) 09/08/2012    has no known allergies.  MEDICAL HISTORY: Past Medical History:  Diagnosis Date   Anemia     Blood transfusion without reported diagnosis    Chronic anemia 03/03/13   Dyspnea    with exertion, during chemo   Family history of adverse reaction to anesthesia     father- hard to awaken   Hereditary spherocytosis (HCC)    Invasive ductal carcinoma of right breast (HCC) 09/2012   ER(99%), PR(100%), Ki-67(70%), 1/5 nodes positive   Pre-diabetes    off metformin now   Recurrent breast cancer, right (HCC) 09/2017   S/P radiation therapy 05/08/2013-06/22/2013   1) Right breast / 50.4 Gy in 28 fractions / 2) Right Supraclavicular fossa/ 50.4 Gy in 28 fractions /3) Right Posterior Axillary boost / 11.9 Gy in 28 fractions /4) Right breast boost / 10 Gy in 5 fractions    Spherocytosis (HCC) 1989   Status post chemotherapy 10/07/12 - 12/16/12    Neoadjuvant chemotherapy with dose dense FEC (5-FU/epirubicin/Cytoxan) from 10/07/2012 through 12/16/12.     Syncope and collapse 1989   "thats when I found out I have Spherocytosis"   Thyroid disease    Use of exemestane (Aromasin) 07/25/13   Use of goserelin acetate (Zoladex) 07/25/13   Wears glasses     SURGICAL HISTORY: Past Surgical History:  Procedure Laterality Date   BREAST LUMPECTOMY WITH SENTINEL LYMPH NODE BIOPSY Right 04/06/2013   Emelia Loron   BREAST SURGERY     left breast lumpectomy/left ax snbx   CHOLECYSTECTOMY N/A 12/16/2018   Procedure: LAPAROSCOPIC CHOLECYSTECTOMY WITH INTRAOPERATIVE CHOLANGIOGRAM;  Surgeon: Harriette Bouillon, MD;  Location: [facility] OR;  Service: General;  Laterality: N/A;   COLONOSCOPY  2015   MASTECTOMY W/ SENTINEL NODE BIOPSY Right 10/05/2017   Procedure: RIGHT MASTECTOMY WITH RIGHT SENTINEL LYMPH NODE BIOPSY;  Surgeon: Emelia Loron, MD;  Location: [facility];  Service: General;  Laterality: Right;   PORT-A-CATH REMOVAL N/A 04/06/2013   Procedure: REMOVAL PORT-A-CATH;  Surgeon: Emelia Loron, MD;  Location: [facility];  Service: General;  Laterality: N/A;   PORT-A-CATH  REMOVAL N/A 11/08/2017   Procedure: REMOVAL PORT-A-CATH;  Surgeon: Gaynelle Adu, MD;  Location: [facility];  Service: General;  Laterality: N/A;   PORTACATH PLACEMENT Left 09/20/2012   Procedure: INSERTION PORT-A-CATH;  Surgeon: Emelia Loron, MD;  Location: [facility];  Service: General;  Laterality: Left;   PORTACATH PLACEMENT Right 05/18/2017   Procedure: INSERTION PORT-A-CATH WITH ULTRASOUND;  Surgeon: Emelia Loron, MD;  Location: [facility] OR;  Service: General;  Laterality: Right;   ROBOTIC ASSISTED LAPAROSCOPIC LYSIS OF ADHESION  11/08/2017   Procedure: XI ROBOTIC ASSISTED LAPAROSCOPIC LYSIS OF BOWEL ADHESIONS;  Surgeon: Olivia Mackie, MD;  Location: [facility];  Service: Gynecology;;   ROBOTIC ASSISTED TOTAL HYSTERECTOMY WITH BILATERAL SALPINGO OOPHERECTOMY Bilateral 11/08/2017   Procedure: XI ROBOTIC ASSISTED TOTAL HYSTERECTOMY WITH BILATERAL SALPINGO OOPHORECTOMY;  Surgeon: Olivia Mackie, MD;  Location: [facility];  Service: Gynecology;  Laterality: Bilateral;  [facility] for 23 hr obs.   WISDOM TOOTH EXTRACTION      SOCIAL HISTORY: Social History   Socioeconomic History   Marital status: Single    Spouse name: Not on file   Number of children: Not on file  Years of education: Not on file   Highest education level: Not on file  Occupational History   Not on file  Tobacco Use   Smoking status: Never   Smokeless tobacco: Never  Vaping Use   Vaping status: Never Used  Substance and Sexual Activity   Alcohol use: No   Drug use: No   Sexual activity: Not Currently    Birth control/protection: Post-menopausal    Comment: menarche age 7, P0,  last menstrual cycle 08/26/2012  Other Topics Concern   Not on file  Social History Narrative   Not on file   Social Drivers of Health   Financial Resource Strain: Not on file  Food Insecurity: Not on file  Transportation Needs: Not on file  Physical Activity: Not on file  Stress: Not on file  Social Connections: Not on file   Intimate Partner Violence: Not on file    FAMILY HISTORY: Family History  Problem Relation Age of Onset   Pulmonary embolism Maternal Uncle 45       died instantly   Melanoma Cousin        paternal cousin dx in her 30s   Heart disease Father    Diabetes Father    Thyroid disease Mother     Review of Systems  Constitutional:  Negative for appetite change, chills, fatigue, fever and unexpected weight change.  HENT:   Negative for hearing loss, lump/mass and trouble swallowing.   Eyes:  Negative for eye problems and icterus.  Respiratory:  Negative for chest tightness, cough and shortness of breath.   Cardiovascular:  Negative for chest pain, leg swelling and palpitations.  Gastrointestinal:  Negative for abdominal distention, abdominal pain, constipation, diarrhea, nausea and vomiting.  Endocrine: Negative for hot flashes.  Genitourinary:  Negative for difficulty urinating.   Musculoskeletal:  Negative for arthralgias.  Skin:  Negative for itching and rash.  Neurological:  Negative for dizziness, extremity weakness, headaches and numbness.  Hematological:  Negative for adenopathy. Does not bruise/bleed easily.  Psychiatric/Behavioral:  Negative for depression. The patient is not nervous/anxious.       PHYSICAL EXAMINATION    Vitals:   04/05/23 0911  BP: 132/65  Pulse: 79  Resp: 18  Temp: 97.7 F (36.5 C)  SpO2: 100%    Physical Exam Constitutional:      General: She is not in acute distress.    Appearance: Normal appearance. She is not toxic-appearing.  HENT:     Head: Normocephalic and atraumatic.  Chest:     Comments: Right breast s/p mastectomy with small fixed nodule noted in central aspect of mastectomy site  Left lower inner breast with well circumscribed, soft nodule about 0.5cm in sized Lymphadenopathy:     Upper Body:     Right upper body: No supraclavicular or axillary adenopathy.     Left upper body: No supraclavicular or axillary adenopathy.   Neurological:     General: No focal deficit present.     Mental Status: She is alert.  Psychiatric:        Mood and Affect: Mood normal.        Behavior: Behavior normal.       ASSESSMENT and THERAPY PLAN:   Breast cancer of lower-outer quadrant of right female breast (HCC) 2014: Right breast invasive ductal carcinoma ER/PR positive HER-2 negative stage II A. T3, N1, M0 with PTEN mutation Prior treatment: Neoadjuvant chemo with FEC Taxol right lumpectomy, radiation, adjuvant Zoladex and exemestane started 07/03/2013 ------------------------------------------------------------------------------  Breast MRI  04/22/2017: 2.3 cm malignancy lower outer quadrant right breast within the lumpectomy scar   Recommendation: 1.  Neoadjuvant chemotherapy with carboplatin and gemcitabine day 1 and 8 every 3 weeks for 6 cycles 2. followed by mastectomy 3.  Followed by continued antiestrogen therapy  PET/CT scan: Negative for distant metastasis -------------------------------------------------------------------------------- 10/05/2017:Right mastectomy: IDC grade 3, 3 cm, with high-grade DCIS, margins negative, 0/2 lymph nodes negative, ER 10%, PR 0%, HER-2 negative, Ki-67 30% ypT2ypN0  11/08/2017: Hysterectomy and bilateral salpingo-oophorectomy: Benign   Current treatment: Exemestane 25 mg daily restarted 10/25/2017 x 5 years completed May 2024    Exemestane toxicities: Tolerating it extremely well. Cholecystectomy   Breast cancer surveillance: Left breast mammogram: [facility] 12/02/2022, benign, breast density A  Right chest wall nodule and left breast lump: will obtain mammo and ultrasound from [facility].    RTC after to review results PRN, otherwise f/u as scheduled with Dr. Pamelia Hoit.    All questions were answered. The patient knows to call the clinic with any problems, questions or concerns. We can certainly see the patient much sooner if necessary.  Total encounter time:20 minutes*in face-to-face  visit time, chart review, lab review, care coordination, order entry, and documentation of the encounter time.  Lillard Anes, NP 04/05/23 12:28 PM Medical Oncology and Hematology [facility] [address] [place], [place] [address] Tel. [phone]    Fax. [phone]  *Total Encounter Time as defined by the Centers for Medicare and Medicaid Services includes, in addition to the face-to-face time of a patient visit (documented in the note above) non-face-to-face time: obtaining and reviewing outside history, ordering and reviewing medications, tests or procedures, care coordination (communications with other health care professionals or caregivers) and documentation in the medical record.

## 2023-04-09 ENCOUNTER — Encounter: Payer: Self-pay | Admitting: Adult Health

## 2023-04-14 ENCOUNTER — Other Ambulatory Visit: Payer: Self-pay | Admitting: Radiology

## 2023-04-15 LAB — SURGICAL PATHOLOGY

## 2023-04-20 ENCOUNTER — Encounter: Payer: Self-pay | Admitting: Adult Health

## 2023-09-23 ENCOUNTER — Inpatient Hospital Stay: Payer: 59 | Attending: Adult Health | Admitting: Adult Health

## 2023-09-23 VITALS — BP 110/68 | HR 72 | Temp 98.2°F | Resp 18 | Ht 65.0 in | Wt 180.7 lb

## 2023-09-23 DIAGNOSIS — Z9221 Personal history of antineoplastic chemotherapy: Secondary | ICD-10-CM | POA: Insufficient documentation

## 2023-09-23 DIAGNOSIS — Z170 Estrogen receptor positive status [ER+]: Secondary | ICD-10-CM | POA: Insufficient documentation

## 2023-09-23 DIAGNOSIS — Z1721 Progesterone receptor positive status: Secondary | ICD-10-CM | POA: Diagnosis not present

## 2023-09-23 DIAGNOSIS — C50511 Malignant neoplasm of lower-outer quadrant of right female breast: Secondary | ICD-10-CM | POA: Insufficient documentation

## 2023-09-23 DIAGNOSIS — Z1732 Human epidermal growth factor receptor 2 negative status: Secondary | ICD-10-CM | POA: Diagnosis not present

## 2023-09-23 DIAGNOSIS — Z9011 Acquired absence of right breast and nipple: Secondary | ICD-10-CM | POA: Diagnosis not present

## 2023-09-23 NOTE — Progress Notes (Signed)
 [facility] Cancer Follow up:    Bettie Santana LABOR, MD (Inactive) No address on file   DIAGNOSIS:  Cancer Staging  Breast cancer of lower-outer quadrant of right female breast [facility]) Staging form: Breast, AJCC 7th Edition - Clinical: No stage assigned - Unsigned Histopathologic type: 9931 Laterality: Right Tumor size (mm): 11 - Pathologic: Stage IIA (T3, N1, cM0) - Unsigned Histopathologic type: 9931 Laterality: Right Tumor size (mm): 11 Staging comments: EP+PR+ Her2Neu-     SUMMARY OF ONCOLOGIC HISTORY: Oncology History  Breast cancer of lower-outer quadrant of right female breast (HCC)  09/08/2012 Initial Diagnosis   Breast cancer of lower-outer quadrant of right female breast: ER 99% PR 100% HER-2 negative, Ki-67 70%   10/07/2012 - 03/17/2013 Neo-Adjuvant Chemotherapy   Dose dense FEC followed by weekly Taxol    04/06/2013 Surgery   Right breast lumpectomy residual 1.1 cm IDC grade 3 with high-grade DCIS, PNI +ve, 1/2 SLN positive, no MVI, T1 C. N1 A. stage IIA, ER 97% PR 95% HER-2 1.1 ratio negative   05/08/2013 - 06/22/2013 Radiation Therapy   Radiation therapy to lumpectomy site   07/03/2013 - 04/27/2017 Anti-estrogen oral therapy   Zoladex  plus Aromasin     Procedure   Genetic testing:PTEN C.-([id number])del 26 on [employer] testing   04/15/2017 Relapse/Recurrence   Right breast biopsy: Invasive ductal carcinoma, grade 3, ER 10%, PR 0%, Ki-67 30%, HER-2 equivocal, ratio 1.36, copy #5.2, IHC 1+ negative   04/22/2017 Breast MRI   Malignancy LOQ right breast 2.3 cm within the lumpectomy scar, left breast normal   06/02/2017 - 09/03/2017 Neo-Adjuvant Chemotherapy   Carboplatin  and gemcitabine  neoadjuvant chemotherapy   06/11/2017 - 06/12/2017 Hospital Admission   Syncope due to anemia required blood transfusion   09/23/2017 Breast MRI   Persistent enhancement right breast malignancy at 6:00 2.4 x 1.7 x 0.8 cm enhancement in the right nipple could be physiologic     10/05/2017 Surgery   Right mastectomy: IDC grade 3, 3 cm, with high-grade DCIS, margins negative, 0/2 lymph nodes negative, ER 10%, PR 0%, HER-2 negative, Ki-67 30% ypT2ypN0    10/25/2017 -  Anti-estrogen oral therapy   Aromasin  25mg  daily, plan for 5 years   Malignant neoplasm of lower-outer quadrant of right breast of female, estrogen receptor positive (HCC)    CURRENT THERAPY: observation  INTERVAL HISTORY:  Discussed the use of AI scribe software for clinical note transcription with the patient, who gave verbal consent to proceed.  History of Present Illness Mary Dougherty is a 53 year old female with a history of right breast invasive ductal carcinoma who presents for follow-up after recent breast imaging and genetic testing.  Initially diagnosed in August 2014 with Stage 2A right breast invasive ductal carcinoma, ER and PR positive, she underwent neoadjuvant chemotherapy, right breast lumpectomy, adjuvant radiation, and antiestrogen therapy with Zoladex  plus Aromasin . Genetic testing revealed a PTEN VUS.  Our genetic counselors reached out to her testing company about the PTEN VUS and they are looking into whether there were any recent reclassifications with the variant.   In March 2019, she experienced a recurrence of ER and PR positive invasive ductal carcinoma in the right breast. Treatment included neoadjuvant chemotherapy with carboplatin  and Gemzar , followed by a right mastectomy and completion of five years of Aromasin .  In March 2025, a breast lump was discovered. An ultrasound needle core biopsy on April 14, 2023, showed benign breast tissue with fat necrosis, dense fibrosis, and microcalcifications, negative for atypia or malignancy.  Her most recent left breast screening mammogram on December 01, 2022, showed no mammographic evidence of malignancy.      Patient Active Problem List   Diagnosis Date Noted   Abnormal CT of the abdomen    Abdominal pain    Acute biliary  pancreatitis 12/13/2018   Hyperbilirubinemia 12/13/2018   Transaminitis 12/13/2018   Leukocytosis 12/13/2018   GERD (gastroesophageal reflux disease) 12/13/2018   Breast cancer (HCC) 11/08/2017   Breast cancer, right (HCC) 10/05/2017   PTEN gene mutation 08/20/2017   Port-A-Cath in place 08/13/2017   Type 2 diabetes mellitus with complication, without long-term current use of insulin  (HCC) 06/26/2017   Elevated bilirubin 06/26/2017   Neoplastic malignant related fatigue 06/26/2017   Syncope 06/11/2017   Anemia 06/11/2017   Diabetes mellitus without complication (HCC) 06/02/2017   Malignant neoplasm of lower-outer quadrant of right breast of female, estrogen receptor positive (HCC) 04/28/2017   HS (hereditary spherocytosis) (HCC) 09/20/2013   Anemia, unspecified 01/27/2013   Syncope, vasovagal 01/17/2013    Class: Chronic   Genital herpes 10/14/2012   Breast cancer of lower-outer quadrant of right female breast (HCC) 09/08/2012    has no known allergies.  MEDICAL HISTORY: Past Medical History:  Diagnosis Date   Anemia    Blood transfusion without reported diagnosis    Chronic anemia 03/03/13   Dyspnea    with exertion, during chemo   Family history of adverse reaction to anesthesia     father- hard to awaken   Hereditary spherocytosis (HCC)    Invasive ductal carcinoma of right breast (HCC) 09/2012   ER(99%), PR(100%), Ki-67(70%), 1/5 nodes positive   Pre-diabetes    off metformin  now   Recurrent breast cancer, right (HCC) 09/2017   S/P radiation therapy 05/08/2013-06/22/2013   1) Right breast / 50.4 Gy in 28 fractions / 2) Right Supraclavicular fossa/ 50.4 Gy in 28 fractions /3) Right Posterior Axillary boost / 11.9 Gy in 28 fractions /4) Right breast boost / 10 Gy in 5 fractions    Spherocytosis (HCC) 1989   Status post chemotherapy 10/07/12 - 12/16/12    Neoadjuvant chemotherapy with dose dense FEC (5-FU/epirubicin /Cytoxan ) from 10/07/2012 through 12/16/12.     Syncope and  collapse 1989   thats when I found out I have Spherocytosis   Thyroid disease    Use of exemestane  (Aromasin ) 07/25/13   Use of goserelin acetate  (Zoladex ) 07/25/13   Wears glasses     SURGICAL HISTORY: Past Surgical History:  Procedure Laterality Date   BREAST LUMPECTOMY WITH SENTINEL LYMPH NODE BIOPSY Right 04/06/2013   Donnice Bury   BREAST SURGERY     left breast lumpectomy/left ax snbx   CHOLECYSTECTOMY N/A 12/16/2018   Procedure: LAPAROSCOPIC CHOLECYSTECTOMY WITH INTRAOPERATIVE CHOLANGIOGRAM;  Surgeon: Vanderbilt Ned, MD;  Location: [facility] OR;  Service: General;  Laterality: N/A;   COLONOSCOPY  2015   MASTECTOMY W/ SENTINEL NODE BIOPSY Right 10/05/2017   Procedure: RIGHT MASTECTOMY WITH RIGHT SENTINEL LYMPH NODE BIOPSY;  Surgeon: Bury Donnice, MD;  Location: [facility];  Service: General;  Laterality: Right;   PORT-A-CATH REMOVAL N/A 04/06/2013   Procedure: REMOVAL PORT-A-CATH;  Surgeon: Donnice Bury, MD;  Location: [facility];  Service: General;  Laterality: N/A;   PORT-A-CATH REMOVAL N/A 11/08/2017   Procedure: REMOVAL PORT-A-CATH;  Surgeon: Tanda Locus, MD;  Location: [facility];  Service: General;  Laterality: N/A;   PORTACATH PLACEMENT Left 09/20/2012   Procedure: INSERTION PORT-A-CATH;  Surgeon: Donnice Bury, MD;  Location: [facility];  Service: General;  Laterality: Left;   PORTACATH PLACEMENT Right 05/18/2017   Procedure: INSERTION PORT-A-CATH WITH ULTRASOUND;  Surgeon: Ebbie Cough, MD;  Location: [facility];  Service: General;  Laterality: Right;   ROBOTIC ASSISTED LAPAROSCOPIC LYSIS OF ADHESION  11/08/2017   Procedure: XI ROBOTIC ASSISTED LAPAROSCOPIC LYSIS OF BOWEL ADHESIONS;  Surgeon: Gorge Ade, MD;  Location: [facility];  Service: Gynecology;;   ROBOTIC ASSISTED TOTAL HYSTERECTOMY WITH BILATERAL SALPINGO OOPHERECTOMY Bilateral 11/08/2017   Procedure: XI ROBOTIC ASSISTED TOTAL HYSTERECTOMY WITH BILATERAL SALPINGO  OOPHORECTOMY;  Surgeon: Gorge Ade, MD;  Location: [facility];  Service: Gynecology;  Laterality: Bilateral;  [facility] for 23 hr obs.   WISDOM TOOTH EXTRACTION      SOCIAL HISTORY: Social History   Socioeconomic History   Marital status: Single    Spouse name: Not on file   Number of children: Not on file   Years of education: Not on file   Highest education level: Not on file  Occupational History   Not on file  Tobacco Use   Smoking status: Never   Smokeless tobacco: Never  Vaping Use   Vaping status: Never Used  Substance and Sexual Activity   Alcohol use: No   Drug use: No   Sexual activity: Not Currently    Birth control/protection: Post-menopausal    Comment: menarche age 11, P0,  last menstrual cycle 08/26/2012  Other Topics Concern   Not on file  Social History Narrative   Not on file   Social Drivers of Health   Financial Resource Strain: Not on file  Food Insecurity: Not on file  Transportation Needs: Not on file  Physical Activity: Not on file  Stress: Not on file  Social Connections: Not on file  Intimate Partner Violence: Not on file    FAMILY HISTORY: Family History  Problem Relation Age of Onset   Pulmonary embolism Maternal Uncle 45       died instantly   Melanoma Cousin        paternal cousin dx in her 30s   Heart disease Father    Diabetes Father    Thyroid disease Mother     Review of Systems  Constitutional:  Negative for appetite change, chills, fatigue, fever and unexpected weight change.  HENT:   Negative for hearing loss, lump/mass and trouble swallowing.   Eyes:  Negative for eye problems and icterus.  Respiratory:  Negative for chest tightness, cough and shortness of breath.   Cardiovascular:  Negative for chest pain, leg swelling and palpitations.  Gastrointestinal:  Negative for abdominal distention, abdominal pain, constipation, diarrhea, nausea and vomiting.  Endocrine: Negative for hot flashes.  Genitourinary:  Negative for  difficulty urinating.   Musculoskeletal:  Negative for arthralgias.  Skin:  Negative for itching and rash.  Neurological:  Negative for dizziness, extremity weakness, headaches and numbness.  Hematological:  Negative for adenopathy. Does not bruise/bleed easily.  Psychiatric/Behavioral:  Negative for depression. The patient is not nervous/anxious.       PHYSICAL EXAMINATION    Vitals:   09/23/23 0852  BP: 110/68  Pulse: 72  Resp: 18  Temp: 98.2 F (36.8 C)  SpO2: 100%    Physical Exam Constitutional:      General: She is not in acute distress.    Appearance: Normal appearance. She is not toxic-appearing.  HENT:     Head: Normocephalic and atraumatic.     Mouth/Throat:     Mouth: Mucous membranes are moist.  Pharynx: Oropharynx is clear. No oropharyngeal exudate or posterior oropharyngeal erythema.  Eyes:     General: No scleral icterus. Cardiovascular:     Rate and Rhythm: Normal rate and regular rhythm.     Pulses: Normal pulses.     Heart sounds: Normal heart sounds.  Pulmonary:     Effort: Pulmonary effort is normal.     Breath sounds: Normal breath sounds.  Chest:     Comments: Right breast s/p mastectomy no sign of local recurrence, left breast benign  Abdominal:     General: Abdomen is flat. Bowel sounds are normal. There is no distension.     Palpations: Abdomen is soft.     Tenderness: There is no abdominal tenderness.  Musculoskeletal:        General: No swelling.     Cervical back: Neck supple.  Lymphadenopathy:     Cervical: No cervical adenopathy.     Upper Body:     Right upper body: No supraclavicular or axillary adenopathy.     Left upper body: No supraclavicular or axillary adenopathy.  Skin:    General: Skin is warm and dry.     Findings: No rash.  Neurological:     General: No focal deficit present.     Mental Status: She is alert.  Psychiatric:        Mood and Affect: Mood normal.        Behavior: Behavior normal.       ASSESSMENT and THERAPY PLAN:   Breast cancer of lower-outer quadrant of right female breast (HCC) 2014: Right breast invasive ductal carcinoma ER/PR positive HER-2 negative stage II A. T3, N1, M0 with PTEN mutation Prior treatment: Neoadjuvant chemo with FEC Taxol  right lumpectomy, radiation, adjuvant Zoladex  and exemestane  started 07/03/2013 ------------------------------------------------------------------------------  Breast MRI 04/22/2017: 2.3 cm malignancy lower outer quadrant right breast within the lumpectomy scar   Recommendation: 1.  Neoadjuvant chemotherapy with carboplatin  and gemcitabine  day 1 and 8 every 3 weeks for 6 cycles 2. followed by mastectomy 3.  Followed by continued antiestrogen therapy  PET/CT scan: Negative for distant metastasis -------------------------------------------------------------------------------- 10/05/2017:Right mastectomy: IDC grade 3, 3 cm, with high-grade DCIS, margins negative, 0/2 lymph nodes negative, ER 10%, PR 0%, HER-2 negative, Ki-67 30% ypT2ypN0  11/08/2017: Hysterectomy and bilateral salpingo-oophorectomy: Benign   Current treatment: Exemestane  25 mg daily restarted 10/25/2017 x 5 years completed May 2024    Assessment and Plan Assessment & Plan History of right breast invasive ductal carcinoma with recurrence, status post treatment Initially Stage 2A ER, PR positive. Treated with neoadjuvant chemotherapy, lumpectomy, radiation, and antiestrogen therapy. Recurrence treated with chemotherapy, mastectomy, and Aromasin . Recent biopsy negative for malignancy. No current signs of recurrence. - Continue annual mammograms for the left breast. -Counseled patient on healthy diet and exercise  PTEN genetic variant of uncertain significance.  Genetic counseling looking into whether variant has been reclassified.   - Reach out to patient after we hear back from genetic testing company next week.    RTC in 1 year for continued long term follow  up.    All questions were answered. The patient knows to call the clinic with any problems, questions or concerns. We can certainly see the patient much sooner if necessary.  Total encounter time:30 minutes*in face-to-face visit time, chart review, lab review, care coordination, order entry, and documentation of the encounter time.    Morna Kendall, NP 09/24/23 10:46 PM Medical Oncology and Hematology [facility] [address] [place], [place]  [address] Tel. [phone]    Fax. [phone]  *Total Encounter Time as defined by the Centers for Medicare and Medicaid Services includes, in addition to the face-to-face time of a patient visit (documented in the note above) non-face-to-face time: obtaining and reviewing outside history, ordering and reviewing medications, tests or procedures, care coordination (communications with other health care professionals or caregivers) and documentation in the medical record.

## 2023-09-24 NOTE — Assessment & Plan Note (Signed)
 2014: Right breast invasive ductal carcinoma ER/PR positive HER-2 negative stage II A. T3, N1, M0 with PTEN mutation Prior treatment: Neoadjuvant chemo with FEC Taxol  right lumpectomy, radiation, adjuvant Zoladex  and exemestane  started 07/03/2013 ------------------------------------------------------------------------------  Breast MRI 04/22/2017: 2.3 cm malignancy lower outer quadrant right breast within the lumpectomy scar   Recommendation: 1.  Neoadjuvant chemotherapy with carboplatin  and gemcitabine  day 1 and 8 every 3 weeks for 6 cycles 2. followed by mastectomy 3.  Followed by continued antiestrogen therapy  PET/CT scan: Negative for distant metastasis -------------------------------------------------------------------------------- 10/05/2017:Right mastectomy: IDC grade 3, 3 cm, with high-grade DCIS, margins negative, 0/2 lymph nodes negative, ER 10%, PR 0%, HER-2 negative, Ki-67 30% ypT2ypN0  11/08/2017: Hysterectomy and bilateral salpingo-oophorectomy: Benign   Current treatment: Exemestane  25 mg daily restarted 10/25/2017 x 5 years completed May 2024    Assessment and Plan Assessment & Plan History of right breast invasive ductal carcinoma with recurrence, status post treatment Initially Stage 2A ER, PR positive. Treated with neoadjuvant chemotherapy, lumpectomy, radiation, and antiestrogen therapy. Recurrence treated with chemotherapy, mastectomy, and Aromasin . Recent biopsy negative for malignancy. No current signs of recurrence. - Continue annual mammograms for the left breast. -Counseled patient on healthy diet and exercise  PTEN genetic variant of uncertain significance.  Genetic counseling looking into whether variant has been reclassified.   - Reach out to patient after we hear back from genetic testing company next week.    RTC in 1 year for continued long term follow up.

## 2024-09-22 ENCOUNTER — Encounter: Admitting: Adult Health
